# Patient Record
Sex: Female | Born: 1940 | Race: White | Hispanic: No
Health system: Southern US, Community
[De-identification: ages and names within clinical notes are randomized; demographics above are authoritative.]

## PROBLEM LIST (undated history)

## (undated) DIAGNOSIS — I499 Cardiac arrhythmia, unspecified: Secondary | ICD-10-CM

## (undated) DIAGNOSIS — I10 Essential (primary) hypertension: Secondary | ICD-10-CM

## (undated) DIAGNOSIS — K219 Gastro-esophageal reflux disease without esophagitis: Secondary | ICD-10-CM

## (undated) DIAGNOSIS — R112 Nausea with vomiting, unspecified: Secondary | ICD-10-CM

## (undated) DIAGNOSIS — Z8679 Personal history of other diseases of the circulatory system: Secondary | ICD-10-CM

## (undated) DIAGNOSIS — Z9889 Other specified postprocedural states: Secondary | ICD-10-CM

## (undated) DIAGNOSIS — E039 Hypothyroidism, unspecified: Secondary | ICD-10-CM

## (undated) DIAGNOSIS — E119 Type 2 diabetes mellitus without complications: Secondary | ICD-10-CM

## (undated) DIAGNOSIS — I4819 Other persistent atrial fibrillation: Secondary | ICD-10-CM

## (undated) DIAGNOSIS — E7800 Pure hypercholesterolemia, unspecified: Secondary | ICD-10-CM

## (undated) DIAGNOSIS — M545 Low back pain, unspecified: Secondary | ICD-10-CM

## (undated) DIAGNOSIS — F419 Anxiety disorder, unspecified: Secondary | ICD-10-CM

## (undated) DIAGNOSIS — I509 Heart failure, unspecified: Secondary | ICD-10-CM

## (undated) DIAGNOSIS — I519 Heart disease, unspecified: Secondary | ICD-10-CM

## (undated) DIAGNOSIS — G4733 Obstructive sleep apnea (adult) (pediatric): Secondary | ICD-10-CM

## (undated) DIAGNOSIS — F329 Major depressive disorder, single episode, unspecified: Secondary | ICD-10-CM

## (undated) DIAGNOSIS — I350 Nonrheumatic aortic (valve) stenosis: Secondary | ICD-10-CM

## (undated) DIAGNOSIS — G2581 Restless legs syndrome: Secondary | ICD-10-CM

## (undated) DIAGNOSIS — I428 Other cardiomyopathies: Secondary | ICD-10-CM

## (undated) DIAGNOSIS — R5383 Other fatigue: Secondary | ICD-10-CM

## (undated) DIAGNOSIS — M1990 Unspecified osteoarthritis, unspecified site: Secondary | ICD-10-CM

## (undated) DIAGNOSIS — Z9989 Dependence on other enabling machines and devices: Secondary | ICD-10-CM

## (undated) DIAGNOSIS — C4362 Malignant melanoma of left upper limb, including shoulder: Secondary | ICD-10-CM

## (undated) DIAGNOSIS — F32A Depression, unspecified: Secondary | ICD-10-CM

## (undated) DIAGNOSIS — G8929 Other chronic pain: Secondary | ICD-10-CM

## (undated) HISTORY — DX: Heart disease, unspecified: I51.9

## (undated) HISTORY — DX: Nonrheumatic aortic (valve) stenosis: I35.0

## (undated) HISTORY — DX: Other fatigue: R53.83

## (undated) HISTORY — DX: Other cardiomyopathies: I42.8

## (undated) HISTORY — DX: Restless legs syndrome: G25.81

## (undated) HISTORY — DX: Essential (primary) hypertension: I10

## (undated) HISTORY — PX: MELANOMA EXCISION: SHX5266

## (undated) HISTORY — DX: Hypothyroidism, unspecified: E03.9

## (undated) HISTORY — PX: DILATION AND CURETTAGE OF UTERUS: SHX78

## (undated) HISTORY — PX: CATARACT EXTRACTION W/ INTRAOCULAR LENS  IMPLANT, BILATERAL: SHX1307

---

## 1983-10-08 HISTORY — PX: VAGINAL HYSTERECTOMY: SUR661

## 1998-07-24 ENCOUNTER — Ambulatory Visit (HOSPITAL_COMMUNITY): Admission: RE | Admit: 1998-07-24 | Discharge: 1998-07-24 | Payer: Self-pay | Admitting: Family Medicine

## 1999-06-05 ENCOUNTER — Other Ambulatory Visit: Admission: RE | Admit: 1999-06-05 | Discharge: 1999-06-05 | Payer: Self-pay | Admitting: Family Medicine

## 1999-07-27 ENCOUNTER — Encounter: Payer: Self-pay | Admitting: Family Medicine

## 1999-07-27 ENCOUNTER — Ambulatory Visit (HOSPITAL_COMMUNITY): Admission: RE | Admit: 1999-07-27 | Discharge: 1999-07-27 | Payer: Self-pay | Admitting: Family Medicine

## 2000-06-11 ENCOUNTER — Other Ambulatory Visit: Admission: RE | Admit: 2000-06-11 | Discharge: 2000-06-11 | Payer: Self-pay | Admitting: Family Medicine

## 2001-06-26 ENCOUNTER — Other Ambulatory Visit: Admission: RE | Admit: 2001-06-26 | Discharge: 2001-06-26 | Payer: Self-pay | Admitting: Family Medicine

## 2002-06-18 ENCOUNTER — Other Ambulatory Visit: Admission: RE | Admit: 2002-06-18 | Discharge: 2002-06-18 | Payer: Self-pay | Admitting: Family Medicine

## 2003-04-07 ENCOUNTER — Ambulatory Visit (HOSPITAL_COMMUNITY): Admission: RE | Admit: 2003-04-07 | Discharge: 2003-04-07 | Payer: Self-pay | Admitting: Gastroenterology

## 2003-07-13 ENCOUNTER — Ambulatory Visit (HOSPITAL_COMMUNITY): Admission: RE | Admit: 2003-07-13 | Discharge: 2003-07-13 | Payer: Self-pay | Admitting: Family Medicine

## 2003-07-13 ENCOUNTER — Encounter: Payer: Self-pay | Admitting: Cardiology

## 2004-07-10 ENCOUNTER — Ambulatory Visit (HOSPITAL_BASED_OUTPATIENT_CLINIC_OR_DEPARTMENT_OTHER): Admission: RE | Admit: 2004-07-10 | Discharge: 2004-07-10 | Payer: Self-pay | Admitting: Family Medicine

## 2004-07-10 ENCOUNTER — Encounter: Payer: Self-pay | Admitting: Internal Medicine

## 2004-08-28 ENCOUNTER — Ambulatory Visit: Payer: Self-pay | Admitting: Pulmonary Disease

## 2004-10-07 ENCOUNTER — Emergency Department (HOSPITAL_COMMUNITY): Admission: AD | Admit: 2004-10-07 | Discharge: 2004-10-07 | Payer: Self-pay | Admitting: Family Medicine

## 2005-02-25 ENCOUNTER — Ambulatory Visit: Payer: Self-pay | Admitting: Pulmonary Disease

## 2005-06-21 ENCOUNTER — Ambulatory Visit: Payer: Self-pay | Admitting: Pulmonary Disease

## 2005-12-19 ENCOUNTER — Ambulatory Visit: Payer: Self-pay | Admitting: Pulmonary Disease

## 2006-06-05 ENCOUNTER — Encounter: Admission: RE | Admit: 2006-06-05 | Discharge: 2006-06-05 | Payer: Self-pay | Admitting: Family Medicine

## 2006-08-06 ENCOUNTER — Encounter: Admission: RE | Admit: 2006-08-06 | Discharge: 2006-08-06 | Payer: Self-pay | Admitting: Family Medicine

## 2006-09-18 ENCOUNTER — Encounter: Admission: RE | Admit: 2006-09-18 | Discharge: 2006-09-18 | Payer: Self-pay | Admitting: Family Medicine

## 2006-10-24 ENCOUNTER — Encounter: Admission: RE | Admit: 2006-10-24 | Discharge: 2006-10-24 | Payer: Self-pay | Admitting: Family Medicine

## 2006-12-19 ENCOUNTER — Ambulatory Visit: Payer: Self-pay | Admitting: Pulmonary Disease

## 2007-01-30 ENCOUNTER — Encounter: Admission: RE | Admit: 2007-01-30 | Discharge: 2007-01-30 | Payer: Self-pay | Admitting: Family Medicine

## 2007-06-09 ENCOUNTER — Ambulatory Visit: Payer: Self-pay | Admitting: Pulmonary Disease

## 2008-02-05 ENCOUNTER — Telehealth (INDEPENDENT_AMBULATORY_CARE_PROVIDER_SITE_OTHER): Payer: Self-pay | Admitting: *Deleted

## 2008-06-20 ENCOUNTER — Ambulatory Visit: Payer: Self-pay | Admitting: Pulmonary Disease

## 2008-06-20 DIAGNOSIS — G4733 Obstructive sleep apnea (adult) (pediatric): Secondary | ICD-10-CM | POA: Insufficient documentation

## 2008-06-20 DIAGNOSIS — G2581 Restless legs syndrome: Secondary | ICD-10-CM | POA: Insufficient documentation

## 2008-07-06 ENCOUNTER — Encounter: Payer: Self-pay | Admitting: Pulmonary Disease

## 2008-09-12 ENCOUNTER — Ambulatory Visit: Payer: Self-pay | Admitting: Pulmonary Disease

## 2008-11-09 ENCOUNTER — Encounter: Admission: RE | Admit: 2008-11-09 | Discharge: 2008-11-09 | Payer: Self-pay | Admitting: Family Medicine

## 2009-06-19 ENCOUNTER — Ambulatory Visit: Payer: Self-pay | Admitting: Pulmonary Disease

## 2009-09-04 ENCOUNTER — Encounter: Payer: Self-pay | Admitting: Pulmonary Disease

## 2009-11-29 ENCOUNTER — Telehealth: Payer: Self-pay | Admitting: Pulmonary Disease

## 2010-02-09 ENCOUNTER — Encounter: Admission: RE | Admit: 2010-02-09 | Discharge: 2010-02-09 | Payer: Self-pay | Admitting: Family Medicine

## 2010-06-28 ENCOUNTER — Telehealth: Payer: Self-pay | Admitting: Pulmonary Disease

## 2010-07-23 ENCOUNTER — Ambulatory Visit: Payer: Self-pay | Admitting: Pulmonary Disease

## 2010-07-24 LAB — CONVERTED CEMR LAB
Iron: 74 ug/dL (ref 42–145)
Saturation Ratios: 16.1 % — ABNORMAL LOW (ref 20.0–50.0)
Transferrin: 328.2 mg/dL (ref 212.0–360.0)

## 2010-08-30 ENCOUNTER — Encounter: Payer: Self-pay | Admitting: Pulmonary Disease

## 2010-11-08 NOTE — Miscellaneous (Signed)
Summary: negative apnea link screen  Clinical Lists Changes   apnea link shows ahi 5/hr, lowest sat 90% can go without cpap and see how she feels.  Appended Document: negative apnea link screen megan, let pt know that only 5/hr on apnea link.  She can come off cpap for a period of time and see how she feels.  let me know  Appended Document: negative apnea link screen called and spoke with pt.  informed pt of apnea link results and dr. Theodora Lalanne's recommendations that she can come off cpap for a period of time and to see how she feels.  informed pt to call our office and let us know how she is doing once off cpap.  pt verbalized understanding and denied any questions.

## 2010-11-08 NOTE — Progress Notes (Signed)
Summary: rx  Phone Note Call from Patient Call back at Home Phone [phone]   Caller: Patient Call For: clance Reason for Call: Talk to Nurse Summary of Call: kc gave pt Mirapex samples at last visit...need rx called in to [facility]. Initial call taken by: Eugene Gavia,  Feb 05, 2008 12:03 PM  Follow-up for Phone Call        per kc ok to call in mirapex but offer requip first because it comes in generic-requip 1mg  1 at bedtime #30 with as needed refills  lmtcb Philipp Deputy CMA  Feb 05, 2008 1:31 PM   Additional Follow-up for Phone Call Additional follow up Details #1::        Spoke with patient - she wants mirapex - dosage?? Marinus Maw  Feb 05, 2008 2:35 PM     New/Updated Medications: MIRAPEX 0.25 MG  TABS (PRAMIPEXOLE DIHYDROCHLORIDE) One tablet at bedtime.   Prescriptions: MIRAPEX 0.25 MG  TABS (PRAMIPEXOLE DIHYDROCHLORIDE) One tablet at bedtime.  #30 x 6   Entered by:   Marinus Maw   Authorized by:   Barbaraann Share MD   Signed by:   Marinus Maw on 02/05/2008   Method used:   Electronically sent to ...       [id number]       [phone]       [place], [place]  [address]       Ph: [phone]       Fax: [phone]   RxID:   [id number]

## 2010-11-08 NOTE — Progress Notes (Signed)
Summary: refill  Phone Note Call from Patient Call back at Home Phone [phone]   Caller: Patient Call For: [name] Reason for Call: Refill Medication Summary of Call: Wants refill on requip 1 mg (ropinirole hcl) , was getting it through [employer] (mail order), [facility] to [facility]//[phone]. Initial call taken by: Darletta Moll,  November 29, 2009 9:51 AM  Follow-up for Phone Call        pt requested 30 day supply with refills. rx sent. pt aware.  Carron Curie CMA  November 29, 2009 9:57 AM     Prescriptions: REQUIP 1 MG  TABS (ROPINIROLE HCL) At bedtime  #30 x 5   Entered by:   Carron Curie CMA   Authorized by:   Barbaraann Share MD   Signed by:   Carron Curie CMA on 11/29/2009   Method used:   [facility] [address] [facility]       [address]       [place], [place]  [address]       Ph: [phone] or [phone]       Fax: [phone]   RxID:   [id number]

## 2010-11-08 NOTE — Miscellaneous (Signed)
Summary: H1N1 Vaccine  Clinical Lists Changes  Orders: Added new Service order of H1N1 vaccine (E4540) - Signed Added new Service order of Influenza A (H1N1) w/ Phy couseling (J8119) - Signed Observations: Added new observation of H1N1 #1 VIS: 07/07/2008 (09/12/2008 14:29) Added new observation of H1N1 #1 EXP: 04/05/2009 (09/12/2008 14:29) Added new observation of H1N1 #1 LOT: [id number] (09/12/2008 14:29) Added new observation of H1N1 #1 MFR: [employer] (09/12/2008 14:29) Added new observation of H1N1 #1 RTE: IM (09/12/2008 14:29) Added new observation of H1N1 #1 SITE: Deltoid (09/12/2008 14:29) Added new observation of H1N1#1 VAC: Given (09/12/2008 14:29)Flu Vaccine Consent Questions     Do you have a history of severe allergic reactions to this vaccine? no    Any prior history of allergic reactions to egg and/or gelatin? no    Do you have a sensitivity to the preservative Thimersol? no    Do you have a past history of Guillan-Barre Syndrome? no    Do you currently have an acute febrile illness? no    Have you ever had a severe reaction to latex? no    Vaccine information given and explained to patient? yes    Are you currently pregnant? no   Do you have Asthma? no   Lot Number: [id number]   Exp Date:04/05/2009   Site Given  Deltoid R Cyndia Diver LPN  September 12, 2008 2:29 PM Added new observation of H1N1 #1 VIS: 07/07/2008 (09/12/2008 14:29) Added new observation of H1N1 #1 EXP: 04/05/2009 (09/12/2008 14:29) Added new observation of H1N1 #1 LOT: [id number] (09/12/2008 14:29) Added new observation of H1N1 #1 MFR: [employer]osmithkline (09/12/2008 14:29) Added new observation of H1N1 #1 RTE: IM (09/12/2008 14:29) Added new observation of H1N1 #1 SITE: Deltoid (09/12/2008 14:29) Added new observation of H1N1#1 VAC: Given (09/12/2008 14:29)  .h1n1

## 2010-11-08 NOTE — Miscellaneous (Signed)
Summary: optimal pressure is 12cm.  Clinical Lists Changes  Orders: Added new Referral order of DME Referral (DME) - Signed autodownload shows optimal pressure of 12-13, excellent compliance.

## 2010-11-08 NOTE — Assessment & Plan Note (Signed)
Summary: rov for osa, rls   Primary Provider/Referring Provider:  Hilts  CC:  1 year follow up, pt c/o leg jerks at night , requip not working, and compliant with cpap averages 6-8 hrs.  History of Present Illness: the pt comes in today for f/u of her known osa and RLS.  She is doing very well with cpap, and reports no issues.  She feels that it has helped her sleep and daytime alertness.  She is having no issue with mask or machine.  She also has RLS, and is being treated with requip.  She has been having breakthru leg symptoms, and they do sound like typical RLS symptoms.  She has not had a recent iron panel.   Preventive Screening-Counseling & Management  Alcohol-Tobacco     Smoking Status: never  Allergies: No Known Drug Allergies  Social History: Smoking Status:  never  Review of Systems       The patient complains of non-productive cough, anxiety, depression, and hand/feet swelling.  The patient denies shortness of breath with activity, shortness of breath at rest, productive cough, coughing up blood, chest pain, irregular heartbeats, acid heartburn, indigestion, loss of appetite, weight change, abdominal pain, difficulty swallowing, sore throat, tooth/dental problems, headaches, nasal congestion/difficulty breathing through nose, sneezing, itching, ear ache, joint stiffness or pain, rash, change in color of mucus, and fever.    Vital Signs:  Patient profile:   70 year old female Height:      66 inches Weight:      183 pounds BMI:     29.64 O2 Sat:      96 % on Room air Temp:     98.4 degrees F oral Pulse rate:   57 / minute BP sitting:   122 / 64  (left arm)  Vitals Entered By: Renold Genta RCP, LPN (July 23, 2010 2:42 PM)  O2 Flow:  Room air CC: 1 year follow up, pt c/o leg jerks at night , requip not working, compliant with cpap averages 6-8 hrs Comments Medications reviewed with patient Renold Genta RCP, LPN  July 23, 2010 2:47 PM    Physical  Exam  General:  ow female in nad Nose:  no skin breakdown or pressure necrosis from cpap mask Extremities:  no edema or cyanosis  Neurologic:  alert and oriented, does not appear sleepy. moves all 4 without deficit.   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  I have asked her to work on weight loss, and to keep up with mask changes and supplies.    Problem # 2:  RESTLESS LEGS SYNDROME (ICD-333.94) the pt is having breakthru leg symptoms despite taking requip, and her description does sound like RLS.  Will check an iron panel to see if that is the issue, and also try increasing her dopamine agonist dose.  She will let me know how things go.  Medications Added to Medication List This Visit: 1)  Requip 0.5 Mg Tabs (Ropinirole hcl) .... As directed 2)  Arthrotec 75-200 Mg-mcg Tabs (Diclofenac-misoprostol) .[name].. 1 two times a day  Other Orders: Est. Patient Level III (99213) TLB-IBC Pnl (Iron/FE;Transferrin) (83550-IBC)  Patient Instructions: 1)  increase requip to 0.5mg  in afternoon, then 1mg  after dinner.  if still having issues, increase to 1mg  in afternoon and after dinner.  let me know if still having issues. 2)  will check iron level today  3)  continue on cpap.   4)  followup with me in one year  Prescriptions: REQUIP 0.5 MG  TABS (ROPINIROLE HCL) as directed  #120 x 6   Entered and Authorized by:   Barbaraann Share MD   Signed by:   Barbaraann Share MD on 07/23/2010   Method used:   Print then Give to Patient   RxID:   [id number]

## 2010-11-08 NOTE — Assessment & Plan Note (Signed)
Summary: rov for osa, rls   Primary Provider/Referring Provider:  Hilts  CC:  Pt is here for a 1 yr f/u appt.  Pt states she is not using her cpap machine every night.  pt c/o pressure too high.  Pt denied any complaints with mask.  Pt c/o Requip causing her to feel "drugged up" in the morning.  [name]  History of Present Illness: The pt comes in today for f/u of her known mild osa and RLS.  She is taking requip for the latter, and her symptoms seem to be well controlled.  However, she is concerned the requip is causing "hangover effect" the next day.  She has tried taking 1/2 dose, and does better wrt sedation the next day.  She is wearing cpap off and on, but feels the  pressure is too high.  It has not been checked in awhile.  At the last visit, because her "apnea link" showed improved osa from her original study, I had asked her to come off cpap for awhile to see how she did.  She felt she did better with continuing on cpap.  Of note, her weight is up 15 pounds from last visit.  Current Medications (verified): 1)  Adult Aspirin Ec Low Strength 81 Mg Tbec (Aspirin) .... Once Daily 2)  Women's One A Day Multivitamin .... Once Daily 3)  Levothroid 112 Mcg Tabs (Levothyroxine Sodium) .... Once Daily 4)  Daypro 500 Mg Tabs (Oxaprozin) .... Take 1 Tablet By Mouth Two Times A Day 5)  Omeprazole 20 Mg Cpdr (Omeprazole) .... Take 1 Tablet By Mouth Two Times A Day 6)  Lovastatin 20 Mg Tabs (Lovastatin) .... Take 1 Tablet By Mouth Once A Day 7)  Fish Oil 1200 Mg Caps (Omega-3 Fatty Acids) .... Take 2 Tabs By Mouth Two Times A Day 8)  Cod Liver Oil  Caps (Cod Liver Oil) .... Take 3 Tabs By Mouth Daily 9)  Calcium .... Take 1 Tablet By Mouth Once A Day 10)  Acarbose 25 Mg Tabs (Acarbose) .... Take 1 Tablet By Mouth Three Times A Day 11)  Requip 1 Mg  Tabs (Ropinirole Hcl) .... At Bedtime 12)  Magnesium With Zinc .... 3 Tabs Two Times A Day  Allergies (verified): No Known Drug Allergies  Review of  Systems      See HPI  Vital Signs:  Patient profile:   70 year old female Height:      66 inches Weight:      185.13 pounds BMI:     29.99 O2 Sat:      100 % on Room air Temp:     97.8 degrees F oral Pulse rate:   56 / minute BP sitting:   130 / 70  (left arm) Cuff size:   regular  Vitals Entered By: Arman Filter LPN (June 19, 2009 1:30 PM)  O2 Flow:  Room air CC: Pt is here for a 1 yr f/u appt.  Pt states she is not using her cpap machine every night.  pt c/o pressure too high.  Pt denied any complaints with mask.  Pt c/o Requip causing her to feel "drugged up" in the morning.   Comments Medications reviewed with patient Arman Filter LPN  June 19, 2009 1:30 PM    Physical Exam  General:  ow female in nad Nose:  no skin breakdown or pressure necrosis from cpap mask Extremities:  no edema Neurologic:  alert, does not appear sleepy, moves all 4.  Impression & Recommendations:  Problem # 1:  RESTLESS LEGS SYNDROME (ICD-333.94) I have asked the pt to cut her requip in half to 0.5mg  at hs.  If she continues to have issues, would consider changing to mirapex, which has recently gone generic as well.  Problem # 2:  OBSTRUCTIVE SLEEP APNEA (ICD-327.23) She has not been wearing cpap compliantly, primarily due to "too much pressure".  I suspect her pressure needs haven't changed, but will recheck with autodevice for 2 weeks.  Medications Added to Medication List This Visit: 1)  Women's One A Day Multivitamin  .... Once daily 2)  Omeprazole 20 Mg Cpdr (Omeprazole) .... Take 1 tablet by mouth two times a day 3)  Fish Oil 1200 Mg Caps (Omega-3 fatty acids) .... Take 2 tabs by mouth two times a day 4)  Magnesium With Zinc  .... 3 tabs two times a day  Other Orders: Est. Patient Level III (16109) DME Referral (DME)  Patient Instructions: 1)  try cutting requip dose in half.  If symptoms worsen, or if you remain oversedated on the 0.5mg  dose, can consider changing you  to mirapex. 2)  will get auto titrating device for 2 weeks to re-optimize your pressure 3)  work on weight loss. 4)  followup with me in 12mos.

## 2010-11-08 NOTE — Assessment & Plan Note (Signed)
Summary: rov for osa and rls   Chief Complaint:  Pt is here for a yearly follow up.  Pt states she is not using her cpap machine at night.  Pt would like Rx for Requip instead of Mirapex due to costs..  History of Present Illness: the patient comes in today for followup of her obstructive sleep apnea and restless leg syndrome. She has lost about 35 pounds since the last visit, and is wondering whether she even still has sleep apnea. She has not been wearing her CPAP for the last month or so, and feels that she sleeps just as well without it. However, she has noted some sluggishness in the morning, and is wondering whether this has anything to do with her dopamine agonist for her restless leg syndrome. I have suggested to her that it may be related to discontinuation of CPAP. She is having no difficulties with her mask or pressure, and is having no breakthrough leg jerks on her dopamine agonist.        Review of Systems      See HPI   Vital Signs:  Patient Profile:   70 Years Old Female Weight:      170 pounds O2 Sat:      98 % O2 treatment:    Room Air Temp:     98.0 degrees F oral Pulse rate:   68 / minute BP sitting:   138 / 70  (right arm) Cuff size:   regular  Vitals Entered By: Cyndia Diver LPN (June 20, 2008 1:38 PM)             Comments Medications reviewed with patient Cyndia Diver LPN  June 20, 2008 1:38 PM      Physical Exam  General:     overweight female in no acute distress Nose:     no skin breakdown or pressure necrosis from the CPAP mask      Impression & Recommendations:  Problem # 1:  OBSTRUCTIVE SLEEP APNEA (ICD-327.23) the patient has lost 35 pounds, and may be able to go without CPAP. I have suggested to her that we try an apnea link device as a screening tool.  I will call her with results.  Problem # 2:  RESTLESS LEGS SYNDROME (ICD-333.94) controlled on meds.  I doubt her dopamine agonist is responsible for her sluggishness in  the am's.  Will see what the apnea link shows.  Medications Added to Medication List This Visit: 1)  Daypro 500 Mg Tabs (oxaprozin)  .... Take 1 tablet by mouth two times a day 2)  Detrol La 4 Mg Xr24h-cap (Tolterodine tartrate) .... Take 1 tablet by mouth once a day 3)  Lovastatin 20 Mg Tabs (Lovastatin) .... Take 1 tablet by mouth once a day 4)  Fish Oil 1200 Mg Caps (Omega-3 fatty acids) .... Take 1 tablet by mouth two times a day 5)  Cod Liver Oil Caps (Cod liver oil) .... Take 3 tabs by mouth daily 6)  Glucosamine-chondroitin  .... Take 1 tablet by mouth two times a day 7)  Calcium  .... Take 1 tablet by mouth once a day 8)  Niacin 500 Mg Tabs (Niacin) .... Take 1 tablet by mouth once a day 9)  Acarbose 25 Mg Tabs (Acarbose) .... Take 1 tablet by mouth three times a day 10)  Requip 1 Mg Tabs (Ropinirole hcl) .... At bedtime   Patient Instructions: 1)  will get apnea link to check for one night. 2)  will change mirapex to requip 1mg  for expense control 3)  f/u one year and as needed.   Prescriptions: REQUIP 1 MG  TABS (ROPINIROLE HCL) At bedtime  #30 x 6   Entered and Authorized by:   Barbaraann Share MD   Signed by:   Barbaraann Share MD on 06/20/2008   Method used:   Print then Give to Patient   RxID:   [id number]  ]

## 2010-11-08 NOTE — Letter (Signed)
Summary: CMN for PAP Device/[facility]  CMN for PAP Device/[facility]   Imported By: Sherian Rein 09/07/2010 08:29:26  _____________________________________________________________________  External Attachment:    Type:   Image     Comment:   External Document

## 2010-11-08 NOTE — Progress Notes (Signed)
Summary: nos appt  Phone Note Call from Patient   Caller: [email or web address]  Call For: Annabeth Tortora Summary of Call: Rsc nos from 9/21 to 10/17 @ 2:45p. Initial call taken by: Darletta Moll,  June 28, 2010 10:02 AM

## 2011-01-17 ENCOUNTER — Other Ambulatory Visit: Payer: Self-pay | Admitting: Family Medicine

## 2011-01-17 DIAGNOSIS — Z1231 Encounter for screening mammogram for malignant neoplasm of breast: Secondary | ICD-10-CM

## 2011-02-11 ENCOUNTER — Ambulatory Visit: Payer: Self-pay

## 2011-02-12 ENCOUNTER — Ambulatory Visit
Admission: RE | Admit: 2011-02-12 | Discharge: 2011-02-12 | Disposition: A | Discharge status: Home / Self Care | Payer: Medicare Other | Source: Ambulatory Visit | Attending: Family Medicine | Admitting: Family Medicine

## 2011-02-12 DIAGNOSIS — Z1231 Encounter for screening mammogram for malignant neoplasm of breast: Secondary | ICD-10-CM

## 2011-02-22 NOTE — Op Note (Signed)
   NAMEKIARALIZ, RAFUSE                            ACCOUNT NO.:  [id number]   MEDICAL RECORD NO.:  [id number]                   PATIENT TYPE:  AMB   LOCATION:  ENDO                                 FACILITY:  [facility]   PHYSICIAN:  Graylin Shiver, M.D.                DATE OF BIRTH:  [birth year]   DATE OF PROCEDURE:  04/07/2003  DATE OF DISCHARGE:                                 OPERATIVE REPORT   PROCEDURE:  Colonoscopy.   INDICATIONS FOR PROCEDURE:  Screening.   CONSENT:  Informed consent was obtained after explanation of the risks of  bleeding, infection, perforation.   PREMEDICATION:  Fentanyl 75 mcg IV, Versed 6 mg IV.   DESCRIPTION OF PROCEDURE:  With the patient in the left lateral decubitus  position, the rectal exam was performed.  No masses were felt.  The Olympus  colonoscope was inserted into the rectum and advanced around the colon to  the cecum.  Cecal landmarks were identified.  The cecum and ascending colon  were normal.  The transverse colon was normal.  The descending colon,  sigmoid and rectum were normal.  She tolerated the procedure well without  complications.   IMPRESSION:  Normal colonoscopy to the cecum.                                                 Graylin Shiver, M.D.    Germain Osgood  D:  04/07/2003  T:  04/07/2003  Job:  [id number]

## 2011-02-22 NOTE — Procedures (Signed)
[name], [name]                  ACCOUNT NO.:  [id number]   MEDICAL RECORD NO.:  [id number]          PATIENT TYPE:  OUT   LOCATION:  SLEEP CENTER                 FACILITY:  [facility]   PHYSICIAN:  Clinton D. Maple Hudson, M.D. DATE OF BIRTH:  May 25, 1941   DATE OF STUDY:  07/10/2004  DATE OF DISCHARGE:  07/10/2004                              NOCTURNAL POLYSOMNOGRAM   REFERRING PHYSICIAN:  Lillia Carmel, M.D.   INDICATION FOR THE STUDY:  Hypersomnia with sleep apnea.   EPWORTH SLEEPINESS SCORE:  15/24   NECK SIZE:  15 inches   BODY MASS INDEX:  30   WEIGHT:  192 pounds   SLEEP ARCHITECTURE:  Total sleep time 376 minutes with sleep efficiency 78%,  stage I was 9%, stage II 70%, stages III and IV 6%, REM was 15% of total  sleep time.  Sleep latency 52 minutes, REM latency 200 minutes, awake after  sleep onset 54 minutes, arousal index 53 which is increased reflecting  mostly leg jerks.  No sleep meds were taken.   RESPIRATORY DATA:  Split-study protocol.  RDI 17/hr indicating mild to  moderate obstructive sleep apnea/hypopnea syndrome before CPAP titration.  This included 1 obstructive apnea and 41 hypopneas.  Most events were while  sleeping supine.  REM RDI was absent.  CPAP was titrated to 11 CWP, RDI  1.1/hr using a Respironics ComfortGel Nasal Mask with humidifier.   OXYGEN DATA:  Moderate to loud snoring with desaturation to a nadir of 88%  before CPAP.  After CPAP titration mean oxygen saturation held 95% on room  air.   CARDIAC DATA:  Normal sinus rhythm with one interval of apparent PAT 7 beats  long occurring in stage II sleep.  Rhythm was otherwise unremarkable.   MOVEMENT/PARASOMNIA:  A total of 613 limb jerks were recorded of which 85  were associated with arousal or awakening for a periodic limb movement with  arousal index of 13.6/hr which is significantly elevated.   IMPRESSION:  Mild to moderate obstructive sleep apnea/hypopnea syndrome,  respiratory disturbance  index 17/hr with desaturation to 88%.  Successful  continuous positive airway pressure control at 11 CWP, respiratory  disturbance index 1.1/hr using a Respironics ComfortGel Nasal Mask with  heated humidifier.  Periodic limb  movement with arousal syndrome, 13.6/hr.  This may benefit from a trial of  specific therapy with clonazepam or Requip.      CDY/MEDQ  D:  07/15/2004 10:08:44  T:  07/15/2004 74:25:95  Job:  [id number]

## 2011-05-08 ENCOUNTER — Other Ambulatory Visit: Payer: Self-pay | Admitting: Pulmonary Disease

## 2011-05-10 ENCOUNTER — Telehealth: Payer: Self-pay | Admitting: Pulmonary Disease

## 2011-05-10 MED ORDER — ROPINIROLE HCL 0.5 MG PO TABS: ORAL_TABLET | ORAL | Status: DC

## 2011-05-10 NOTE — Telephone Encounter (Signed)
Ok to send refills for a year.

## 2011-05-10 NOTE — Telephone Encounter (Signed)
KC please advise if ok to send refill for the requip---pt has appt with you in oct. 2012.

## 2011-05-10 NOTE — Telephone Encounter (Signed)
Requip Rx was last filled by [facility] for #120 x 6.  Rx called into [facility] -- pt aware.

## 2011-07-18 ENCOUNTER — Encounter: Payer: Self-pay | Admitting: Pulmonary Disease

## 2011-07-22 ENCOUNTER — Ambulatory Visit (INDEPENDENT_AMBULATORY_CARE_PROVIDER_SITE_OTHER): Payer: Medicare Other | Admitting: Pulmonary Disease

## 2011-07-22 ENCOUNTER — Encounter: Payer: Self-pay | Admitting: Pulmonary Disease

## 2011-07-22 DIAGNOSIS — G4733 Obstructive sleep apnea (adult) (pediatric): Secondary | ICD-10-CM

## 2011-07-22 DIAGNOSIS — Z23 Encounter for immunization: Secondary | ICD-10-CM

## 2011-07-22 DIAGNOSIS — G2581 Restless legs syndrome: Secondary | ICD-10-CM

## 2011-07-22 NOTE — Assessment & Plan Note (Signed)
The patient is doing well from a sleep apnea standpoint.  She is wearing her CPAP compliantly, and reports no issues with her mask fit or pressure.  I have encouraged her to work aggressively on weight loss.

## 2011-07-22 NOTE — Progress Notes (Signed)
  Subjective:    Patient ID: Jenna [name], female    DOB: December 18, 1940, 70 y.o.   MRN: [id number]  HPI The patient comes in today for followup of her known sleep apnea and restless leg syndrome.  She is wearing CPAP compliantly, and is having no issues with tolerance.  She feels that she is sleeping reasonably well, and has acceptable daytime alertness.  She is continuing on her dopamine agonists for her restless legs, but does occasionally have breakthrough which requires an extra dose.   Review of Systems  Constitutional: Negative for fever and unexpected weight change.  HENT: Negative for ear pain, nosebleeds, congestion, sore throat, rhinorrhea, sneezing, trouble swallowing, dental problem, postnasal drip and sinus pressure.   Eyes: Negative for redness and itching.  Respiratory: Negative for cough, chest tightness, shortness of breath and wheezing.   Cardiovascular: Positive for leg swelling. Negative for palpitations.  Gastrointestinal: Negative for nausea and vomiting.  Genitourinary: Negative for dysuria.  Musculoskeletal: Negative for joint swelling.  Skin: Negative for rash.  Neurological: Negative for headaches.  Hematological: Bruises/bleeds easily.  Psychiatric/Behavioral: Negative for dysphoric mood. The patient is not nervous/anxious.        Objective:   Physical Exam Overweight female in no acute distress No skin breakdown or pressure necrosis from the CPAP mask Lower extremities without edema, no cyanosis noted Alert, does not appear sleepy, moves all 4 extremities.       Assessment & Plan:

## 2011-07-22 NOTE — Assessment & Plan Note (Signed)
The patient is on Requip for her restless leg syndrome, and is doing fairly well overall.  I stressed the importance of exercise as an adjunct to drug therapy.

## 2011-07-22 NOTE — Patient Instructions (Signed)
Work on [employer] loss Continue with cpap No change in meds for RLS followup with me in one year Will give you a flu shot today.

## 2011-08-16 ENCOUNTER — Other Ambulatory Visit: Payer: Self-pay | Admitting: Pulmonary Disease

## 2011-08-16 DIAGNOSIS — G4733 Obstructive sleep apnea (adult) (pediatric): Secondary | ICD-10-CM

## 2012-01-21 ENCOUNTER — Other Ambulatory Visit: Payer: Self-pay | Admitting: Family Medicine

## 2012-01-21 DIAGNOSIS — Z1231 Encounter for screening mammogram for malignant neoplasm of breast: Secondary | ICD-10-CM

## 2012-02-13 ENCOUNTER — Ambulatory Visit: Payer: Medicare Other

## 2012-02-18 ENCOUNTER — Ambulatory Visit
Admission: RE | Admit: 2012-02-18 | Discharge: 2012-02-18 | Disposition: A | Discharge status: Home / Self Care | Payer: Medicare Other | Source: Ambulatory Visit | Attending: Family Medicine | Admitting: Family Medicine

## 2012-02-18 DIAGNOSIS — Z1231 Encounter for screening mammogram for malignant neoplasm of breast: Secondary | ICD-10-CM

## 2012-02-18 HISTORY — PX: TRANSTHORACIC ECHOCARDIOGRAM: SHX275

## 2012-04-20 ENCOUNTER — Other Ambulatory Visit: Payer: Self-pay | Admitting: Pulmonary Disease

## 2012-05-13 HISTORY — PX: OTHER SURGICAL HISTORY: SHX169

## 2012-07-21 ENCOUNTER — Encounter: Payer: Self-pay | Admitting: Pulmonary Disease

## 2012-07-21 ENCOUNTER — Ambulatory Visit (INDEPENDENT_AMBULATORY_CARE_PROVIDER_SITE_OTHER): Payer: Medicare Other | Admitting: Pulmonary Disease

## 2012-07-21 VITALS — BP 122/72 | HR 62 | Temp 98.1°F | Ht 66.5 in | Wt 207.2 lb

## 2012-07-21 DIAGNOSIS — G4733 Obstructive sleep apnea (adult) (pediatric): Secondary | ICD-10-CM

## 2012-07-21 DIAGNOSIS — G2581 Restless legs syndrome: Secondary | ICD-10-CM

## 2012-07-21 NOTE — Patient Instructions (Addendum)
Continue with cpap, and keep up with mask changes and supplies. Work on weight loss followup with me in one year if doing well.  

## 2012-07-21 NOTE — Assessment & Plan Note (Signed)
The patient feels that her leg movements are controlled as long as she sticks with her medications.  I have also stressed to her how exercise can help with RLS symptoms as well.

## 2012-07-21 NOTE — Progress Notes (Signed)
  Subjective:    Patient ID: Jenna [name], female    DOB: 05-28-1941, 71 y.o.   MRN: [id number]  HPI The patient comes in today for followup of her known obstructive sleep apnea.  She is wearing CPAP compliantly, and is having no issues with her mask fit or pressure.  She feels that she sleeps well with the device, and is satisfied with her daytime alertness.   Review of Systems  Constitutional: Negative for fever and unexpected weight change.  HENT: Negative for ear pain, nosebleeds, congestion, sore throat, rhinorrhea, sneezing, trouble swallowing, dental problem, postnasal drip and sinus pressure.   Eyes: Negative for redness and itching.  Respiratory: Negative for cough, chest tightness, shortness of breath and wheezing.   Cardiovascular: Positive for leg swelling. Negative for palpitations.  Gastrointestinal: Negative for nausea and vomiting.  Genitourinary: Negative for dysuria.  Musculoskeletal: Negative for joint swelling.  Skin: Negative for rash.  Neurological: Negative for headaches.  Hematological: Does not bruise/bleed easily.  Psychiatric/Behavioral: Positive for dysphoric mood ( loss of husband). The patient is not nervous/anxious.        Objective:   Physical Exam Overweight female in no acute distress No skin breakdown or pressure necrosis from the CPAP mask Lower extremities without edema, no cyanosis Alert, does not appear to be sleepy, moves all 4 extremities.       Assessment & Plan:

## 2012-07-21 NOTE — Assessment & Plan Note (Signed)
The patient is doing well with CPAP, and feels that it is helping her sleep and daytime alertness.  I've asked her to keep up with mask changes and supplies, and to work aggressively on weight loss.

## 2012-10-06 ENCOUNTER — Other Ambulatory Visit: Payer: Self-pay | Admitting: *Deleted

## 2012-10-06 MED ORDER — ROPINIROLE HCL 0.5 MG PO TABS: 0.5000 mg | ORAL_TABLET | ORAL | Status: DC

## 2013-01-11 ENCOUNTER — Other Ambulatory Visit: Payer: Self-pay

## 2013-01-11 DIAGNOSIS — Z1231 Encounter for screening mammogram for malignant neoplasm of breast: Secondary | ICD-10-CM

## 2013-02-22 ENCOUNTER — Ambulatory Visit: Payer: Medicare Other

## 2013-03-10 ENCOUNTER — Ambulatory Visit
Admission: RE | Admit: 2013-03-10 | Discharge: 2013-03-10 | Disposition: A | Discharge status: Home / Self Care | Payer: Medicare Other | Source: Ambulatory Visit

## 2013-03-10 DIAGNOSIS — Z1231 Encounter for screening mammogram for malignant neoplasm of breast: Secondary | ICD-10-CM

## 2013-03-13 ENCOUNTER — Other Ambulatory Visit: Payer: Self-pay | Admitting: Pulmonary Disease

## 2013-07-21 ENCOUNTER — Ambulatory Visit (INDEPENDENT_AMBULATORY_CARE_PROVIDER_SITE_OTHER): Payer: Medicare Other | Admitting: Pulmonary Disease

## 2013-07-21 ENCOUNTER — Encounter: Payer: Self-pay | Admitting: Pulmonary Disease

## 2013-07-21 VITALS — BP 118/62 | HR 56 | Temp 97.9°F | Ht 66.0 in | Wt 221.8 lb

## 2013-07-21 DIAGNOSIS — G4733 Obstructive sleep apnea (adult) (pediatric): Secondary | ICD-10-CM

## 2013-07-21 NOTE — Assessment & Plan Note (Signed)
The patient is doing well overall with CPAP, but she has gained 14 pounds since the last visit and thinks she may be having breakthrough events.  She recently got new nasal pillows.  We will use the automatic setting and her device to optimize her pressure again, and we'll let her know the optimal pressure once we receive her download.  I have also stressed to her the importance of aggressive weight loss.

## 2013-07-21 NOTE — Progress Notes (Signed)
  Subjective:    Patient ID: Jenna [name], female    DOB: September 27, 1941, 72 y.o.   MRN: [id number]  HPI Patient comes in today for followup of her obstructive sleep apnea.  She is wearing CPAP compliantly, and is having no issues with her mask fit or pressure.  She does think that she is having breakthrough events at times, and her weight is up 14 pounds from the last visit.  She feels that her alertness during the day however is adequate.   Review of Systems  Constitutional: Negative for fever and unexpected weight change.  HENT: Negative for congestion, dental problem, ear pain, nosebleeds, postnasal drip, rhinorrhea, sinus pressure, sneezing, sore throat and trouble swallowing.   Eyes: Negative for redness and itching.  Respiratory: Positive for shortness of breath ( with exertion). Negative for cough, chest tightness and wheezing.   Cardiovascular: Negative for palpitations and leg swelling.  Gastrointestinal: Negative for nausea and vomiting.  Genitourinary: Negative for dysuria.  Musculoskeletal: Negative for joint swelling.  Skin: Negative for rash.  Neurological: Negative for headaches.  Hematological: Does not bruise/bleed easily.  Psychiatric/Behavioral: Negative for dysphoric mood. The patient is not nervous/anxious.        Objective:   Physical Exam Overweight female in no acute distress Nose without purulence or discharge noted No skin breakdown or pressure necrosis from the CPAP mask Lower extremities with mild edema, no cyanosis Alert, does not appear to be sleepy, moves all 4 extremities.       Assessment & Plan:

## 2013-07-21 NOTE — Patient Instructions (Signed)
Continue with cpap, and keep up with cushion changes. Work on [employer] loss and exercise program Will put your machine on the auto setting for a few weeks, and will call you once I receive your download.  followup with me in one year if doing well.

## 2013-07-22 ENCOUNTER — Encounter: Payer: Self-pay | Admitting: Internal Medicine

## 2013-07-22 ENCOUNTER — Ambulatory Visit (INDEPENDENT_AMBULATORY_CARE_PROVIDER_SITE_OTHER): Payer: Medicare Other | Admitting: Internal Medicine

## 2013-07-22 VITALS — BP 146/70 | HR 60 | Ht 66.5 in | Wt 224.8 lb

## 2013-07-22 DIAGNOSIS — I350 Nonrheumatic aortic (valve) stenosis: Secondary | ICD-10-CM

## 2013-07-22 DIAGNOSIS — I359 Nonrheumatic aortic valve disorder, unspecified: Secondary | ICD-10-CM

## 2013-07-22 DIAGNOSIS — E039 Hypothyroidism, unspecified: Secondary | ICD-10-CM

## 2013-07-22 DIAGNOSIS — I480 Paroxysmal atrial fibrillation: Secondary | ICD-10-CM

## 2013-07-22 DIAGNOSIS — I10 Essential (primary) hypertension: Secondary | ICD-10-CM

## 2013-07-22 DIAGNOSIS — I4891 Unspecified atrial fibrillation: Secondary | ICD-10-CM

## 2013-07-22 NOTE — Patient Instructions (Signed)
Your physician wants you to follow-up in: 1 year. You will receive a reminder letter in the mail two months in advance. If you don't receive a letter, please call our office to schedule the follow-up appointment.  Please have your lab work faxed to our office [phone])

## 2013-07-26 ENCOUNTER — Encounter: Payer: Self-pay | Admitting: Internal Medicine

## 2013-07-26 DIAGNOSIS — I359 Nonrheumatic aortic valve disorder, unspecified: Secondary | ICD-10-CM | POA: Insufficient documentation

## 2013-07-26 DIAGNOSIS — E039 Hypothyroidism, unspecified: Secondary | ICD-10-CM | POA: Insufficient documentation

## 2013-07-26 DIAGNOSIS — I350 Nonrheumatic aortic (valve) stenosis: Secondary | ICD-10-CM | POA: Insufficient documentation

## 2013-07-26 DIAGNOSIS — I480 Paroxysmal atrial fibrillation: Secondary | ICD-10-CM | POA: Insufficient documentation

## 2013-07-26 DIAGNOSIS — M1990 Unspecified osteoarthritis, unspecified site: Secondary | ICD-10-CM | POA: Insufficient documentation

## 2013-07-26 DIAGNOSIS — I10 Essential (primary) hypertension: Secondary | ICD-10-CM | POA: Insufficient documentation

## 2013-07-26 DIAGNOSIS — K219 Gastro-esophageal reflux disease without esophagitis: Secondary | ICD-10-CM | POA: Insufficient documentation

## 2013-07-26 DIAGNOSIS — F419 Anxiety disorder, unspecified: Secondary | ICD-10-CM | POA: Insufficient documentation

## 2013-07-26 NOTE — Progress Notes (Signed)
OFFICE NOTE  Chief Complaint:  Routine followup  Primary Care Physician: Eartha Inch, MD  HPI:  Jenna [name] is a 72 year old female with a history of mild aortic stenosis and a negative nuclear stress test in August 2013. Recently she had an episode of palpitations which she felt lasted up to about 10 minutes. During that time she felt a decrease in exercise tolerance with fatigue and no energy. This was concerning for AFib; however, she wore a monitor for a short period of time and it did not show that.  I set her up for a 30-day monitor, which she worse very faithfully between February 26 and December 31, 2012. The results indicated 1 very brief episode of AFib of less than 10 seconds in duration out of a total of over 40,000 seconds of monitoring. This correlates with a burden of less than 0.003%. At this point, given her low burden I feel that it is still reasonable to treat her with low-dose aspirin. However, we will continue to see her closely and adjust her anticoagulation as necessary. Since her last follow-up she denies any significant palpitations. She has reported some LE swelling, which she thinks is better now that she is off of her ARB?  PMHx:  Past Medical History  Diagnosis Date  . RLS (restless legs syndrome)   . OSA (obstructive sleep apnea)   . Palpitations   . Fatigue   . Mild aortic stenosis   . Hypertension   . Hypothyroidism     Past Surgical History  Procedure Laterality Date  . Lexiscan myoview  05/13/2012    No ECG changes. EKG negative for ischemia. No significant ischemia demonstrated.  . Transthoracic echocardiogram  02/18/2012    EF >55%, mild aortic stenosis    FAMHx:  Family History  Problem Relation Age of Onset  . Hypertension Mother   . Diabetes Mother   . Heart failure Father   . Arrhythmia Sister   . Hyperlipidemia Sister   . Hypertension Sister   . Healthy Brother   . Stroke Paternal Grandmother   . Cancer Paternal Grandfather   .  Healthy Daughter   . Hypertension Son   . Hyperlipidemia Son     SOCHx:   reports that she has never smoked. She has never used smokeless tobacco. She reports that she does not drink alcohol or use illicit drugs.  ALLERGIES:  Allergies  Allergen Reactions  . Crestor [Rosuvastatin]     Joints ached    ROS: A comprehensive review of systems was negative except for: Cardiovascular: positive for irregular heart beat and lower extremity edema  HOME MEDS: Current Outpatient Prescriptions  Medication Sig Dispense Refill  . acarbose (PRECOSE) 25 MG tablet Take 25 mg by mouth 3 (three) times daily.        [name] ALPRAZolam (XANAX) 0.25 MG tablet Take 1 tablet by mouth Twice daily.      [name] aspirin 81 MG tablet Take 81 mg by mouth daily.        . Calcium Carbonate-Vitamin D (CALCIUM + D PO) Take 600 mg by mouth 2 (two) times daily. Once a day      . CHERRY CONCENTRATE PO Take 4 oz by mouth 2 (two) times daily.      . EUFLEXXA 20 MG/2ML SOLN NOV 6, 13, 20      . Ginger, Zingiber officinalis, (GINGER PO) Take 1 capsule by mouth 3 (three) times daily.      [name] KLOR-CON M20  20 MEQ tablet Take 1 tablet by mouth Twice daily.      [name] levothyroxine (SYNTHROID) 100 MCG tablet Take 100 mcg by mouth daily.      [name] MAGNESIUM PO Take 1 tablet by mouth 2 (two) times daily.       . metoprolol tartrate (LOPRESSOR) 25 MG tablet Take 1 tablet by mouth Twice daily.      . Multiple Vitamins-Calcium (ONE-A-DAY WOMENS PO) Once a day       . Multiple Vitamins-Minerals (ICAPS AREDS FORMULA PO) Take 1 tablet by mouth 2 (two) times daily.      . naproxen sodium (ANAPROX) 220 MG tablet Take 220 mg by mouth 2 (two) times daily with a meal.      . Omega-3 Fatty Acids (FISH OIL) 1200 MG CAPS Take 2 at breakfast, 1 at lunch and 2 at supper      . omeprazole (PRILOSEC) 20 MG capsule Take 20 mg by mouth 2 (two) times daily.        [name] rOPINIRole (REQUIP) 0.5 MG tablet TAKE 1 TABLET AS NEEDED  120 tablet  3  . torsemide (DEMADEX) 5 MG  tablet Take 5-10 mg by mouth daily.       . TURMERIC PO Take 1 tablet by mouth daily.       No current facility-administered medications for this visit.    LABS/IMAGING: No results found for this or any previous visit (from the past 48 hour(s)). No results found.  VITALS: BP 146/70  Pulse 60  Ht 5' 6.5" (1.689 m)  Wt 224 lb 12.8 oz (101.969 kg)  BMI 35.74 kg/m2  EXAM: General appearance: alert and no distress Neck: no adenopathy, no carotid bruit, no JVD, supple, symmetrical, trachea midline and thyroid not enlarged, symmetric, no tenderness/mass/nodules Lungs: clear to auscultation bilaterally Heart: regular rate and rhythm, S1, S2 normal, 2-3/6 mid-peaking aortic murmur at RUSB Abdomen: soft, non-tender; bowel sounds normal; no masses,  no organomegaly Extremities: extremities normal, atraumatic, no cyanosis or edema Pulses: 2+ and symmetric Skin: Skin color, texture, turgor normal. No rashes or lesions Neurologic: Grossly normal Psych: Normal  EKG: Normal sinus rhythm at 60, low voltage QRS  ASSESSMENT: 1. Paroxysmal atrial fibrillation - low burden, on aspirin 2. HTN - controlled 3. Dyslipidemia - not on a statin, intolerant to Crestor 4. Mild aortic stenosis - unchanged by exam  PLAN: 1.  Mrs. Durkin is doing well without recurrent palpitations. Her a-fib burden appears to be low and she is symptomatic, and therefore a good historian for recurrence. Continue on aspirin. May need to revisit statin therapy as she does have mild AS and aggressive cholesterol control may help slow the progression of valvular heart disease.   Chrystie Nose, MD, [facility] Attending Cardiologist [facility]  Kellee Sittner C 07/26/2013, 1:52 PM

## 2013-08-20 ENCOUNTER — Telehealth: Payer: Self-pay | Admitting: *Deleted

## 2013-08-20 ENCOUNTER — Telehealth: Payer: Self-pay | Admitting: Pulmonary Disease

## 2013-08-20 DIAGNOSIS — G4733 Obstructive sleep apnea (adult) (pediatric): Secondary | ICD-10-CM

## 2013-08-20 NOTE — Telephone Encounter (Signed)
Let her know do not have download as of yet.  If we can get, would gladly read and comment. Weak legs is not a symptom I have heard about requip or that class of drugs.  Only way to know for sure is to stop it for a week or so and see if it changes.  If so, then rechallenge with either the requip or something else.  Give me some feedback in 1-2 weeks to see if leg issues improve.

## 2013-08-20 NOTE — Telephone Encounter (Signed)
Called, spoke with pt.  Informed her of below per [facility].  She verbalized understanding of this.  Pt is in agreement with trial off requip and will call us back with a report.    Dr. Shelle Iron, per another msg in pt's chart, auto download was placed in your green folder today for review on this patient.  Please advise.  Thank you.

## 2013-08-20 NOTE — Telephone Encounter (Signed)
AUTO set D/L received. In Fryda Molenda folder for review.

## 2013-08-20 NOTE — Telephone Encounter (Signed)
Duplicate message. 

## 2013-08-20 NOTE — Telephone Encounter (Signed)
Let her know that her optimal pressure is 13cm.  Please send an order to her dme to set her machine on this. Let her know there was some mask leak, and to make sure it is fitting ok.

## 2013-08-20 NOTE — Telephone Encounter (Signed)
Called, spoke with pt.  Reports she has noticed weakness in both legs x "a while" now.  Reports she forgot to ask [name] about this during her last OV.  Pt has read about weakness being a side effect from requip and would like know if he thinks this is what is going on.  She would like to know if he thinks she should stay on the requip or change to something else.  If she should stay on it, she only has 2 days left, so she will need a rx - [facility].   Also, pt would like results of download.  Dr. Shelle Iron, pls advise.  Thank you.   ** Pt requesting we call her back on her home # first.  If she doesn't answer, please try her cell phone.  She will be going to [employer] later.

## 2013-08-23 MED ORDER — ROPINIROLE HCL 0.5 MG PO TABS: ORAL_TABLET | ORAL | Status: DC

## 2013-08-23 NOTE — Telephone Encounter (Signed)
Pt aware of recs. rx sent. Nothing further needed 

## 2013-08-23 NOTE — Telephone Encounter (Signed)
Explain to her that exercise does treat restless legs.  I think that is a good idea.  If she is going to skip afternoon dose, she should take the evening dose after dinner.  Ok to call in usual requip, since I suspect she is going to have issues and have to go back on current dosing.

## 2013-08-23 NOTE — Telephone Encounter (Signed)
Pt is aware of recs. Order sent   Pt reports on Friday night she had to take 1/4 of her requip d/t her RLS. She is going to start exercise her legs to see if this will help. She still wants to continue on the requip. She has been taking this medication 1 in the afternoon and 2 at bedtime. She is going to try just taking 1 tablet at bedtime. requip last refilled 03/13/13 #120 x 3 refills. Pt is still wanting same directions and quantity called in for her. Please advise KC thanks

## 2013-09-28 ENCOUNTER — Telehealth: Payer: Self-pay | Admitting: Internal Medicine

## 2013-09-28 NOTE — Telephone Encounter (Signed)
There is no indication for SBE prophylaxis for this pt.  Corine Shelter PA-C 09/28/2013 3:52 PM

## 2013-09-28 NOTE — Telephone Encounter (Signed)
Forward to Luke Kilroy PA 

## 2013-09-28 NOTE — Telephone Encounter (Signed)
Wants to know if she still needs to be pre-medicated before any dental work of cleaning .[name] Please Call    Thanks

## 2013-09-28 NOTE — Telephone Encounter (Signed)
Notified patient no pre-med is needed per Corine Shelter  Pt verbalized understanding.

## 2014-02-02 ENCOUNTER — Other Ambulatory Visit: Payer: Self-pay

## 2014-02-02 DIAGNOSIS — Z1231 Encounter for screening mammogram for malignant neoplasm of breast: Secondary | ICD-10-CM

## 2014-02-03 ENCOUNTER — Other Ambulatory Visit: Payer: Self-pay | Admitting: Internal Medicine

## 2014-02-03 NOTE — Telephone Encounter (Signed)
Rx was sent to pharmacy electronically. 

## 2014-03-11 ENCOUNTER — Encounter (INDEPENDENT_AMBULATORY_CARE_PROVIDER_SITE_OTHER): Payer: Self-pay

## 2014-03-11 ENCOUNTER — Ambulatory Visit
Admission: RE | Admit: 2014-03-11 | Discharge: 2014-03-11 | Disposition: A | Discharge status: Home / Self Care | Payer: Medicare Other | Source: Ambulatory Visit

## 2014-03-11 DIAGNOSIS — Z1231 Encounter for screening mammogram for malignant neoplasm of breast: Secondary | ICD-10-CM

## 2014-04-15 ENCOUNTER — Other Ambulatory Visit: Payer: Self-pay | Admitting: Pulmonary Disease

## 2014-04-29 ENCOUNTER — Other Ambulatory Visit: Payer: Self-pay | Admitting: Family Medicine

## 2014-04-29 DIAGNOSIS — M545 Low back pain: Secondary | ICD-10-CM

## 2014-05-02 ENCOUNTER — Ambulatory Visit
Admission: RE | Admit: 2014-05-02 | Discharge: 2014-05-02 | Disposition: A | Discharge status: Home / Self Care | Payer: Medicare Other | Source: Ambulatory Visit | Attending: Family Medicine | Admitting: Family Medicine

## 2014-05-02 DIAGNOSIS — M545 Low back pain: Secondary | ICD-10-CM

## 2014-05-02 MED ORDER — GADOBENATE DIMEGLUMINE 529 MG/ML IV SOLN
20.0000 mL | Freq: Once | INTRAVENOUS | Status: AC | PRN
  Administered 2014-05-02: 20 mL via INTRAVENOUS

## 2014-06-15 ENCOUNTER — Telehealth: Payer: Self-pay | Admitting: Pulmonary Disease

## 2014-06-15 ENCOUNTER — Other Ambulatory Visit: Payer: Self-pay | Admitting: Pulmonary Disease

## 2014-06-15 NOTE — Telephone Encounter (Signed)
Refill sent. Please advise the pt. Dushore Bing, CMA

## 2014-06-15 NOTE — Telephone Encounter (Signed)
lmom to make the pt aware that the rx has been sent to her pharmacy.  Nothing further is needed.

## 2014-07-18 ENCOUNTER — Encounter: Payer: Self-pay | Admitting: Pulmonary Disease

## 2014-07-18 ENCOUNTER — Ambulatory Visit (INDEPENDENT_AMBULATORY_CARE_PROVIDER_SITE_OTHER): Payer: Medicare Other | Admitting: Pulmonary Disease

## 2014-07-18 VITALS — BP 122/74 | HR 56 | Temp 98.2°F | Ht 66.0 in | Wt 213.2 lb

## 2014-07-18 DIAGNOSIS — G4733 Obstructive sleep apnea (adult) (pediatric): Secondary | ICD-10-CM

## 2014-07-18 DIAGNOSIS — G2581 Restless legs syndrome: Secondary | ICD-10-CM

## 2014-07-18 DIAGNOSIS — Z23 Encounter for immunization: Secondary | ICD-10-CM

## 2014-07-18 NOTE — Assessment & Plan Note (Signed)
The patient is doing well with Requip in varying doses, depending upon the severity of her symptoms.

## 2014-07-18 NOTE — Patient Instructions (Signed)
Continue on cpap, and keep up with mask changes and supplies. Keep working on weight loss Will give you the flu shot today. Continue on requip. followup with me again in one year.

## 2014-07-18 NOTE — Progress Notes (Signed)
   Subjective:    Patient ID: Jenna [name], female    DOB: Jul 20, 1941, 73 y.o.   MRN: [id number]  HPI Patient comes in today for followup of her obstructive sleep apnea. She is wearing CPAP compliantly, and is having no issues with her mask fit or pressure. She feels that she sleeps reasonably well at night, and has no issues with daytime sleepiness. She also continues on Requip for her RLS, and feels this adequately controls her symptoms. Of note, her weight is down 8 pounds from the last visit.   Review of Systems  Constitutional: Negative for fever and unexpected weight change.  HENT: Negative for congestion, dental problem, ear pain, nosebleeds, postnasal drip, rhinorrhea, sinus pressure, sneezing, sore throat and trouble swallowing.   Eyes: Negative for redness and itching.  Respiratory: Negative for cough, chest tightness, shortness of breath and wheezing.   Cardiovascular: Negative for palpitations and leg swelling.  Gastrointestinal: Negative for nausea and vomiting.  Genitourinary: Negative for dysuria.  Musculoskeletal: Negative for joint swelling.  Skin: Negative for rash.  Neurological: Negative for headaches.  Hematological: Does not bruise/bleed easily.  Psychiatric/Behavioral: Negative for dysphoric mood. The patient is not nervous/anxious.        Objective:   Physical Exam Overweight female in no acute distress Nose without purulence or discharge noted No skin breakdown or pressure necrosis from the CPAP mask Neck without lymphadenopathy or thyromegaly Lower extremities with minimal edema, no cyanosis Alert and oriented, moves all 4 extremities.       Assessment & Plan:

## 2014-07-18 NOTE — Assessment & Plan Note (Signed)
The patient appears to be doing well on CPAP at this time, and has even lost weight since last visit. I have asked her to continue on her device, keep up with her mask changes and supplies, and to keep working on weight loss.

## 2014-07-21 ENCOUNTER — Encounter: Payer: Self-pay | Admitting: Internal Medicine

## 2014-07-21 ENCOUNTER — Ambulatory Visit (INDEPENDENT_AMBULATORY_CARE_PROVIDER_SITE_OTHER): Payer: Medicare Other | Admitting: Internal Medicine

## 2014-07-21 VITALS — BP 142/80 | HR 56 | Ht 66.0 in | Wt 210.9 lb

## 2014-07-21 DIAGNOSIS — I480 Paroxysmal atrial fibrillation: Secondary | ICD-10-CM

## 2014-07-21 DIAGNOSIS — I350 Nonrheumatic aortic (valve) stenosis: Secondary | ICD-10-CM

## 2014-07-21 DIAGNOSIS — G4733 Obstructive sleep apnea (adult) (pediatric): Secondary | ICD-10-CM

## 2014-07-21 DIAGNOSIS — I10 Essential (primary) hypertension: Secondary | ICD-10-CM

## 2014-07-21 DIAGNOSIS — E785 Hyperlipidemia, unspecified: Secondary | ICD-10-CM

## 2014-07-21 MED ORDER — DILTIAZEM HCL ER COATED BEADS 120 MG PO CP24: 120.0000 mg | ORAL_CAPSULE | Freq: Every day | ORAL | Status: DC

## 2014-07-21 MED ORDER — ATORVASTATIN CALCIUM 40 MG PO TABS: 40.0000 mg | ORAL_TABLET | Freq: Every day | ORAL | Status: DC

## 2014-07-21 NOTE — Progress Notes (Signed)
OFFICE NOTE  Chief Complaint:  Routine followup  Primary Care Physician: Chesley Noon, MD  HPI:  Jenna [name] is a 73 year old female with a history of mild aortic stenosis and a negative nuclear stress test in August 2013. Recently she had an episode of palpitations which she felt lasted up to about 10 minutes. During that time she felt a decrease in exercise tolerance with fatigue and no energy. This was concerning for AFib; however, she wore a monitor for a short period of time and it did not show that.  I set her up for a 30-day monitor, which she worse very faithfully between February 26 and December 31, 2012. The results indicated 1 very brief episode of AFib of less than 10 seconds in duration out of a total of over 40,000 seconds of monitoring. This correlates with a burden of less than 0.003%. At this point, given her low burden I feel that it is still reasonable to treat her with low-dose aspirin. However, we will continue to see her closely and adjust her anticoagulation as necessary. Since her last follow-up she denies any significant palpitations. She has reported some LE swelling, which she thinks is better now that she is off of her ARB?  Jenna [name] returns today for followup. She reports doing fairly well. In August she said she had an episode of age of fibrillation when coming home from the beach. That day she walked up and down the stairs several times and felt her heart racing. It never seemed to slow down. Finally she felt better the next day however she was fatigued and eventually recovered. Since that time she's had no further events. She does get some mild swelling in her legs. She is reporting some fatigue since starting a beta blocker and feels that it may be a side effect.  PMHx:  Past Medical History  Diagnosis Date  . RLS (restless legs syndrome)   . OSA (obstructive sleep apnea)   . Palpitations   . Fatigue   . Mild aortic stenosis   . Hypertension   .  Hypothyroidism     Past Surgical History  Procedure Laterality Date  . Lexiscan myoview  05/13/2012    No ECG changes. EKG negative for ischemia. No significant ischemia demonstrated.  . Transthoracic echocardiogram  02/18/2012    EF >55%, mild aortic stenosis    FAMHx:  Family History  Problem Relation Age of Onset  . Hypertension Mother   . Diabetes Mother   . Heart failure Father   . Arrhythmia Sister   . Hyperlipidemia Sister   . Hypertension Sister   . Healthy Brother   . Stroke Paternal Grandmother   . Cancer Paternal Grandfather   . Healthy Daughter   . Hypertension Son   . Hyperlipidemia Son     SOCHx:   reports that she has never smoked. She has never used smokeless tobacco. She reports that she does not drink alcohol or use illicit drugs.  ALLERGIES:  Allergies  Allergen Reactions  . Crestor [Rosuvastatin]     Joints ached  . Lovastatin     ROS: A comprehensive review of systems was negative except for: Constitutional: positive for fatigue Cardiovascular: positive for irregular heart beat and lower extremity edema  HOME MEDS: Current Outpatient Prescriptions  Medication Sig Dispense Refill  . acarbose (PRECOSE) 25 MG tablet Take 25 mg by mouth 3 (three) times daily.        [name] ALPRAZolam (XANAX) 0.25 MG tablet  Take 1 tablet by mouth Twice daily.      [name] aspirin 81 MG tablet Take 81 mg by mouth daily.        . Calcium Carbonate-Vitamin D (CALCIUM + D PO) Take 600 mg by mouth 2 (two) times daily. Once a day      . CHERRY CONCENTRATE PO Take 4 oz by mouth 2 (two) times daily.      . EUFLEXXA 20 MG/2ML SOLN As directed      . Ginger, Zingiber officinalis, (GINGER PO) Take 2 capsules by mouth daily.       [name] KLOR-CON M20 20 MEQ tablet Take 1 tablet by mouth as needed.       [name] levothyroxine (SYNTHROID, LEVOTHROID) 88 MCG tablet Take 88 mcg by mouth daily before breakfast.      . MAGNESIUM PO Take 1 tablet by mouth 2 (two) times daily.       . metoprolol tartrate  (LOPRESSOR) 25 MG tablet 12.5mg  (1/2 tablet) by mouth twice daily (for 3 days then STOP)      . Multiple Vitamins-Calcium (ONE-A-DAY WOMENS PO) Once a day       . Multiple Vitamins-Minerals (ICAPS AREDS FORMULA PO) Take 1 tablet by mouth 2 (two) times daily.      . naproxen sodium (ANAPROX) 220 MG tablet Take 220 mg by mouth 2 (two) times daily with a meal.      . Omega-3 Fatty Acids (FISH OIL) 1200 MG CAPS Take 1 at breakfast and 1 at supper      . omeprazole (PRILOSEC) 20 MG capsule Take 20 mg by mouth 2 (two) times daily.        [name] rOPINIRole (REQUIP) 0.5 MG tablet TAKE 1 TABLET BY MOUTH AS NEEDED  120 tablet  0  . torsemide (DEMADEX) 5 MG tablet Take 5-10 mg by mouth as needed.       . TURMERIC PO Take 2 tablets by mouth daily.       [name] atorvastatin (LIPITOR) 40 MG tablet Take 1 tablet (40 mg total) by mouth daily.  30 tablet  6  . diltiazem (CARDIZEM CD) 120 MG 24 hr capsule Take 1 capsule (120 mg total) by mouth daily.  30 capsule  6   No current facility-administered medications for this visit.    LABS/IMAGING: No results found for this or any previous visit (from the past 48 hour(s)). No results found.  VITALS: BP 142/80  Pulse 56  Ht 5\' 6"  (1.676 m)  Wt 210 lb 14.4 oz (95.664 kg)  BMI 34.06 kg/m2  EXAM: General appearance: alert and no distress Neck: no adenopathy, no carotid bruit, no JVD, supple, symmetrical, trachea midline and thyroid not enlarged, symmetric, no tenderness/mass/nodules Lungs: clear to auscultation bilaterally Heart: regular rate and rhythm, S1, S2 normal, 2-3/6 mid-peaking aortic murmur at RUSB Abdomen: soft, non-tender; bowel sounds normal; no masses,  no organomegaly Extremities: extremities normal, atraumatic, no cyanosis or edema Pulses: 2+ and symmetric Skin: Skin color, texture, turgor normal. No rashes or lesions Neurologic: Grossly normal Psych: Normal  EKG: Sinus bradycardia at 56  ASSESSMENT: 1. Paroxysmal atrial fibrillation - low  burden, on aspirin 2. HTN - controlled 3. Dyslipidemia - not on a statin, intolerant to Crestor and lovastatin 4. Mild aortic stenosis - unchanged by exam  PLAN: 1.  Mrs. Bento reported one episode of almost day long palpitations in August. Other than that she's had no further symptoms. She believes that the metoprolol has been helpful, however  she feels more fatigued and thinks she's had weight gain associated with it. She is requesting to come off of her beta blocker. I recommended that she decrease her dose in half to 12.5 mg twice daily. She should continue this for 3 days and then discontinue the medicine. She should notice she's feeling better and start low-dose diltiazem 120 mg CD the following week. This should help some with blood pressure and rate control. Her PCP suggested Bystolic, and while this is a very potent blood pressure medicine, is poor at its ability to reduce palpitations and atrial fibrillation.  Finally, she has been intolerant to Crestor and lovastatin in the past. Her last LDL cholesterol was 126. I recommended that she try atorvastatin 40 mg daily. We will repeat a lipid profile in 3 months.  Plan to see her back annually or sooner as necessary.  [name], MD, [facility] Attending Cardiologist [facility]  Laquandra Carrillo C 07/21/2014, 2:30 PM

## 2014-07-21 NOTE — Patient Instructions (Signed)
Your physician has recommended you make the following change in your medication... 1. START atorvastatin 40mg  once daily for cholesterol  2. DECREASE metoprolol to 12.5mg  twice daily for THREE days then STOP  >> if you feel better, please let us know.. THEN... 3. START cardizem CD 120mg  once daily   Please have fasting labwork in 3 months  Your physician wants you to follow-up in: 1 year with Dr. Debara Pickett. You will receive a reminder letter in the mail two months in advance. If you don't receive a letter, please call our office to schedule the follow-up appointment.

## 2014-07-22 ENCOUNTER — Ambulatory Visit: Payer: Medicare Other | Admitting: Pulmonary Disease

## 2014-08-08 ENCOUNTER — Other Ambulatory Visit: Payer: Self-pay | Admitting: Pulmonary Disease

## 2014-08-08 ENCOUNTER — Telehealth: Payer: Self-pay | Admitting: Internal Medicine

## 2014-08-08 NOTE — Telephone Encounter (Signed)
Please call, Dr Debara Pickett had put her on 2 new medicine. She needs to let the nurse know how they are doing.

## 2014-08-08 NOTE — Telephone Encounter (Signed)
Patient is doing fine with change from metoprolol to diltiazem. She reports she is tolerating atorvastatin and has no muscle aches, etc thus far. Recent lipid from PCP not received, but patient states her numbers were up and they wanted to start her on zetia but she informed PCP she was already on atorvastatin. She will have another lipid at the end of this month and will have PCP send to our office.

## 2014-09-05 ENCOUNTER — Telehealth: Payer: Self-pay | Admitting: Internal Medicine

## 2014-09-05 NOTE — Telephone Encounter (Signed)
Pt wants to know if you received a fax from Dr Melford Aase? They were suppose to have faxed you her lab results from October and November.

## 2014-09-06 ENCOUNTER — Encounter: Payer: Self-pay | Admitting: Internal Medicine

## 2014-09-12 NOTE — Telephone Encounter (Signed)
Faxed labs from PCP are scanned into EP[facility].   Will route to Dr. Debara Pickett to review labs as necessary.

## 2014-09-20 ENCOUNTER — Telehealth: Payer: Self-pay | Admitting: *Deleted

## 2014-09-20 MED ORDER — ROPINIROLE HCL 0.5 MG PO TABS: ORAL_TABLET | ORAL | Status: DC

## 2014-09-20 NOTE — Telephone Encounter (Signed)
That is ok 

## 2014-09-20 NOTE — Telephone Encounter (Signed)
Received refill request for pt requip. According to request pt repots she takes 2-3 tablets at night. Wants new RX to reflect this. Please advise [place] if okay to do so? thanks

## 2014-09-20 NOTE — Telephone Encounter (Signed)
Done

## 2014-10-18 ENCOUNTER — Other Ambulatory Visit: Payer: Self-pay

## 2014-10-19 ENCOUNTER — Other Ambulatory Visit: Payer: Self-pay

## 2014-12-16 ENCOUNTER — Observation Stay (HOSPITAL_COMMUNITY)
Admission: EM | Admit: 2014-12-16 | Discharge: 2014-12-20 | Disposition: A | Discharge status: Skilled Nursing Facility | Payer: Medicare Other | Attending: Internal Medicine | Admitting: Internal Medicine

## 2014-12-16 ENCOUNTER — Emergency Department (HOSPITAL_COMMUNITY): Payer: Medicare Other

## 2014-12-16 ENCOUNTER — Encounter (HOSPITAL_COMMUNITY): Payer: Self-pay | Admitting: Emergency Medicine

## 2014-12-16 DIAGNOSIS — I350 Nonrheumatic aortic (valve) stenosis: Secondary | ICD-10-CM | POA: Insufficient documentation

## 2014-12-16 DIAGNOSIS — Z9049 Acquired absence of other specified parts of digestive tract: Secondary | ICD-10-CM | POA: Insufficient documentation

## 2014-12-16 DIAGNOSIS — E039 Hypothyroidism, unspecified: Secondary | ICD-10-CM | POA: Insufficient documentation

## 2014-12-16 DIAGNOSIS — Z888 Allergy status to other drugs, medicaments and biological substances status: Secondary | ICD-10-CM | POA: Diagnosis not present

## 2014-12-16 DIAGNOSIS — I10 Essential (primary) hypertension: Secondary | ICD-10-CM | POA: Diagnosis present

## 2014-12-16 DIAGNOSIS — Z85820 Personal history of malignant melanoma of skin: Secondary | ICD-10-CM | POA: Diagnosis not present

## 2014-12-16 DIAGNOSIS — Z9849 Cataract extraction status, unspecified eye: Secondary | ICD-10-CM | POA: Diagnosis not present

## 2014-12-16 DIAGNOSIS — H81399 Other peripheral vertigo, unspecified ear: Secondary | ICD-10-CM | POA: Diagnosis not present

## 2014-12-16 DIAGNOSIS — Z7982 Long term (current) use of aspirin: Secondary | ICD-10-CM | POA: Insufficient documentation

## 2014-12-16 DIAGNOSIS — G4733 Obstructive sleep apnea (adult) (pediatric): Secondary | ICD-10-CM | POA: Insufficient documentation

## 2014-12-16 DIAGNOSIS — R42 Dizziness and giddiness: Secondary | ICD-10-CM

## 2014-12-16 DIAGNOSIS — G2581 Restless legs syndrome: Secondary | ICD-10-CM | POA: Diagnosis not present

## 2014-12-16 DIAGNOSIS — Z823 Family history of stroke: Secondary | ICD-10-CM | POA: Insufficient documentation

## 2014-12-16 DIAGNOSIS — I480 Paroxysmal atrial fibrillation: Secondary | ICD-10-CM | POA: Diagnosis not present

## 2014-12-16 LAB — I-STAT TROPONIN, ED: Troponin i, poc: 0 ng/mL (ref 0.00–0.08)

## 2014-12-16 LAB — URINALYSIS, ROUTINE W REFLEX MICROSCOPIC
Bilirubin Urine: NEGATIVE
Glucose, UA: NEGATIVE mg/dL
Hgb urine dipstick: NEGATIVE
Ketones, ur: NEGATIVE mg/dL
Nitrite: NEGATIVE
Protein, ur: NEGATIVE mg/dL
Specific Gravity, Urine: 1.012 (ref 1.005–1.030)
Urobilinogen, UA: 0.2 mg/dL (ref 0.0–1.0)
pH: 8 (ref 5.0–8.0)

## 2014-12-16 LAB — I-STAT CHEM 8, ED
BUN: 18 mg/dL (ref 6–23)
Calcium, Ion: 1.06 mmol/L — ABNORMAL LOW (ref 1.13–1.30)
Chloride: 100 mmol/L (ref 96–112)
Creatinine, Ser: 0.7 mg/dL (ref 0.50–1.10)
Glucose, Bld: 118 mg/dL — ABNORMAL HIGH (ref 70–99)
HCT: 44 % (ref 36.0–46.0)
Hemoglobin: 15 g/dL (ref 12.0–15.0)
Potassium: 4.8 mmol/L (ref 3.5–5.1)
Sodium: 135 mmol/L (ref 135–145)
TCO2: 25 mmol/L (ref 0–100)

## 2014-12-16 LAB — COMPREHENSIVE METABOLIC PANEL
ALT: 24 U/L (ref 0–35)
AST: 32 U/L (ref 0–37)
Albumin: 3.6 g/dL (ref 3.5–5.2)
Alkaline Phosphatase: 61 U/L (ref 39–117)
Anion gap: 10 (ref 5–15)
BUN: 12 mg/dL (ref 6–23)
CO2: 24 mmol/L (ref 19–32)
Calcium: 8.8 mg/dL (ref 8.4–10.5)
Chloride: 101 mmol/L (ref 96–112)
Creatinine, Ser: 0.71 mg/dL (ref 0.50–1.10)
GFR calc Af Amer: >90 mL/min (ref >90)
GFR calc non Af Amer: 84 mL/min — ABNORMAL LOW (ref >90)
Glucose, Bld: 114 mg/dL — ABNORMAL HIGH (ref 70–99)
Potassium: 5 mmol/L (ref 3.5–5.1)
Sodium: 135 mmol/L (ref 135–145)
Total Bilirubin: 1.3 mg/dL — ABNORMAL HIGH (ref 0.3–1.2)
Total Protein: 6.3 g/dL (ref 6.0–8.3)

## 2014-12-16 LAB — RAPID URINE DRUG SCREEN, HOSP PERFORMED
Amphetamines: NOT DETECTED
Barbiturates: NOT DETECTED
Benzodiazepines: POSITIVE — AB
Cocaine: NOT DETECTED
Opiates: NOT DETECTED
Tetrahydrocannabinol: NOT DETECTED

## 2014-12-16 LAB — URINE MICROSCOPIC-ADD ON

## 2014-12-16 LAB — PROTIME-INR
INR: 0.97 (ref 0.00–1.49)
INR: 0.88 (ref 0.00–1.49)
Prothrombin Time: 13 seconds (ref 11.6–15.2)
Prothrombin Time: 12.1 seconds (ref 11.6–15.2)

## 2014-12-16 LAB — ETHANOL: Alcohol, Ethyl (B): <5 mg/dL (ref 0–9)

## 2014-12-16 LAB — APTT: aPTT: 28 seconds (ref 24–37)

## 2014-12-16 MED ORDER — ATORVASTATIN CALCIUM 40 MG PO TABS
40.0000 mg | ORAL_TABLET | Freq: Every day | ORAL | Status: DC
  Administered 2014-12-17: 40 mg via ORAL
  Filled 2014-12-16 (×2): qty 1

## 2014-12-16 MED ORDER — MECLIZINE HCL 12.5 MG PO TABS: 25.0000 mg | ORAL_TABLET | Freq: Three times a day (TID) | ORAL | Status: DC | PRN

## 2014-12-16 MED ORDER — HEPARIN SODIUM (PORCINE) 5000 UNIT/ML IJ SOLN
5000.0000 [IU] | Freq: Three times a day (TID) | INTRAMUSCULAR | Status: DC
  Administered 2014-12-16 – 2014-12-20 (×11): 5000 [IU] via SUBCUTANEOUS
  Filled 2014-12-16 (×10): qty 1

## 2014-12-16 MED ORDER — DILTIAZEM HCL ER COATED BEADS 120 MG PO CP24
120.0000 mg | ORAL_CAPSULE | Freq: Every day | ORAL | Status: DC
  Administered 2014-12-17 – 2014-12-20 (×4): 120 mg via ORAL
  Filled 2014-12-16 (×5): qty 1

## 2014-12-16 MED ORDER — ROPINIROLE HCL 0.5 MG PO TABS
0.5000 mg | ORAL_TABLET | Freq: Every day | ORAL | Status: DC | PRN
  Administered 2014-12-17: 0.5 mg via ORAL
  Filled 2014-12-16 (×3): qty 1

## 2014-12-16 MED ORDER — MECLIZINE HCL 25 MG PO TABS
25.0000 mg | ORAL_TABLET | Freq: Once | ORAL | Status: AC
  Administered 2014-12-16: 25 mg via ORAL
  Filled 2014-12-16: qty 1

## 2014-12-16 MED ORDER — NAPROXEN 250 MG PO TABS
500.0000 mg | ORAL_TABLET | Freq: Two times a day (BID) | ORAL | Status: DC
  Administered 2014-12-17 – 2014-12-20 (×8): 500 mg via ORAL
  Filled 2014-12-16 (×8): qty 2

## 2014-12-16 MED ORDER — ALPRAZOLAM 0.25 MG PO TABS
0.2500 mg | ORAL_TABLET | Freq: Two times a day (BID) | ORAL | Status: DC | PRN
  Administered 2014-12-19: 0.25 mg via ORAL
  Filled 2014-12-16: qty 1

## 2014-12-16 MED ORDER — SERTRALINE HCL 50 MG PO TABS
50.0000 mg | ORAL_TABLET | Freq: Every day | ORAL | Status: DC
  Administered 2014-12-17 – 2014-12-20 (×4): 50 mg via ORAL
  Filled 2014-12-16 (×4): qty 1

## 2014-12-16 MED ORDER — PANTOPRAZOLE SODIUM 40 MG PO TBEC
80.0000 mg | DELAYED_RELEASE_TABLET | Freq: Every day | ORAL | Status: DC
  Administered 2014-12-17: 80 mg via ORAL
  Filled 2014-12-16: qty 2

## 2014-12-16 MED ORDER — ASPIRIN 81 MG PO CHEW
81.0000 mg | CHEWABLE_TABLET | Freq: Every day | ORAL | Status: DC
  Administered 2014-12-17 – 2014-12-20 (×4): 81 mg via ORAL
  Filled 2014-12-16 (×4): qty 1

## 2014-12-16 MED ORDER — ACARBOSE 25 MG PO TABS
25.0000 mg | ORAL_TABLET | Freq: Three times a day (TID) | ORAL | Status: DC
  Administered 2014-12-17 – 2014-12-20 (×11): 25 mg via ORAL
  Filled 2014-12-16 (×15): qty 1

## 2014-12-16 MED ORDER — MAGNESIUM OXIDE 400 (241.3 MG) MG PO TABS
400.0000 mg | ORAL_TABLET | Freq: Every day | ORAL | Status: DC
  Administered 2014-12-17 – 2014-12-20 (×4): 400 mg via ORAL
  Filled 2014-12-16 (×4): qty 1

## 2014-12-16 MED ORDER — LEVOTHYROXINE SODIUM 88 MCG PO TABS
88.0000 ug | ORAL_TABLET | Freq: Every day | ORAL | Status: DC
  Administered 2014-12-17 – 2014-12-20 (×4): 88 ug via ORAL
  Filled 2014-12-16 (×4): qty 1

## 2014-12-16 MED ORDER — LORAZEPAM 2 MG/ML IJ SOLN
0.5000 mg | Freq: Once | INTRAMUSCULAR | Status: AC
Start: 2014-12-16 — End: 2014-12-16
  Administered 2014-12-16: 0.5 mg via INTRAVENOUS
  Filled 2014-12-16: qty 1

## 2014-12-16 NOTE — H&P (Signed)
[facility] History and Physical  Jenna [name] FHL:[id number] DOB: 10/19/1940 DOA: 12/16/2014  Referring physician: EDP PCP: Chesley Noon, MD   Chief Complaint: Vertigo   HPI: Jenna [name] is a 74 y.o. female h/o intermittent A.Fib, not on anticoagulants due to short duration and infrequency.  Patient presents to ED today with vertigo.  Patient had fullness in her R ear yesterday (cant tell if hearing decreased from baseline as her hearing at baseline is poor and she wears a hearing aid in that ear).  Yesterday evening she noted room was spinning from left to right.  Worse with movement, better at rest.  Continued through this morning, due to unsteady gait she presents to ED.  Review of Systems: Systems reviewed.  As above, otherwise negative  Past Medical History  Diagnosis Date  . RLS (restless legs syndrome)   . OSA (obstructive sleep apnea)   . Palpitations   . Fatigue   . Mild aortic stenosis   . Hypertension   . Hypothyroidism   . Cancer     melanoma   Past Surgical History  Procedure Laterality Date  . Lexiscan myoview  05/13/2012    No ECG changes. EKG negative for ischemia. No significant ischemia demonstrated.  . Transthoracic echocardiogram  02/18/2012    EF >55%, mild aortic stenosis  . Abdominal hysterectomy    . Cataract extraction     Social History:  reports that she has never smoked. She has never used smokeless tobacco. She reports that she does not drink alcohol or use illicit drugs.  Allergies  Allergen Reactions  . Crestor [Rosuvastatin]     Joints ached  . Lovastatin     Joints hurt all over body     Family History  Problem Relation Age of Onset  . Hypertension Mother   . Diabetes Mother   . Heart failure Father   . Arrhythmia Sister   . Hyperlipidemia Sister   . Hypertension Sister   . Healthy Brother   . Stroke Paternal Grandmother   . Cancer Paternal Grandfather   . Healthy Daughter   . Hypertension Son   . Hyperlipidemia Son       Prior to Admission medications   Medication Sig Start Date End Date Taking? Authorizing Provider  acarbose (PRECOSE) 25 MG tablet Take 25 mg by mouth 3 (three) times daily.     Yes Historical Provider, MD  ALPRAZolam Duanne Moron) 0.25 MG tablet Take 1 tablet by mouth Twice daily. 07/17/12  Yes Historical Provider, MD  aspirin 81 MG tablet Take 81 mg by mouth daily.     Yes Historical Provider, MD  atorvastatin (LIPITOR) 40 MG tablet Take 1 tablet (40 mg total) by mouth daily. 07/21/14  Yes [name], MD  Calcium Carbonate-Vitamin D (CALCIUM + D PO) Take 600 mg by mouth 2 (two) times daily. Once a day   Yes Historical Provider, MD  CHERRY CONCENTRATE PO Take 4 oz by mouth 2 (two) times daily.   Yes Historical Provider, MD  diltiazem (CARDIZEM CD) 120 MG 24 hr capsule Take 1 capsule (120 mg total) by mouth daily. 07/21/14  Yes [name], MD  Ginger, Zingiber officinalis, (GINGER PO) Take 2 capsules by mouth daily.    Yes Historical Provider, MD  KLOR-CON M20 20 MEQ tablet Take 1 tablet by mouth daily as needed. For low potassium 07/11/12  Yes Historical Provider, MD  levothyroxine (SYNTHROID, LEVOTHROID) 88 MCG tablet Take 88 mcg by mouth daily before breakfast.  Yes Historical Provider, MD  Magnesium 250 MG TABS Take 1 tablet by mouth daily.   Yes Historical Provider, MD  Multiple Vitamins-Calcium (ONE-A-DAY WOMENS PO) Once a day    Yes Historical Provider, MD  Multiple Vitamins-Minerals (ICAPS AREDS FORMULA PO) Take 1 tablet by mouth 2 (two) times daily.   Yes Historical Provider, MD  Multiple Vitamins-Minerals (ZINC PO) Take 1 tablet by mouth every evening.   Yes Historical Provider, MD  naproxen (NAPROSYN) 500 MG tablet Take 500 mg by mouth 2 (two) times daily with a meal.   Yes Historical Provider, MD  Omega-3 Fatty Acids (FISH OIL) 1200 MG CAPS Take 1 at breakfast and 1 at supper   Yes Historical Provider, MD  omeprazole (PRILOSEC) 40 MG capsule Take 40 mg by mouth every evening.    Yes Historical Provider, MD  rOPINIRole (REQUIP) 0.5 MG tablet Take 2-3 daily at bedtime as needed Patient taking differently: Take 0.5 mg by mouth daily as needed. Take 2-3 daily at bedtime as needed 09/20/14  Yes Kathee Delton, MD  sertraline (ZOLOFT) 50 MG tablet Take 50 mg by mouth daily.   Yes Historical Provider, MD  torsemide (DEMADEX) 5 MG tablet Take 5-10 mg by mouth as needed.    Yes Historical Provider, MD  TURMERIC PO Take 2 tablets by mouth daily.    Yes Historical Provider, MD   Physical Exam: Filed Vitals:   12/16/14 2200  BP: 156/72  Pulse: 57  Temp:   Resp: 17    BP 156/72 mmHg  Pulse 57  Temp(Src) 97.5 F (36.4 C) (Oral)  Resp 17  Ht 5\' 6"  (1.676 m)  Wt 91.627 kg (202 lb)  BMI 32.62 kg/m2  SpO2 98%  General Appearance:    Alert, oriented, no distress, appears stated age  Head:    Normocephalic, atraumatic  Eyes:    PERRL, EOMI, sclera non-icteric        Nose:   Nares without drainage or epistaxis. Mucosa, turbinates normal  Throat:   Moist mucous membranes. Oropharynx without erythema or exudate.  Neck:   Supple. No carotid bruits.  No thyromegaly.  No lymphadenopathy.   Back:     No CVA tenderness, no spinal tenderness  Lungs:     Clear to auscultation bilaterally, without wheezes, rhonchi or rales  Chest wall:    No tenderness to palpitation  Heart:    Regular rate and rhythm without murmurs, gallops, rubs  Abdomen:     Soft, non-tender, nondistended, normal bowel sounds, no organomegaly  Genitalia:    deferred  Rectal:    deferred  Extremities:   No clubbing, cyanosis or edema.  Pulses:   2+ and symmetric all extremities  Skin:   Skin color, texture, turgor normal, no rashes or lesions  Lymph nodes:   Cervical, supraclavicular, and axillary nodes normal  Neurologic:   CNII-XII intact. Normal strength, sensation and reflexes      throughout    Labs on Admission:  Basic Metabolic Panel:  Recent Labs Lab 12/16/14 1253 12/16/14 1305  NA 135  135  K 5.0 4.8  CL 101 100  CO2 24  --   GLUCOSE 114* 118*  BUN 12 18  CREATININE 0.71 0.70  CALCIUM 8.8  --    Liver Function Tests:  Recent Labs Lab 12/16/14 1253  AST 32  ALT 24  ALKPHOS 61  BILITOT 1.3*  PROT 6.3  ALBUMIN 3.6   No results for input(s): LIPASE, AMYLASE in the last  168 hours. No results for input(s): AMMONIA in the last 168 hours. CBC:  Recent Labs Lab 12/16/14 1305  HGB 15.0  HCT 44.0   Cardiac Enzymes: No results for input(s): CKTOTAL, CKMB, CKMBINDEX, TROPONINI in the last 168 hours.  BNP (last 3 results) No results for input(s): PROBNP in the last 8760 hours. CBG: No results for input(s): GLUCAP in the last 168 hours.  Radiological Exams on Admission: Ct Head Wo Contrast  12/16/2014   CLINICAL DATA:  Intermittent dizziness  EXAM: CT HEAD WITHOUT CONTRAST  TECHNIQUE: Contiguous axial images were obtained from the base of the skull through the vertex without intravenous contrast.  COMPARISON:  01/30/2007  FINDINGS: The bony calvarium is intact. The ventricles are of normal size and configuration. No findings to suggest acute hemorrhage, acute infarction or space-occupying mass lesion are noted.  IMPRESSION: No acute intracranial abnormality noted.   Electronically Signed   By: Inez Catalina M.D.   On: 12/16/2014 15:29   Mr Brain Wo Contrast  12/16/2014   CLINICAL DATA:  Vertigo. Intermittent dizziness near syncope for the past 3 days. Tachycardia. Fullness in the right ear with possible decreased hearing.  EXAM: MRI HEAD WITHOUT CONTRAST  TECHNIQUE: Multiplanar, multiecho pulse sequences of the brain and surrounding structures were obtained without intravenous contrast.  COMPARISON:  CT head without contrast from the same day. MRI brain 01/30/2007.  FINDINGS: Mild atrophy and white matter changes are within normal limits for age. No acute infarct, hemorrhage, or mass lesion is present. The ventricles are proportionate to the degree of atrophy. No  significant extraaxial fluid collection is present.  Flow is present in the major intracranial arteries. The patient is status post bilateral lens replacements. The globes and orbits are otherwise intact. The paranasal sinuses and mastoid air cells are clear.  Skullbase is unremarkable. Midline structures are within normal limits.  IMPRESSION: Normal MRI of the brain for age.   Electronically Signed   By: San Morelle M.D.   On: 12/16/2014 19:32    EKG: Independently reviewed.  Assessment/Plan Principal Problem:   Peripheral vertigo Active Problems:   Vertigo   1. Peripheral vertigo - work up in ED including MRI brain negative for acute stroke or acute findings 1. Suspicious for R ear involvement given the HPI 2. antivert 3. PT/OT eval and treat    Code Status: Full Code  Family Communication: Husband at bedside Disposition Plan: Admit to obs   Time spent: 30 min  Keeghan Mcintire M. [facility] Pager [phone]  If 7AM-7PM, please contact the day team taking care of the patient [email or web address] Password [email or web address] 12/16/2014, 10:30 PM

## 2014-12-16 NOTE — Progress Notes (Signed)
Patient arrived to 4N22 AAOx4, pleasant, and in no immediate distress. Pt able to slide over to the bed. Vitals taken and tele placed. Patient oriented to the room and questions answered. Will continue to monitor. Shevy Yaney, Rande Brunt, RN

## 2014-12-16 NOTE — ED Provider Notes (Signed)
Patient complaint of dizziness i.e. Feeling as if the room spinning accompanied by nausea onset upon awakening this morning. Symptoms worse with changing positions improved with lying still. On exam patient is alert Glasgow Coma Score 15 moves all extremities well. She becomes extremely vertiginous upon trying to stand from a supine position. She is unable to stand due. Meclizine, Ativan ordered. 9:20 PM patient is still very unsteady on her feet after treatment with meclizine, and intravenous Ativan. Patient is at risk for falls. She lives alone. Hospitalist consulted for inpatient stay Spoke with Dr. Alcario Drought plan 23 hour observation MedSurg floor Results for orders placed or performed during the hospital encounter of 12/16/14  Ethanol  Result Value Ref Range   Alcohol, Ethyl (B) <5 0 - 9 mg/dL  Protime-INR  Result Value Ref Range   Prothrombin Time 12.1 11.6 - 15.2 seconds   INR 0.88 0.00 - 1.49  APTT  Result Value Ref Range   aPTT SPECIMEN CLOTTED 24 - 37 seconds  Comprehensive metabolic panel  Result Value Ref Range   Sodium 135 135 - 145 mmol/L   Potassium 5.0 3.5 - 5.1 mmol/L   Chloride 101 96 - 112 mmol/L   CO2 24 19 - 32 mmol/L   Glucose, Bld 114 (H) 70 - 99 mg/dL   BUN 12 6 - 23 mg/dL   Creatinine, Ser 0.71 0.50 - 1.10 mg/dL   Calcium 8.8 8.4 - 10.5 mg/dL   Total Protein 6.3 6.0 - 8.3 g/dL   Albumin 3.6 3.5 - 5.2 g/dL   AST 32 0 - 37 U/L   ALT 24 0 - 35 U/L   Alkaline Phosphatase 61 39 - 117 U/L   Total Bilirubin 1.3 (H) 0.3 - 1.2 mg/dL   GFR calc non Af Amer 84 (L) >90 mL/min   GFR calc Af Amer >90 >90 mL/min   Anion gap 10 5 - 15  Urine Drug Screen  Result Value Ref Range   Opiates NONE DETECTED NONE DETECTED   Cocaine NONE DETECTED NONE DETECTED   Benzodiazepines POSITIVE (A) NONE DETECTED   Amphetamines NONE DETECTED NONE DETECTED   Tetrahydrocannabinol NONE DETECTED NONE DETECTED   Barbiturates NONE DETECTED NONE DETECTED  Urinalysis, Routine w reflex  microscopic  Result Value Ref Range   Color, Urine YELLOW YELLOW   APPearance HAZY (A) CLEAR   Specific Gravity, Urine 1.012 1.005 - 1.030   pH 8.0 5.0 - 8.0   Glucose, UA NEGATIVE NEGATIVE mg/dL   Hgb urine dipstick NEGATIVE NEGATIVE   Bilirubin Urine NEGATIVE NEGATIVE   Ketones, ur NEGATIVE NEGATIVE mg/dL   Protein, ur NEGATIVE NEGATIVE mg/dL   Urobilinogen, UA 0.2 0.0 - 1.0 mg/dL   Nitrite NEGATIVE NEGATIVE   Leukocytes, UA TRACE (A) NEGATIVE  Protime-INR  Result Value Ref Range   Prothrombin Time 13.0 11.6 - 15.2 seconds   INR 0.97 0.00 - 1.49  APTT  Result Value Ref Range   aPTT 28 24 - 37 seconds  Urine microscopic-add on  Result Value Ref Range   Squamous Epithelial / LPF RARE RARE   WBC, UA 0-2 <3 WBC/hpf   Bacteria, UA RARE RARE   Urine-Other AMORPHOUS URATES/PHOSPHATES   I-Stat Chem 8, ED  Result Value Ref Range   Sodium 135 135 - 145 mmol/L   Potassium 4.8 3.5 - 5.1 mmol/L   Chloride 100 96 - 112 mmol/L   BUN 18 6 - 23 mg/dL   Creatinine, Ser 0.70 0.50 - 1.10 mg/dL  Glucose, Bld 118 (H) 70 - 99 mg/dL   Calcium, Ion 1.06 (L) 1.13 - 1.30 mmol/L   TCO2 25 0 - 100 mmol/L   Hemoglobin 15.0 12.0 - 15.0 g/dL   HCT 44.0 36.0 - 46.0 %  I-Stat Troponin, ED (not at [facility])  Result Value Ref Range   Troponin i, poc 0.00 0.00 - 0.08 ng/mL   Comment 3           Ct Head Wo Contrast  12/16/2014   CLINICAL DATA:  Intermittent dizziness  EXAM: CT HEAD WITHOUT CONTRAST  TECHNIQUE: Contiguous axial images were obtained from the base of the skull through the vertex without intravenous contrast.  COMPARISON:  01/30/2007  FINDINGS: The bony calvarium is intact. The ventricles are of normal size and configuration. No findings to suggest acute hemorrhage, acute infarction or space-occupying mass lesion are noted.  IMPRESSION: No acute intracranial abnormality noted.   Electronically Signed   By: Inez Catalina M.D.   On: 12/16/2014 15:29   Mr Brain Wo Contrast  12/16/2014   CLINICAL  DATA:  Vertigo. Intermittent dizziness near syncope for the past 3 days. Tachycardia. Fullness in the right ear with possible decreased hearing.  EXAM: MRI HEAD WITHOUT CONTRAST  TECHNIQUE: Multiplanar, multiecho pulse sequences of the brain and surrounding structures were obtained without intravenous contrast.  COMPARISON:  CT head without contrast from the same day. MRI brain 01/30/2007.  FINDINGS: Mild atrophy and white matter changes are within normal limits for age. No acute infarct, hemorrhage, or mass lesion is present. The ventricles are proportionate to the degree of atrophy. No significant extraaxial fluid collection is present.  Flow is present in the major intracranial arteries. The patient is status post bilateral lens replacements. The globes and orbits are otherwise intact. The paranasal sinuses and mastoid air cells are clear.  Skullbase is unremarkable. Midline structures are within normal limits.  IMPRESSION: Normal MRI of the brain for age.   Electronically Signed   By: San Morelle M.D.   On: 12/16/2014 19:32   Dx Vertigo  Orlie Dakin, MD 12/17/14 [phone]

## 2014-12-16 NOTE — ED Notes (Signed)
Per ems and pt, pt reports intermittent dizziness and near-syncope since Tuesday.  Pt arrives awake, alert, oriented,  No complaints at this time

## 2014-12-16 NOTE — ED Provider Notes (Signed)
CSN: [id number]     Arrival date & time 12/16/14  1102 History   First MD Initiated Contact with Patient 12/16/14 1104     Chief Complaint  Patient presents with  . Dizziness     (Consider location/radiation/quality/duration/timing/severity/associated sxs/prior Treatment) HPI Comments: Patient presents to the ER for evaluation of intermittent episodes of dizziness. Symptoms have been ongoing for 3 days. She reports that they are intermittent. Patient reports worsening of symptoms today. She has been having difficulty ambulating because of feeling off-balance. Symptoms worsen with changes in position. She also reports that if she stands up and closes her eyes, she falls forward. She has not had any headache. She reports feeling like there is fluid in her right ear, but no ear pain or hearing change. 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 No numbness, tingling or weakness of extremities.  Patient is a 74 y.o. female presenting with dizziness.  Dizziness   Past Medical History  Diagnosis Date  . RLS (restless legs syndrome)   . OSA (obstructive sleep apnea)   . Palpitations   . Fatigue   . Mild aortic stenosis   . Hypertension   . Hypothyroidism   . Cancer     melanoma   Past Surgical History  Procedure Laterality Date  . Lexiscan myoview  05/13/2012    No ECG changes. EKG negative for ischemia. No significant ischemia demonstrated.  . Transthoracic echocardiogram  02/18/2012    EF >55%, mild aortic stenosis  . Abdominal hysterectomy    . Cataract extraction     Family History  Problem Relation Age of Onset  . Hypertension Mother   . Diabetes Mother   . Heart failure Father   . Arrhythmia Sister   . Hyperlipidemia Sister   . Hypertension Sister   . Healthy Brother   . Stroke Paternal Grandmother   . Cancer Paternal Grandfather   . Healthy Daughter   . Hypertension Son   . Hyperlipidemia Son    History  Substance Use Topics  . Smoking status: Never Smoker    . Smokeless tobacco: Never Used  . Alcohol Use: No   OB History    No data available     Review of Systems  Neurological: Positive for dizziness.  All other systems reviewed and are negative.     Allergies  Crestor and Lovastatin  Home Medications   Prior to Admission medications   Medication Sig Start Date End Date Taking? Authorizing Provider  acarbose (PRECOSE) 25 MG tablet Take 25 mg by mouth 3 (three) times daily.      Historical Provider, MD  ALPRAZolam Duanne Moron) 0.25 MG tablet Take 1 tablet by mouth Twice daily. 07/17/12   Historical Provider, MD  aspirin 81 MG tablet Take 81 mg by mouth daily.      Historical Provider, MD  atorvastatin (LIPITOR) 40 MG tablet Take 1 tablet (40 mg total) by mouth daily. 07/21/14   [name], MD  Calcium Carbonate-Vitamin D (CALCIUM + D PO) Take 600 mg by mouth 2 (two) times daily. Once a day    Historical Provider, MD  CHERRY CONCENTRATE PO Take 4 oz by mouth 2 (two) times daily.    Historical Provider, MD  diltiazem (CARDIZEM CD) 120 MG 24 hr capsule Take 1 capsule (120 mg total) by mouth daily. 07/21/14   [name], MD  EUFLEXXA 20 MG/2ML SOLN As directed 06/28/13   Historical Provider, MD  Ginger, Zingiber officinalis, (GINGER PO) Take 2  capsules by mouth daily.     Historical Provider, MD  KLOR-CON M20 20 MEQ tablet Take 1 tablet by mouth as needed.  07/11/12   Historical Provider, MD  levothyroxine (SYNTHROID, LEVOTHROID) 88 MCG tablet Take 88 mcg by mouth daily before breakfast.    Historical Provider, MD  MAGNESIUM PO Take 1 tablet by mouth 2 (two) times daily.     Historical Provider, MD  metoprolol tartrate (LOPRESSOR) 25 MG tablet 12.5mg  (1/2 tablet) by mouth twice daily (for 3 days then STOP) 02/03/14   [name], MD  Multiple Vitamins-Calcium (ONE-A-DAY WOMENS PO) Once a day     Historical Provider, MD  Multiple Vitamins-Minerals (ICAPS AREDS FORMULA PO) Take 1 tablet by mouth 2 (two) times daily.    Historical  Provider, MD  naproxen sodium (ANAPROX) 220 MG tablet Take 220 mg by mouth 2 (two) times daily with a meal.    Historical Provider, MD  Omega-3 Fatty Acids (FISH OIL) 1200 MG CAPS Take 1 at breakfast and 1 at supper    Historical Provider, MD  omeprazole (PRILOSEC) 20 MG capsule Take 20 mg by mouth 2 (two) times daily.      Historical Provider, MD  rOPINIRole (REQUIP) 0.5 MG tablet Take 2-3 daily at bedtime as needed 09/20/14   Kathee Delton, MD  torsemide (DEMADEX) 5 MG tablet Take 5-10 mg by mouth as needed.     Historical Provider, MD  TURMERIC PO Take 2 tablets by mouth daily.     Historical Provider, MD   BP 174/60 mmHg  Pulse 60  Temp(Src) 97.5 F (36.4 C) (Oral)  Resp 16  Ht 5\' 6"  (1.676 m)  Wt 202 lb (91.627 kg)  BMI 32.62 kg/m2  SpO2 100% Physical Exam  Constitutional: She is oriented to person, place, and time. She appears well-developed and well-nourished. No distress.  HENT:  Head: Normocephalic and atraumatic.  Right Ear: Hearing normal.  Left Ear: Hearing normal.  Nose: Nose normal.  Mouth/Throat: Oropharynx is clear and moist and mucous membranes are normal.  Eyes: Conjunctivae and EOM are normal. Pupils are equal, round, and reactive to light.  Neck: Normal range of motion. Neck supple.  Cardiovascular: Regular rhythm, S1 normal and S2 normal.  Exam reveals no gallop and no friction rub.   No murmur heard. Pulmonary/Chest: Effort normal and breath sounds normal. No respiratory distress. She exhibits no tenderness.  Abdominal: Soft. Normal appearance and bowel sounds are normal. There is no hepatosplenomegaly. There is no tenderness. There is no rebound, no guarding, no tenderness at McBurney's point and negative Murphy's sign. No hernia.  Musculoskeletal: Normal range of motion.  Neurological: She is alert and oriented to person, place, and time. She has normal strength. No cranial nerve deficit or sensory deficit. Coordination normal. GCS eye subscore is 4. GCS  verbal subscore is 5. GCS motor subscore is 6.  Extraocular muscle movement: normal No visual field cut Pupils: equal and reactive both direct and consensual response is normal No nystagmus present    Sensory function is intact to light touch, pinprick Proprioception intact  Grip strength 5/5 symmetric in upper extremities No pronator drift Normal finger to nose bilaterally  Lower extremity strength 5/5 against gravity Normal heel to shin bilaterally     Skin: Skin is warm, dry and intact. No rash noted. No cyanosis.  Psychiatric: She has a normal mood and affect. Her speech is normal and behavior is normal. Thought content normal.  Nursing note and vitals reviewed.  ED Course  Procedures (including critical care time) Labs Review Labs Reviewed  COMPREHENSIVE METABOLIC PANEL - Abnormal; Notable for the following:    Glucose, Bld 114 (*)    Total Bilirubin 1.3 (*)    GFR calc non Af Amer 84 (*)    All other components within normal limits  URINE RAPID DRUG SCREEN (HOSP PERFORMED) - Abnormal; Notable for the following:    Benzodiazepines POSITIVE (*)    All other components within normal limits  URINALYSIS, ROUTINE W REFLEX MICROSCOPIC - Abnormal; Notable for the following:    APPearance HAZY (*)    Leukocytes, UA TRACE (*)    All other components within normal limits  I-STAT CHEM 8, ED - Abnormal; Notable for the following:    Glucose, Bld 118 (*)    Calcium, Ion 1.06 (*)    All other components within normal limits  ETHANOL  PROTIME-INR  PROTIME-INR  APTT  URINE MICROSCOPIC-ADD ON  APTT  I-STAT TROPOININ, ED  I-STAT TROPOININ, ED    Imaging Review Ct Head Wo Contrast  12/16/2014   CLINICAL DATA:  Intermittent dizziness  EXAM: CT HEAD WITHOUT CONTRAST  TECHNIQUE: Contiguous axial images were obtained from the base of the skull through the vertex without intravenous contrast.  COMPARISON:  01/30/2007  FINDINGS: The bony calvarium is intact. The ventricles are of  normal size and configuration. No findings to suggest acute hemorrhage, acute infarction or space-occupying mass lesion are noted.  IMPRESSION: No acute intracranial abnormality noted.   Electronically Signed   By: Inez Catalina M.D.   On: 12/16/2014 15:29     EKG Interpretation   Date/Time:  Friday December 16 2014 11:09:12 EST Ventricular Rate:  67 PR Interval:  154 QRS Duration: 92 QT Interval:  549 QTC Calculation: 580 R Axis:   -11 Text Interpretation:  Sinus rhythm Borderline abnrm T, anterolateral leads  Prolonged QT interval No previous tracing Confirmed by Quinne Pires  MD,  [place] [phone]) on 12/16/2014 11:13:17 AM      MDM   Final diagnoses:  None   vertigo  Patient presents to the ER for evaluation of dizziness. Patient has had several days of intermittent episodes of what seems like vertigo. She has had some fullness in her right ear as well. She has a history of tinnitus, but has not had any hearing loss. Examination is unremarkable. No neurologic focal findings. Screening CT was negative. Reviewing her records reveals that she has had paroxysmal atrial fibrillation in the past. She is not in nature fibrillation today. She is not on anticoagulation other than aspirin 81 mg. This puts her at risk for stroke. Neurology consultation appreciated. Will obtain MRI. If negative for stroke, can be discharged. If positive will need admission for workup. Will be signed out to oncoming ER physician to disposition.    Orpah Greek, MD 12/16/14 [phone]

## 2014-12-16 NOTE — ED Notes (Signed)
Attempted to stand pt, pt states she is unable to stand d/t dizziness.  Dr Winfred Leeds at bedside

## 2014-12-16 NOTE — Consult Note (Signed)
NEURO HOSPITALIST CONSULT NOTE    Reason for Consult: transient vertigo  HPI:                                                                                                                                          Jenna [name] is an 74 y.o. female with recent diagnosis of intermittent Afib by Holter monitor. Due to short duration and infrequency Cardiology did not feel she was candidate for [facility] and placed patient on ASA daily with close follow up. Patient has come to ED today due to intermittent dizziness and near syncope since Tuesday (4 days prior). She noted her heart was beating fast on Tuesday but felt fine on Wednesday.  On Thursday she again felt as though her heart was beating fast and had a elevated BP.  She also noted her right ear felt full.  She could not tell if she had decreased hearing in that ear as she already has poor hearing in the right ear and wears a hearing aid. That evening she noted when she would move she felt as if the room was spinning from left to right. The spinning would stop if she remained still but would reoccur if she moved.  This AM she continued to have the spinning sensation with movement and decided to come to ED.  She states she is still having the positional vertigo if she moves. She has no numbness or weakness in her extremities.   Past Medical History  Diagnosis Date  . RLS (restless legs syndrome)   . OSA (obstructive sleep apnea)   . Palpitations   . Fatigue   . Mild aortic stenosis   . Hypertension   . Hypothyroidism   . Cancer     melanoma    Past Surgical History  Procedure Laterality Date  . Lexiscan myoview  05/13/2012    No ECG changes. EKG negative for ischemia. No significant ischemia demonstrated.  . Transthoracic echocardiogram  02/18/2012    EF >55%, mild aortic stenosis  . Abdominal hysterectomy    . Cataract extraction      Family History  Problem Relation Age of Onset  . Hypertension Mother   . Diabetes  Mother   . Heart failure Father   . Arrhythmia Sister   . Hyperlipidemia Sister   . Hypertension Sister   . Healthy Brother   . Stroke Paternal Grandmother   . Cancer Paternal Grandfather   . Healthy Daughter   . Hypertension Son   . Hyperlipidemia Son      Social History:  reports that she has never smoked. She has never used smokeless tobacco. She reports that she does not drink alcohol or use illicit drugs.  Allergies  Allergen Reactions  . Crestor [Rosuvastatin]  Joints ached  . Lovastatin     MEDICATIONS:                                                                                                                     No current facility-administered medications for this encounter.   Current Outpatient Prescriptions  Medication Sig Dispense Refill  . acarbose (PRECOSE) 25 MG tablet Take 25 mg by mouth 3 (three) times daily.      [name] ALPRAZolam (XANAX) 0.25 MG tablet Take 1 tablet by mouth Twice daily.    [name] aspirin 81 MG tablet Take 81 mg by mouth daily.      [name] atorvastatin (LIPITOR) 40 MG tablet Take 1 tablet (40 mg total) by mouth daily. 30 tablet 6  . Calcium Carbonate-Vitamin D (CALCIUM + D PO) Take 600 mg by mouth 2 (two) times daily. Once a day    . CHERRY CONCENTRATE PO Take 4 oz by mouth 2 (two) times daily.    [name] diltiazem (CARDIZEM CD) 120 MG 24 hr capsule Take 1 capsule (120 mg total) by mouth daily. 30 capsule 6  . EUFLEXXA 20 MG/2ML SOLN As directed    . Ginger, Zingiber officinalis, (GINGER PO) Take 2 capsules by mouth daily.     [name] KLOR-CON M20 20 MEQ tablet Take 1 tablet by mouth as needed.     [name] levothyroxine (SYNTHROID, LEVOTHROID) 88 MCG tablet Take 88 mcg by mouth daily before breakfast.    . MAGNESIUM PO Take 1 tablet by mouth 2 (two) times daily.     . metoprolol tartrate (LOPRESSOR) 25 MG tablet 12.5mg  (1/2 tablet) by mouth twice daily (for 3 days then STOP)    . Multiple Vitamins-Calcium (ONE-A-DAY WOMENS PO) Once a day     . Multiple  Vitamins-Minerals (ICAPS AREDS FORMULA PO) Take 1 tablet by mouth 2 (two) times daily.    . naproxen sodium (ANAPROX) 220 MG tablet Take 220 mg by mouth 2 (two) times daily with a meal.    . Omega-3 Fatty Acids (FISH OIL) 1200 MG CAPS Take 1 at breakfast and 1 at supper    . omeprazole (PRILOSEC) 20 MG capsule Take 20 mg by mouth 2 (two) times daily.      [name] rOPINIRole (REQUIP) 0.5 MG tablet Take 2-3 daily at bedtime as needed 90 tablet 3  . torsemide (DEMADEX) 5 MG tablet Take 5-10 mg by mouth as needed.     . TURMERIC PO Take 2 tablets by mouth daily.         ROS:  History obtained from the patient  General ROS: negative for - chills, fatigue, fever, night sweats, weight gain or weight loss Psychological ROS: negative for - behavioral disorder, hallucinations, memory difficulties, mood swings or suicidal ideation Ophthalmic ROS: negative for - blurry vision, double vision, eye pain or loss of vision ENT ROS: negative for - epistaxis, nasal discharge, oral lesions, sore throat, tinnitus or vertigo Allergy and Immunology ROS: negative for - hives or itchy/watery eyes Hematological and Lymphatic ROS: negative for - bleeding problems, bruising or swollen lymph nodes Endocrine ROS: negative for - galactorrhea, hair pattern changes, polydipsia/polyuria or temperature intolerance Respiratory ROS: negative for - cough, hemoptysis, shortness of breath or wheezing Cardiovascular ROS: negative for - chest pain, dyspnea on exertion, edema or irregular heartbeat Gastrointestinal ROS: negative for - abdominal pain, diarrhea, hematemesis, nausea/vomiting or stool incontinence Genito-Urinary ROS: negative for - dysuria, hematuria, incontinence or urinary frequency/urgency Musculoskeletal ROS: negative for - joint swelling or muscular weakness Neurological ROS: as noted in  HPI Dermatological ROS: negative for rash and skin lesion changes   Blood pressure 148/72, pulse 56, temperature 97.5 F (36.4 C), temperature source Oral, resp. rate 16, height 5\' 6"  (1.676 m), weight 91.627 kg (202 lb), SpO2 97 %.   Neurologic Examination:                                                                                                      HEENT-  Normocephalic, no lesions, without obvious abnormality.  Normal external eye and conjunctiva.  Normal TM's bilaterally.  Normal auditory canals and external ears. Normal external nose, mucus membranes and septum.  Normal pharynx. Cardiovascular- S1, S2 normal, pulses palpable throughout   Lungs- chest clear, no wheezing, rales, normal symmetric air entry, Heart exam - S1, S2 normal, no murmur, no gallop, rate regular Abdomen- normal findings: bowel sounds normal Extremities- no edema Lymph-no adenopathy palpable Musculoskeletal-no joint tenderness, deformity or swelling Skin-warm and dry, no hyperpigmentation, vitiligo, or suspicious lesions  Neurological Examination Mental Status: Alert, oriented, thought content appropriate.  Speech fluent without evidence of aphasia.  Able to follow 3 step commands without difficulty. Cranial Nerves: II: Discs flat bilaterally; Visual fields grossly normal, pupils equal, round, reactive to light and accommodation III,IV, VI: ptosis not present, extra-ocular motions intact bilaterally V,VII: smile symmetric, facial light touch sensation normal bilaterally VIII: hearing normal bilaterally IX,X: uvula rises symmetrically XI: bilateral shoulder shrug XII: midline tongue extension Motor: Right : Upper extremity   3/5 (pain)   Left:     Upper extremity   5/5  Lower extremity   5/5     Lower extremity   5/5 Tone and bulk:normal tone throughout; no atrophy noted Sensory: Pinprick and light touch intact throughout, bilaterally Deep Tendon Reflexes: 2+ and symmetric throughout Plantars: Right:  downgoing   Left: downgoing Cerebellar: normal finger-to-nose,  and normal heel-to-shin test Gait: not tested due to safety      Lab Results: Basic Metabolic Panel:  Recent Labs Lab 12/16/14 1253 12/16/14 1305  NA 135 135  K 5.0 4.8  CL 101 100  CO2  24  --   GLUCOSE 114* 118*  BUN 12 18  CREATININE 0.71 0.70  CALCIUM 8.8  --     Liver Function Tests:  Recent Labs Lab 12/16/14 1253  AST 32  ALT 24  ALKPHOS 61  BILITOT 1.3*  PROT 6.3  ALBUMIN 3.6   No results for input(s): LIPASE, AMYLASE in the last 168 hours. No results for input(s): AMMONIA in the last 168 hours.  CBC:  Recent Labs Lab 12/16/14 1305  HGB 15.0  HCT 44.0    Cardiac Enzymes: No results for input(s): CKTOTAL, CKMB, CKMBINDEX, TROPONINI in the last 168 hours.  Lipid Panel: No results for input(s): CHOL, TRIG, HDL, CHOLHDL, VLDL, LDLCALC in the last 168 hours.  CBG: No results for input(s): GLUCAP in the last 168 hours.  Microbiology: No results found for this or any previous visit.  Coagulation Studies:  Recent Labs  12/16/14 1253 12/16/14 1328  LABPROT 12.1 13.0  INR 0.88 0.97    Imaging: Ct Head Wo Contrast  12/16/2014   CLINICAL DATA:  Intermittent dizziness  EXAM: CT HEAD WITHOUT CONTRAST  TECHNIQUE: Contiguous axial images were obtained from the base of the skull through the vertex without intravenous contrast.  COMPARISON:  01/30/2007  FINDINGS: The bony calvarium is intact. The ventricles are of normal size and configuration. No findings to suggest acute hemorrhage, acute infarction or space-occupying mass lesion are noted.  IMPRESSION: No acute intracranial abnormality noted.   Electronically Signed   By: Inez Catalina M.D.   On: 12/16/2014 15:29       Assessment and plan per attending neurologist  Jenna Quill PA-C [facility] [phone]  12/16/2014, 3:37 PM   Assessment/Plan:  74 YO female presenting with 3 day history of positional vertigo  which abates if she remains still.  Neuro exam is non-focal and shows no nystagmus. Patient does endores a history of right ear fullness prior to vertigo. Likely peripheral in origin however given her Afib and sensation of tachycardia, cannot rule out posterior circulation CVA.   Recommend: 1) MRI brain--if negative would not further stroke work up; if positive admit to medicine for further stroke evaluation 2) IF MRI negative, PT for vestibular rehab and trial of meclizine 25 mg TID PRN      Patient seen and examined together with physician assistant and I concur with the assessment and plan.  Dorian Pod, MD

## 2014-12-16 NOTE — ED Notes (Signed)
Pt assisted to stand and use bedside commode.  Pt able to stand, but reports dizziness.  States she does not feel safe to go home d/t dizziness.  Dr Winfred Leeds notified

## 2014-12-17 DIAGNOSIS — H81391 Other peripheral vertigo, right ear: Secondary | ICD-10-CM

## 2014-12-17 DIAGNOSIS — H81399 Other peripheral vertigo, unspecified ear: Secondary | ICD-10-CM

## 2014-12-17 LAB — GLUCOSE, CAPILLARY
Glucose-Capillary: 116 mg/dL — ABNORMAL HIGH (ref 70–99)
Glucose-Capillary: 71 mg/dL (ref 70–99)

## 2014-12-17 MED ORDER — MECLIZINE HCL 12.5 MG PO TABS
25.0000 mg | ORAL_TABLET | Freq: Two times a day (BID) | ORAL | Status: DC
  Administered 2014-12-17: 25 mg via ORAL
  Filled 2014-12-17: qty 2

## 2014-12-17 MED ORDER — PREDNISONE 20 MG PO TABS
40.0000 mg | ORAL_TABLET | Freq: Every day | ORAL | Status: DC
  Administered 2014-12-17 – 2014-12-20 (×4): 40 mg via ORAL
  Filled 2014-12-17 (×5): qty 2

## 2014-12-17 MED ORDER — ROPINIROLE HCL 1 MG PO TABS
0.5000 mg | ORAL_TABLET | Freq: Three times a day (TID) | ORAL | Status: DC | PRN
  Administered 2014-12-17 – 2014-12-19 (×4): 0.5 mg via ORAL
  Filled 2014-12-17 (×5): qty 0.5

## 2014-12-17 MED ORDER — MECLIZINE HCL 12.5 MG PO TABS: 25.0000 mg | ORAL_TABLET | Freq: Three times a day (TID) | ORAL | Status: DC | PRN

## 2014-12-17 NOTE — Evaluation (Signed)
Physical Therapy Evaluation  (see also Vestibular Evaluation in separate note) Patient Details Name: Jenna [name] MRN: [id number] DOB: 1941-04-11 Today's Date: 12/17/2014   History of Present Illness  Adm with several day history of dizziness which included vertigo and pre-syncope. Pt reports dizziness occurs with all movement and lasts for minutes once she stops moving (persists continuously when she is moving).   Clinical Impression  Pt admitted with above symptoms. Pt currently unsafe to ambulate due to severe vertigo. Pt currently with functional limitations due to the deficits listed below (see PT Problem List).  Pt will benefit from skilled PT to increase their independence and safety with mobility to allow discharge to the venue listed below.       Follow Up Recommendations CIR;Supervision/Assistance - 24 hour  (if neuritis/inflammation improves, may be able to do HHPT vestibular rehab)    Equipment Recommendations   (TBA as able to mobilize/further assess)    Recommendations for Other Services OT consult     Precautions / Restrictions Precautions Precautions: Fall      Mobility  Bed Mobility Overal bed mobility: Needs Assistance Bed Mobility: Rolling;Sidelying to Sit;Sit to Sidelying Rolling: Modified independent (Device/Increase time) Sidelying to sit: Min assist;HOB elevated (with rail)     Sit to sidelying: Mod assist General bed mobility comments: + rail; pt with vertigo up to 7/10 with mobility  Transfers Overall transfer level: Needs assistance Equipment used: 1 person hand held assist Transfers: Sit to/from Stand Sit to Stand: Min assist         General transfer comment: pt holding bed rail and PT blocking her knee and stabilizing her body (incr lateral sway upon standing)  Ambulation/Gait             General Gait Details: unable with +1 assist  Stairs            Wheelchair Mobility    Modified Rankin (Stroke Patients Only)        Balance Overall balance assessment: Needs assistance Sitting-balance support: Bilateral upper extremity supported;Feet supported Sitting balance-Leahy Scale: Poor     Standing balance support: Bilateral upper extremity supported Standing balance-Leahy Scale: Poor                               Pertinent Vitals/Pain Pain Assessment: 0-10 Pain Score: 5  Pain Location: Rt shoulder Pain Descriptors / Indicators: Dull;Aching Pain Intervention(s): Limited activity within patient's tolerance;Monitored during session;Repositioned    Home Living Family/patient expects to be discharged to:: Private residence Living Arrangements: Alone   Type of Home: House Home Access: Stairs to enter Entrance Stairs-Rails: Left Entrance Stairs-Number of Steps: 1+1 Home Layout: One level Home Equipment: Grab bars - toilet;Grab bars - tub/shower;Shower seat;Cane - single point;Bedside commode      Prior Function Level of Independence: Independent               Hand Dominance   Dominant Hand: Right    Extremity/Trunk Assessment   Upper Extremity Assessment: Overall WFL for tasks assessed           Lower Extremity Assessment: Overall WFL for tasks assessed      Cervical / Trunk Assessment: Normal  Communication   Communication: HOH (Rt hearing aid)  Cognition Arousal/Alertness: Awake/alert Behavior During Therapy: WFL for tasks assessed/performed Overall Cognitive Status: Within Functional Limits for tasks assessed  General Comments General comments (skin integrity, edema, etc.): See also vestibular evaluation in separate note    Exercises Other Exercises Other Exercises: pt issued handout with x 1 exercises, however currently feel pt is too acute to begin exercises. Target "A's" placed on pt's walls for visual targets.      Assessment/Plan    PT Assessment Patient needs continued PT services  PT Diagnosis Difficulty  walking;Other (comment) (vertigo)   PT Problem List Decreased activity tolerance;Decreased balance;Decreased mobility;Decreased knowledge of use of DME  PT Treatment Interventions DME instruction;Gait training;Stair training;Functional mobility training;Therapeutic activities;Therapeutic exercise;Balance training;Neuromuscular re-education;Patient/family education   PT Goals (Current goals can be found in the Care Plan section) Acute Rehab PT Goals Patient Stated Goal: feel better and able to care for herself PT Goal Formulation: With patient Time For Goal Achievement: 12/24/14 Potential to Achieve Goals: Good    Frequency Min 5X/week   Barriers to discharge Decreased caregiver support lives alone    Co-evaluation               End of Session Equipment Utilized During Treatment: Gait belt Activity Tolerance: Treatment limited secondary to medical complications (Comment) (vertigo, nausea) Patient left: in bed;with call bell/phone within reach;with bed alarm set Nurse Communication: Mobility status;Other (comment) (use of targets)    Functional Assessment Tool Used: clinical judgement Functional Limitation: Mobility: Walking and moving around Mobility: Walking and Moving Around Current Status [phone]): At least 80 percent but less than 100 percent impaired, limited or restricted Mobility: Walking and Moving Around Goal Status [phone]): At least 1 percent but less than 20 percent impaired, limited or restricted    Time: 1050-1146 PT Time Calculation (min) (ACUTE ONLY): 56 min   Charges:   PT Evaluation $Initial PT Evaluation Tier I: 1 Procedure PT Treatments $Therapeutic Activity: 23-37 mins   PT G Codes:   PT G-Codes **NOT FOR INPATIENT CLASS** Functional Assessment Tool Used: clinical judgement Functional Limitation: Mobility: Walking and moving around Mobility: Walking and Moving Around Current Status (W1027): At least 80 percent but less than 100 percent impaired,  limited or restricted Mobility: Walking and Moving Around Goal Status [phone]): At least 1 percent but less than 20 percent impaired, limited or restricted    Analese Sovine 12/17/2014, 12:28 PM Pager [phone]

## 2014-12-17 NOTE — Evaluation (Signed)
Occupational Therapy Evaluation Patient Details Name: Jenna [name] MRN: [id number] DOB: [birth year] Today's Date: 12/17/2014    History of Present Illness Adm with several day history of dizziness which included vertigo and pre-syncope. Pt reports dizziness occurs with all movement and lasts for minutes once she stops moving (persists continuously when she is moving).    Clinical Impression   Pt admitted with above. She demonstrates the below listed deficits and will benefit from continued OT to maximize safety and independence with BADLs.  Pt presents to OT with vertigo and impaired balance.  She currently requires min A with BADLs, but must keep gaze fixated on target.  If she attempts to shift her gaze, dizziness increases and she is unable to complete the tasks.  She lives alone.  Based on current functional performance, recommend CIR.       Follow Up Recommendations  CIR;Supervision/Assistance - 24 hour    Equipment Recommendations       Recommendations for Other Services Rehab consult     Precautions / Restrictions Precautions Precautions: Fall      Mobility Bed Mobility Overal bed mobility: Needs Assistance Bed Mobility: Sidelying to Sit;Rolling Rolling: Modified independent (Device/Increase time) Sidelying to sit: Supervision       General bed mobility comments: Pt used rail.  Maintained gaze fixation throughout movement with no increase in dizziness   Transfers Overall transfer level: Needs assistance   Transfers: Sit to/from Stand;Stand Pivot Transfers Sit to Stand: Min assist Stand pivot transfers: Min assist       General transfer comment: Pt able to move sit to stand, and ambulate forward and backwards 6' x 2 while keeping gaze fixated on target with min A     Balance Overall balance assessment: Needs assistance Sitting-balance support: Feet supported Sitting balance-Leahy Scale: Good Sitting balance - Comments: pt able to don/doff socks while  keeping gaze fixated    Standing balance support: Bilateral upper extremity supported Standing balance-Leahy Scale: Poor                              ADL Overall ADL's : Needs assistance/impaired Eating/Feeding: Independent   Grooming: Wash/dry hands;Wash/dry face;Oral care;Brushing hair;Set up;Sitting   Upper Body Bathing: Supervision/ safety;Set up;Sitting   Lower Body Bathing: Minimal assistance;Sit to/from stand   Upper Body Dressing : Supervision/safety;Set up;Sitting   Lower Body Dressing: Minimal assistance;Sit to/from stand   Toilet Transfer: Minimal assistance;Ambulation;Comfort height toilet;RW;BSC   Toileting- Clothing Manipulation and Hygiene: Sit to/from stand;Moderate assistance       Functional mobility during ADLs: Minimal assistance;Rolling walker General ADL Comments: While keeping gaze fixated on target, pt was able to don/doff socks with no increased dizziness, perform toilet transfer      [place]?: No apparent visual deficits   Perception     Praxis      Pertinent Vitals/Pain Pain Assessment: 0-10 Pain Score: 5  Pain Location: Rt shoulder  Pain Descriptors / Indicators: Aching Pain Intervention(s): Monitored during session     Hand Dominance Right   Extremity/Trunk Assessment Upper Extremity Assessment Upper Extremity Assessment: RUE deficits/detail RUE Deficits / Details: Pt reports pain Rt shoulder present for the past 5 weeks.  She tripped over an ottomon at home and sustained a fall.  She has an apt with Dr Ninfa Linden on Monday 12/19/14   Lower Extremity Assessment Lower Extremity Assessment: Overall WFL for tasks assessed   Cervical / Trunk Assessment Cervical / Trunk  Assessment: Normal   Communication Communication Communication: HOH (Rt hearing aid)   Cognition Arousal/Alertness: Awake/alert Behavior During Therapy: WFL for tasks assessed/performed Overall Cognitive Status: Within Functional Limits  for tasks assessed                     General Comments       Exercises       Shoulder Instructions      Home Living Family/patient expects to be discharged to:: Private residence Living Arrangements: Alone   Type of Home: House Home Access: Stairs to enter Technical brewer of Steps: 1+1 Entrance Stairs-Rails: Left Home Layout: One level     Bathroom Shower/Tub: Occupational psychologist: [place]: Grab bars - toilet;Grab bars - tub/shower;Shower seat;Cane - single point;Bedside commode          Prior Functioning/Environment Level of Independence: Independent             OT Diagnosis: Generalized weakness;Other (comment) (vertigo )   OT Problem List: Decreased strength;Decreased activity tolerance;Impaired balance (sitting and/or standing);Decreased knowledge of use of DME or AE   OT Treatment/Interventions: Self-care/ADL training;DME and/or AE instruction;Therapeutic activities;Patient/family education;Balance training    OT Goals(Current goals can be found in the care plan section) Acute Rehab OT Goals Patient Stated Goal: to get better  OT Goal Formulation: With patient Time For Goal Achievement: 12/31/14 Potential to Achieve Goals: Good ADL Goals Pt Will Perform Grooming: with modified independence;sitting Pt Will Perform Upper Body Bathing: with modified independence;sitting Pt Will Perform Lower Body Bathing: with modified independence;sit to/from stand Pt Will Perform Upper Body Dressing: with modified independence;sitting Pt Will Perform Lower Body Dressing: with modified independence;sit to/from stand Pt Will Transfer to Toilet: with modified independence;ambulating;regular height toilet;bedside commode Pt Will Perform Toileting - Clothing Manipulation and hygiene: with modified independence;sit to/from stand Pt Will Perform Tub/Shower Transfer: Shower transfer;with supervision;rolling walker;grab bars;shower  seat  OT Frequency: Min 2X/week   Barriers to D/C: Decreased caregiver support          Co-evaluation              End of Session Equipment Utilized During Treatment: Surveyor, mining Communication: Mobility status  Activity Tolerance: Patient tolerated treatment well Patient left: in chair;with call bell/phone within reach   Time: 1535-1601 OT Time Calculation (min): 26 min Charges:  OT General Charges $OT Visit: 1 Procedure OT Evaluation $Initial OT Evaluation Tier I: 1 Procedure OT Treatments $Self Care/Home Management : 8-22 mins G-Codes: OT G-codes **NOT FOR INPATIENT CLASS** Functional Limitation: Self care Self Care Current Status (W4315): At least 20 percent but less than 40 percent impaired, limited or restricted Self Care Goal Status (Q0086): At least 1 percent but less than 20 percent impaired, limited or restricted  Rafaella Kole M 12/17/2014, 4:40 PM

## 2014-12-17 NOTE — Progress Notes (Signed)
UR completed 

## 2014-12-17 NOTE — Progress Notes (Addendum)
[facility] PROGRESS NOTE  Jenna [name] LTJ:[id number] DOB: Jan 07, 1941 DOA: 12/16/2014 PCP: Chesley Noon, MD  Assessment/Plan: 1. Peripheral Vertigo: BPPV vs labrynthitis -MRI negative -continue meclizine -PT for vestibular rehab -d/w dr.Camillo, ok to use Prednisone for 2-3days  2. RLS -continue requip  3. H/o P.Afib -very low burden, on ASA per Cards  DVt proph: Hep SQ  Code Status: Full COde Family Communication: none at bedside Disposition Plan: home tomorrow if stable   Consultants:  Neuro  HPI/Subjective: Still with vertigo when position changes  Objective: Filed Vitals:   12/17/14 0900  BP: 134/62  Pulse: 67  Temp:   Resp: 18   No intake or output data in the 24 hours ending 12/17/14 1132 Filed Weights   12/16/14 1114 12/16/14 2249  Weight: 91.627 kg (202 lb) 93.214 kg (205 lb 8 oz)    Exam:   General:  AAOx3  Cardiovascular:S1S2/RRR  Respiratory: CTAB  Abdomen: soft, NT, BS present  Musculoskeletal: no edema c/c  Neuro: non focal   Data Reviewed: Basic Metabolic Panel:  Recent Labs Lab 12/16/14 1253 12/16/14 1305  NA 135 135  K 5.0 4.8  CL 101 100  CO2 24  --   GLUCOSE 114* 118*  BUN 12 18  CREATININE 0.71 0.70  CALCIUM 8.8  --    Liver Function Tests:  Recent Labs Lab 12/16/14 1253  AST 32  ALT 24  ALKPHOS 61  BILITOT 1.3*  PROT 6.3  ALBUMIN 3.6   No results for input(s): LIPASE, AMYLASE in the last 168 hours. No results for input(s): AMMONIA in the last 168 hours. CBC:  Recent Labs Lab 12/16/14 1305  HGB 15.0  HCT 44.0   Cardiac Enzymes: No results for input(s): CKTOTAL, CKMB, CKMBINDEX, TROPONINI in the last 168 hours. BNP (last 3 results) No results for input(s): BNP in the last 8760 hours.  ProBNP (last 3 results) No results for input(s): PROBNP in the last 8760 hours.  CBG:  Recent Labs Lab 12/16/14 1415 12/17/14 0646  GLUCAP 116* 71    No results found for this or any previous  visit (from the past 240 hour(s)).   Studies: Ct Head Wo Contrast  12/16/2014   CLINICAL DATA:  Intermittent dizziness  EXAM: CT HEAD WITHOUT CONTRAST  TECHNIQUE: Contiguous axial images were obtained from the base of the skull through the vertex without intravenous contrast.  COMPARISON:  01/30/2007  FINDINGS: The bony calvarium is intact. The ventricles are of normal size and configuration. No findings to suggest acute hemorrhage, acute infarction or space-occupying mass lesion are noted.  IMPRESSION: No acute intracranial abnormality noted.   Electronically Signed   By: Inez Catalina M.D.   On: 12/16/2014 15:29   Mr Brain Wo Contrast  12/16/2014   CLINICAL DATA:  Vertigo. Intermittent dizziness near syncope for the past 3 days. Tachycardia. Fullness in the right ear with possible decreased hearing.  EXAM: MRI HEAD WITHOUT CONTRAST  TECHNIQUE: Multiplanar, multiecho pulse sequences of the brain and surrounding structures were obtained without intravenous contrast.  COMPARISON:  CT head without contrast from the same day. MRI brain 01/30/2007.  FINDINGS: Mild atrophy and white matter changes are within normal limits for age. No acute infarct, hemorrhage, or mass lesion is present. The ventricles are proportionate to the degree of atrophy. No significant extraaxial fluid collection is present.  Flow is present in the major intracranial arteries. The patient is status post bilateral lens replacements. The globes and orbits are otherwise intact. The paranasal  sinuses and mastoid air cells are clear.  Skullbase is unremarkable. Midline structures are within normal limits.  IMPRESSION: Normal MRI of the brain for age.   Electronically Signed   By: San Morelle M.D.   On: 12/16/2014 19:32    Scheduled Meds: . acarbose  25 mg Oral TID AC  . aspirin  81 mg Oral Daily  . atorvastatin  40 mg Oral Daily  . diltiazem  120 mg Oral Daily  . heparin  5,000 Units Subcutaneous 3 times per day  . levothyroxine   88 mcg Oral QAC breakfast  . magnesium oxide  400 mg Oral Daily  . meclizine  25 mg Oral BID  . naproxen  500 mg Oral BID WC  . pantoprazole  80 mg Oral Daily  . sertraline  50 mg Oral Daily   Continuous Infusions:  Antibiotics Given (last 72 hours)    None      Principal Problem:   Peripheral vertigo Active Problems:   Vertigo    Time spent: 48min    [name]  [facility] Pager [phone]. If 7PM-7AM, please contact night-coverage at [email or web address], password [facility] 12/17/2014, 11:32 AM

## 2014-12-17 NOTE — Progress Notes (Signed)
NEURO HOSPITALIST PROGRESS NOTE   SUBJECTIVE:                                                                                                                        Remains vertiginous. Stated that the dizziness occurs when she moves her head and goes away when the head is still.  Fullness of the right ear. Had a cold mid January. No HA, double vision, difficulty swallowing, slurred speech, language or vision impairment. MRI brain reviewed by myself showed no acute abnormality. On meclizine.  OBJECTIVE:                                                                                                                           Vital signs in last 24 hours: Temp:  [97.5 F (36.4 C)-98.6 F (37 C)] 98.4 F (36.9 C) (03/12 0515) Pulse Rate:  [50-72] 71 (03/12 0700) Resp:  [13-21] 18 (03/12 0700) BP: (115-217)/(52-105) 177/75 mmHg (03/12 0700) SpO2:  [69 %-100 %] 99 % (03/12 0700) FiO2 (%):  [21 %] 21 % (03/11 1136) Weight:  [91.627 kg (202 lb)-93.214 kg (205 lb 8 oz)] 93.214 kg (205 lb 8 oz) (03/11 2249)  Intake/Output from previous day:   Intake/Output this shift:   Nutritional status: Diet Carb Modified  Past Medical History  Diagnosis Date  . RLS (restless legs syndrome)   . OSA (obstructive sleep apnea)   . Palpitations   . Fatigue   . Mild aortic stenosis   . Hypertension   . Hypothyroidism   . Cancer     melanoma   Physical exam: HEENT- Normocephalic, no lesions, without obvious abnormality. Normal external eye and conjunctiva. Normal TM's bilaterally. Normal auditory canals and external ears. Normal external nose, mucus membranes and septum. Normal pharynx. Cardiovascular- S1, S2 normal, pulses palpable throughout  Lungs- chest clear, no wheezing, rales, normal symmetric air entry, Heart exam - S1, S2 normal, no murmur, no gallop, rate regular Abdomen- normal findings: bowel sounds normal Extremities- no edema Lymph-no  adenopathy palpable Musculoskeletal-no joint tenderness, deformity or swelling Skin-warm and dry, no hyperpigmentation, vitiligo, or suspicious lesions  Neurologic Exam:  Alert, oriented, thought content appropriate. Speech fluent without evidence of aphasia. Able to follow 3 step commands without  difficulty. Cranial Nerves: II: Discs flat bilaterally; Visual fields grossly normal, pupils equal, round, reactive to light and accommodation III,IV, VI: ptosis not present, extra-ocular motions intact bilaterally V,VII: smile symmetric, facial light touch sensation normal bilaterally VIII: hearing normal bilaterally IX,X: uvula rises symmetrically XI: bilateral shoulder shrug XII: midline tongue extension Motor: Right :Upper extremity 3/5(pain)Left: Upper extremity 5/5 Lower extremity 5/5Lower extremity 5/5 Tone and bulk:normal tone throughout; no atrophy noted Sensory: Pinprick and light touch intact throughout, bilaterally Deep Tendon Reflexes: 2+ and symmetric throughout Plantars: Right: downgoingLeft: downgoing Cerebellar: normal finger-to-nose, and normal heel-to-shin test Gait: not tested due to safety  Lab Results: No results found for: CHOL Lipid Panel No results for input(s): CHOL, TRIG, HDL, CHOLHDL, VLDL, LDLCALC in the last 72 hours.  Studies/Results: Ct Head Wo Contrast  12/16/2014   CLINICAL DATA:  Intermittent dizziness  EXAM: CT HEAD WITHOUT CONTRAST  TECHNIQUE: Contiguous axial images were obtained from the base of the skull through the vertex without intravenous contrast.  COMPARISON:  01/30/2007  FINDINGS: The bony calvarium is intact. The ventricles are of normal size and configuration. No findings to suggest acute hemorrhage, acute infarction or space-occupying mass lesion are noted.  IMPRESSION: No acute intracranial abnormality  noted.   Electronically Signed   By: Inez Catalina M.D.   On: 12/16/2014 15:29   Mr Brain Wo Contrast  12/16/2014   CLINICAL DATA:  Vertigo. Intermittent dizziness near syncope for the past 3 days. Tachycardia. Fullness in the right ear with possible decreased hearing.  EXAM: MRI HEAD WITHOUT CONTRAST  TECHNIQUE: Multiplanar, multiecho pulse sequences of the brain and surrounding structures were obtained without intravenous contrast.  COMPARISON:  CT head without contrast from the same day. MRI brain 01/30/2007.  FINDINGS: Mild atrophy and white matter changes are within normal limits for age. No acute infarct, hemorrhage, or mass lesion is present. The ventricles are proportionate to the degree of atrophy. No significant extraaxial fluid collection is present.  Flow is present in the major intracranial arteries. The patient is status post bilateral lens replacements. The globes and orbits are otherwise intact. The paranasal sinuses and mastoid air cells are clear.  Skullbase is unremarkable. Midline structures are within normal limits.  IMPRESSION: Normal MRI of the brain for age.   Electronically Signed   By: San Morelle M.D.   On: 12/16/2014 19:32    MEDICATIONS                                                                                                                        Scheduled: . acarbose  25 mg Oral TID AC  . aspirin  81 mg Oral Daily  . atorvastatin  40 mg Oral Daily  . diltiazem  120 mg Oral Daily  . heparin  5,000 Units Subcutaneous 3 times per day  . levothyroxine  88 mcg Oral QAC breakfast  . magnesium oxide  400 mg Oral Daily  . naproxen  500 mg Oral BID WC  .  pantoprazole  80 mg Oral Daily  . sertraline  50 mg Oral Daily    ASSESSMENT/PLAN:                                                                                                           74 y/o with new onset vertigo, unremarkable neuroexam, and MRI brain without acute abnormality. Patient's vertigo has a  pattern highly consistent with peripheral vertigo/BPPV. Vestibular exercises, meclizine. No further inpatient neurological intervention needed. Will sign off.  Dorian Pod, MD [facility] [phone]  12/17/2014, 8:56 AM

## 2014-12-17 NOTE — Progress Notes (Signed)
Vestibular Evaluation by PT (Addendum to PT evaluation)    12/17/14 1135  Vestibular Assessment  General Observation Reports head cold in January and recent "full feeling" in Rt ear "like there was water in my ear"  Symptom Behavior  Type of Dizziness Spinning  Frequency of Dizziness any movement  Duration of Dizziness minutes, continuous if moving   Aggravating Factors Activity in general;Moving eyes;Spontaneous onset;Turning body quickly;Turning head quickly;Turning head sideways;Supine to sit;Sit to stand;Rolling to right;Rolling to left  Relieving Factors Head stationary  Occulomotor Exam  Occulomotor Alignment Normal  Spontaneous Absent  Gaze-induced Absent  Smooth Pursuits Saccades  Saccades Slow;Poor trajectory  Vestibulo-Occular Reflex  VOR 1 Head Only (x 1 viewing) only able to do very slowly x 10 seconds with incr spinning 7/10  VOR to Slow Head Movement Positive right (?positive Left)  Auditory  Comments wears hearing aid in Rt ear (for years)  Positional Sensitivities  Sit to Supine 3  Supine to Sitting 3   Pt reports vertigo/spinning persists for minutes once she stops moving (and persists the entire time she is moving). Closing her eyes makes spinning worse.  Educated pt on visual compensation technique which decreased her symptoms (7/10 to 4/10) with supine to sit and sit to stand.   Spoke with Dr. Broadus John re: ?labyrinthitis vs vestibular neuritis and need for ongoing vestibular rehab. Ultimately the rehab/exercises are more effective WITHOUT the use of meclizine and would prefer the meclizine be ordered for prn (not scheduled). Pt is very unsteady, lives alone (see additional PT evaluation for details).  12/17/2014 Barry Brunner, PT Pager: [phone]

## 2014-12-18 ENCOUNTER — Encounter (HOSPITAL_COMMUNITY): Payer: Self-pay | Admitting: General Practice

## 2014-12-18 LAB — GLUCOSE, CAPILLARY: Glucose-Capillary: 152 mg/dL — ABNORMAL HIGH (ref 70–99)

## 2014-12-18 MED ORDER — ATORVASTATIN CALCIUM 40 MG PO TABS
40.0000 mg | ORAL_TABLET | Freq: Every day | ORAL | Status: DC
  Administered 2014-12-18 – 2014-12-19 (×2): 40 mg via ORAL
  Filled 2014-12-18 (×3): qty 1

## 2014-12-18 MED ORDER — PANTOPRAZOLE SODIUM 40 MG PO TBEC
80.0000 mg | DELAYED_RELEASE_TABLET | Freq: Every day | ORAL | Status: DC
  Administered 2014-12-18 – 2014-12-19 (×2): 80 mg via ORAL
  Filled 2014-12-18 (×2): qty 2

## 2014-12-18 MED ORDER — OMEGA-3-ACID ETHYL ESTERS 1 G PO CAPS
1.0000 g | ORAL_CAPSULE | Freq: Two times a day (BID) | ORAL | Status: DC
  Administered 2014-12-18 – 2014-12-20 (×5): 1 g via ORAL
  Filled 2014-12-18 (×5): qty 1

## 2014-12-18 NOTE — Progress Notes (Signed)
[facility] PROGRESS NOTE  TONIESHA ZELLNER KZS:[id number] DOB: [birth year] DOA: 12/16/2014 PCP: Chesley Noon, MD  Assessment/Plan: 1. Peripheral Vertigo: labrynthitis vs vestibular neuritis -appreciate PT input -MRI negative -continue Prednisone x2-3days, meclizine PRN -Neuro consult appreciated -CIR consult  2. RLS -continue requip  3. H/o P.Afib -very low burden, on ASA per Cards  DVt proph: Hep SQ  Code Status: Full COde Family Communication: none at bedside Disposition Plan: CIr consulted   Consultants:  Neuro  HPI/Subjective: Still with vertigo when position changes, although improving  Objective: Filed Vitals:   12/18/14 0539  BP: 173/55  Pulse: 58  Temp: 97.8 F (36.6 C)  Resp: 18   No intake or output data in the 24 hours ending 12/18/14 0906 Filed Weights   12/16/14 1114 12/16/14 2249  Weight: 91.627 kg (202 lb) 93.214 kg (205 lb 8 oz)    Exam:   General:  AAOx3  Cardiovascular:S1S2/RRR  Respiratory: CTAB  Abdomen: soft, NT, BS present  Musculoskeletal: no edema c/c  Neuro: non focal   Data Reviewed: Basic Metabolic Panel:  Recent Labs Lab 12/16/14 1253 12/16/14 1305  NA 135 135  K 5.0 4.8  CL 101 100  CO2 24  --   GLUCOSE 114* 118*  BUN 12 18  CREATININE 0.71 0.70  CALCIUM 8.8  --    Liver Function Tests:  Recent Labs Lab 12/16/14 1253  AST 32  ALT 24  ALKPHOS 61  BILITOT 1.3*  PROT 6.3  ALBUMIN 3.6   No results for input(s): LIPASE, AMYLASE in the last 168 hours. No results for input(s): AMMONIA in the last 168 hours. CBC:  Recent Labs Lab 12/16/14 1305  HGB 15.0  HCT 44.0   Cardiac Enzymes: No results for input(s): CKTOTAL, CKMB, CKMBINDEX, TROPONINI in the last 168 hours. BNP (last 3 results) No results for input(s): BNP in the last 8760 hours.  ProBNP (last 3 results) No results for input(s): PROBNP in the last 8760 hours.  CBG:  Recent Labs Lab 12/16/14 1415 12/17/14 0646  GLUCAP  116* 71    No results found for this or any previous visit (from the past 240 hour(s)).   Studies: Ct Head Wo Contrast  12/16/2014   CLINICAL DATA:  Intermittent dizziness  EXAM: CT HEAD WITHOUT CONTRAST  TECHNIQUE: Contiguous axial images were obtained from the base of the skull through the vertex without intravenous contrast.  COMPARISON:  01/30/2007  FINDINGS: The bony calvarium is intact. The ventricles are of normal size and configuration. No findings to suggest acute hemorrhage, acute infarction or space-occupying mass lesion are noted.  IMPRESSION: No acute intracranial abnormality noted.   Electronically Signed   By: Inez Catalina M.D.   On: 12/16/2014 15:29   Mr Brain Wo Contrast  12/16/2014   CLINICAL DATA:  Vertigo. Intermittent dizziness near syncope for the past 3 days. Tachycardia. Fullness in the right ear with possible decreased hearing.  EXAM: MRI HEAD WITHOUT CONTRAST  TECHNIQUE: Multiplanar, multiecho pulse sequences of the brain and surrounding structures were obtained without intravenous contrast.  COMPARISON:  CT head without contrast from the same day. MRI brain 01/30/2007.  FINDINGS: Mild atrophy and white matter changes are within normal limits for age. No acute infarct, hemorrhage, or mass lesion is present. The ventricles are proportionate to the degree of atrophy. No significant extraaxial fluid collection is present.  Flow is present in the major intracranial arteries. The patient is status post bilateral lens replacements. The globes and orbits are otherwise  intact. The paranasal sinuses and mastoid air cells are clear.  Skullbase is unremarkable. Midline structures are within normal limits.  IMPRESSION: Normal MRI of the brain for age.   Electronically Signed   By: San Morelle M.D.   On: 12/16/2014 19:32    Scheduled Meds: . acarbose  25 mg Oral TID AC  . aspirin  81 mg Oral Daily  . atorvastatin  40 mg Oral Daily  . diltiazem  120 mg Oral Daily  . heparin   5,000 Units Subcutaneous 3 times per day  . levothyroxine  88 mcg Oral QAC breakfast  . magnesium oxide  400 mg Oral Daily  . naproxen  500 mg Oral BID WC  . omega-3 acid ethyl esters  1 g Oral BID  . pantoprazole  80 mg Oral Daily  . predniSONE  40 mg Oral Q breakfast  . sertraline  50 mg Oral Daily   Continuous Infusions:  Antibiotics Given (last 72 hours)    None      Principal Problem:   Peripheral vertigo Active Problems:   Vertigo    Time spent: 45min    [place] [facility] Pager [phone]. If 7PM-7AM, please contact night-coverage at [email or web address], password [facility] 12/18/2014, 9:06 AM

## 2014-12-18 NOTE — Progress Notes (Signed)
Utilization review completed.  P.J. Gregg Winchell,RN,BSN Case Manager 

## 2014-12-18 NOTE — Progress Notes (Signed)
Physical Therapy Treatment Patient Details Name: Jenna [name] MRN: [id number] DOB: 11/21/1940 Today's Date: 12/18/2014    History of Present Illness Adm with several day history of dizziness which included vertigo and pre-syncope. Pt reports dizziness occurs with all movement and lasts for minutes once she stops moving (persists continuously when she is moving).     PT Comments    Patient making progress with mobility, though continues to have dizziness impacting mobility/balance.  Follow Up Recommendations  CIR;Supervision/Assistance - 24 hour     Equipment Recommendations  Rolling walker with 5" wheels    Recommendations for Other Services       Precautions / Restrictions Precautions Precautions: Fall Restrictions Weight Bearing Restrictions: No    Mobility  Bed Mobility                  Transfers Overall transfer level: Needs assistance Equipment used: Rolling walker (2 wheeled) Transfers: Sit to/from Stand Sit to Stand: Min assist         General transfer comment: Verbal cues for hand placement and for gaze stabilization on target during stance.  Patient looked away from target on initial standing and lost balance posteriorly.  Repeated with use of target without loss of balance.  Able to perform from chair and toilet.  Ambulation/Gait Ambulation/Gait assistance: Min assist Ambulation Distance (Feet): 75 Feet (60' in hallway and 15' from bathroom) Assistive device: Rolling walker (2 wheeled) Gait Pattern/deviations: Step-through pattern;Decreased stride length;Staggering left;Staggering right Gait velocity: Slow Gait velocity interpretation: Below normal speed for age/gender General Gait Details: Verbal cues for safe use of RW.  Cues to use gaze stabilization during gait, especially during turns.  Patient continued to have loss of balance due to dizziness.    Stairs            Wheelchair Mobility    Modified Rankin (Stroke Patients Only)        Balance           Standing balance support: Bilateral upper extremity supported Standing balance-Leahy Scale: Poor                      Cognition Arousal/Alertness: Awake/alert Behavior During Therapy: WFL for tasks assessed/performed Overall Cognitive Status: Within Functional Limits for tasks assessed                      Exercises Other Exercises Other Exercises: Initiated x1 exercises horizontally - patient tolerating 15 seconds. Other Exercises: Provided AAROM exercises for Rt shoulder using Lt UE to assist.    General Comments        Pertinent Vitals/Pain Pain Assessment: 0-10 Pain Score: 5  Pain Location: Rt shoulder Pain Descriptors / Indicators: Aching;Sore Pain Intervention(s): Monitored during session;Repositioned    Home Living                      Prior Function            PT Goals (current goals can now be found in the care plan section) Acute Rehab PT Goals Patient Stated Goal: to get better  Progress towards PT goals: Progressing toward goals    Frequency  Min 5X/week    PT Plan Current plan remains appropriate    Co-evaluation             End of Session Equipment Utilized During Treatment: Gait belt Activity Tolerance: Treatment limited secondary to medical complications (Comment) (Vertigo) Patient left: in chair;with call  bell/phone within reach     Time: 1445-1522 and 15:37-15:44 PT Time Calculation (min) (ACUTE ONLY): 37 min + 7 min = 44 min total  Charges:  $Gait Training: 23-37 mins $Therapeutic Activity: 8-22 mins                    G Codes:      Despina Pole 01-13-2015, 7:23 PM Carita Pian. Sanjuana Kava, [place] Pager [phone]

## 2014-12-19 DIAGNOSIS — H81393 Other peripheral vertigo, bilateral: Secondary | ICD-10-CM

## 2014-12-19 LAB — GLUCOSE, CAPILLARY
Glucose-Capillary: 96 mg/dL (ref 70–99)
Glucose-Capillary: 154 mg/dL — ABNORMAL HIGH (ref 70–99)
Glucose-Capillary: 185 mg/dL — ABNORMAL HIGH (ref 70–99)
Glucose-Capillary: 135 mg/dL — ABNORMAL HIGH (ref 70–99)

## 2014-12-19 MED ORDER — ROPINIROLE HCL 1 MG PO TABS: 0.5000 mg | ORAL_TABLET | Freq: Three times a day (TID) | ORAL | Status: DC | PRN

## 2014-12-19 MED ORDER — AMLODIPINE BESYLATE 10 MG PO TABS
10.0000 mg | ORAL_TABLET | Freq: Every day | ORAL | Status: DC
  Administered 2014-12-19 – 2014-12-20 (×2): 10 mg via ORAL
  Filled 2014-12-19 (×2): qty 1

## 2014-12-19 NOTE — Progress Notes (Signed)
UR completed 

## 2014-12-19 NOTE — Progress Notes (Signed)
[facility] PROGRESS NOTE  [name] LNL:[id number] DOB: 1941-09-26 DOA: 12/16/2014 PCP: Chesley Noon, MD  Assessment/Plan: 1. Severe Peripheral Vertigo: labrynthitis vs vestibular neuritis -was very dizzy with minimal movement for 2-3days, slowly starting to improve -appreciate PT input -MRI negative -continue Prednisone x2 more days, meclizine PRN -Neuro consult appreciated -CIR consult today, CIR vs ST rehab  2. RLS -continue requip  3. H/o P.Afib -very low burden, on ASA per Cards  4. HTN -add amlodipine  DVt proph: Hep SQ  Code Status: Full COde Family Communication: none at bedside Disposition Plan: CIr consulted   Consultants:  Neuro  HPI/Subjective: Still with vertigo when position changes, although improving  Objective: Filed Vitals:   12/19/14 0554  BP: 189/74  Pulse: 54  Temp: 98.2 F (36.8 C)  Resp: 20   No intake or output data in the 24 hours ending 12/19/14 0930 Filed Weights   12/16/14 1114 12/16/14 2249  Weight: 91.627 kg (202 lb) 93.214 kg (205 lb 8 oz)    Exam:   General:  AAOx3  Cardiovascular:S1S2/RRR  Respiratory: CTAB  Abdomen: soft, NT, BS present  Musculoskeletal: no edema c/c  Neuro: non focal   Data Reviewed: Basic Metabolic Panel:  Recent Labs Lab 12/16/14 1253 12/16/14 1305  NA 135 135  K 5.0 4.8  CL 101 100  CO2 24  --   GLUCOSE 114* 118*  BUN 12 18  CREATININE 0.71 0.70  CALCIUM 8.8  --    Liver Function Tests:  Recent Labs Lab 12/16/14 1253  AST 32  ALT 24  ALKPHOS 61  BILITOT 1.3*  PROT 6.3  ALBUMIN 3.6   No results for input(s): LIPASE, AMYLASE in the last 168 hours. No results for input(s): AMMONIA in the last 168 hours. CBC:  Recent Labs Lab 12/16/14 1305  HGB 15.0  HCT 44.0   Cardiac Enzymes: No results for input(s): CKTOTAL, CKMB, CKMBINDEX, TROPONINI in the last 168 hours. BNP (last 3 results) No results for input(s): BNP in the last 8760 hours.  ProBNP  (last 3 results) No results for input(s): PROBNP in the last 8760 hours.  CBG:  Recent Labs Lab 12/16/14 1415 12/17/14 0646 12/18/14 2216 12/19/14 0648  GLUCAP 116* 71 152* 96    No results found for this or any previous visit (from the past 240 hour(s)).   Studies: No results found.  Scheduled Meds: . acarbose  25 mg Oral TID AC  . amLODipine  10 mg Oral Daily  . aspirin  81 mg Oral Daily  . atorvastatin  40 mg Oral Q supper  . diltiazem  120 mg Oral Daily  . heparin  5,000 Units Subcutaneous 3 times per day  . levothyroxine  88 mcg Oral QAC breakfast  . magnesium oxide  400 mg Oral Daily  . naproxen  500 mg Oral BID WC  . omega-3 acid ethyl esters  1 g Oral BID  . pantoprazole  80 mg Oral QAC supper  . predniSONE  40 mg Oral Q breakfast  . sertraline  50 mg Oral Daily   Continuous Infusions:  Antibiotics Given (last 72 hours)    None      Principal Problem:   Peripheral vertigo Active Problems:   Vertigo    Time spent: 30min    [place] [facility] Pager [phone]. If 7PM-7AM, please contact night-coverage at [email or web address], password [facility] 12/19/2014, 9:30 AM

## 2014-12-19 NOTE — Consult Note (Signed)
Physical Medicine and Rehabilitation Consult Reason for Consult: Peripheral vertigo/BPPV Referring Physician: [facility]   HPI: Jenna [name] is a 74 y.o. right handed female with history of intermittent atrial fibrillation not on any anticoagulants, chronic back pain and sees a chiropractor weekly. Independent prior to admission living alone. Presented 12/16/2014 with complaints of vertigo and fullness to the right ear. Cranial CT scan and MRI of the brain negative. Neurology consulted and placed on scheduled Antivert. Subcutaneous heparin for DVT prophylaxis. Cardiac enzymes negative. Patient unable to ambulate due to vertigo. Physical and occupational therapy evaluations completed 12/17/2014 with recommendations of physical medicine rehabilitation consult.   Review of Systems  Cardiovascular: Positive for palpitations.  Musculoskeletal: Positive for myalgias and back pain.  Neurological:       Restless leg syndrome  Psychiatric/Behavioral:       Anxiety  All other systems reviewed and are negative.  Past Medical History  Diagnosis Date  . RLS (restless legs syndrome)   . OSA (obstructive sleep apnea)   . Palpitations   . Fatigue   . Mild aortic stenosis   . Hypertension   . Hypothyroidism   . Cancer     melanoma   Past Surgical History  Procedure Laterality Date  . Lexiscan myoview  05/13/2012    No ECG changes. EKG negative for ischemia. No significant ischemia demonstrated.  . Transthoracic echocardiogram  02/18/2012    EF >55%, mild aortic stenosis  . Abdominal hysterectomy    . Cataract extraction     Family History  Problem Relation Age of Onset  . Hypertension Mother   . Diabetes Mother   . Heart failure Father   . Arrhythmia Sister   . Hyperlipidemia Sister   . Hypertension Sister   . Healthy Brother   . Stroke Paternal Grandmother   . Cancer Paternal Grandfather   . Healthy Daughter   . Hypertension Son   . Hyperlipidemia Son    Social History:   reports that she has never smoked. She has never used smokeless tobacco. She reports that she does not drink alcohol or use illicit drugs. Allergies:  Allergies  Allergen Reactions  . Crestor [Rosuvastatin]     Joints ached  . Lovastatin     Joints hurt all over body    Medications Prior to Admission  Medication Sig Dispense Refill  . acarbose (PRECOSE) 25 MG tablet Take 25 mg by mouth 3 (three) times daily.      [name] ALPRAZolam (XANAX) 0.25 MG tablet Take 1 tablet by mouth Twice daily.    [name] aspirin 81 MG tablet Take 81 mg by mouth daily.      [name] atorvastatin (LIPITOR) 40 MG tablet Take 1 tablet (40 mg total) by mouth daily. 30 tablet 6  . Calcium Carbonate-Vitamin D (CALCIUM + D PO) Take 600 mg by mouth 2 (two) times daily. Once a day    . CHERRY CONCENTRATE PO Take 4 oz by mouth 2 (two) times daily.    [name] diltiazem (CARDIZEM CD) 120 MG 24 hr capsule Take 1 capsule (120 mg total) by mouth daily. 30 capsule 6  . Ginger, Zingiber officinalis, (GINGER PO) Take 2 capsules by mouth daily.     [name] KLOR-CON M20 20 MEQ tablet Take 1 tablet by mouth daily as needed. For low potassium    . levothyroxine (SYNTHROID, LEVOTHROID) 88 MCG tablet Take 88 mcg by mouth daily before breakfast.    . Magnesium 250 MG TABS Take 1  tablet by mouth daily.    . Multiple Vitamins-Calcium (ONE-A-DAY WOMENS PO) Once a day     . Multiple Vitamins-Minerals (ICAPS AREDS FORMULA PO) Take 1 tablet by mouth 2 (two) times daily.    . Multiple Vitamins-Minerals (ZINC PO) Take 1 tablet by mouth every evening.    . naproxen (NAPROSYN) 500 MG tablet Take 500 mg by mouth 2 (two) times daily with a meal.    . Omega-3 Fatty Acids (FISH OIL) 1200 MG CAPS Take 1 at breakfast and 1 at supper    . omeprazole (PRILOSEC) 40 MG capsule Take 40 mg by mouth every evening.    [name] rOPINIRole (REQUIP) 0.5 MG tablet Take 2-3 daily at bedtime as needed (Patient taking differently: Take 0.5 mg by mouth daily as needed. Take 2-3 daily at bedtime as  needed) 90 tablet 3  . sertraline (ZOLOFT) 50 MG tablet Take 50 mg by mouth daily.    [name] torsemide (DEMADEX) 5 MG tablet Take 5-10 mg by mouth as needed.     . TURMERIC PO Take 2 tablets by mouth daily.       Home: Home Living Family/patient expects to be discharged to:: Private residence Living Arrangements: Alone Type of Home: House Home Access: Stairs to enter Technical brewer of Steps: 1+1 Entrance Stairs-Rails: Left Home Layout: One level Home Equipment: Grab bars - toilet, Grab bars - tub/shower, Shower seat, Cane - single point, Bedside commode  Functional History: Prior Function Level of Independence: Independent Functional Status:  Mobility: Bed Mobility Overal bed mobility: Needs Assistance Bed Mobility: Sidelying to Sit, Rolling Rolling: Modified independent (Device/Increase time) Sidelying to sit: Supervision Sit to sidelying: Mod assist General bed mobility comments: Pt used rail.  Maintained gaze fixation throughout movement with no increase in dizziness  Transfers Overall transfer level: Needs assistance Equipment used: Rolling walker (2 wheeled) Transfers: Sit to/from Stand Sit to Stand: Min assist Stand pivot transfers: Min assist General transfer comment: Verbal cues for hand placement and for gaze stabilization on target during stance.  Patient looked away from target on initial standing and lost balance posteriorly.  Repeated with use of target without loss of balance.  Able to perform from chair and toilet. Ambulation/Gait Ambulation/Gait assistance: Min assist Ambulation Distance (Feet): 75 Feet (60' in hallway and 15' from bathroom) Assistive device: Rolling walker (2 wheeled) Gait Pattern/deviations: Step-through pattern, Decreased stride length, Staggering left, Staggering right Gait velocity: Slow Gait velocity interpretation: Below normal speed for age/gender General Gait Details: Verbal cues for safe use of RW.  Cues to use gaze stabilization  during gait, especially during turns.  Patient continued to have loss of balance due to dizziness.     ADL: ADL Overall ADL's : Needs assistance/impaired Eating/Feeding: Independent Grooming: Wash/dry hands, Wash/dry face, Oral care, Brushing hair, Set up, Sitting Upper Body Bathing: Supervision/ safety, Set up, Sitting Lower Body Bathing: Minimal assistance, Sit to/from stand Upper Body Dressing : Supervision/safety, Set up, Sitting Lower Body Dressing: Minimal assistance, Sit to/from stand Toilet Transfer: Minimal assistance, Ambulation, Comfort height toilet, RW, BSC Toileting- Clothing Manipulation and Hygiene: Sit to/from stand, Moderate assistance Functional mobility during ADLs: Minimal assistance, Rolling walker General ADL Comments: While keeping gaze fixated on target, pt was able to don/doff socks with no increased dizziness, perform toilet transfer   Cognition: Cognition Overall Cognitive Status: Within Functional Limits for tasks assessed Orientation Level: Oriented X4 Cognition Arousal/Alertness: Awake/alert Behavior During Therapy: WFL for tasks assessed/performed Overall Cognitive Status: Within Functional Limits for tasks assessed  Blood pressure 175/57, pulse 55, temperature 97.9 F (36.6 C), temperature source Oral, resp. rate 20, height 5\' 6"  (1.676 m), weight 93.214 kg (205 lb 8 oz), SpO2 99 %. Physical Exam  Constitutional: She is oriented to person, place, and time. She appears well-developed.  HENT:  Head: Normocephalic.  Eyes: EOM are normal.  No nystagmus  Neck: Normal range of motion. Neck supple. No thyromegaly present.  Cardiovascular: Normal rate and regular rhythm.   Respiratory: Effort normal and breath sounds normal. No respiratory distress.  GI: Soft. Bowel sounds are normal. She exhibits no distension.  Neurological: She is alert and oriented to person, place, and time. She displays normal reflexes. She exhibits normal muscle tone. Coordination  normal.  Follows commands. No limb ataxia. Nystagmus with lateral gaze and rotational movements of head.  Skin: Skin is warm and dry.  Psychiatric: She has a normal mood and affect. Her behavior is normal.    Results for orders placed or performed during the hospital encounter of 12/16/14 (from the past 24 hour(s))  Glucose, capillary     Status: Abnormal   Collection Time: 12/18/14 10:16 PM  Result Value Ref Range   Glucose-Capillary 152 (H) 70 - 99 mg/dL   Comment 1 Notify RN    Comment 2 Documented in Char    No results found.  Assessment/Plan: Diagnosis: BPPV 1. Does the need for close, 24 hr/day medical supervision in concert with the patient's rehab needs make it unreasonable for this patient to be served in a less intensive setting? No 2. Co-Morbidities requiring supervision/potential complications:  n/a 3. Due to bladder management, bowel management and skin/wound care, does the patient require 24 hr/day rehab nursing? No 4. Does the patient require coordinated care of a physician, rehab nurse, PT, OT to address physical and functional deficits in the context of the above medical diagnosis(es)? No Addressing deficits in the following areas: balance, endurance, locomotion, strength, transferring, bowel/bladder control and dressing 5. Can the patient actively participate in an intensive therapy program of at least 3 hrs of therapy per day at least 5 days per week? No 6. The potential for patient to make measurable gains while on inpatient rehab is fair 7. Anticipated functional outcomes upon discharge from inpatient rehab are n/a  with PT, n/a with OT, n/a with SLP. 8. Estimated rehab length of stay to reach the above functional goals is: n./a 9. Does the patient have adequate social supports and living environment to accommodate these discharge functional goals? No 10. Anticipated D/C setting: Home 11. Anticipated post D/C treatments: [place] therapy 12. Overall Rehab/Functional  Prognosis: excellent  RECOMMENDATIONS: This patient's condition is appropriate for continued rehabilitative care in the following setting: SNF Patient has agreed to participate in recommended program. Yes and Potentially Note that insurance prior authorization may be required for reimbursement for recommended care.  Comment: Rehab Admissions Coordinator to follow up. No medical necessity for CIR. On observation status  Thanks,  Meredith Staggers, MD, Mellody Drown     12/19/2014

## 2014-12-19 NOTE — Progress Notes (Signed)
Occupational Therapy Treatment Patient Details Name: Jenna [name] MRN: [id number] DOB: Mar 17, 1941 Today's Date: 12/19/2014    History of present illness Adm with several day history of dizziness which included vertigo and pre-syncope. Pt reports dizziness occurs with all movement and lasts for minutes once she stops moving (persists continuously when she is moving).    OT comments  Patient progressing towards goals, continue plan of care for now. D/C plan changed from CIR>SNF. Continue to recommend occupational therapy to help patient regain her independence in the areas of ADLs and functional mobility.   Follow Up Recommendations  Supervision/Assistance - 24 hour;SNF    Equipment Recommendations   (TBD)    Recommendations for Other Services  None at this time    Precautions / Restrictions Precautions Precautions: Fall Restrictions Weight Bearing Restrictions: No       Mobility Bed Mobility General bed mobility comments: Patient seated in recliner upon OT entering room   Transfers General transfer comment: Did not focus on transfer during OT session.         ADL General ADL Comments: Educated patient on importance of fixating on target during ADLs and functional mobility/tasks and completing exercise in order to decrease dizziness/vertigo. Wrote out exercises in bigger print secondary to patient with macular degeneration per her report.      Cognition   Behavior During Therapy: WFL for tasks assessed/performed Overall Cognitive Status: Within Functional Limits for tasks assessed                 Pertinent Vitals/ Pain       Pain Assessment: No/denies pain         Frequency Min 2X/week     Progress Toward Goals  OT Goals(current goals can now be found in the care plan section)  Progress towards OT goals: Progressing toward goals     Plan Discharge plan needs to be updated    Activity Tolerance Patient tolerated treatment well   Patient Left in  chair;with call bell/phone within reach    Time: 1518-1540 OT Time Calculation (min): 22 min  Charges: OT General Charges $OT Visit: 1 Procedure OT Treatments $Therapeutic Exercise: 8-22 mins  Jenna [name] , MS, OTR/L, CLT Pager: [phone]  12/19/2014, 3:53 PM

## 2014-12-19 NOTE — Progress Notes (Signed)
Rehab admissions - I met with pt in follow up to rehab MD consult and shared that pt does not have the medical necessity for inpatient rehab based on her observation status. Pt was understandable of this but was hoping she could come to CIR. Pt now states the plan is for SNF. She had no further questions for me.  Dysheka, Education officer, museum, was in pt's room upon my entry and was going over different SNF options.  I will now sign off pt's case and we recommend that SNF be pursued per pt's preference.  Thanks.  Nanetta Batty, PT Rehabilitation Admissions Coordinator [phone]

## 2014-12-19 NOTE — Progress Notes (Addendum)
Rehab Admissions Coordinator Note:  Patient was screened by Cleatrice Burke for appropriateness for an Inpatient Acute Rehab Consult per PT and OT recommendation  At this time, we await rehab consult completion today. Sherlyn Hay [place] will follow up on this pt. [phone]. This pt is under observation status.  Cleatrice Burke 12/19/2014, 8:37 AM  I can be reached at [phone].

## 2014-12-19 NOTE — Clinical Social Work Placement (Addendum)
Clinical Social Work Department CLINICAL SOCIAL WORK PLACEMENT NOTE 12/19/2014  Patient:  Jenna [name], Jenna [name]  Account Number:  [id number] Admit date:  12/16/2014  Clinical Social Worker:  Paulette Blanch Cinnamon Morency, LCSWA  Date/time:  12/19/2014 12:37 PM  Clinical Social Work is seeking post-discharge placement for this patient at the following level of care:   SKILLED NURSING   (*CSW will update this form in Epic as items are completed)   12/19/2014  Patient/family provided with [place] Department of Clinical Social Work's list of facilities offering this level of care within the geographic area requested by the patient (or if unable, by the patient's family).  12/19/2014  Patient/family informed of their freedom to choose among providers that offer the needed level of care, that participate in Medicare, Medicaid or managed care program needed by the patient, have an available bed and are willing to accept the patient.  12/19/2014  Patient/family informed of [facility]' ownership interest in [facility], as well as of the fact that they are under no obligation to receive care at this facility.  PASARR submitted to EDS on 12/19/2014 PASARR number received on 12/19/2014  FL2 transmitted to all facilities in geographic area requested by pt/family on  12/19/2014 FL2 transmitted to all facilities within larger geographic area on 12/19/2014  Patient informed that his/her managed care company has contracts with or will negotiate with  certain facilities, including the following:     Patient/family informed of bed offers received: 12/19/2014  Patient chooses bed at [facility]  Physician recommends and patient chooses bed at    Patient to be transferred to [facility] on 12/20/2013  Patient to be transferred to facility by Ewell Poe, daughter   Patient and family notified of transfer on 12/21/2014 Name of family member notified:  Pt reported that she will notify her daughter   The following  physician request were entered in Epic:   Additional Comments:   [place], MSW, [place]

## 2014-12-19 NOTE — Clinical Social Work Psychosocial (Signed)
Clinical Social Work Department BRIEF PSYCHOSOCIAL ASSESSMENT 12/19/2014  Patient:  Jenna [name], Jenna [name]     Account Number:  [id number]     Admit date:  12/16/2014  Clinical Social Worker:  Marciano Sequin  Date/Time:  12/19/2014 12:33 PM  Referred by:  RN  Date Referred:  12/19/2014 Referred for  SNF Placement   Other Referral:   Interview type:  Patient Other interview type:    PSYCHOSOCIAL DATA Living Status:  ALONE Admitted from facility:   Level of care:   Primary support name:  Allen,Tonya & Teigen,John R III Primary support relationship to patient:  CHILD, ADULT Degree of support available:   Strong Support    CURRENT CONCERNS Current Concerns  None Noted   Other Concerns:    SOCIAL WORK ASSESSMENT / PLAN CSW met the pt at the bedside.  CSW introduced self and purpose of the visit. CSW discussed clinical recommendation for SNF rehab. CSW inquired about the geographical location in which the pt would like to receive rehab from. CSW explained the SNF rehab process to the pt. CSW and pt discussed insurance and its relation to SNF rehab. CSW answered all questions in which the pt inquired about. CSW provided pt with contact information for further questions. CSW will continue to follow this pt and assist with discharge as needed.   Assessment/plan status:  Psychosocial Support/Ongoing Assessment of Needs Other assessment/ plan:   Information/referral to community resources:     PATIENT'S/FAMILY'S RESPONSE TO CURRENT DIAGNOSE: Pt presented with a normal affect and calm mood. Pt reported dealing with her current diagnoses for years. Pt reported that she is able to manage until she gets a flare up.    PATIENT'S/FAMILY'S RESPONSE TO PLAN OF CARE: Pt is agreeable to receive rehab at a SNF. Pt reported understanding why she was not accept in CIR.     [place], MSW, [place]

## 2014-12-20 LAB — GLUCOSE, CAPILLARY
Glucose-Capillary: 93 mg/dL (ref 70–99)
Glucose-Capillary: 151 mg/dL — ABNORMAL HIGH (ref 70–99)

## 2014-12-20 MED ORDER — PREDNISONE 5 MG PO TABS: 30.0000 mg | ORAL_TABLET | Freq: Every day | ORAL | Status: DC

## 2014-12-20 MED ORDER — PREDNISONE 10 MG PO TABS: 10.0000 mg | ORAL_TABLET | Freq: Every day | ORAL | Status: DC

## 2014-12-20 MED ORDER — AMLODIPINE BESYLATE 10 MG PO TABS: 10.0000 mg | ORAL_TABLET | Freq: Every day | ORAL | Status: DC

## 2014-12-20 MED ORDER — MECLIZINE HCL 25 MG PO TABS: 25.0000 mg | ORAL_TABLET | Freq: Three times a day (TID) | ORAL | Status: DC | PRN

## 2014-12-20 NOTE — Progress Notes (Signed)
Pt is being discharged to [place] facility. Discharge package was given to patient's daughter. Report was given to [facility]

## 2014-12-20 NOTE — Progress Notes (Signed)
Medically stable for DC, will complete DC summary shortly DC to SNF today  Domenic Polite, MD [phone]

## 2014-12-20 NOTE — Discharge Summary (Signed)
Physician Discharge Summary  Jenna [name] XIP:[id number] DOB: 05-Apr-1941 DOA: 12/16/2014  PCP: Chesley Noon, MD  Admit date: 12/16/2014 Discharge date: 12/20/2014  Time spent: 45 minutes  Recommendations for Outpatient Follow-up:  1. PCP Dr.Badger in 1 week 2. Wean Off prednisone in 2-3days 3. Continue Vestibular Rehab  Discharge Diagnoses:  Principal Problem:   Peripheral vertigo Active Problems:   RESTLESS LEGS SYNDROME   HTN (hypertension)   PAF (paroxysmal atrial fibrillation)   Vertigo   Discharge Condition: stable  Diet recommendation: low sodium  Filed Weights   12/16/14 1114 12/16/14 2249  Weight: 91.627 kg (202 lb) 93.214 kg (205 lb 8 oz)    History of present illness:  Jenna [name] is a 74 y.o. female h/o intermittent A.Fib, not on anticoagulants due to short duration and infrequency, presented to the ER with vertigo, worse with movement, better at rest  Hospital Course:  1. Severe Peripheral Vertigo: labrynthitis vs vestibular neuritis -was very dizzy with minimal movement for 2-3days, slowly starting to improve -Seen by Neurology and felt to have peripheral Vertigo, MRI negative -Treated with Prednisone given some data in patients with labrynthitis, starting to improve, wean off prednisone over next 2-3days - meclizine PRN  -Continue Vestibular Rehab -being discharged to [place]  2. RLS -continue requip  3. H/o P.Afib -very low burden, on ASA per Cards  4. HTN -add amlodipine  Consultations:  Neuro  Discharge Exam: Filed Vitals:   12/20/14 0700  BP: 157/66  Pulse: 64  Temp:   Resp: 18    General: AAOx3 Cardiovascular: S1S2/RRR Respiratory: CTAB  Discharge Instructions   Discharge Instructions    Diet - low sodium heart healthy    Complete by:  As directed      Increase activity slowly    Complete by:  As directed           Current Discharge Medication List    START taking these medications   Details  amLODipine  (NORVASC) 10 MG tablet Take 1 tablet (10 mg total) by mouth daily. Qty: 30 tablet, Refills: 0    meclizine (ANTIVERT) 25 MG tablet Take 1 tablet (25 mg total) by mouth 3 (three) times daily as needed for dizziness. Qty: 30 tablet, Refills: 0    predniSONE (DELTASONE) 10 MG tablet Take 1-3 tablets (10-30 mg total) by mouth daily with breakfast. Take 30mg  for 2days then 20mg  for 2days then STOP      CONTINUE these medications which have NOT CHANGED   Details  acarbose (PRECOSE) 25 MG tablet Take 25 mg by mouth 3 (three) times daily.      ALPRAZolam (XANAX) 0.25 MG tablet Take 1 tablet by mouth Twice daily.    aspirin 81 MG tablet Take 81 mg by mouth daily.      atorvastatin (LIPITOR) 40 MG tablet Take 1 tablet (40 mg total) by mouth daily. Qty: 30 tablet, Refills: 6    Calcium Carbonate-Vitamin D (CALCIUM + D PO) Take 600 mg by mouth 2 (two) times daily. Once a day    CHERRY CONCENTRATE PO Take 4 oz by mouth 2 (two) times daily.    diltiazem (CARDIZEM CD) 120 MG 24 hr capsule Take 1 capsule (120 mg total) by mouth daily. Qty: 30 capsule, Refills: 6    Ginger, Zingiber officinalis, (GINGER PO) Take 2 capsules by mouth daily.     KLOR-CON M20 20 MEQ tablet Take 1 tablet by mouth daily as needed. For low potassium    levothyroxine (  SYNTHROID, LEVOTHROID) 88 MCG tablet Take 88 mcg by mouth daily before breakfast.    Magnesium 250 MG TABS Take 1 tablet by mouth daily.    Multiple Vitamins-Calcium (ONE-A-DAY WOMENS PO) Once a day     Multiple Vitamins-Minerals (ICAPS AREDS FORMULA PO) Take 1 tablet by mouth 2 (two) times daily.    Multiple Vitamins-Minerals (ZINC PO) Take 1 tablet by mouth every evening.    naproxen (NAPROSYN) 500 MG tablet Take 500 mg by mouth 2 (two) times daily with a meal.    Omega-3 Fatty Acids (FISH OIL) 1200 MG CAPS Take 1 at breakfast and 1 at supper    omeprazole (PRILOSEC) 40 MG capsule Take 40 mg by mouth every evening.    rOPINIRole (REQUIP) 0.5 MG  tablet Take 2-3 daily at bedtime as needed Qty: 90 tablet, Refills: 3    sertraline (ZOLOFT) 50 MG tablet Take 50 mg by mouth daily.    torsemide (DEMADEX) 5 MG tablet Take 5-10 mg by mouth as needed.     TURMERIC PO Take 2 tablets by mouth daily.        Allergies  Allergen Reactions  . Crestor [Rosuvastatin]     Joints ached  . Lovastatin     Joints hurt all over body    Follow-up Information    Follow up with BADGER,MICHAEL C, MD. Schedule an appointment as soon as possible for a visit in 1 week.   Specialty:  Family Medicine   Contact information:   [place] [address] [phone]        The results of significant diagnostics from this hospitalization (including imaging, microbiology, ancillary and laboratory) are listed below for reference.    Significant Diagnostic Studies: Ct Head Wo Contrast  12/16/2014   CLINICAL DATA:  Intermittent dizziness  EXAM: CT HEAD WITHOUT CONTRAST  TECHNIQUE: Contiguous axial images were obtained from the base of the skull through the vertex without intravenous contrast.  COMPARISON:  01/30/2007  FINDINGS: The bony calvarium is intact. The ventricles are of normal size and configuration. No findings to suggest acute hemorrhage, acute infarction or space-occupying mass lesion are noted.  IMPRESSION: No acute intracranial abnormality noted.   Electronically Signed   By: Inez Catalina M.D.   On: 12/16/2014 15:29   Mr Brain Wo Contrast  12/16/2014   CLINICAL DATA:  Vertigo. Intermittent dizziness near syncope for the past 3 days. Tachycardia. Fullness in the right ear with possible decreased hearing.  EXAM: MRI HEAD WITHOUT CONTRAST  TECHNIQUE: Multiplanar, multiecho pulse sequences of the brain and surrounding structures were obtained without intravenous contrast.  COMPARISON:  CT head without contrast from the same day. MRI brain 01/30/2007.  FINDINGS: Mild atrophy and white matter changes are within normal limits for age. No  acute infarct, hemorrhage, or mass lesion is present. The ventricles are proportionate to the degree of atrophy. No significant extraaxial fluid collection is present.  Flow is present in the major intracranial arteries. The patient is status post bilateral lens replacements. The globes and orbits are otherwise intact. The paranasal sinuses and mastoid air cells are clear.  Skullbase is unremarkable. Midline structures are within normal limits.  IMPRESSION: Normal MRI of the brain for age.   Electronically Signed   By: San Morelle M.D.   On: 12/16/2014 19:32    Microbiology: No results found for this or any previous visit (from the past 240 hour(s)).   Labs: Basic Metabolic Panel:  Recent Labs Lab 12/16/14 1253  12/16/14 1305  NA 135 135  K 5.0 4.8  CL 101 100  CO2 24  --   GLUCOSE 114* 118*  BUN 12 18  CREATININE 0.71 0.70  CALCIUM 8.8  --    Liver Function Tests:  Recent Labs Lab 12/16/14 1253  AST 32  ALT 24  ALKPHOS 61  BILITOT 1.3*  PROT 6.3  ALBUMIN 3.6   No results for input(s): LIPASE, AMYLASE in the last 168 hours. No results for input(s): AMMONIA in the last 168 hours. CBC:  Recent Labs Lab 12/16/14 1305  HGB 15.0  HCT 44.0   Cardiac Enzymes: No results for input(s): CKTOTAL, CKMB, CKMBINDEX, TROPONINI in the last 168 hours. BNP: BNP (last 3 results) No results for input(s): BNP in the last 8760 hours.  ProBNP (last 3 results) No results for input(s): PROBNP in the last 8760 hours.  CBG:  Recent Labs Lab 12/19/14 0648 12/19/14 1158 12/19/14 1705 12/19/14 2237 12/20/14 0724  GLUCAP 96 154* 185* 135* 93       Signed:  Aarin Bluett  [facility] 12/20/2014, 8:31 AM

## 2014-12-22 ENCOUNTER — Non-Acute Institutional Stay (SKILLED_NURSING_FACILITY): Payer: Medicare Other | Admitting: Internal Medicine

## 2014-12-22 DIAGNOSIS — I10 Essential (primary) hypertension: Secondary | ICD-10-CM

## 2014-12-22 DIAGNOSIS — I480 Paroxysmal atrial fibrillation: Secondary | ICD-10-CM | POA: Diagnosis not present

## 2014-12-22 DIAGNOSIS — G2581 Restless legs syndrome: Secondary | ICD-10-CM | POA: Diagnosis not present

## 2014-12-22 DIAGNOSIS — H81393 Other peripheral vertigo, bilateral: Secondary | ICD-10-CM

## 2014-12-22 NOTE — Progress Notes (Signed)
MRN: [id number] Name: Jenna [name]  Sex: female Age: 74 y.o. DOB: 12-12-1940  [place] #: Helene Kelp Facility/Room:210 Level Of Care: SNF Provider: Inocencio Homes D Emergency Contacts: Extended Emergency Contact Information Primary Emergency Contact: Milagros Loll [place] Work Phone: [phone] Mobile Phone: [phone] Relation: Daughter Secondary Emergency Contact: Delma Officer [place] Phone: [phone] Relation: Son   Allergies: Crestor and Lovastatin  Chief Complaint  Patient presents with  . New Admit To SNF    HPI: Patient is 74 y.o. female who was hospitaized for persistant vertigo and admitted to SNF for vestibular rehab.  Past Medical History  Diagnosis Date  . RLS (restless legs syndrome)   . OSA (obstructive sleep apnea)   . Palpitations   . Fatigue   . Mild aortic stenosis   . Hypertension   . Hypothyroidism   . Cancer     melanoma    Past Surgical History  Procedure Laterality Date  . Lexiscan myoview  05/13/2012    No ECG changes. EKG negative for ischemia. No significant ischemia demonstrated.  . Transthoracic echocardiogram  02/18/2012    EF >55%, mild aortic stenosis  . Abdominal hysterectomy    . Cataract extraction        Medication List       This list is accurate as of: 12/22/14 11:59 PM.  Always use your most recent med list.               acarbose 25 MG tablet  Commonly known as:  PRECOSE  Take 25 mg by mouth 3 (three) times daily.     ALPRAZolam 0.25 MG tablet  Commonly known as:  XANAX  Take 1 tablet by mouth Twice daily.     amLODipine 10 MG tablet  Commonly known as:  NORVASC  Take 1 tablet (10 mg total) by mouth daily.     aspirin 81 MG tablet  Take 81 mg by mouth daily.     atorvastatin 40 MG tablet  Commonly known as:  LIPITOR  Take 1 tablet (40 mg total) by mouth daily.     CALCIUM + D PO  Take 600 mg by mouth 2 (two) times daily. Once a day     CHERRY CONCENTRATE  PO  Take 4 oz by mouth 2 (two) times daily.     diltiazem 120 MG 24 hr capsule  Commonly known as:  CARDIZEM CD  Take 1 capsule (120 mg total) by mouth daily.     Fish Oil 1200 MG Caps  Take 1 at breakfast and 1 at supper     GINGER PO  Take 2 capsules by mouth daily.     ICAPS AREDS FORMULA PO  Take 1 tablet by mouth 2 (two) times daily.     KLOR-CON M20 20 MEQ tablet  Generic drug:  potassium chloride SA  Take 1 tablet by mouth daily as needed. For low potassium     levothyroxine 88 MCG tablet  Commonly known as:  SYNTHROID, LEVOTHROID  Take 88 mcg by mouth daily before breakfast.     Magnesium 250 MG Tabs  Take 1 tablet by mouth daily.     meclizine 25 MG tablet  Commonly known as:  ANTIVERT  Take 1 tablet (25 mg total) by mouth 3 (three) times daily as needed for dizziness.     naproxen 500 MG tablet  Commonly known as:  NAPROSYN  Take 500 mg by mouth 2 (two) times daily  with a meal.     omeprazole 40 MG capsule  Commonly known as:  PRILOSEC  Take 40 mg by mouth every evening.     ONE-A-DAY WOMENS PO  Once a day     predniSONE 10 MG tablet  Commonly known as:  DELTASONE  Take 1-3 tablets (10-30 mg total) by mouth daily with breakfast. Take 30mg  for 2days then 20mg  for 2days then STOP     rOPINIRole 0.5 MG tablet  Commonly known as:  REQUIP  Take 2-3 daily at bedtime as needed     sertraline 50 MG tablet  Commonly known as:  ZOLOFT  Take 50 mg by mouth daily.     torsemide 5 MG tablet  Commonly known as:  DEMADEX  Take 5-10 mg by mouth as needed.     TURMERIC PO  Take 2 tablets by mouth daily.     ZINC PO  Take 1 tablet by mouth every evening.        No orders of the defined types were placed in this encounter.    Immunization History  Administered Date(s) Administered  . H1N1 09/12/2008  . Influenza Split 07/22/2011, 07/16/2012  . Influenza,inj,Quad PF,36+ Mos 07/13/2013, 07/18/2014    History  Substance Use Topics  . Smoking status:  Never Smoker   . Smokeless tobacco: Never Used  . Alcohol Use: No    Family history is noncontributory    Review of Systems  DATA OBTAINED: from patient GENERAL:  no fevers, fatigue, appetite changes SKIN: No itching, rash or wounds EYES: No eye pain, redness, discharge EARS: R ear fullness for a week,no  tinnitus, change in hearing NOSE: No congestion, drainage or bleeding  MOUTH/THROAT: No mouth or tooth pain, No sore throat RESPIRATORY: No cough, wheezing, SOB CARDIAC: No chest pain, palpitations, lower extremity edema  GI: No abdominal pain, No N/V/D or constipation, No heartburn or reflux  GU: No dysuria, frequency or urgency, or incontinence  MUSCULOSKELETAL: No unrelieved bone/joint pain NEUROLOGIC: No headache, dizziness or focal weakness PSYCHIATRIC: No overt anxiety or sadness, No behavior issue.   Filed Vitals:   12/25/14 1806  BP: 159/89  Pulse: 83  Temp: 98 F (36.7 C)  Resp: 18    Physical Exam  GENERAL APPEARANCE: Alert, conversant,  No acute distress.  SKIN: No diaphoresis rash HEAD: Normocephalic, atraumatic  EYES: Conjunctiva/lids clear. Pupils round, reactive. EOMs intact.  EARS: External exam WNL, canals clear. Hearing grossly normal.  NOSE: No deformity or discharge.  MOUTH/THROAT: Lips w/o lesions  RESPIRATORY: Breathing is even, unlabored. Lung sounds are clear   CARDIOVASCULAR: Heart RRR no murmurs, rubs or gallops. No peripheral edema.   GASTROINTESTINAL: Abdomen is soft, non-tender, not distended w/ normal bowel sounds. GENITOURINARY: Bladder non tender, not distended  MUSCULOSKELETAL: No abnormal joints or musculature NEUROLOGIC:  Cranial nerves 2-12 grossly intact.Full EOM while speaking without apparent dizziness PSYCHIATRIC: Mood and affect appropriate to situation, no behavioral issues  Patient Active Problem List   Diagnosis Date Noted  . Peripheral vertigo 12/16/2014  . Vertigo 12/16/2014  . Aortic stenosis, mild 07/26/2013  .  HTN (hypertension) 07/26/2013  . Hypothyroidism 07/26/2013  . PAF (paroxysmal atrial fibrillation) 07/26/2013  . Obstructive sleep apnea 06/20/2008  . RESTLESS LEGS SYNDROME 06/20/2008    CBC    Component Value Date/Time   HGB 15.0 12/16/2014 1305   HCT 44.0 12/16/2014 1305    CMP     Component Value Date/Time   NA 135 12/16/2014 1305   K  4.8 12/16/2014 1305   CL 100 12/16/2014 1305   CO2 24 12/16/2014 1253   GLUCOSE 118* 12/16/2014 1305   BUN 18 12/16/2014 1305   CREATININE 0.70 12/16/2014 1305   CALCIUM 8.8 12/16/2014 1253   PROT 6.3 12/16/2014 1253   ALBUMIN 3.6 12/16/2014 1253   AST 32 12/16/2014 1253   ALT 24 12/16/2014 1253   ALKPHOS 61 12/16/2014 1253   BILITOT 1.3* 12/16/2014 1253   GFRNONAA 84* 12/16/2014 1253   GFRAA >90 12/16/2014 1253    Assessment and Plan  Peripheral vertigo 1. : labrynthitis vs vestibular neuritis -was very dizzy with minimal movement for 2-3days, slowly starting to improve -Seen by Neurology and felt to have peripheral Vertigo, MRI negative -Treated with Prednisone given some data in patients with labrynthitis, starting to improve, wean off prednisone over next 2-3days - meclizine PRN  -Continue Vestibular Rehab -being discharged to [facility]   PAF (paroxysmal atrial fibrillation) On diltiazem for rate; ASA for pprphylaxis   HTN (hypertension) Continue cardizem, demadex, norvasc     Hennie Duos, MD

## 2014-12-25 ENCOUNTER — Encounter: Payer: Self-pay | Admitting: Internal Medicine

## 2014-12-25 NOTE — Assessment & Plan Note (Signed)
Continue requip

## 2014-12-25 NOTE — Assessment & Plan Note (Signed)
On diltiazem for rate; ASA for pprphylaxis

## 2014-12-25 NOTE — Assessment & Plan Note (Addendum)
1. : labrynthitis vs vestibular neuritis -was very dizzy with minimal movement for 2-3days, slowly starting to improve -Seen by Neurology and felt to have peripheral Vertigo, MRI negative -Treated with Prednisone given some data in patients with labrynthitis, starting to improve, wean off prednisone over next 2-3days - meclizine PRN  -Continue Vestibular Rehab -being discharged to [facility] I suggested pt start using antivert regularly

## 2014-12-25 NOTE — Assessment & Plan Note (Signed)
Continue cardizem, demadex, norvasc

## 2015-01-04 ENCOUNTER — Other Ambulatory Visit: Payer: Self-pay

## 2015-01-04 MED ORDER — ALPRAZOLAM 0.25 MG PO TABS: 0.2500 mg | ORAL_TABLET | Freq: Two times a day (BID) | ORAL | Status: DC | PRN

## 2015-01-04 NOTE — Telephone Encounter (Signed)
Faxed to [facility] Fax Number: [phone], Phone Number [phone]  

## 2015-01-05 ENCOUNTER — Encounter: Payer: Self-pay | Admitting: Internal Medicine

## 2015-01-05 ENCOUNTER — Non-Acute Institutional Stay (SKILLED_NURSING_FACILITY): Payer: Medicare Other | Admitting: Internal Medicine

## 2015-01-05 DIAGNOSIS — G2581 Restless legs syndrome: Secondary | ICD-10-CM

## 2015-01-05 DIAGNOSIS — H81393 Other peripheral vertigo, bilateral: Secondary | ICD-10-CM

## 2015-01-05 DIAGNOSIS — I10 Essential (primary) hypertension: Secondary | ICD-10-CM

## 2015-01-05 DIAGNOSIS — I480 Paroxysmal atrial fibrillation: Secondary | ICD-10-CM

## 2015-01-05 NOTE — Progress Notes (Signed)
MRN: [id number] Name: Jenna [name]  Sex: female Age: 74 y.o. DOB: Mar 29, 1941  [facility] Facility/Room:210 Level Of Care: SNF Provider: Inocencio Homes D Emergency Contacts: Extended Emergency Contact Information Primary Emergency Contact: Milagros Loll [place] Work Phone: [phone] Mobile Phone: [phone] Relation: Daughter Secondary Emergency Contact: Margy Clarks III  [place] Phone: [phone] Relation: Son  Code Status: FULL  Allergies: Crestor and Lovastatin  Chief Complaint  Patient presents with  . Discharge Note    HPI: Patient is 74 y.o. female who was admitted for vestibular rehab who is now ready to be discharged to home.  Past Medical History  Diagnosis Date  . RLS (restless legs syndrome)   . OSA (obstructive sleep apnea)   . Palpitations   . Fatigue   . Mild aortic stenosis   . Hypertension   . Hypothyroidism   . Cancer     melanoma    Past Surgical History  Procedure Laterality Date  . Lexiscan myoview  05/13/2012    No ECG changes. EKG negative for ischemia. No significant ischemia demonstrated.  . Transthoracic echocardiogram  02/18/2012    EF >55%, mild aortic stenosis  . Abdominal hysterectomy    . Cataract extraction        Medication List       This list is accurate as of: 01/05/15  2:08 PM.  Always use your most recent med list.               acarbose 25 MG tablet  Commonly known as:  PRECOSE  Take 25 mg by mouth 3 (three) times daily.     ALPRAZolam 0.25 MG tablet  Commonly known as:  XANAX  Take 1 tablet (0.25 mg total) by mouth 2 (two) times daily as needed for anxiety.     amLODipine 10 MG tablet  Commonly known as:  NORVASC  Take 1 tablet (10 mg total) by mouth daily.     aspirin 81 MG tablet  Take 81 mg by mouth daily.     atorvastatin 40 MG tablet  Commonly known as:  LIPITOR  Take 1 tablet (40 mg total) by mouth daily.     CALCIUM + D PO  Take 600 mg by mouth  2 (two) times daily. Once a day     diltiazem 120 MG 24 hr capsule  Commonly known as:  CARDIZEM CD  Take 1 capsule (120 mg total) by mouth daily.     Fish Oil 1200 MG Caps  Take 1 at breakfast and 1 at supper     GINGER PO  Take 2 capsules by mouth daily.     ICAPS AREDS FORMULA PO  Take 1 tablet by mouth 2 (two) times daily.     levothyroxine 88 MCG tablet  Commonly known as:  SYNTHROID, LEVOTHROID  Take 88 mcg by mouth daily before breakfast.     Magnesium 250 MG Tabs  Take 1 tablet by mouth daily.     meclizine 25 MG tablet  Commonly known as:  ANTIVERT  Take 1 tablet (25 mg total) by mouth 3 (three) times daily as needed for dizziness.     naproxen 500 MG tablet  Commonly known as:  NAPROSYN  Take 500 mg by mouth 2 (two) times daily with a meal.     omeprazole 40 MG capsule  Commonly known as:  PRILOSEC  Take 40 mg by mouth every evening.     ONE-A-DAY WOMENS PO  Once a day     rOPINIRole 0.5 MG tablet  Commonly known as:  REQUIP  Take 2-3 daily at bedtime as needed     sertraline 50 MG tablet  Commonly known as:  ZOLOFT  Take 50 mg by mouth daily.     TURMERIC PO  Take 2 tablets by mouth daily.     ZINC PO  Take 1 tablet by mouth every evening.        No orders of the defined types were placed in this encounter.    Immunization History  Administered Date(s) Administered  . H1N1 09/12/2008  . Influenza Split 07/22/2011, 07/16/2012  . Influenza,inj,Quad PF,36+ Mos 07/13/2013, 07/18/2014    History  Substance Use Topics  . Smoking status: Never Smoker   . Smokeless tobacco: Never Used  . Alcohol Use: No    Filed Vitals:   01/05/15 1326  BP: 110/72  Pulse: 72  Temp: 97.3 F (36.3 C)  Resp: 20    Physical Exam  GENERAL APPEARANCE: Alert, conversant. No acute distress.  HEENT: Unremarkable. RESPIRATORY: Breathing is even, unlabored. Lung sounds are clear   CARDIOVASCULAR: Heart RRR no murmurs, rubs or gallops. No peripheral edema.   GASTROINTESTINAL: Abdomen is soft, non-tender, not distended w/ normal bowel sounds.  NEUROLOGIC: Cranial nerves 2-12 grossly intact. Moves all extremities  Patient Active Problem List   Diagnosis Date Noted  . Peripheral vertigo 12/16/2014  . Vertigo 12/16/2014  . Aortic stenosis, mild 07/26/2013  . HTN (hypertension) 07/26/2013  . Hypothyroidism 07/26/2013  . PAF (paroxysmal atrial fibrillation) 07/26/2013  . Obstructive sleep apnea 06/20/2008  . RESTLESS LEGS SYNDROME 06/20/2008    CBC    Component Value Date/Time   HGB 15.0 12/16/2014 1305   HCT 44.0 12/16/2014 1305    CMP     Component Value Date/Time   NA 135 12/16/2014 1305   K 4.8 12/16/2014 1305   CL 100 12/16/2014 1305   CO2 24 12/16/2014 1253   GLUCOSE 118* 12/16/2014 1305   BUN 18 12/16/2014 1305   CREATININE 0.70 12/16/2014 1305   CALCIUM 8.8 12/16/2014 1253   PROT 6.3 12/16/2014 1253   ALBUMIN 3.6 12/16/2014 1253   AST 32 12/16/2014 1253   ALT 24 12/16/2014 1253   ALKPHOS 61 12/16/2014 1253   BILITOT 1.3* 12/16/2014 1253   GFRNONAA 84* 12/16/2014 1253   GFRAA >90 12/16/2014 1253    Assessment and Plan  Pt is stable for d/c to home with HH/OT/PT.  Hennie Duos, MD

## 2015-02-10 ENCOUNTER — Other Ambulatory Visit: Payer: Self-pay

## 2015-02-10 DIAGNOSIS — Z1231 Encounter for screening mammogram for malignant neoplasm of breast: Secondary | ICD-10-CM

## 2015-03-13 ENCOUNTER — Other Ambulatory Visit: Payer: Self-pay | Admitting: Internal Medicine

## 2015-03-13 NOTE — Telephone Encounter (Signed)
Rx has been sent to the pharmacy electronically. ° °

## 2015-03-16 ENCOUNTER — Other Ambulatory Visit: Payer: Self-pay | Admitting: Internal Medicine

## 2015-03-16 ENCOUNTER — Ambulatory Visit
Admission: RE | Admit: 2015-03-16 | Discharge: 2015-03-16 | Disposition: A | Discharge status: Home / Self Care | Payer: Medicare Other | Source: Ambulatory Visit

## 2015-03-16 DIAGNOSIS — Z1231 Encounter for screening mammogram for malignant neoplasm of breast: Secondary | ICD-10-CM

## 2015-04-11 ENCOUNTER — Telehealth: Payer: Self-pay | Admitting: Internal Medicine

## 2015-04-11 NOTE — Telephone Encounter (Signed)
Fax pending from PCP. Patient seen today in office by Mattie Marlin PA for dizziness, she did EKG as part of eval. Did not have comparison for historical data and noted abnormal EKG.

## 2015-04-17 NOTE — Telephone Encounter (Signed)
Dr. Debara Pickett reviewed EKG - no acute concerns - ? Leads V1 & V2 reversed?

## 2015-04-18 ENCOUNTER — Encounter: Payer: Self-pay | Admitting: Internal Medicine

## 2015-07-04 ENCOUNTER — Ambulatory Visit (INDEPENDENT_AMBULATORY_CARE_PROVIDER_SITE_OTHER): Payer: Medicare Other | Admitting: Internal Medicine

## 2015-07-04 ENCOUNTER — Encounter: Payer: Self-pay | Admitting: Internal Medicine

## 2015-07-04 VITALS — BP 142/74 | HR 64 | Ht 65.0 in | Wt 208.3 lb

## 2015-07-04 DIAGNOSIS — I350 Nonrheumatic aortic (valve) stenosis: Secondary | ICD-10-CM | POA: Diagnosis not present

## 2015-07-04 DIAGNOSIS — G4733 Obstructive sleep apnea (adult) (pediatric): Secondary | ICD-10-CM | POA: Diagnosis not present

## 2015-07-04 DIAGNOSIS — F329 Major depressive disorder, single episode, unspecified: Secondary | ICD-10-CM

## 2015-07-04 DIAGNOSIS — I480 Paroxysmal atrial fibrillation: Secondary | ICD-10-CM

## 2015-07-04 DIAGNOSIS — F32A Depression, unspecified: Secondary | ICD-10-CM | POA: Insufficient documentation

## 2015-07-04 NOTE — Progress Notes (Signed)
OFFICE NOTE  Chief Complaint:  Routine followup  Primary Care Physician: Chesley Noon, MD  HPI:  Jenna [name] is a 74 year old female with a history of mild aortic stenosis and a negative nuclear stress test in August 2013. Recently she had an episode of palpitations which she felt lasted up to about 10 minutes. During that time she felt a decrease in exercise tolerance with fatigue and no energy. This was concerning for AFib; however, she wore a monitor for a short period of time and it did not show that.  I set her up for a 30-day monitor, which she worse very faithfully between February 26 and December 31, 2012. The results indicated 1 very brief episode of AFib of less than 10 seconds in duration out of a total of over 40,000 seconds of monitoring. This correlates with a burden of less than 0.003%. At this point, given her low burden I feel that it is still reasonable to treat her with low-dose aspirin. However, we will continue to see her closely and adjust her anticoagulation as necessary. Since her last follow-up she denies any significant palpitations. She has reported some LE swelling, which she thinks is better now that she is off of her ARB?  Jenna [name] returns today for followup. She reports doing fairly well. In August she said she had an episode of age of fibrillation when coming home from the beach. That day she walked up and down the stairs several times and felt her heart racing. It never seemed to slow down. Finally she felt better the next day however she was fatigued and eventually recovered. Since that time she's had no further events. She does get some mild swelling in her legs. She is reporting some fatigue since starting a beta blocker and feels that it may be a side effect.  I saw Jenna [name] back today in the office. She seems to doing fairly well from a cardiac standpoint. She denies any significant palpitations and thinks that the diltiazem is generally controlled them.  If she is having A. fib is very infrequent and a low burden. She is maintained only on aspirin. Unfortunate she's been struggling with depression which occurred after grieving for the death of her husband. She's been apparently on several different antidepressive medicines with mixed results and recently on Wellbutrin which she thinks is somewhat helpful. She did have somewhat of a flat affect in the office today.  PMHx:  Past Medical History  Diagnosis Date  . RLS (restless legs syndrome)   . OSA (obstructive sleep apnea)   . Palpitations   . Fatigue   . Mild aortic stenosis   . Hypertension   . Hypothyroidism   . Cancer     melanoma    Past Surgical History  Procedure Laterality Date  . Lexiscan myoview  05/13/2012    No ECG changes. EKG negative for ischemia. No significant ischemia demonstrated.  . Transthoracic echocardiogram  02/18/2012    EF >55%, mild aortic stenosis  . Abdominal hysterectomy    . Cataract extraction      FAMHx:  Family History  Problem Relation Age of Onset  . Hypertension Mother   . Diabetes Mother   . Heart failure Father   . Arrhythmia Sister   . Hyperlipidemia Sister   . Hypertension Sister   . Healthy Brother   . Stroke Paternal Grandmother   . Cancer Paternal Grandfather   . Healthy Daughter   . Hypertension Son   .  Hyperlipidemia Son     SOCHx:   reports that she has never smoked. She has never used smokeless tobacco. She reports that she does not drink alcohol or use illicit drugs.  ALLERGIES:  Allergies  Allergen Reactions  . Crestor [Rosuvastatin]     Joints ached  . Lovastatin     Joints hurt all over body     ROS: A comprehensive review of systems was negative except for: Constitutional: positive for fatigue Cardiovascular: positive for lower extremity edema Behavioral/Psych: positive for depression  HOME MEDS: Current Outpatient Prescriptions  Medication Sig Dispense Refill  . acarbose (PRECOSE) 25 MG tablet Take 25 mg  by mouth 3 (three) times daily.      [name] ALPRAZolam (XANAX) 0.25 MG tablet Take 1 tablet (0.25 mg total) by mouth 2 (two) times daily as needed for anxiety. 60 tablet 5  . aspirin 81 MG tablet Take 81 mg by mouth daily.      [name] atorvastatin (LIPITOR) 40 MG tablet TAKE 1 TABLET (40 MG TOTAL) BY MOUTH DAILY. 30 tablet 5  . buPROPion (WELLBUTRIN XL) 150 MG 24 hr tablet Take 150 mg by mouth daily.    . Calcium Carbonate-Vitamin D (CALCIUM + D PO) Take 600 mg by mouth 2 (two) times daily. Once a day    . CHERRY PO Take by mouth. 1/2 cup before breakfast and supper    . diltiazem (CARDIZEM CD) 120 MG 24 hr capsule TAKE 1 CAPSULE (120 MG TOTAL) BY MOUTH DAILY. 30 capsule 3  . Ginger, Zingiber officinalis, (GINGER PO) Take 2 capsules by mouth daily.     [name] levothyroxine (SYNTHROID, LEVOTHROID) 88 MCG tablet Take 88 mcg by mouth daily before breakfast.    . Magnesium 250 MG TABS Take 1 tablet by mouth daily.    . naproxen (NAPROSYN) 500 MG tablet Take 500 mg by mouth 2 (two) times daily with a meal.    . Omega-3 Fatty Acids (FISH OIL) 1200 MG CAPS Take 1 at breakfast and 1 at supper    . omeprazole (PRILOSEC) 40 MG capsule Take 40 mg by mouth every evening.    . potassium chloride SA (K-DUR,KLOR-CON) 20 MEQ tablet Take 20 mEq by mouth 2 (two) times daily. Take with fluid pill    . rOPINIRole (REQUIP) 0.5 MG tablet Take 2-3 daily at bedtime as needed (Patient taking differently: Take 0.5 mg by mouth daily as needed. Take 2-3 daily at bedtime as needed) 90 tablet 3  . torsemide (DEMADEX) 5 MG tablet Take 5-10 mg by mouth daily as needed.    . TURMERIC PO Take 2 tablets by mouth daily.     . valsartan (DIOVAN) 160 MG tablet Take 160 mg by mouth daily.     No current facility-administered medications for this visit.    LABS/IMAGING: No results found for this or any previous visit (from the past 48 hour(s)). No results found.  VITALS: BP 142/74 mmHg  Pulse 64  Ht 5\' 5"  (1.651 m)  Wt 208 lb 4.8 oz  (94.484 kg)  BMI 34.66 kg/m2  EXAM: General appearance: alert and no distress Neck: no adenopathy, no carotid bruit, no JVD, supple, symmetrical, trachea midline and thyroid not enlarged, symmetric, no tenderness/mass/nodules Lungs: clear to auscultation bilaterally Heart: regular rate and rhythm, S1, S2 normal, 2-3/6 mid-peaking aortic murmur at RUSB Abdomen: soft, non-tender; bowel sounds normal; no masses,  no organomegaly Extremities: extremities normal, atraumatic, no cyanosis or edema Pulses: 2+ and symmetric Skin: Skin color, texture,  turgor normal. No rashes or lesions Neurologic: Grossly normal Psych: Mildly depressed, flat affect  EKG: Normal sinus rhythm at 64  ASSESSMENT: 1. Paroxysmal atrial fibrillation - CHADSVASC-2, very low burden on aspirin 2. HTN - controlled 3. Dyslipidemia - not on a statin, intolerant to Crestor and lovastatin 4. Mild aortic stenosis - unchanged by exam 5. Depression  PLAN: 1.  Mrs. Gambone reports very infrequent palpitations. It does not seem like this is occurring regularly enough to even identify whether she is having A. fib. Based on data think continuing on low-dose aspirin is reasonable for prevention of stroke despite her CHADSVASC score. Her cholesterol has been well controlled now on Lipitor. There is some mild systolic murmur of aortic stenosis which is stable. May want to consider an echocardiogram next year. She's been struggling with depression after the normal grieving of her husband's death. Hopefully that will improve on bupropion.  Plan to see her back annually or sooner as necessary.  [name], MD, [facility] Attending Cardiologist Ester C Terrin Imparato 07/04/2015, 5:28 PM

## 2015-07-04 NOTE — Patient Instructions (Signed)
Your physician wants you to follow-up in: 1 year with Dr. Hilty. You will receive a reminder letter in the mail two months in advance. If you don't receive a letter, please call our office to schedule the follow-up appointment.  

## 2015-07-13 ENCOUNTER — Other Ambulatory Visit: Payer: Self-pay | Admitting: Internal Medicine

## 2015-07-13 NOTE — Telephone Encounter (Signed)
Rx request sent to pharmacy.  

## 2015-07-19 ENCOUNTER — Ambulatory Visit: Payer: Medicare Other | Admitting: Pulmonary Disease

## 2015-07-24 ENCOUNTER — Ambulatory Visit (INDEPENDENT_AMBULATORY_CARE_PROVIDER_SITE_OTHER): Payer: Medicare Other | Admitting: Pulmonary Disease

## 2015-07-24 ENCOUNTER — Encounter: Payer: Self-pay | Admitting: Pulmonary Disease

## 2015-07-24 VITALS — BP 138/80 | HR 65 | Ht 65.0 in | Wt 209.0 lb

## 2015-07-24 DIAGNOSIS — G4733 Obstructive sleep apnea (adult) (pediatric): Secondary | ICD-10-CM | POA: Diagnosis not present

## 2015-07-24 DIAGNOSIS — G2581 Restless legs syndrome: Secondary | ICD-10-CM

## 2015-07-24 DIAGNOSIS — Z23 Encounter for immunization: Secondary | ICD-10-CM

## 2015-07-24 NOTE — Assessment & Plan Note (Signed)
Ct requip 

## 2015-07-24 NOTE — Assessment & Plan Note (Signed)
CPAP supplies will be renewed x 1 year Flu shot  Weight loss encouraged, compliance with goal of at least 4-6 hrs every night is the expectation. Advised against medications with sedative side effects Cautioned against driving when sleepy - understanding that sleepiness will vary on a day to day basis

## 2015-07-24 NOTE — Patient Instructions (Signed)
CPAP supplies will be renewed x 1 year Flu shot

## 2015-07-24 NOTE — Progress Notes (Signed)
   Subjective:    Patient ID: Jenna [name], female    DOB: 1941-08-11, 74 y.o.   MRN: [id number]  HPI  For FU of OSA & RLS Is also dealing with depression  Chief Complaint  Patient presents with  . Follow-up    Former [facility] pt following for OSA.  pt states in the morning she does not want to get up and during the day she gets sleepy she is not sure what it is. pt using CPAP every night for about 8 - 10 hrs.nose pillows and pressure good for pt. DME: [facility]    More sleepy Takes xanax at bedtime Takes requip at bedtime & sometimes in the afternoon. Uses nasal pillows Download 07/2015 - good usage Mask ok, pr ok, no dryness   NPSG 2005:  AHI 17/hr, optimal pressure 11cm Auto 2014:  Optimal pressure 13cm .  Review of Systems neg for any significant sore throat, dysphagia, itching, sneezing, nasal congestion or excess/ purulent secretions, fever, chills, sweats, unintended wt loss, pleuritic or exertional cp, hempoptysis, orthopnea pnd or change in chronic leg swelling. Also denies presyncope, palpitations, heartburn, abdominal pain, nausea, vomiting, diarrhea or change in bowel or urinary habits, dysuria,hematuria, rash, arthralgias, visual complaints, headache, numbness weakness or ataxia.     Objective:   Physical Exam  Gen. Pleasant, obese, in no distress ENT - no lesions, no post nasal drip Neck: No JVD, no thyromegaly, no carotid bruits Lungs: no use of accessory muscles, no dullness to percussion, decreased without rales or rhonchi  Cardiovascular: Rhythm regular, heart sounds  normal, no murmurs or gallops, no peripheral edema Musculoskeletal: No deformities, no cyanosis or clubbing , no tremors       Assessment & Plan:

## 2015-08-18 ENCOUNTER — Other Ambulatory Visit: Payer: Self-pay | Admitting: Pulmonary Disease

## 2015-08-18 DIAGNOSIS — G4733 Obstructive sleep apnea (adult) (pediatric): Secondary | ICD-10-CM

## 2015-08-18 DIAGNOSIS — Z9989 Dependence on other enabling machines and devices: Principal | ICD-10-CM

## 2015-08-30 ENCOUNTER — Emergency Department (HOSPITAL_COMMUNITY): Payer: Medicare Other

## 2015-08-30 ENCOUNTER — Emergency Department (HOSPITAL_COMMUNITY)
Admission: EM | Admit: 2015-08-30 | Discharge: 2015-08-30 | Disposition: A | Discharge status: Home / Self Care | Payer: Medicare Other | Attending: Emergency Medicine | Admitting: Emergency Medicine

## 2015-08-30 ENCOUNTER — Encounter (HOSPITAL_COMMUNITY): Payer: Self-pay | Admitting: Emergency Medicine

## 2015-08-30 DIAGNOSIS — W01198A Fall on same level from slipping, tripping and stumbling with subsequent striking against other object, initial encounter: Secondary | ICD-10-CM | POA: Insufficient documentation

## 2015-08-30 DIAGNOSIS — Y9289 Other specified places as the place of occurrence of the external cause: Secondary | ICD-10-CM | POA: Diagnosis not present

## 2015-08-30 DIAGNOSIS — Z85820 Personal history of malignant melanoma of skin: Secondary | ICD-10-CM | POA: Diagnosis not present

## 2015-08-30 DIAGNOSIS — S0083XA Contusion of other part of head, initial encounter: Secondary | ICD-10-CM | POA: Insufficient documentation

## 2015-08-30 DIAGNOSIS — S0081XA Abrasion of other part of head, initial encounter: Secondary | ICD-10-CM | POA: Diagnosis not present

## 2015-08-30 DIAGNOSIS — Y998 Other external cause status: Secondary | ICD-10-CM | POA: Diagnosis not present

## 2015-08-30 DIAGNOSIS — S39012A Strain of muscle, fascia and tendon of lower back, initial encounter: Secondary | ICD-10-CM | POA: Diagnosis not present

## 2015-08-30 DIAGNOSIS — I10 Essential (primary) hypertension: Secondary | ICD-10-CM | POA: Insufficient documentation

## 2015-08-30 DIAGNOSIS — E039 Hypothyroidism, unspecified: Secondary | ICD-10-CM | POA: Diagnosis not present

## 2015-08-30 DIAGNOSIS — Z7982 Long term (current) use of aspirin: Secondary | ICD-10-CM | POA: Diagnosis not present

## 2015-08-30 DIAGNOSIS — S0031XA Abrasion of nose, initial encounter: Secondary | ICD-10-CM | POA: Diagnosis not present

## 2015-08-30 DIAGNOSIS — S80211A Abrasion, right knee, initial encounter: Secondary | ICD-10-CM | POA: Insufficient documentation

## 2015-08-30 DIAGNOSIS — S0993XA Unspecified injury of face, initial encounter: Secondary | ICD-10-CM | POA: Diagnosis present

## 2015-08-30 DIAGNOSIS — S8001XA Contusion of right knee, initial encounter: Secondary | ICD-10-CM | POA: Diagnosis not present

## 2015-08-30 DIAGNOSIS — Z79899 Other long term (current) drug therapy: Secondary | ICD-10-CM | POA: Insufficient documentation

## 2015-08-30 DIAGNOSIS — Z791 Long term (current) use of non-steroidal anti-inflammatories (NSAID): Secondary | ICD-10-CM | POA: Insufficient documentation

## 2015-08-30 DIAGNOSIS — G2581 Restless legs syndrome: Secondary | ICD-10-CM | POA: Insufficient documentation

## 2015-08-30 DIAGNOSIS — Y9389 Activity, other specified: Secondary | ICD-10-CM | POA: Diagnosis not present

## 2015-08-30 DIAGNOSIS — W19XXXA Unspecified fall, initial encounter: Secondary | ICD-10-CM

## 2015-08-30 NOTE — Discharge Instructions (Signed)
Apply ice to affected areas 20 minutes at a time every 2 hours while awake for the next 2 days.  Tylenol or ibuprofen as needed for pain.  Return to the ER if symptoms significantly worsen or change.   Contusion A contusion is a deep bruise. Contusions are the result of a blunt injury to tissues and muscle fibers under the skin. The injury causes bleeding under the skin. The skin overlying the contusion may turn blue, purple, or yellow. Minor injuries will give you a painless contusion, but more severe contusions may stay painful and swollen for a few weeks.  CAUSES  This condition is usually caused by a blow, trauma, or direct force to an area of the body. SYMPTOMS  Symptoms of this condition include:  Swelling of the injured area.  Pain and tenderness in the injured area.  Discoloration. The area may have redness and then turn blue, purple, or yellow. DIAGNOSIS  This condition is diagnosed based on a physical exam and medical history. An X-ray, CT scan, or MRI may be needed to determine if there are any associated injuries, such as broken bones (fractures). TREATMENT  Specific treatment for this condition depends on what area of the body was injured. In general, the best treatment for a contusion is resting, icing, applying pressure to (compression), and elevating the injured area. This is often called the RICE strategy. Over-the-counter anti-inflammatory medicines may also be recommended for pain control.  HOME CARE INSTRUCTIONS   Rest the injured area.  If directed, apply ice to the injured area:  Put ice in a plastic bag.  Place a towel between your skin and the bag.  Leave the ice on for 20 minutes, 2-3 times per day.  If directed, apply light compression to the injured area using an elastic bandage. Make sure the bandage is not wrapped too tightly. Remove and reapply the bandage as directed by your health care provider.  If possible, raise (elevate) the injured area above  the level of your heart while you are sitting or lying down.  Take over-the-counter and prescription medicines only as told by your health care provider. SEEK MEDICAL CARE IF:  Your symptoms do not improve after several days of treatment.  Your symptoms get worse.  You have difficulty moving the injured area. SEEK IMMEDIATE MEDICAL CARE IF:   You have severe pain.  You have numbness in a hand or foot.  Your hand or foot turns pale or cold.   This information is not intended to replace advice given to you by your health care provider. Make sure you discuss any questions you have with your health care provider.   Document Released: 07/03/2005 Document Revised: 06/14/2015 Document Reviewed: 02/08/2015 Elsevier Interactive Patient Education [employer].

## 2015-08-30 NOTE — ED Notes (Signed)
MD at bedside. 

## 2015-08-30 NOTE — ED Notes (Addendum)
Patient states she tripped and fell approximately 1 hour ago face first onto concrete. Patient has small abrasion noted to chin. Complaining of pain to chin, right knee, and back. Patient ambulatory at triage. Denies LOC. Patient states "My chin feels really funny."

## 2015-08-30 NOTE — ED Provider Notes (Signed)
CSN: [id number]     Arrival date & time 08/30/15  1949 History  By signing my name below, I, Emmanuella Mensah, attest that this documentation has been prepared under the direction and in the presence of Veryl Speak, MD. Electronically Signed: Judithann Sauger, ED Scribe. 08/30/2015. 8:49 PM.    Chief Complaint  Patient presents with  . Fall  . Facial Injury  . Leg Pain    The history is provided by the patient. No language interpreter was used.   HPI Comments: Jenna [name] is a 73 y.o. female with a hx of arthritis and HTN who presents to the Emergency Department complaining of a abrasion on chin and gradually worsening moderate pain to the chin and right knee pain s/p fall that occurred when she tripped and fell face first on concrete approx. an hour PTA. She denies any LOC. She denies knocking out any teeth or nosebleeds. She  Also denies any neck pain, abdominal pain, or n/v/d. No alleviating factors noted. She reports that she was able to walk before the fall. She adds that her legs were swollen prior to the fall as well. Pt currently takes an aspirin daily.    Past Medical History  Diagnosis Date  . RLS (restless legs syndrome)   . OSA (obstructive sleep apnea)   . Palpitations   . Fatigue   . Mild aortic stenosis   . Hypertension   . Hypothyroidism   . Cancer ([place])     melanoma   Past Surgical History  Procedure Laterality Date  . Lexiscan myoview  05/13/2012    No ECG changes. EKG negative for ischemia. No significant ischemia demonstrated.  . Transthoracic echocardiogram  02/18/2012    EF >55%, mild aortic stenosis  . Abdominal hysterectomy    . Cataract extraction     Family History  Problem Relation Age of Onset  . Hypertension Mother   . Diabetes Mother   . Heart failure Father   . Arrhythmia Sister   . Hyperlipidemia Sister   . Hypertension Sister   . Healthy Brother   . Stroke Paternal Grandmother   . Cancer Paternal Grandfather   . Healthy Daughter   .  Hypertension Son   . Hyperlipidemia Son    Social History  Substance Use Topics  . Smoking status: Never Smoker   . Smokeless tobacco: Never Used  . Alcohol Use: No   OB History    No data available     Review of Systems  Constitutional: Negative for fever.  HENT: Negative for dental problem and nosebleeds.   Gastrointestinal: Negative for nausea, vomiting, abdominal pain and diarrhea.  Musculoskeletal: Positive for arthralgias. Negative for neck pain.  Skin:       Abrasion to chin  All other systems reviewed and are negative.     Allergies  Crestor and Lovastatin  Home Medications   Prior to Admission medications   Medication Sig Start Date End Date Taking? Authorizing Provider  buPROPion (WELLBUTRIN XL) 150 MG 24 hr tablet TAKE ONE TABLET (150 MG TOTAL) BY MOUTH EVERY MORNING. 08/07/15  Yes Historical Provider, MD  omeprazole (PRILOSEC) 40 MG capsule TAKE ONE CAPSULE BY MOUTH EVERY DAY 07/31/15  Yes Historical Provider, MD  acarbose (PRECOSE) 25 MG tablet Take 25 mg by mouth 3 (three) times daily.      Historical Provider, MD  ALPRAZolam (XANAX) 0.5 MG tablet TAKE 1 TABLET BY MOUTH TWICE A DAY AS NEEDED FOR SLEEP OR ANXIETY 07/17/15   Historical  Provider, MD  aspirin 81 MG tablet Take 81 mg by mouth daily.      Historical Provider, MD  atorvastatin (LIPITOR) 40 MG tablet TAKE 1 TABLET (40 MG TOTAL) BY MOUTH DAILY. 03/13/15   [name], MD  Calcium Carbonate-Vitamin D (CALCIUM + D PO) Take 600 mg by mouth 2 (two) times daily. Once a day    Historical Provider, MD  CHERRY PO Take by mouth. 1/2 cup before breakfast and supper    Historical Provider, MD  diltiazem (CARDIZEM CD) 120 MG 24 hr capsule TAKE ONE CAPSULE BY MOUTH EVERY DAY 07/13/15   [name], MD  Ginger, Zingiber officinalis, (GINGER PO) Take 2 capsules by mouth daily.     Historical Provider, MD  levothyroxine (SYNTHROID, LEVOTHROID) 88 MCG tablet Take 88 mcg by mouth daily before breakfast.     Historical Provider, MD  Magnesium 250 MG TABS Take 1 tablet by mouth daily.    Historical Provider, MD  naproxen (NAPROSYN) 500 MG tablet Take 500 mg by mouth 2 (two) times daily with a meal.    Historical Provider, MD  Omega-3 Fatty Acids (FISH OIL) 1200 MG CAPS Take 1 at breakfast and 1 at supper    Historical Provider, MD  omeprazole (PRILOSEC) 40 MG capsule Take 40 mg by mouth every evening.    Historical Provider, MD  potassium chloride SA (K-DUR,KLOR-CON) 20 MEQ tablet Take 20 mEq by mouth 2 (two) times daily. Take with fluid pill    Historical Provider, MD  rOPINIRole (REQUIP) 0.5 MG tablet Take 2-3 daily at bedtime as needed Patient taking differently: Take 0.5 mg by mouth daily as needed. Take 2-3 daily at bedtime as needed 09/20/14   Kathee Delton, MD  torsemide (DEMADEX) 5 MG tablet Take 5-10 mg by mouth daily as needed.    Historical Provider, MD  TURMERIC PO Take 2 tablets by mouth daily.     Historical Provider, MD  valsartan (DIOVAN) 160 MG tablet Take 160 mg by mouth daily.    Historical Provider, MD   BP 165/69 mmHg  Pulse 68  Temp(Src) 97.6 F (36.4 C) (Oral)  Resp 20  Ht 5\' 5"  (1.651 m)  Wt 210 lb 4 oz (95.369 kg)  BMI 34.99 kg/m2  SpO2 99% Physical Exam  Constitutional: She is oriented to person, place, and time. She appears well-developed and well-nourished. No distress.  HENT:  Head: Normocephalic and atraumatic.  There is swelling, abrasions, and ecchymosis to the anterior chin.   Nose is midline with no septal hematoma. There are some abrasions and swelling to the nose.   Eyes: EOM are normal.  Neck: Normal range of motion.  Cardiovascular: Normal rate, regular rhythm and normal heart sounds.   Pulmonary/Chest: Effort normal and breath sounds normal.  Abdominal: Soft. She exhibits no distension. There is no tenderness.  Musculoskeletal: Normal range of motion.  The right knee has some abrasions to the patella. There is no definite effusion or ligamentous  instability Good ROM with no crepitus.   Neurological: She is alert and oriented to person, place, and time.  Skin: Skin is warm and dry.  Psychiatric: She has a normal mood and affect. Judgment normal.  Nursing note and vitals reviewed.   ED Course  Procedures (including critical care time) DIAGNOSTIC STUDIES: Oxygen Saturation is 99% on RA, normal by my interpretation.    COORDINATION OF CARE: 8:11 PM- Pt advised of plan for treatment and pt agrees. Pt will receive knee and  lower back x-ray.    Labs Review Labs Reviewed - No data to display  Imaging Review Dg Lumbar Spine Complete  08/30/2015  CLINICAL DATA:  Pain following fall earlier today EXAM: LUMBAR SPINE - COMPLETE 4+ VIEW COMPARISON:  Lumbar MRI May 02, 2014 FINDINGS: Frontal, lateral, spot lumbosacral lateral, and bilateral oblique views were obtained. There are 5 non-rib-bearing lumbar type vertebral bodies. There is no fracture. There is 6 mm of anterolisthesis of L3 on L4. There is 5 mm of anterolisthesis of L4 on L5. The anterolisthesis at L4-5 is stable. The anterolisthesis at L3-4 has increased. There is marked disc space narrowing at L4-5 and L5-S1. There is moderate disc space narrowing at L3-4. There is facet osteoarthritic change at L4-5 and L5-S1 bilaterally. IMPRESSION: Osteoarthritic change at multiple levels. 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 Increase in spondylolisthesis at L3-4 compared to prior MR. these areas of spondylolisthesis are felt to be due to underlying spondylosis. No fracture is evident. Electronically Signed   By: Lowella Grip III M.D.   On: 08/30/2015 20:44   Dg Knee Complete 4 Views Right  08/30/2015  CLINICAL DATA:  Trip and fall injury 1 hour prior to arrival. Low back pain and right knee pain. Abrasions anterior to the right knee. EXAM: RIGHT KNEE - COMPLETE 4+ VIEW COMPARISON:  None. FINDINGS: Tricompartment degenerative changes in the right knee with joint space narrowing and  osteophytosis. No evidence of acute fracture or dislocation. No focal bone lesion or bone destruction. No significant effusion. Soft tissues are unremarkable. IMPRESSION: Tricompartment degenerative changes in the right knee. No acute fracture or dislocation. Electronically Signed   By: Lucienne Capers M.D.   On: 08/30/2015 20:41     Veryl Speak, MD has personally reviewed and evaluated these images as part of his medical decision-making.   EKG Interpretation None      MDM   Final diagnoses:  None    Patient presents after a fall on concrete. She has abrasions and swelling to her chin and bridge of her nose. CT scan of the maxillofacial bones are all negative. She also had x-rays of her right knee and lumbar spine which are negative as well. She will be discharged with rest, ice, when necessary follow-up if not improving.  I personally performed the services described in this documentation, which was scribed in my presence. The recorded information has been reviewed and is accurate.       Veryl Speak, MD 08/30/15 2113

## 2015-10-10 ENCOUNTER — Other Ambulatory Visit: Payer: Self-pay | Admitting: Internal Medicine

## 2015-10-10 NOTE — Telephone Encounter (Signed)
Rx request sent to pharmacy.  

## 2016-02-08 ENCOUNTER — Other Ambulatory Visit: Payer: Self-pay

## 2016-02-08 DIAGNOSIS — Z1231 Encounter for screening mammogram for malignant neoplasm of breast: Secondary | ICD-10-CM

## 2016-02-12 ENCOUNTER — Telehealth: Payer: Self-pay | Admitting: Internal Medicine

## 2016-02-12 NOTE — Telephone Encounter (Signed)
Patient dropped off lab results on 02/07/16 for MD review. Labs reviewed by Dr. Debara Pickett - no changes made based on the results.  LM with this information.

## 2016-03-07 ENCOUNTER — Encounter: Payer: Self-pay | Admitting: Internal Medicine

## 2016-03-19 ENCOUNTER — Ambulatory Visit: Payer: Medicare Other

## 2016-03-26 ENCOUNTER — Emergency Department (HOSPITAL_COMMUNITY): Payer: Medicare Other

## 2016-03-26 ENCOUNTER — Emergency Department (HOSPITAL_COMMUNITY)
Admission: EM | Admit: 2016-03-26 | Discharge: 2016-03-26 | Disposition: A | Discharge status: Home / Self Care | Payer: Medicare Other | Attending: Emergency Medicine | Admitting: Emergency Medicine

## 2016-03-26 ENCOUNTER — Encounter (HOSPITAL_COMMUNITY): Payer: Self-pay

## 2016-03-26 ENCOUNTER — Other Ambulatory Visit: Payer: Self-pay

## 2016-03-26 DIAGNOSIS — R739 Hyperglycemia, unspecified: Secondary | ICD-10-CM

## 2016-03-26 DIAGNOSIS — Z85820 Personal history of malignant melanoma of skin: Secondary | ICD-10-CM | POA: Diagnosis not present

## 2016-03-26 DIAGNOSIS — Z5181 Encounter for therapeutic drug level monitoring: Secondary | ICD-10-CM | POA: Insufficient documentation

## 2016-03-26 DIAGNOSIS — Z7984 Long term (current) use of oral hypoglycemic drugs: Secondary | ICD-10-CM | POA: Diagnosis not present

## 2016-03-26 DIAGNOSIS — R42 Dizziness and giddiness: Secondary | ICD-10-CM | POA: Diagnosis not present

## 2016-03-26 DIAGNOSIS — I10 Essential (primary) hypertension: Secondary | ICD-10-CM | POA: Insufficient documentation

## 2016-03-26 DIAGNOSIS — H7091 Unspecified mastoiditis, right ear: Secondary | ICD-10-CM | POA: Diagnosis not present

## 2016-03-26 DIAGNOSIS — Z7982 Long term (current) use of aspirin: Secondary | ICD-10-CM | POA: Insufficient documentation

## 2016-03-26 LAB — CBC
HCT: 37.3 % (ref 36.0–46.0)
Hemoglobin: 12.2 g/dL (ref 12.0–15.0)
MCH: 27.7 pg (ref 26.0–34.0)
MCHC: 32.7 g/dL (ref 30.0–36.0)
MCV: 84.6 fL (ref 78.0–100.0)
Platelets: 363 10*3/uL (ref 150–400)
RBC: 4.41 MIL/uL (ref 3.87–5.11)
RDW: 14.2 % (ref 11.5–15.5)
WBC: 7.7 10*3/uL (ref 4.0–10.5)

## 2016-03-26 LAB — I-STAT TROPONIN, ED: Troponin i, poc: 0 ng/mL (ref 0.00–0.08)

## 2016-03-26 LAB — DIFFERENTIAL
Basophils Absolute: 0 10*3/uL (ref 0.0–0.1)
Basophils Relative: 0 %
Eosinophils Absolute: 0.1 10*3/uL (ref 0.0–0.7)
Eosinophils Relative: 1 %
Lymphocytes Relative: 24 %
Lymphs Abs: 1.8 10*3/uL (ref 0.7–4.0)
Monocytes Absolute: 0.5 10*3/uL (ref 0.1–1.0)
Monocytes Relative: 7 %
Neutro Abs: 5.3 10*3/uL (ref 1.7–7.7)
Neutrophils Relative %: 68 %

## 2016-03-26 LAB — I-STAT CHEM 8, ED
BUN: 21 mg/dL — ABNORMAL HIGH (ref 6–20)
Calcium, Ion: 1.1 mmol/L — ABNORMAL LOW (ref 1.13–1.30)
Chloride: 97 mmol/L — ABNORMAL LOW (ref 101–111)
Creatinine, Ser: 1.1 mg/dL — ABNORMAL HIGH (ref 0.44–1.00)
Glucose, Bld: 151 mg/dL — ABNORMAL HIGH (ref 65–99)
HCT: 39 % (ref 36.0–46.0)
Hemoglobin: 13.3 g/dL (ref 12.0–15.0)
Potassium: 3.6 mmol/L (ref 3.5–5.1)
Sodium: 136 mmol/L (ref 135–145)
TCO2: 28 mmol/L (ref 0–100)

## 2016-03-26 LAB — COMPREHENSIVE METABOLIC PANEL
ALT: 22 U/L (ref 14–54)
AST: 20 U/L (ref 15–41)
Albumin: 3.7 g/dL (ref 3.5–5.0)
Alkaline Phosphatase: 63 U/L (ref 38–126)
Anion gap: 10 (ref 5–15)
BUN: 19 mg/dL (ref 6–20)
CO2: 26 mmol/L (ref 22–32)
Calcium: 9.7 mg/dL (ref 8.9–10.3)
Chloride: 98 mmol/L — ABNORMAL LOW (ref 101–111)
Creatinine, Ser: 1.06 mg/dL — ABNORMAL HIGH (ref 0.44–1.00)
GFR calc Af Amer: 59 mL/min — ABNORMAL LOW (ref >60)
GFR calc non Af Amer: 51 mL/min — ABNORMAL LOW (ref >60)
Glucose, Bld: 150 mg/dL — ABNORMAL HIGH (ref 65–99)
Potassium: 3.6 mmol/L (ref 3.5–5.1)
Sodium: 134 mmol/L — ABNORMAL LOW (ref 135–145)
Total Bilirubin: 0.5 mg/dL (ref 0.3–1.2)
Total Protein: 6.9 g/dL (ref 6.5–8.1)

## 2016-03-26 LAB — PROTIME-INR
INR: 0.95 (ref 0.00–1.49)
Prothrombin Time: 12.9 seconds (ref 11.6–15.2)

## 2016-03-26 LAB — APTT: aPTT: 22 seconds — ABNORMAL LOW (ref 24–37)

## 2016-03-26 MED ORDER — MECLIZINE HCL 25 MG PO TABS: 25.0000 mg | ORAL_TABLET | Freq: Three times a day (TID) | ORAL | Status: DC | PRN

## 2016-03-26 MED ORDER — CIPROFLOXACIN HCL 500 MG PO TABS: 500.0000 mg | ORAL_TABLET | Freq: Two times a day (BID) | ORAL | Status: DC

## 2016-03-26 MED ORDER — LORAZEPAM 2 MG/ML IJ SOLN
0.5000 mg | Freq: Once | INTRAMUSCULAR | Status: AC
  Administered 2016-03-26: 0.5 mg via INTRAVENOUS
  Filled 2016-03-26: qty 1

## 2016-03-26 MED ORDER — AMOXICILLIN-POT CLAVULANATE 875-125 MG PO TABS: 1.0000 | ORAL_TABLET | Freq: Two times a day (BID) | ORAL | Status: DC

## 2016-03-26 MED ORDER — CLINDAMYCIN HCL 150 MG PO CAPS: 150.0000 mg | ORAL_CAPSULE | Freq: Four times a day (QID) | ORAL | Status: DC

## 2016-03-26 NOTE — Consult Note (Signed)
Neurology Consultation Reason for Consult: Vertigo Referring Physician: Jeanell Sparrow, D  CC: Vertigo  History is obtained from: Patient  HPI: Jenna [name] is a 75 y.o. female who presents with sudden onset vertigo that happened around 4 PM. She called EMS and they noticed that she was unsteady and had nystagmus and therefore activated code stroke. She states that she has had several episodes like this before, the most severe in April. In March 2016, it is noted that she had vertigo at that time as well.   LKW: 4 PM tpa given?: no, mild symptoms    ROS: A 14 point ROS was performed and is negative except as noted in the HPI.   Past Medical History  Diagnosis Date  . RLS (restless legs syndrome)   . OSA (obstructive sleep apnea)   . Palpitations   . Fatigue   . Mild aortic stenosis   . Hypertension   . Hypothyroidism   . Cancer ([place])     melanoma     Family History  Problem Relation Age of Onset  . Hypertension Mother   . Diabetes Mother   . Heart failure Father   . Arrhythmia Sister   . Hyperlipidemia Sister   . Hypertension Sister   . Healthy Brother   . Stroke Paternal Grandmother   . Cancer Paternal Grandfather   . Healthy Daughter   . Hypertension Son   . Hyperlipidemia Son      Social History:  reports that she has never smoked. She has never used smokeless tobacco. She reports that she does not drink alcohol or use illicit drugs.   Exam: Current vital signs: BP 153/64 mmHg  Pulse 55  Temp(Src) 97.6 F (36.4 C) (Oral)  Resp 13  Wt 94.4 kg (208 lb 1.8 oz)  SpO2 93% Vital signs in last 24 hours: Temp:  [97.6 F (36.4 C)] 97.6 F (36.4 C) (06/20 1833) Pulse Rate:  [55] 55 (06/20 1833) Resp:  [13] 13 (06/20 1833) BP: (153)/(64) 153/64 mmHg (06/20 1833) SpO2:  [93 %-100 %] 93 % (06/20 1833) Weight:  [94.4 kg (208 lb 1.8 oz)] 94.4 kg (208 lb 1.8 oz) (06/20 1816)   Physical Exam  Constitutional: Appears well-developed and well-nourished.  Psych: Affect  appropriate to situation Eyes: No scleral injection HENT: No OP obstrucion Head: Normocephalic.  Cardiovascular: Normal rate and regular rhythm.  Respiratory: Effort normal and breath sounds normal to anterior ascultation GI: Soft.  No distension. There is no tenderness.  Skin: WDI  Neuro: Mental Status: Patient is awake, alert, oriented to person, place, month, year, and situation. Patient is able to give a clear and coherent history. No signs of aphasia or neglect Cranial Nerves: II: Visual Fields are full. Pupils are equal, round, and reactive to light.   III,IV, VI: EOMI without ptosis or diploplia. She has persistent right beating nystagmus with rightward gaze, even with leftward gaze, she occasionally has some right beating nystagmus though much less prominent V: Facial sensation is symmetric to temperature VII: Facial movement is symmetric.  VIII: hearing is intact to voice X: Uvula elevates symmetrically XI: Shoulder shrug is symmetric. XII: tongue is midline without atrophy or fasciculations.  Motor: Tone is normal. Bulk is normal. 5/5 strength was present in all four extremities.  Sensory: Sensation is symmetric to light touch and temperature in the arms and legs. Deep Tendon Reflexes: 2+ and symmetric in the biceps and patellae.  Plantars: Toes are downgoing bilaterally.  Cerebellar: FNF and HKS with no  clear ataxia on either side, she is slow at first but with encouragement does perform better speed.  She has a negative Dix-Hallpike.    I have reviewed labs in epic and the results pertinent to this consultation are: CMP-mild hyponatremia, elevated glucose  I have reviewed the images obtained: CT head-no acute finding  Impression: 75 year old female with a history of multiple episodes of recurrent vertigo who presents with another episode. Based on her current physical exam, I cannot exclude the possibility of a central cause and do favor an MRI to rule out  possibility of stroke. My suspicion, however, is that this represents benign positional vertigo. If this is negative, then no further workup is needed but could consider physical therapy if she is still debilitated.  Recommendations: 1) MRI brain 2) if positive, stroke workup, if negative physical therapy   Roland Rack, MD [facility] [phone]  If 7pm- 7am, please page neurology on call as listed in [place].

## 2016-03-26 NOTE — ED Notes (Signed)
Pt. Coming from home via [facility] for stroke like symptoms with sudden onset 1600. EMS noted pt. To have limb ataxia and nystagmus that family reports as abnormal. Pt. Denies blood thinners. Stroke team at bedside.

## 2016-03-26 NOTE — Discharge Instructions (Signed)
Vertigo Vertigo means that you feel like you are moving when you are not. Vertigo can also make you feel like things around you are moving when they are not. This feeling can come and go at any time. Vertigo often goes away on its own. HOME CARE  Avoid making fast movements.  Avoid driving.  Avoid using heavy machinery.  Avoid doing any task or activity that might cause danger to you or other people if you would have a vertigo attack while you are doing it.  Sit down right away if you feel dizzy or have trouble with your balance.  Take over-the-counter and prescription medicines only as told by your doctor.  Follow instructions from your doctor about which positions or movements you should avoid.  Drink enough fluid to keep your pee (urine) clear or pale yellow.  Keep all follow-up visits as told by your doctor. This is important. GET HELP IF:  Medicine does not help your vertigo.  You have a fever.  Your problems get worse or you have new symptoms.  Your family or friends see changes in your behavior.  You feel sick to your stomach (nauseous) or you throw up (vomit).  You have a "pins and needles" feeling or you are numb in part of your body. GET HELP RIGHT AWAY IF:  You have trouble moving or talking.  You are always dizzy.  You pass out (faint).  You get very bad headaches.  You feel weak or have trouble using your hands, arms, or legs.  You have changes in your hearing.  You have changes in your seeing (vision).  You get a stiff neck.  Bright light starts to bother you.   This information is not intended to replace advice given to you by your health care provider. Make sure you discuss any questions you have with your health care provider.   Document Released: 07/02/2008 Document Revised: 06/14/2015 Document Reviewed: 01/16/2015 Elsevier Interactive Patient Education 2016 [employer]  

## 2016-03-26 NOTE — ED Notes (Signed)
EDP at bedside  

## 2016-03-26 NOTE — ED Provider Notes (Signed)
CSN: [id number]     Arrival date & time 03/26/16  1758 History   First MD Initiated Contact with Patient 03/26/16 1804     Chief Complaint  Patient presents with  . Code Stroke    An emergency department physician performed an initial assessment on this suspected stroke patient at 47. (Consider location/radiation/quality/duration/timing/severity/associated sxs/prior Treatment) HPI 75 year old female history of mild aortic stenosis, hypertension presents today with sudden onset of vertigo. She was initiated as a code stroke prior to my evaluation. Dr. Leonel Ramsay is at bedside on my evaluation. She has sensation of being off balance, spinning, nausea, and has vomited. Her symptoms are improving now. She has not attempted to walk. Onset of symptoms began at 1600 and patient seen on evaluation here at 1800. Past Medical History  Diagnosis Date  . RLS (restless legs syndrome)   . OSA (obstructive sleep apnea)   . Palpitations   . Fatigue   . Mild aortic stenosis   . Hypertension   . Hypothyroidism   . Cancer ([place])     melanoma   Past Surgical History  Procedure Laterality Date  . Lexiscan myoview  05/13/2012    No ECG changes. EKG negative for ischemia. No significant ischemia demonstrated.  . Transthoracic echocardiogram  02/18/2012    EF >55%, mild aortic stenosis  . Abdominal hysterectomy    . Cataract extraction     Family History  Problem Relation Age of Onset  . Hypertension Mother   . Diabetes Mother   . Heart failure Father   . Arrhythmia Sister   . Hyperlipidemia Sister   . Hypertension Sister   . Healthy Brother   . Stroke Paternal Grandmother   . Cancer Paternal Grandfather   . Healthy Daughter   . Hypertension Son   . Hyperlipidemia Son    Social History  Substance Use Topics  . Smoking status: Never Smoker   . Smokeless tobacco: Never Used  . Alcohol Use: No   OB History    No data available     Review of Systems  All other systems reviewed and are  negative.     Allergies  Crestor and Lovastatin  Home Medications   Prior to Admission medications   Medication Sig Start Date End Date Taking? Authorizing Provider  acarbose (PRECOSE) 25 MG tablet Take 25 mg by mouth 3 (three) times daily.      Historical Provider, MD  ALPRAZolam Duanne Moron) 0.5 MG tablet TAKE 1.5 TABLETS EVERY DAY AT BEDTIME 07/17/15   Historical Provider, MD  aspirin 81 MG tablet Take 81 mg by mouth daily.      Historical Provider, MD  atorvastatin (LIPITOR) 40 MG tablet TAKE 1 TABLET (40 MG TOTAL) BY MOUTH DAILY. 10/10/15   [name], MD  buPROPion (WELLBUTRIN XL) 150 MG 24 hr tablet TAKE ONE TABLET (150 MG TOTAL) BY MOUTH EVERY MORNING. 08/07/15   Historical Provider, MD  Calcium Carbonate-Vitamin D (CALCIUM + D PO) Take 600 mg by mouth 2 (two) times daily. Once a day    Historical Provider, MD  CHERRY PO Take by mouth. 1/2 cup before breakfast and supper    Historical Provider, MD  diltiazem (CARDIZEM CD) 120 MG 24 hr capsule TAKE ONE CAPSULE BY MOUTH EVERY DAY 07/13/15   [name], MD  Ginger, Zingiber officinalis, (GINGER PO) Take 2 capsules by mouth daily.     Historical Provider, MD  levothyroxine (SYNTHROID, LEVOTHROID) 88 MCG tablet Take 88 mcg by mouth daily before  breakfast.    Historical Provider, MD  Magnesium 250 MG TABS Take 1 tablet by mouth daily.    Historical Provider, MD  naproxen (NAPROSYN) 500 MG tablet Take 500 mg by mouth 2 (two) times daily with a meal.    Historical Provider, MD  Omega-3 Fatty Acids (FISH OIL) 1200 MG CAPS Take 1 capsule by mouth 2 (two) times daily. Take 1 at breakfast and 1 at supper    Historical Provider, MD  omeprazole (PRILOSEC) 40 MG capsule TAKE ONE CAPSULE BY MOUTH EVERY DAY 07/31/15   Historical Provider, MD  potassium chloride SA (K-DUR,KLOR-CON) 20 MEQ tablet Take 20 mEq by mouth daily as needed (WHEN TAKING FLUID PILL (TORSEMIDE)). Take with fluid pill    Historical Provider, MD  rOPINIRole (REQUIP) 0.5 MG  tablet Take 2-3 daily at bedtime as needed Patient taking differently: Take 0.5 mg by mouth 2 (two) times daily. TAKES ONE TWICE DAILY. MAY TAKE TWO AT BEDTIMES IF NEEDED 09/20/14   Kathee Delton, MD  torsemide (DEMADEX) 5 MG tablet Take 5-10 mg by mouth daily as needed (FOR FLUID).     Historical Provider, MD  TURMERIC PO Take 2 tablets by mouth daily.     Historical Provider, MD  valsartan (DIOVAN) 160 MG tablet Take 160 mg by mouth daily.    Historical Provider, MD   BP 153/64 mmHg  Pulse 55  Temp(Src) 97.6 F (36.4 C) (Oral)  Resp 13  Wt 94.4 kg  SpO2 93% Physical Exam  Constitutional: She is oriented to person, place, and time. She appears well-developed and well-nourished.  HENT:  Head: Normocephalic and atraumatic.  Right Ear: External ear normal.  Left Ear: External ear normal.  Nose: Nose normal.  Mouth/Throat: Oropharynx is clear and moist.  Eyes: Conjunctivae and EOM are normal. Pupils are equal, round, and reactive to light.  Right gaze nystagmus.  Neck: Normal range of motion. Neck supple.  Cardiovascular: Normal rate, regular rhythm, normal heart sounds and intact distal pulses.   Pulmonary/Chest: Effort normal and breath sounds normal.  Abdominal: Soft. Bowel sounds are normal.  Musculoskeletal: Normal range of motion.  Neurological: She is alert and oriented to person, place, and time. She has normal reflexes.  Skin: Skin is warm and dry.  Psychiatric: She has a normal mood and affect. Her behavior is normal. Judgment and thought content normal.  Nursing note and vitals reviewed.   ED Course  Procedures (including critical care time) Labs Review Labs Reviewed  APTT - Abnormal; Notable for the following:    aPTT 22 (*)    All other components within normal limits  COMPREHENSIVE METABOLIC PANEL - Abnormal; Notable for the following:    Sodium 134 (*)    Chloride 98 (*)    Glucose, Bld 150 (*)    Creatinine, Ser 1.06 (*)    GFR calc non Af Amer 51 (*)     GFR calc Af Amer 59 (*)    All other components within normal limits  I-STAT CHEM 8, ED - Abnormal; Notable for the following:    Chloride 97 (*)    BUN 21 (*)    Creatinine, Ser 1.10 (*)    Glucose, Bld 151 (*)    Calcium, Ion 1.10 (*)    All other components within normal limits  PROTIME-INR  CBC  DIFFERENTIAL  I-STAT TROPOININ, ED    Imaging Review Ct Head Wo Contrast  03/26/2016  CLINICAL DATA:  Acute onset vertigo.  Code stroke. EXAM: CT  HEAD WITHOUT CONTRAST TECHNIQUE: Contiguous axial images were obtained from the base of the skull through the vertex without intravenous contrast. COMPARISON:  12/16/2014 FINDINGS: No evidence of intracranial hemorrhage, brain edema, or other signs of acute infarction. No evidence of intracranial mass lesion or mass effect. No abnormal extraaxial fluid collections identified. Ventricles are normal in size. No skull abnormality identified. IMPRESSION: Negative unenhanced head CT. These results were called by telephone at the time of interpretation on 03/26/2016 at 6:18 pm to Dr. Leonel Ramsay, who verbally acknowledged these results. Electronically Signed   By: Earle Gell M.D.   On: 03/26/2016 18:22   Mr Brain Wo Contrast  03/26/2016  CLINICAL DATA:  Sudden onset vertigo today. Concern for cerebellar infarct. EXAM: MRI HEAD WITHOUT CONTRAST TECHNIQUE: Multiplanar, multiecho pulse sequences of the brain and surrounding structures were obtained without intravenous contrast. COMPARISON:  Head CT 03/26/2016 and MRI 12/16/2014 FINDINGS: There is no evidence of acute infarct, intracranial hemorrhage, mass, midline shift, or extra-axial fluid collection. Minimal periventricular white matter T2 hyperintensity and a subcentimeter focus of T2 hyperintensity in the right frontal operculum are unchanged from the prior MRI and within normal limits for age. Prior bilateral cataract extraction. There is a trace right mastoid effusion. The paranasal sinuses are clear. Major  intracranial vascular flow voids are preserved. IMPRESSION: 1. No acute intracranial abnormality. Unremarkable appearance of the brain for age. 2. Trace right mastoid effusion. Electronically Signed   By: Logan Bores M.D.   On: 03/26/2016 20:52   I have personally reviewed and evaluated these images and lab results as part of my medical decision-making. ED ECG REPORT   Date: 03/26/2016  Rate: 53  Rhythm: normal sinus rhythm  QRS Axis: normal  Intervals: normal  ST/T Wave abnormalities: normal  Conduction Disutrbances:nonspecific intraventricular conduction delay  Narrative Interpretation:   Old EKG Reviewed: unchanged  I have personally reviewed the EKG tracing and agree with the computerized printout as noted.    MDM   Final diagnoses:  Vertigo  Mastoiditis, right  Hyperglycemia  75 year old female with acute onset of vertigo and difficulty walking. Seen and assessed as code stroke. MR I here is negative for stroke. Patient has been up and ambulated to bathroom with minimal assistance. I have discussed results of tests with patient. Given the right mastoid effusion plan antibiotics. She is advised regarding the mild hyponatremia, increased creatinine from baseline, and hyperglycemia. She is given referral to neurology. I've advised that she have somebody stay with her for the next several nights until it is clear that she is not having any difficulty ambulating. Her daughter is present and states they will stay with her. She is advised regarding follow-up for the above and voices understanding.    Pattricia Boss, MD 03/26/16 2155

## 2016-04-01 ENCOUNTER — Ambulatory Visit
Admission: RE | Admit: 2016-04-01 | Discharge: 2016-04-01 | Disposition: A | Discharge status: Home / Self Care | Payer: Medicare Other | Source: Ambulatory Visit

## 2016-04-01 ENCOUNTER — Other Ambulatory Visit: Payer: Self-pay | Admitting: Family Medicine

## 2016-04-01 DIAGNOSIS — Z1231 Encounter for screening mammogram for malignant neoplasm of breast: Secondary | ICD-10-CM

## 2016-06-06 ENCOUNTER — Other Ambulatory Visit: Payer: Self-pay | Admitting: Internal Medicine

## 2016-06-28 ENCOUNTER — Other Ambulatory Visit: Payer: Self-pay | Admitting: Neurosurgery

## 2016-06-28 DIAGNOSIS — M47816 Spondylosis without myelopathy or radiculopathy, lumbar region: Secondary | ICD-10-CM

## 2016-07-08 ENCOUNTER — Ambulatory Visit (INDEPENDENT_AMBULATORY_CARE_PROVIDER_SITE_OTHER): Payer: Medicare Other | Admitting: Internal Medicine

## 2016-07-08 ENCOUNTER — Encounter: Payer: Self-pay | Admitting: Internal Medicine

## 2016-07-08 VITALS — BP 141/79 | HR 72 | Ht 66.0 in | Wt 211.4 lb

## 2016-07-08 DIAGNOSIS — G4733 Obstructive sleep apnea (adult) (pediatric): Secondary | ICD-10-CM | POA: Diagnosis not present

## 2016-07-08 DIAGNOSIS — I10 Essential (primary) hypertension: Secondary | ICD-10-CM

## 2016-07-08 DIAGNOSIS — I350 Nonrheumatic aortic (valve) stenosis: Secondary | ICD-10-CM | POA: Diagnosis not present

## 2016-07-08 DIAGNOSIS — F329 Major depressive disorder, single episode, unspecified: Secondary | ICD-10-CM

## 2016-07-08 DIAGNOSIS — F32A Depression, unspecified: Secondary | ICD-10-CM

## 2016-07-08 NOTE — Patient Instructions (Signed)
Medication Instructions:  Your physician recommends that you continue on your current medications as directed. Please refer to the Current Medication list given to you today.  Labwork: NONE   Testing/Procedures: NONE   Follow-Up: Your physician wants you to follow-up in: 12 MONTHS WITH DR HILTY. You will receive a reminder letter in the mail two months in advance. If you don't receive a letter, please call our office to schedule the follow-up appointment.  Any Other Special Instructions Will Be Listed Below (If Applicable).     If you need a refill on your cardiac medications before your next appointment, please call your pharmacy.  

## 2016-07-10 ENCOUNTER — Ambulatory Visit
Admission: RE | Admit: 2016-07-10 | Discharge: 2016-07-10 | Disposition: A | Discharge status: Home / Self Care | Payer: Medicare Other | Source: Ambulatory Visit | Attending: Neurosurgery | Admitting: Neurosurgery

## 2016-07-10 DIAGNOSIS — M47816 Spondylosis without myelopathy or radiculopathy, lumbar region: Secondary | ICD-10-CM

## 2016-07-10 NOTE — Progress Notes (Signed)
OFFICE NOTE  Chief Complaint:  Routine followup  Primary Care Physician: Chesley Noon, MD  HPI:  Jenna [name] is a 75 year old female with a history of mild aortic stenosis and a negative nuclear stress test in August 2013. Recently she had an episode of palpitations which she felt lasted up to about 10 minutes. During that time she felt a decrease in exercise tolerance with fatigue and no energy. This was concerning for AFib; however, she wore a monitor for a short period of time and it did not show that.  I set her up for a 30-day monitor, which she worse very faithfully between February 26 and December 31, 2012. The results indicated 1 very brief episode of AFib of less than 10 seconds in duration out of a total of over 40,000 seconds of monitoring. This correlates with a burden of less than 0.003%. At this point, given her low burden I feel that it is still reasonable to treat her with low-dose aspirin. However, we will continue to see her closely and adjust her anticoagulation as necessary. Since her last follow-up she denies any significant palpitations. She has reported some LE swelling, which she thinks is better now that she is off of her ARB?  [name] returns today for followup. She reports doing fairly well. In August she said she had an episode of age of fibrillation when coming home from the beach. That day she walked up and down the stairs several times and felt her heart racing. It never seemed to slow down. Finally she felt better the next day however she was fatigued and eventually recovered. Since that time she's had no further events. She does get some mild swelling in her legs. She is reporting some fatigue since starting a beta blocker and feels that it may be a side effect.  I saw [name] back today in the office. She seems to doing fairly well from a cardiac standpoint. She denies any significant palpitations and thinks that the diltiazem is generally controlled them.  If she is having A. fib is very infrequent and a low burden. She is maintained only on aspirin. Unfortunate she's been struggling with depression which occurred after grieving for the death of her husband. She's been apparently on several different antidepressive medicines with mixed results and recently on Wellbutrin which she thinks is somewhat helpful. She did have somewhat of a flat affect in the office today.  07/08/2016  [name] returns today for follow-up. She is doing generally well and denies any palpitations of significance. EKG today shows sinus rhythm at 72. Her blood pressure is mildly elevated 141/79. Weight is up a few pounds. She continues to struggle with depression related to the death of her husband which is now a couple of years ago.  PMHx:  Past Medical History:  Diagnosis Date  . Cancer ([place])    melanoma  . Fatigue   . Hypertension   . Hypothyroidism   . Mild aortic stenosis   . OSA (obstructive sleep apnea)   . Palpitations   . RLS (restless legs syndrome)     Past Surgical History:  Procedure Laterality Date  . ABDOMINAL HYSTERECTOMY    . CATARACT EXTRACTION    . LEXISCAN MYOVIEW  05/13/2012   No ECG changes. EKG negative for ischemia. No significant ischemia demonstrated.  . TRANSTHORACIC ECHOCARDIOGRAM  02/18/2012   EF >55%, mild aortic stenosis    FAMHx:  Family History  Problem Relation Age of Onset  .  Hypertension Mother   . Diabetes Mother   . Heart failure Father   . Arrhythmia Sister   . Hyperlipidemia Sister   . Hypertension Sister   . Healthy Brother   . Stroke Paternal Grandmother   . Cancer Paternal Grandfather   . Healthy Daughter   . Hypertension Son   . Hyperlipidemia Son     SOCHx:   reports that she has never smoked. She has never used smokeless tobacco. She reports that she does not drink alcohol or use drugs.  ALLERGIES:  Allergies  Allergen Reactions  . Crestor [Rosuvastatin] Other (See Comments)    Joints ached  .  Lovastatin Other (See Comments)    Joints hurt all over body     ROS: Pertinent items noted in HPI and remainder of comprehensive ROS otherwise negative.  HOME MEDS: Current Outpatient Prescriptions  Medication Sig Dispense Refill  . acarbose (PRECOSE) 25 MG tablet Take 25 mg by mouth 3 (three) times daily.      [name] acetaminophen (TYLENOL) 500 MG tablet Take 500-1,000 mg by mouth every 6 (six) hours as needed for mild pain, moderate pain or headache.    . ALPRAZolam (XANAX) 0.5 MG tablet Take 1 tablet by mouth 2 (two) times daily.    [name] aspirin 81 MG tablet Take 81 mg by mouth daily.      [name] atorvastatin (LIPITOR) 40 MG tablet TAKE 1 TABLET (40 MG TOTAL) BY MOUTH DAILY. 30 tablet 8  . buPROPion (WELLBUTRIN XL) 150 MG 24 hr tablet TAKE ONE TABLET (150 MG TOTAL) BY MOUTH EVERY MORNING.    . Calcium Carbonate-Vitamin D (CALCIUM + D PO) Take 600 mg by mouth 2 (two) times daily.     [name] CHERRY PO Take by mouth. Drink 1/2 cup before breakfast and 1/2 cup before supper    . diltiazem (CARDIZEM CD) 120 MG 24 hr capsule TAKE ONE CAPSULE BY MOUTH EVERY DAY 30 capsule 10  . Ginger, Zingiber officinalis, (GINGER PO) Take 2 capsules by mouth daily.     [name] levothyroxine (SYNTHROID, LEVOTHROID) 88 MCG tablet Take 88 mcg by mouth daily before breakfast.    . Magnesium 250 MG TABS Take 2 tablets by mouth daily.     . meclizine (ANTIVERT) 25 MG tablet Take 1 tablet (25 mg total) by mouth 3 (three) times daily as needed for dizziness. 30 tablet 0  . meloxicam (MOBIC) 15 MG tablet Take 15 mg by mouth daily.    . Multiple Vitamins-Minerals (PRESERVISION AREDS 2) CAPS Take 1 capsule by mouth 2 (two) times daily.    . Omega-3 Fatty Acids (FISH OIL) 1200 MG CAPS Take 1 capsule by mouth 2 (two) times daily. Take 1 at breakfast and 1 at supper    . omeprazole (PRILOSEC) 40 MG capsule TAKE ONE CAPSULE BY MOUTH EVERY DAY    . ONE TOUCH ULTRA TEST test strip Use twice daily  1  . potassium chloride SA (K-DUR,KLOR-CON) 20  MEQ tablet Take 20 mEq by mouth daily as needed (WHEN TAKING FLUID PILL (TORSEMIDE)). Take with fluid pill    . potassium chloride SA (KLOR-CON M20) 20 MEQ tablet Take 1 tablet by mouth 2 (two) times daily.    [name] rOPINIRole (REQUIP) 0.5 MG tablet Take 2-3 daily at bedtime as needed (Patient taking differently: Take 0.5-1 mg by mouth 2 (two) times daily. 0.5 mg by mouth midday then 0.5-1 mg at bedtime) 90 tablet 3  . torsemide (DEMADEX) 5 MG tablet Take 5-15 mg  by mouth daily as needed (FOR FLUID).     . TURMERIC PO Take 1 tablet by mouth 2 (two) times daily.     . valsartan (DIOVAN) 160 MG tablet Take 160 mg by mouth daily.     No current facility-administered medications for this visit.     LABS/IMAGING: No results found for this or any previous visit (from the past 48 hour(s)). Mr Lumbar Spine Wo Contrast  Result Date: 07/10/2016 CLINICAL DATA:  75 y/o F; right-sided lower back pain radiating to the thigh with weakness. EXAM: MRI LUMBAR SPINE WITHOUT CONTRAST TECHNIQUE: Multiplanar, multisequence MR imaging of the lumbar spine was performed. No intravenous contrast was administered. COMPARISON:  05/02/2014 lumbar MRI.  08/30/2015 lumbar radiographs. FINDINGS: Segmentation:  Standard. Alignment: Cervical lordosis is maintained. Minimal L3-4 and grade 1 L4-5 anterolisthesis is stable. Vertebrae:  No fracture, evidence of discitis, or bone lesion. Conus medullaris: Extends to the L1-2 level and appears normal. Paraspinal and other soft tissues: Negative. Disc levels: Multilevel disc desiccation with mild disc space narrowing at L2 through L4, severe disc space narrowing of L4-5, moderate at L5-S1. L1-2: Small disc bulge and mild facet hypertrophy. No significant foraminal narrowing or canal stenosis. L2-3: Moderate disc bulge and extensive facet/ ligamentum flavum hypertrophy mild bilateral foraminal narrowing and lateral recess narrowing. The left anterior synovial cyst encroaching on the left lateral  recess has involuted. There is a new paraspinal synovial cyst measuring 6 mm on the left. Small left facet effusion. L3-4: Moderate disc bulge and extensive ligamentum flavum/ facet hypertrophy. Moderate right and mild-to-moderate left foraminal narrowing. L4-5: Probable interbody fusion and posterior osteophytes combined with facet hypertrophy. Mild bilateral foraminal and lateral recess narrowing. No significant canal stenosis L5-S1: Small disc bulge and posterior osteophytes with mild facet hypertrophy. Mild right-sided foraminal narrowing. No significant canal stenosis IMPRESSION: 1. Lumbar spondylosis with prominent facet arthropathy is overall stable from 05/02/2014. 2. Multilevel mild foraminal narrowing and moderate right L3-4 foraminal narrowing. 3. No significant canal stenosis or appreciable nerve root impingement. Electronically Signed   By: Kristine Garbe M.D.   On: 07/10/2016 16:07    VITALS: BP (!) 141/79   Pulse 72   Ht 5\' 6"  (1.676 m)   Wt 211 lb 6.4 oz (95.9 kg)   BMI 34.12 kg/m   EXAM: General appearance: alert and no distress Neck: no adenopathy, no carotid bruit, no JVD, supple, symmetrical, trachea midline and thyroid not enlarged, symmetric, no tenderness/mass/nodules Lungs: clear to auscultation bilaterally Heart: regular rate and rhythm, S1, S2 normal, 2-3/6 mid-peaking aortic murmur at RUSB Abdomen: soft, non-tender; bowel sounds normal; no masses,  no organomegaly Extremities: extremities normal, atraumatic, no cyanosis or edema Pulses: 2+ and symmetric Skin: Skin color, texture, turgor normal. No rashes or lesions Neurologic: Grossly normal Psych: Mildly depressed, flat affect  EKG: Normal sinus rhythm at 72  ASSESSMENT: 1. Paroxysmal atrial fibrillation - CHADSVASC-2, very low burden on aspirin 2. HTN - controlled 3. Dyslipidemia - not on a statin, intolerant to Crestor and lovastatin 4. Mild aortic stenosis - unchanged by  exam 5. Depression  PLAN: 1.  [name] is doing well with a very low burden of atrial fibrillation was taken off of warfarin at her request. She had concerns about bleeding and particularly after her husband suffered complications related to warfarin. Exam does show mild aortic stenosis which seems to be stable. Her blood pressure is fairly well-controlled. She needs to continue to work on weight loss and more activity.  Follow-up  annually.  [name], MD, [facility] Attending Cardiologist [place] [facility] Elina Streng 07/10/2016, 6:00 PM

## 2016-07-29 ENCOUNTER — Encounter: Payer: Self-pay | Admitting: Adult Health

## 2016-07-29 ENCOUNTER — Ambulatory Visit (INDEPENDENT_AMBULATORY_CARE_PROVIDER_SITE_OTHER): Payer: Medicare Other | Admitting: Adult Health

## 2016-07-29 DIAGNOSIS — Z23 Encounter for immunization: Secondary | ICD-10-CM

## 2016-07-29 DIAGNOSIS — G4733 Obstructive sleep apnea (adult) (pediatric): Secondary | ICD-10-CM | POA: Diagnosis not present

## 2016-07-29 DIAGNOSIS — G2581 Restless legs syndrome: Secondary | ICD-10-CM

## 2016-07-29 NOTE — Patient Instructions (Addendum)
Continue on CPAP At bedtime  .  Remain active, work on weight loss.  Do not drive if sleepy.  Continue on Requip.  Flu shot today .  Follow up Dr. Elsworth Soho  In 1 year and As needed

## 2016-07-29 NOTE — Assessment & Plan Note (Signed)
Controlled on CPAP   Plan  Patient Instructions  Continue on CPAP At bedtime  .  Remain active, work on weight loss.  Do not drive if sleepy.  Continue on Requip.  Flu shot today .  Follow up Dr. Elsworth Soho  In 1 year and As needed

## 2016-07-29 NOTE — Progress Notes (Signed)
Subjective:    Patient ID: Jenna [name], female    DOB: July 26, 1941, 75 y.o.   MRN: [id number]  HPI 75 year old female followed for obstructive sleep apnea and restless leg syndrome  TEST  NPSG 2005:  AHI 17/hr, optimal pressure 11cm Auto 2014:  Optimal pressure 13cm .   07/29/2016 Follow up ; OSA  Patient returns for one-year follow-up for obstructive sleep apnea and restless leg syndrome Patient says she is doing well on her C Pap. She wears every night.for about 7-8 hrs  Feels rested. Unable to get download today .   On  Requip 0.5mg  daily . Has chronic back issues. Going to PT.  She denies any chest pain, orthopnea, PND, nausea, vomiting or diarrhea    Past Medical History:  Diagnosis Date  . Cancer ([place])    melanoma  . Fatigue   . Hypertension   . Hypothyroidism   . Mild aortic stenosis   . OSA (obstructive sleep apnea)   . Palpitations   . RLS (restless legs syndrome)    Current Outpatient Prescriptions on File Prior to Visit  Medication Sig Dispense Refill  . acarbose (PRECOSE) 25 MG tablet Take 25 mg by mouth 3 (three) times daily.      [name] acetaminophen (TYLENOL) 500 MG tablet Take 500-1,000 mg by mouth every 6 (six) hours as needed for mild pain, moderate pain or headache.    . ALPRAZolam (XANAX) 0.5 MG tablet Take 1 tablet by mouth 2 (two) times daily.    [name] aspirin 81 MG tablet Take 81 mg by mouth daily.      [name] atorvastatin (LIPITOR) 40 MG tablet TAKE 1 TABLET (40 MG TOTAL) BY MOUTH DAILY. 30 tablet 8  . buPROPion (WELLBUTRIN XL) 150 MG 24 hr tablet TAKE ONE TABLET (150 MG TOTAL) BY MOUTH TWICE DAILY    . Calcium Carbonate-Vitamin D (CALCIUM + D PO) Take 600 mg by mouth 2 (two) times daily.     [name] CHERRY PO Take by mouth. Drink 1/2 cup before breakfast and 1/2 cup before supper    . diltiazem (CARDIZEM CD) 120 MG 24 hr capsule TAKE ONE CAPSULE BY MOUTH EVERY DAY 30 capsule 10  . Ginger, Zingiber officinalis, (GINGER PO) Take 2 capsules by mouth daily.     [name]  levothyroxine (SYNTHROID, LEVOTHROID) 88 MCG tablet Take 88 mcg by mouth daily before breakfast.    . Magnesium 250 MG TABS Take 2 tablets by mouth daily.     . meclizine (ANTIVERT) 25 MG tablet Take 1 tablet (25 mg total) by mouth 3 (three) times daily as needed for dizziness. 30 tablet 0  . meloxicam (MOBIC) 15 MG tablet Take 15 mg by mouth every other day.     . Omega-3 Fatty Acids (FISH OIL) 1200 MG CAPS Take 1 capsule by mouth 2 (two) times daily. Take 1 at breakfast and 1 at supper    . omeprazole (PRILOSEC) 40 MG capsule TAKE ONE CAPSULE BY MOUTH EVERY DAY    . ONE TOUCH ULTRA TEST test strip Use twice daily  1  . potassium chloride SA (KLOR-CON M20) 20 MEQ tablet Take 1 tablet by mouth 2 (two) times daily.    [name] rOPINIRole (REQUIP) 0.5 MG tablet Take 2-3 daily at bedtime as needed (Patient taking differently: Take 0.5-1 mg by mouth 2 (two) times daily. 0.5 mg by mouth midday then 0.5-1 mg at bedtime) 90 tablet 3  . torsemide (DEMADEX) 5 MG tablet Take 5-15 mg by  mouth daily as needed (FOR FLUID).     . TURMERIC PO Take 1 tablet by mouth 2 (two) times daily.     . valsartan (DIOVAN) 160 MG tablet Take 160 mg by mouth daily.    . Multiple Vitamins-Minerals (PRESERVISION AREDS 2) CAPS Take 1 capsule by mouth 2 (two) times daily.     No current facility-administered medications on file prior to visit.      Review of Systems Constitutional:   No  weight loss, night sweats,  Fevers, chills, fatigue, or  lassitude.  HEENT:   No headaches,  Difficulty swallowing,  Tooth/dental problems, or  Sore throat,                No sneezing, itching, ear ache, nasal congestion, post nasal drip,   CV:  No chest pain,  Orthopnea, PND, swelling in lower extremities, anasarca, dizziness, palpitations, syncope.   GI  No heartburn, indigestion, abdominal pain, nausea, vomiting, diarrhea, change in bowel habits, loss of appetite, bloody stools.   Resp: No shortness of breath with exertion or at rest.  No  excess mucus, no productive cough,  No non-productive cough,  No coughing up of blood.  No change in color of mucus.  No wheezing.  No chest wall deformity  Skin: no rash or lesions.  GU: no dysuria, change in color of urine, no urgency or frequency.  No flank pain, no hematuria   MS:  No joint pain or swelling.  No decreased range of motion.  No back pain.  Psych:  No change in mood or affect. No depression or anxiety.  No memory loss.         Objective:   Physical Exam Vitals:   07/29/16 1440  BP: 118/76  Pulse: 77  Temp: 97.7 F (36.5 C)  TempSrc: Oral  SpO2: 95%  Weight: 213 lb (96.6 kg)  Height: 5\' 6"  (1.676 m)  Body mass index is 34.38 kg/m.   GEN: A/Ox3; pleasant , NAD, obese    HEENT:  [place]/AT,  EACs-clear, TMs-wnl, NOSE-clear, THROAT-clear, no lesions, no postnasal drip or exudate noted. Class 2-3 MP airway   NECK:  Supple w/ fair ROM; no JVD; normal carotid impulses w/o bruits; no thyromegaly or nodules palpated; no lymphadenopathy.    RESP  Clear  P & A; w/o, wheezes/ rales/ or rhonchi. no accessory muscle use, no dullness to percussion  CARD:  RRR, no m/r/g  , no peripheral edema, pulses intact, no cyanosis or clubbing.  GI:   Soft & nt; nml bowel sounds; no organomegaly or masses detected.   Musco: Warm bil, no deformities or joint swelling noted.   Neuro: alert, no focal deficits noted.    Skin: Warm, no lesions or rashes  Johan Antonacci NP-C  [facility]  07/29/2016        Assessment & Plan:

## 2016-07-29 NOTE — Assessment & Plan Note (Signed)
Controlled on Requip   Plan  Patient Instructions  Continue on CPAP At bedtime  .  Remain active, work on weight loss.  Do not drive if sleepy.  Continue on Requip.  Flu shot today .  Follow up Dr. Elsworth Soho  In 1 year and As needed

## 2016-10-18 ENCOUNTER — Other Ambulatory Visit: Payer: Self-pay | Admitting: Internal Medicine

## 2016-11-15 ENCOUNTER — Encounter (HOSPITAL_COMMUNITY): Payer: Self-pay | Admitting: General Practice

## 2016-11-15 ENCOUNTER — Encounter: Payer: Self-pay | Admitting: Cardiovascular Disease

## 2016-11-15 ENCOUNTER — Telehealth: Payer: Self-pay | Admitting: Internal Medicine

## 2016-11-15 ENCOUNTER — Other Ambulatory Visit: Payer: Self-pay

## 2016-11-15 ENCOUNTER — Inpatient Hospital Stay (HOSPITAL_COMMUNITY)
Admission: AD | Admit: 2016-11-15 | Discharge: 2016-11-20 | DRG: 310 | Disposition: A | Discharge status: Home / Self Care | Payer: Medicare Other | Source: Ambulatory Visit | Attending: Cardiovascular Disease | Admitting: Cardiovascular Disease

## 2016-11-15 ENCOUNTER — Ambulatory Visit (HOSPITAL_COMMUNITY): Payer: Medicare Other | Admitting: Cardiovascular Disease

## 2016-11-15 VITALS — BP 100/80 | HR 146 | Wt 216.0 lb

## 2016-11-15 DIAGNOSIS — Z79899 Other long term (current) drug therapy: Secondary | ICD-10-CM | POA: Diagnosis not present

## 2016-11-15 DIAGNOSIS — G2581 Restless legs syndrome: Secondary | ICD-10-CM | POA: Diagnosis present

## 2016-11-15 DIAGNOSIS — I4891 Unspecified atrial fibrillation: Secondary | ICD-10-CM | POA: Diagnosis not present

## 2016-11-15 DIAGNOSIS — T461X5A Adverse effect of calcium-channel blockers, initial encounter: Secondary | ICD-10-CM | POA: Diagnosis not present

## 2016-11-15 DIAGNOSIS — I952 Hypotension due to drugs: Secondary | ICD-10-CM | POA: Diagnosis not present

## 2016-11-15 DIAGNOSIS — I4892 Unspecified atrial flutter: Secondary | ICD-10-CM | POA: Diagnosis present

## 2016-11-15 DIAGNOSIS — I10 Essential (primary) hypertension: Secondary | ICD-10-CM | POA: Diagnosis present

## 2016-11-15 DIAGNOSIS — I350 Nonrheumatic aortic (valve) stenosis: Secondary | ICD-10-CM

## 2016-11-15 DIAGNOSIS — Z7982 Long term (current) use of aspirin: Secondary | ICD-10-CM

## 2016-11-15 DIAGNOSIS — I481 Persistent atrial fibrillation: Principal | ICD-10-CM | POA: Diagnosis present

## 2016-11-15 DIAGNOSIS — I471 Supraventricular tachycardia: Secondary | ICD-10-CM | POA: Diagnosis not present

## 2016-11-15 DIAGNOSIS — E039 Hypothyroidism, unspecified: Secondary | ICD-10-CM

## 2016-11-15 DIAGNOSIS — G4733 Obstructive sleep apnea (adult) (pediatric): Secondary | ICD-10-CM | POA: Diagnosis present

## 2016-11-15 DIAGNOSIS — I340 Nonrheumatic mitral (valve) insufficiency: Secondary | ICD-10-CM | POA: Diagnosis not present

## 2016-11-15 DIAGNOSIS — I429 Cardiomyopathy, unspecified: Secondary | ICD-10-CM | POA: Diagnosis present

## 2016-11-15 DIAGNOSIS — K219 Gastro-esophageal reflux disease without esophagitis: Secondary | ICD-10-CM | POA: Diagnosis present

## 2016-11-15 DIAGNOSIS — I4819 Other persistent atrial fibrillation: Secondary | ICD-10-CM | POA: Diagnosis present

## 2016-11-15 DIAGNOSIS — Z833 Family history of diabetes mellitus: Secondary | ICD-10-CM | POA: Diagnosis not present

## 2016-11-15 DIAGNOSIS — I480 Paroxysmal atrial fibrillation: Secondary | ICD-10-CM | POA: Diagnosis not present

## 2016-11-15 DIAGNOSIS — Z85820 Personal history of malignant melanoma of skin: Secondary | ICD-10-CM | POA: Diagnosis not present

## 2016-11-15 HISTORY — DX: Major depressive disorder, single episode, unspecified: F32.9

## 2016-11-15 HISTORY — DX: Low back pain, unspecified: M54.50

## 2016-11-15 HISTORY — DX: Type 2 diabetes mellitus without complications: E11.9

## 2016-11-15 HISTORY — DX: Obstructive sleep apnea (adult) (pediatric): G47.33

## 2016-11-15 HISTORY — DX: Pure hypercholesterolemia, unspecified: E78.00

## 2016-11-15 HISTORY — DX: Malignant melanoma of left upper limb, including shoulder: C43.62

## 2016-11-15 HISTORY — DX: Anxiety disorder, unspecified: F41.9

## 2016-11-15 HISTORY — DX: Unspecified osteoarthritis, unspecified site: M19.90

## 2016-11-15 HISTORY — DX: Low back pain: M54.5

## 2016-11-15 HISTORY — DX: Other persistent atrial fibrillation: I48.19

## 2016-11-15 HISTORY — DX: Gastro-esophageal reflux disease without esophagitis: K21.9

## 2016-11-15 HISTORY — DX: Other chronic pain: G89.29

## 2016-11-15 HISTORY — DX: Dependence on other enabling machines and devices: Z99.89

## 2016-11-15 HISTORY — DX: Depression, unspecified: F32.A

## 2016-11-15 LAB — GLUCOSE, CAPILLARY
Glucose-Capillary: 107 mg/dL — ABNORMAL HIGH (ref 65–99)
Glucose-Capillary: 127 mg/dL — ABNORMAL HIGH (ref 65–99)

## 2016-11-15 LAB — T4, FREE: Free T4: 1.22 ng/dL — ABNORMAL HIGH (ref 0.61–1.12)

## 2016-11-15 LAB — TSH: TSH: 2.652 u[IU]/mL (ref 0.350–4.500)

## 2016-11-15 MED ORDER — CALCIUM CITRATE-VITAMIN D 315-200 MG-UNIT PO TABS: 1.0000 | ORAL_TABLET | Freq: Two times a day (BID) | ORAL | Status: DC

## 2016-11-15 MED ORDER — ALPRAZOLAM 0.25 MG PO TABS: 0.2500 mg | ORAL_TABLET | Freq: Two times a day (BID) | ORAL | Status: DC | PRN

## 2016-11-15 MED ORDER — ALPRAZOLAM 0.5 MG PO TABS
0.5000 mg | ORAL_TABLET | Freq: Two times a day (BID) | ORAL | Status: DC
  Administered 2016-11-15 – 2016-11-20 (×9): 0.5 mg via ORAL
  Filled 2016-11-15 (×9): qty 1

## 2016-11-15 MED ORDER — SODIUM CHLORIDE 0.9% FLUSH: 3.0000 mL | INTRAVENOUS | Status: DC | PRN

## 2016-11-15 MED ORDER — TORSEMIDE 5 MG PO TABS: 5.0000 mg | ORAL_TABLET | Freq: Every day | ORAL | Status: DC | PRN

## 2016-11-15 MED ORDER — CARVEDILOL 6.25 MG PO TABS: 18.7500 mg | ORAL_TABLET | Freq: Two times a day (BID) | ORAL | Status: DC

## 2016-11-15 MED ORDER — ATORVASTATIN CALCIUM 40 MG PO TABS
40.0000 mg | ORAL_TABLET | Freq: Every day | ORAL | Status: DC
  Administered 2016-11-15 – 2016-11-19 (×5): 40 mg via ORAL
  Filled 2016-11-15 (×5): qty 1

## 2016-11-15 MED ORDER — CARVEDILOL 12.5 MG PO TABS: 12.5000 mg | ORAL_TABLET | Freq: Two times a day (BID) | ORAL | Status: DC

## 2016-11-15 MED ORDER — APIXABAN 5 MG PO TABS
5.0000 mg | ORAL_TABLET | Freq: Two times a day (BID) | ORAL | Status: DC
  Administered 2016-11-15 – 2016-11-20 (×10): 5 mg via ORAL
  Filled 2016-11-15 (×10): qty 1

## 2016-11-15 MED ORDER — DILTIAZEM HCL 100 MG IV SOLR
5.0000 mg/h | INTRAVENOUS | Status: DC
Start: 2016-11-15 — End: 2016-11-19
  Administered 2016-11-15: 5 mg/h via INTRAVENOUS
  Filled 2016-11-15 (×2): qty 100

## 2016-11-15 MED ORDER — SODIUM CHLORIDE 0.9 % IV SOLN: 250.0000 mL | INTRAVENOUS | Status: DC | PRN

## 2016-11-15 MED ORDER — POTASSIUM CHLORIDE CRYS ER 20 MEQ PO TBCR
20.0000 meq | EXTENDED_RELEASE_TABLET | Freq: Two times a day (BID) | ORAL | Status: DC
  Administered 2016-11-15 – 2016-11-20 (×10): 20 meq via ORAL
  Filled 2016-11-15 (×10): qty 1

## 2016-11-15 MED ORDER — MAGNESIUM 200 MG PO TABS
2.0000 | ORAL_TABLET | Freq: Every day | ORAL | Status: DC
  Filled 2016-11-15: qty 2

## 2016-11-15 MED ORDER — SODIUM CHLORIDE 0.9% FLUSH
3.0000 mL | Freq: Two times a day (BID) | INTRAVENOUS | Status: DC
  Administered 2016-11-16 – 2016-11-19 (×7): 3 mL via INTRAVENOUS

## 2016-11-15 MED ORDER — BUPROPION HCL ER (XL) 150 MG PO TB24
150.0000 mg | ORAL_TABLET | Freq: Two times a day (BID) | ORAL | Status: DC
  Administered 2016-11-15 – 2016-11-20 (×10): 150 mg via ORAL
  Filled 2016-11-15 (×12): qty 1

## 2016-11-15 MED ORDER — PROSIGHT PO TABS
1.0000 | ORAL_TABLET | Freq: Two times a day (BID) | ORAL | Status: DC
  Administered 2016-11-15 – 2016-11-20 (×10): 1 via ORAL
  Filled 2016-11-15 (×10): qty 1

## 2016-11-15 MED ORDER — METOPROLOL TARTRATE 25 MG PO TABS
25.0000 mg | ORAL_TABLET | Freq: Two times a day (BID) | ORAL | Status: DC
  Administered 2016-11-15 – 2016-11-19 (×8): 25 mg via ORAL
  Filled 2016-11-15 (×8): qty 1

## 2016-11-15 MED ORDER — NITROGLYCERIN 0.4 MG SL SUBL: 0.4000 mg | SUBLINGUAL_TABLET | SUBLINGUAL | Status: DC | PRN

## 2016-11-15 MED ORDER — ROPINIROLE HCL 1 MG PO TABS
0.5000 mg | ORAL_TABLET | Freq: Every day | ORAL | Status: DC
  Administered 2016-11-15 – 2016-11-19 (×5): 1 mg via ORAL
  Filled 2016-11-15 (×5): qty 1

## 2016-11-15 MED ORDER — TRAMADOL HCL 50 MG PO TABS
50.0000 mg | ORAL_TABLET | Freq: Every day | ORAL | Status: DC
  Administered 2016-11-15 – 2016-11-20 (×4): 50 mg via ORAL
  Filled 2016-11-15 (×5): qty 1

## 2016-11-15 MED ORDER — DILTIAZEM LOAD VIA INFUSION
10.0000 mg | Freq: Once | INTRAVENOUS | Status: AC
  Administered 2016-11-15: 10 mg via INTRAVENOUS
  Filled 2016-11-15: qty 10

## 2016-11-15 MED ORDER — ROPINIROLE HCL 1 MG PO TABS
0.5000 mg | ORAL_TABLET | Freq: Every day | ORAL | Status: DC
  Administered 2016-11-16 – 2016-11-20 (×5): 0.5 mg via ORAL
  Filled 2016-11-15 (×5): qty 1

## 2016-11-15 MED ORDER — ACARBOSE 25 MG PO TABS
25.0000 mg | ORAL_TABLET | Freq: Three times a day (TID) | ORAL | Status: DC
  Administered 2016-11-15 – 2016-11-20 (×13): 25 mg via ORAL
  Filled 2016-11-15 (×16): qty 1

## 2016-11-15 MED ORDER — CALCIUM CARBONATE-VITAMIN D 500-200 MG-UNIT PO TABS
1.0000 | ORAL_TABLET | Freq: Two times a day (BID) | ORAL | Status: DC
  Administered 2016-11-15 – 2016-11-20 (×10): 1 via ORAL
  Filled 2016-11-15 (×10): qty 1

## 2016-11-15 MED ORDER — ACETAMINOPHEN 325 MG PO TABS
650.0000 mg | ORAL_TABLET | ORAL | Status: DC | PRN
  Administered 2016-11-16 – 2016-11-18 (×4): 650 mg via ORAL
  Filled 2016-11-15 (×4): qty 2

## 2016-11-15 MED ORDER — ONDANSETRON HCL 4 MG/2ML IJ SOLN
4.0000 mg | Freq: Four times a day (QID) | INTRAMUSCULAR | Status: DC | PRN
  Administered 2016-11-16 – 2016-11-18 (×5): 4 mg via INTRAVENOUS
  Filled 2016-11-15 (×6): qty 2

## 2016-11-15 MED ORDER — ZOLPIDEM TARTRATE 5 MG PO TABS: 5.0000 mg | ORAL_TABLET | Freq: Every evening | ORAL | Status: DC | PRN

## 2016-11-15 MED ORDER — PRESERVISION AREDS 2 PO CAPS: 1.0000 | ORAL_CAPSULE | Freq: Two times a day (BID) | ORAL | Status: DC

## 2016-11-15 MED ORDER — OMEGA-3-ACID ETHYL ESTERS 1 G PO CAPS
1.0000 g | ORAL_CAPSULE | Freq: Two times a day (BID) | ORAL | Status: DC
  Administered 2016-11-15 – 2016-11-20 (×10): 1 g via ORAL
  Filled 2016-11-15 (×10): qty 1

## 2016-11-15 MED ORDER — LEVOTHYROXINE SODIUM 88 MCG PO TABS
88.0000 ug | ORAL_TABLET | Freq: Every day | ORAL | Status: DC
  Administered 2016-11-16 – 2016-11-20 (×5): 88 ug via ORAL
  Filled 2016-11-15 (×5): qty 1

## 2016-11-15 MED ORDER — IRBESARTAN 75 MG PO TABS: 37.5000 mg | ORAL_TABLET | Freq: Every day | ORAL | Status: DC

## 2016-11-15 MED ORDER — INSULIN ASPART 100 UNIT/ML ~~LOC~~ SOLN: 0.0000 [IU] | Freq: Every day | SUBCUTANEOUS | Status: DC

## 2016-11-15 MED ORDER — ASPIRIN EC 81 MG PO TBEC
81.0000 mg | DELAYED_RELEASE_TABLET | Freq: Every day | ORAL | Status: DC
  Administered 2016-11-15 – 2016-11-20 (×6): 81 mg via ORAL
  Filled 2016-11-15 (×6): qty 1

## 2016-11-15 MED ORDER — INSULIN ASPART 100 UNIT/ML ~~LOC~~ SOLN: 0.0000 [IU] | Freq: Three times a day (TID) | SUBCUTANEOUS | Status: DC

## 2016-11-15 MED ORDER — PANTOPRAZOLE SODIUM 40 MG PO TBEC
40.0000 mg | DELAYED_RELEASE_TABLET | Freq: Every day | ORAL | Status: DC
  Administered 2016-11-16 – 2016-11-20 (×5): 40 mg via ORAL
  Filled 2016-11-15 (×5): qty 1

## 2016-11-15 NOTE — Progress Notes (Signed)
Spoke with Dr Sallyanne Kuster.  Change Coreg to metoprolol, ck echo, add Cardizem drip.  Lenoard Aden 11/15/2016 5:50 PM Beeper [phone]

## 2016-11-15 NOTE — Progress Notes (Signed)
Patient arrived to 2W room 26.  Telemetry monitor applied and CCMD notified.  Patient oriented to unit and room to include call light and phone.  Will continue to monitor.

## 2016-11-15 NOTE — Patient Instructions (Signed)
You are being admitted to [facility] have a bed on [facility].  Please arrive to the [facility] (new entrance to [place]. There is a Probation officer.  Ask the volunteer at the volunteer desk to point you to the admissions office.

## 2016-11-15 NOTE — Progress Notes (Signed)
Cardiology Office Note    Date:  11/15/2016   ID:  Jenna [name], DOB Jun 19, 1941, MRN [id number]  PCP:  Chesley Noon, MD  Cardiologist:  K. Mali Hilty, MD;  Sanda Klein, MD   Chief Complaint  Patient presents with  . Follow-up    PT C/O HEART FLUTTERING AND HIGH HEART RATE, NAUSEAU AND HEARTBURN    History of Present Illness:  Jenna [name] is a 76 y.o. female with a history of recurrent paroxysmal atrial fibrillation, presenting today with roughly 1 week of palpitations and one month of fatigue. She has not had syncope or frank dizziness, but feels "funny" when she stands up and has mild nausea denies dyspnea or angina. She does not have significant lower extremity edema and denies focal neurological complications or any recent bleeding problems. She is not taking anticoagulants but is on aspirin.  In Dr. Fayrene Fearing office yesterday her heart rate was 164 bpm and blood pressure 110/77. In our office today her heart rate is 146 and blood pressure is only 100/80.  She has mild aortic stenosis, systemic hypertension, obstructive sleep apnea, restless leg syndrome and treated hypothyroidism(most recent TSH normal in April 2017). Treatment with diltiazem led to weight gain and she is now on carvedilol 12.5 mg twice a day, which she only just started yesterday. In 2013 she had a normal nuclear stress test with normal left ventricular ejection fraction. The echo also showed normal left ventricular function and the absence of LVH or diastolic dysfunction, but borderline left atrial enlargement (LA volume index 28 mL/meter squared). She had mild aortic valve stenosis with a mean gradient of 12 mmHg.  She seems to be in persistent atrial fibrillation rapid ventricular response, possibly for as long as a month now. There is no room for additional rate control medications due to her borderline systolic blood pressure. I have advised hospitalization.  Past Medical History:  Diagnosis Date  . Cancer  ([place])    melanoma  . Fatigue   . Hypertension   . Hypothyroidism   . Mild aortic stenosis   . OSA (obstructive sleep apnea)   . Palpitations   . RLS (restless legs syndrome)     Past Surgical History:  Procedure Laterality Date  . ABDOMINAL HYSTERECTOMY    . CATARACT EXTRACTION    . LEXISCAN MYOVIEW  05/13/2012   No ECG changes. EKG negative for ischemia. No significant ischemia demonstrated.  . TRANSTHORACIC ECHOCARDIOGRAM  02/18/2012   EF >55%, mild aortic stenosis    Current Medications: Outpatient Medications Prior to Visit  Medication Sig Dispense Refill  . acarbose (PRECOSE) 25 MG tablet Take 25 mg by mouth 3 (three) times daily.      [name] acetaminophen (TYLENOL) 500 MG tablet Take 500-1,000 mg by mouth every 6 (six) hours as needed for mild pain, moderate pain or headache.    . ALPRAZolam (XANAX) 0.5 MG tablet Take 1 tablet by mouth 2 (two) times daily.    [name] aspirin 81 MG tablet Take 81 mg by mouth daily.      [name] atorvastatin (LIPITOR) 40 MG tablet TAKE 1 TABLET (40 MG TOTAL) BY MOUTH DAILY. 30 tablet 11  . buPROPion (WELLBUTRIN XL) 150 MG 24 hr tablet TAKE ONE TABLET (150 MG TOTAL) BY MOUTH TWICE DAILY    . Calcium Carbonate-Vitamin D (CALCIUM + D PO) Take 600 mg by mouth 2 (two) times daily.     [name] CHERRY PO Take by mouth. Drink 1/2 cup before breakfast and  1/2 cup before supper    . Ginger, Zingiber officinalis, (GINGER PO) Take 2 capsules by mouth daily.     [name] levothyroxine (SYNTHROID, LEVOTHROID) 88 MCG tablet Take 88 mcg by mouth daily before breakfast.    . Magnesium 250 MG TABS Take 2 tablets by mouth daily.     . meclizine (ANTIVERT) 25 MG tablet Take 1 tablet (25 mg total) by mouth 3 (three) times daily as needed for dizziness. 30 tablet 0  . meloxicam (MOBIC) 15 MG tablet Take 15 mg by mouth every other day.     . Multiple Vitamins-Minerals (PRESERVISION AREDS 2) CAPS Take 1 capsule by mouth 2 (two) times daily.    . Omega-3 Fatty Acids (FISH OIL) 1200 MG CAPS Take 1  capsule by mouth 2 (two) times daily. Take 1 at breakfast and 1 at supper    . omeprazole (PRILOSEC) 40 MG capsule TAKE ONE CAPSULE BY MOUTH EVERY DAY    . ONE TOUCH ULTRA TEST test strip Use twice daily  1  . potassium chloride SA (KLOR-CON M20) 20 MEQ tablet Take 1 tablet by mouth 2 (two) times daily.    [name] rOPINIRole (REQUIP) 0.5 MG tablet Take 2-3 daily at bedtime as needed (Patient taking differently: Take 0.5-1 mg by mouth 2 (two) times daily. 0.5 mg by mouth midday then 0.5-1 mg at bedtime) 90 tablet 3  . torsemide (DEMADEX) 5 MG tablet Take 5-15 mg by mouth daily as needed (FOR FLUID).     . TURMERIC PO Take 1 tablet by mouth 2 (two) times daily.     . valsartan (DIOVAN) 160 MG tablet Take 160 mg by mouth daily.     No facility-administered medications prior to visit.      Allergies:   Amoxicillin; Crestor [rosuvastatin]; and Lovastatin   Social History   Social History  . Marital status: Married    Spouse name: N/A  . Number of children: N/A  . Years of education: N/A   Social History Main Topics  . Smoking status: Never Smoker  . Smokeless tobacco: Never Used  . Alcohol use No  . Drug use: No  . Sexual activity: Not on file   Other Topics Concern  . Not on file   Social History Narrative  . No narrative on file     Family History:  The patient's family history includes Arrhythmia in her sister; Cancer in her paternal grandfather; Diabetes in her mother; Healthy in her brother and daughter; Heart failure in her father; Hyperlipidemia in her sister and son; Hypertension in her mother, sister, and son; Stroke in her paternal grandmother.   ROS:   Please see the history of present illness.    ROS All other systems reviewed and are negative.   PHYSICAL EXAM:   VS:  BP 100/80 (BP Location: Right Arm, Patient Position: Sitting, Cuff Size: Normal)   Pulse (!) 146   Wt 98 kg (216 lb)   BMI 34.86 kg/m    GEN: Well nourished, well developed, in no acute distress Looks  tired HEENT: normal  Neck: no JVD, carotid bruits, or masses Cardiac: Rapid irregular rhythm, grade [id number] systolic ejection murmur is heard intermittently, no diastolic murmurs, rubs, or gallops, or ankle edema edema  Respiratory:  clear to auscultation bilaterally, normal work of breathing GI: soft, nontender, nondistended, + BS MS: no deformity or atrophy  Skin: warm and dry, no rash Neuro:  Alert and Oriented x 3, Strength and sensation are intact Psych: euthymic  mood, full affect  Wt Readings from Last 3 Encounters:  11/15/16 98 kg (216 lb)  07/29/16 96.6 kg (213 lb)  07/08/16 95.9 kg (211 lb 6.4 oz)      Studies/Labs Reviewed:   EKG:  EKG is ordered today.  The ekg ordered today demonstrates Atrial fibrillation with rapid ventricular response, generalized low voltage, left axis deviation, poor R-wave progression almost meeting criteria for left anterior fascicular block  Recent Labs: 03/26/2016: ALT 22; BUN 21; Creatinine, Ser 1.10; Hemoglobin 13.3; Platelets 363; Potassium 3.6; Sodium 136   Lipid Panel No results found for: CHOL, TRIG, HDL, CHOLHDL, VLDL, LDLCALC, LDLDIRECT  Additional studies/ records that were reviewed today include:  Notes from Dr. Fayrene Fearing office in care and everywhere from yesterday    ASSESSMENT:    No diagnosis found.   PLAN:  In order of problems listed above:  1. Persistent atrial flutter fibrillation rapid ventricular response: She appears ill and her blood pressure does not allow additional rate control medications without risk of hypotension and syncope. I would advise hospitalization to avoid syncope and/or heart failure. Also needs to start treatment with anticoagulants. Prefer direct oral anticoagulants such as Eliquis. Discussed TEE guided cardioversion after she has been on anticoagulants for a couple of days at least. Logistically, it is unlikely the cardioversion will be possible until Tuesday, unless her as a cancellation on the  Monday schedule. She understands these considerations. 2. HTN: Her blood pressure is low. Will prefer increasing rate control medications and reducing the dose of valsartan to allow better rate control. 3. OSA: Together with her obesity this probably plays a role in her atrial fibrillation. In the long run compliance with CPAP and weight loss will reduce the burden of atrial fibrillation 4. Hypothyroidism: Need to get an updated thyroid profile. 5. AS: This was mild by the most recent echo and is unlikely to be hemodynamically important in the current illness    Medication Adjustments/Labs and Tests Ordered: Current medicines are reviewed at length with the patient today.  Concerns regarding medicines are outlined above.  Medication changes, Labs and Tests ordered today are listed in the Patient Instructions below. There are no Patient Instructions on file for this visit.   Signed, Sanda Klein, MD  11/15/2016 3:20 PM    [facility] [place], [place], [place]  [address] Phone: [phone]; Fax: [phone]

## 2016-11-15 NOTE — H&P (Signed)
Cardiology Office Note    Date:  11/15/2016   ID:  Jenna [name], DOB 07/05/1941, MRN [id number]  PCP:  Chesley Noon, MD      Cardiologist:  K. Mali Hilty, MD;  Sanda Klein, MD       Chief Complaint  Patient presents with  . Follow-up    PT C/O HEART FLUTTERING AND HIGH HEART RATE, NAUSEAU AND HEARTBURN    History of Present Illness:  Jenna [name] is a 76 y.o. female with a history of recurrent paroxysmal atrial fibrillation, presenting today with roughly 1 week of palpitations and one month of fatigue. She has not had syncope or frank dizziness, but feels "funny" when she stands up and has mild nausea denies dyspnea or angina. She does not have significant lower extremity edema and denies focal neurological complications or any recent bleeding problems. She is not taking anticoagulants but is on aspirin.  In Dr. Fayrene Fearing office yesterday her heart rate was 164 bpm and blood pressure 110/77. In our office today her heart rate is 146 and blood pressure is only 100/80.  She has mild aortic stenosis, systemic hypertension, obstructive sleep apnea, restless leg syndrome and treated hypothyroidism(most recent TSH normal in April 2017). Treatment with diltiazem led to weight gain and she is now on carvedilol 12.5 mg twice a day, which she only just started yesterday. In 2013 she had a normal nuclear stress test with normal left ventricular ejection fraction. The echo also showed normal left ventricular function and the absence of LVH or diastolic dysfunction, but borderline left atrial enlargement (LA volume index 28 mL/meter squared). She had mild aortic valve stenosis with a mean gradient of 12 mmHg.  She seems to be in persistent atrial fibrillation rapid ventricular response, possibly for as long as a month now. There is no room for additional rate control medications due to her borderline systolic blood pressure. I have advised hospitalization.      Past Medical History:    Diagnosis Date  . Cancer ([place])    melanoma  . Fatigue   . Hypertension   . Hypothyroidism   . Mild aortic stenosis   . OSA (obstructive sleep apnea)   . Palpitations   . RLS (restless legs syndrome)     Past Surgical History:  Procedure Laterality Date  . ABDOMINAL HYSTERECTOMY    . CATARACT EXTRACTION    . LEXISCAN MYOVIEW  05/13/2012   No ECG changes. EKG negative for ischemia. No significant ischemia demonstrated.  . TRANSTHORACIC ECHOCARDIOGRAM  02/18/2012   EF >55%, mild aortic stenosis    Current Medications:       Outpatient Medications Prior to Visit  Medication Sig Dispense Refill  . acarbose (PRECOSE) 25 MG tablet Take 25 mg by mouth 3 (three) times daily.      [name] acetaminophen (TYLENOL) 500 MG tablet Take 500-1,000 mg by mouth every 6 (six) hours as needed for mild pain, moderate pain or headache.    . ALPRAZolam (XANAX) 0.5 MG tablet Take 1 tablet by mouth 2 (two) times daily.    [name] aspirin 81 MG tablet Take 81 mg by mouth daily.      [name] atorvastatin (LIPITOR) 40 MG tablet TAKE 1 TABLET (40 MG TOTAL) BY MOUTH DAILY. 30 tablet 11  . buPROPion (WELLBUTRIN XL) 150 MG 24 hr tablet TAKE ONE TABLET (150 MG TOTAL) BY MOUTH TWICE DAILY    . Calcium Carbonate-Vitamin D (CALCIUM + D PO) Take 600 mg by mouth 2 (  two) times daily.     [name] CHERRY PO Take by mouth. Drink 1/2 cup before breakfast and 1/2 cup before supper    . Ginger, Zingiber officinalis, (GINGER PO) Take 2 capsules by mouth daily.     [name] levothyroxine (SYNTHROID, LEVOTHROID) 88 MCG tablet Take 88 mcg by mouth daily before breakfast.    . Magnesium 250 MG TABS Take 2 tablets by mouth daily.     . meclizine (ANTIVERT) 25 MG tablet Take 1 tablet (25 mg total) by mouth 3 (three) times daily as needed for dizziness. 30 tablet 0  . meloxicam (MOBIC) 15 MG tablet Take 15 mg by mouth every other day.     . Multiple Vitamins-Minerals (PRESERVISION AREDS 2) CAPS Take 1 capsule by mouth 2  (two) times daily.    . Omega-3 Fatty Acids (FISH OIL) 1200 MG CAPS Take 1 capsule by mouth 2 (two) times daily. Take 1 at breakfast and 1 at supper    . omeprazole (PRILOSEC) 40 MG capsule TAKE ONE CAPSULE BY MOUTH EVERY DAY    . ONE TOUCH ULTRA TEST test strip Use twice daily  1  . potassium chloride SA (KLOR-CON M20) 20 MEQ tablet Take 1 tablet by mouth 2 (two) times daily.    [name] rOPINIRole (REQUIP) 0.5 MG tablet Take 2-3 daily at bedtime as needed (Patient taking differently: Take 0.5-1 mg by mouth 2 (two) times daily. 0.5 mg by mouth midday then 0.5-1 mg at bedtime) 90 tablet 3  . torsemide (DEMADEX) 5 MG tablet Take 5-15 mg by mouth daily as needed (FOR FLUID).     . TURMERIC PO Take 1 tablet by mouth 2 (two) times daily.     . valsartan (DIOVAN) 160 MG tablet Take 160 mg by mouth daily.     No facility-administered medications prior to visit.      Allergies:   Amoxicillin; Crestor [rosuvastatin]; and Lovastatin   Social History        Social History  . Marital status: Married    Spouse name: N/A  . Number of children: N/A  . Years of education: N/A       Social History Main Topics  . Smoking status: Never Smoker  . Smokeless tobacco: Never Used  . Alcohol use No  . Drug use: No  . Sexual activity: Not on file       Other Topics Concern  . Not on file      Social History Narrative  . No narrative on file     Family History:  The patient's family history includes Arrhythmia in her sister; Cancer in her paternal grandfather; Diabetes in her mother; Healthy in her brother and daughter; Heart failure in her father; Hyperlipidemia in her sister and son; Hypertension in her mother, sister, and son; Stroke in her paternal grandmother.   ROS:   Please see the history of present illness.    ROS All other systems reviewed and are negative.   PHYSICAL EXAM:   VS:  BP 100/80 (BP Location: Right Arm, Patient Position: Sitting, Cuff Size: Normal)    Pulse (!) 146   Wt 98 kg (216 lb)   BMI 34.86 kg/m    GEN: Well nourished, well developed, in no acute distress Looks tired HEENT: normal  Neck: no JVD, carotid bruits, or masses Cardiac: Rapid irregular rhythm, grade [id number] systolic ejection murmur is heard intermittently, no diastolic murmurs, rubs, or gallops, or ankle edema edema  Respiratory:  clear to auscultation bilaterally, normal  work of breathing GI: soft, nontender, nondistended, + BS MS: no deformity or atrophy  Skin: warm and dry, no rash Neuro:  Alert and Oriented x 3, Strength and sensation are intact Psych: euthymic mood, full affect     Wt Readings from Last 3 Encounters:  11/15/16 98 kg (216 lb)  07/29/16 96.6 kg (213 lb)  07/08/16 95.9 kg (211 lb 6.4 oz)      Studies/Labs Reviewed:   EKG:  EKG is ordered today.  The ekg ordered today demonstrates Atrial fibrillation with rapid ventricular response, generalized low voltage, left axis deviation, poor R-wave progression almost meeting criteria for left anterior fascicular block  Recent Labs: 03/26/2016: ALT 22; BUN 21; Creatinine, Ser 1.10; Hemoglobin 13.3; Platelets 363; Potassium 3.6; Sodium 136   Lipid Panel Labs (Brief)  No results found for: CHOL, TRIG, HDL, CHOLHDL, VLDL, LDLCALC, LDLDIRECT    Additional studies/ records that were reviewed today include:  Notes from Dr. Fayrene Fearing office in care and everywhere from yesterday    ASSESSMENT:    No diagnosis found.   PLAN:  In order of problems listed above:  1. Persistent atrial flutter fibrillation rapid ventricular response: She appears ill and her blood pressure does not allow additional rate control medications without risk of hypotension and syncope. I would advise hospitalization to avoid syncope and/or heart failure. Also needs to start treatment with anticoagulants. Prefer direct oral anticoagulants such as Eliquis. Discussed TEE guided cardioversion after she has been on  anticoagulants for a couple of days at least. Logistically, it is unlikely the cardioversion will be possible until Tuesday, unless her as a cancellation on the Monday schedule. She understands these considerations. 2. HTN: Her blood pressure is low. Will prefer increasing rate control medications and reducing the dose of valsartan to allow better rate control. 3. OSA: Together with her obesity this probably plays a role in her atrial fibrillation. In the long run compliance with CPAP and weight loss will reduce the burden of atrial fibrillation 4. Hypothyroidism: Need to get an updated thyroid profile. 5. AS: This was mild by the most recent echo and is unlikely to be hemodynamically important in the current illness    Medication Adjustments/Labs and Tests Ordered: Current medicines are reviewed at length with the patient today.  Concerns regarding medicines are outlined above.  Medication changes, Labs and Tests ordered today are listed in the Patient Instructions below. There are no Patient Instructions on file for this visit.   Signed, Sanda Klein, MD  11/15/2016 3:20 PM    [facility] [address], [place], [place]  [address] Phone: [phone]; Fax: [phone]

## 2016-11-15 NOTE — Telephone Encounter (Signed)
Spoke with Colletta Maryland and Adonis Brook from Dr Fayrene Fearing office - requested patient to be seen today. Stated that they tried to make contact with our office twice over the last 2 days. Found no documentation of these attempts in our record.  Adonis Brook stated that patient was seen yesterday in their office, complained of fluctuating heart rate and elevated BP, overall just "feeling bad." Per Dr Melford Aase - patient to be seen today. Reviewed with [facility]. Appointment made for patient to be seen today at 2:15p. Patient notified. Returned call to [place] - left message notifying her that patient has an appointment today.

## 2016-11-15 NOTE — Progress Notes (Signed)
ANTICOAGULATION CONSULT NOTE - Initial Consult  Pharmacy Consult for Eliquis Indication: atrial fibrillation  Allergies  Allergen Reactions  . Amoxicillin Nausea Only  . Crestor [Rosuvastatin] Other (See Comments)    Joints ached  . Lovastatin Other (See Comments)    Joints hurt all over body     Patient Measurements: Height: 5\' 5"  (165.1 cm) Weight: 214 lb (97.1 kg) IBW/kg (Calculated) : 57   Vital Signs: Temp: 97.7 F (36.5 C) (02/09 1621) Temp Source: Oral (02/09 1621) BP: 113/73 (02/09 1621) Pulse Rate: 139 (02/09 1621)  Labs: No results for input(s): HGB, HCT, PLT, APTT, LABPROT, INR, HEPARINUNFRC, HEPRLOWMOCWT, CREATININE, CKTOTAL, CKMB, TROPONINI in the last 72 hours.  CrCl cannot be calculated (Patient's most recent lab result is older than the maximum 21 days allowed.).   Medical History: Past Medical History:  Diagnosis Date  . Cancer ([place])    melanoma  . Fatigue   . Hypertension   . Hypothyroidism   . Mild aortic stenosis   . OSA (obstructive sleep apnea)   . Palpitations   . RLS (restless legs syndrome)      Assessment: Pt admitted with palpitations and fatigue. Found to be in AFib. To start Eliquis. CBC wnl, SCr 1.1. Was not on any anticoagulation prior to admission.   Goal of Therapy:  Monitor platelets by anticoagulation protocol: Yes    Plan:  -Eliquis 5 mg po bid -Monitor s/sx bleeding  -F/u plans for cardioversion   Harvel Quale 11/15/2016,5:28 PM

## 2016-11-16 ENCOUNTER — Inpatient Hospital Stay (HOSPITAL_COMMUNITY): Payer: Medicare Other

## 2016-11-16 DIAGNOSIS — I350 Nonrheumatic aortic (valve) stenosis: Secondary | ICD-10-CM

## 2016-11-16 DIAGNOSIS — I481 Persistent atrial fibrillation: Principal | ICD-10-CM

## 2016-11-16 LAB — COMPREHENSIVE METABOLIC PANEL
ALT: 79 U/L — ABNORMAL HIGH (ref 14–54)
AST: 64 U/L — ABNORMAL HIGH (ref 15–41)
Albumin: 3.4 g/dL — ABNORMAL LOW (ref 3.5–5.0)
Alkaline Phosphatase: 96 U/L (ref 38–126)
Anion gap: 12 (ref 5–15)
BUN: 26 mg/dL — ABNORMAL HIGH (ref 6–20)
CO2: 28 mmol/L (ref 22–32)
Calcium: 9.6 mg/dL (ref 8.9–10.3)
Chloride: 97 mmol/L — ABNORMAL LOW (ref 101–111)
Creatinine, Ser: 1.06 mg/dL — ABNORMAL HIGH (ref 0.44–1.00)
GFR calc Af Amer: 58 mL/min — ABNORMAL LOW (ref >60)
GFR calc non Af Amer: 50 mL/min — ABNORMAL LOW (ref >60)
Glucose, Bld: 135 mg/dL — ABNORMAL HIGH (ref 65–99)
Potassium: 4.8 mmol/L (ref 3.5–5.1)
Sodium: 137 mmol/L (ref 135–145)
Total Bilirubin: 0.7 mg/dL (ref 0.3–1.2)
Total Protein: 6.2 g/dL — ABNORMAL LOW (ref 6.5–8.1)

## 2016-11-16 LAB — ECHOCARDIOGRAM COMPLETE
AO mean calculated velocity dopler: 152 cm/s
AV Area VTI index: 0.35 cm2/m2
AV Area VTI: 0.7 cm2
AV Area mean vel: 0.67 cm2
AV Mean grad: 11 mmHg
AV Peak grad: 18 mmHg
AV VEL mean LVOT/AV: 0.26
AV area mean vel ind: 0.33 cm2/m2
AV peak Index: 0.34
AV pk vel: 211 cm/s
AV vel: 0.71
Ao pk vel: 0.27 m/s
FS: 16 % — AB (ref 28–44)
Height: 65 in
IVS/LV PW RATIO, ED: 0.9
LA ID, A-P, ES: 37 mm
LA diam end sys: 37 mm
LA diam index: 1.81 cm/m2
LA vol A4C: 62.6 ml
LA vol index: 33.5 mL/m2
LA vol: 68.3 mL
LV PW d: 8.93 mm — AB (ref 0.6–1.1)
LVOT SV: 29 mL
LVOT VTI: 11.3 cm
LVOT area: 2.54 cm2
LVOT diameter: 18 mm
LVOT peak VTI: 0.28 cm
LVOT peak vel: 57.9 cm/s
TAPSE: 10.1 mm
VTI: 40.5 cm
Valve area index: 0.35
Valve area: 0.71 cm2
Weight: 3459.2 oz

## 2016-11-16 LAB — GLUCOSE, CAPILLARY
Glucose-Capillary: 115 mg/dL — ABNORMAL HIGH (ref 65–99)
Glucose-Capillary: 113 mg/dL — ABNORMAL HIGH (ref 65–99)
Glucose-Capillary: 71 mg/dL (ref 65–99)
Glucose-Capillary: 132 mg/dL — ABNORMAL HIGH (ref 65–99)
Glucose-Capillary: 124 mg/dL — ABNORMAL HIGH (ref 65–99)

## 2016-11-16 LAB — HEMOGLOBIN A1C
Hgb A1c MFr Bld: 6.6 % — ABNORMAL HIGH (ref 4.8–5.6)
Mean Plasma Glucose: 143 mg/dL

## 2016-11-16 MED ORDER — SODIUM CHLORIDE 0.9 % IV BOLUS (SEPSIS): 250.0000 mL | Freq: Once | INTRAVENOUS | Status: DC

## 2016-11-16 MED ORDER — MAGNESIUM OXIDE 400 (241.3 MG) MG PO TABS
400.0000 mg | ORAL_TABLET | Freq: Every day | ORAL | Status: DC
  Administered 2016-11-16 – 2016-11-20 (×5): 400 mg via ORAL
  Filled 2016-11-16 (×5): qty 1

## 2016-11-16 MED ORDER — SODIUM CHLORIDE 0.9 % IV BOLUS (SEPSIS)
250.0000 mL | Freq: Once | INTRAVENOUS | Status: DC
  Administered 2016-11-16: 250 mL via INTRAVENOUS

## 2016-11-16 NOTE — Progress Notes (Signed)
CPAP set up.  Placed patient on 10.0 cm H20 (unknown home settings) via nasal mask.  Patient unable to tolerate at this time due to nausea &  Diaphoresis.  Pt states too much pressure. Decreased to 8.0 cm H20 but pt did not place back on to test for comfort. RN aware.

## 2016-11-16 NOTE — Progress Notes (Signed)
RN notified of pt not feeling well. RN promptly went to pt room. Pt is sweating, skin appears flushed, and pt reports nausea. Pt given IV zofran for nausea. Pt denies any current chest pain. CBG is 115. Vital signs reveal BP and HR decreased, RN paused cardizem drip and page and notified on call MD for further evaluation. No new orders at this time. RN will continue to monitor pt, pt call bell within reach with bed in lowest position and bed alarm in use.    11/16/16 0049  Vitals  Temp 97.6 F (36.4 C)  Temp Source Oral  BP (!) 93/58  BP Location Left Arm  BP Method Automatic  Patient Position (if appropriate) Lying  Pulse Rate (!) 55  Pulse Rate Source Dinamap  ECG Heart Rate 68  Resp 20  Oxygen Therapy  SpO2 93 %  O2 Device Room Air  Pain Assessment  Pain Assessment No/denies pain (denies chest pain)    Johnson,Valia Wingard A, RN 11/16/2016

## 2016-11-16 NOTE — Progress Notes (Signed)
MD ordered NS 230mL IV bolus once (can be repeated if needed), RN initiated orders. BP increased and pt reports relief of previous hypotensive symptoms, pt now resting. RN will continue to monitor. Pt bed in lowest position with bed alarm in use, pt call bell in within reach.    11/16/16 0207  Vitals  BP (!) 102/57 (post 223mL bolus)  BP Location Left Arm  BP Method Automatic  Patient Position (if appropriate) Lying  Pulse Rate 72  Pulse Rate Source Dinamap    Johnson,Bahar Shelden A, RN 11/16/2016

## 2016-11-16 NOTE — Progress Notes (Signed)
Progress Note  Patient Name: Jenna [name] Date of Encounter: 11/16/2016  Primary Cardiologist: Dr. Mali Hilty  Subjective   No chest pain or palpitations at rest. Lightheaded with low blood pressures overnight.  Inpatient Medications    Scheduled Meds: . acarbose  25 mg Oral TID  . ALPRAZolam  0.5 mg Oral BID  . apixaban  5 mg Oral BID  . aspirin EC  81 mg Oral Daily  . atorvastatin  40 mg Oral q1800  . buPROPion  150 mg Oral BID  . calcium-vitamin D  1 tablet Oral BID  . insulin aspart  0-15 Units Subcutaneous TID WC  . insulin aspart  0-5 Units Subcutaneous QHS  . levothyroxine  88 mcg Oral QAC breakfast  . magnesium oxide  400 mg Oral Daily  . metoprolol tartrate  25 mg Oral BID  . multivitamin  1 tablet Oral BID  . omega-3 acid ethyl esters  1 g Oral BID  . pantoprazole  40 mg Oral Daily  . potassium chloride SA  20 mEq Oral BID  . rOPINIRole  0.5 mg Oral Q1200  . rOPINIRole  0.5-1 mg Oral QHS  . sodium chloride  250 mL Intravenous Once  . sodium chloride flush  3 mL Intravenous Q12H  . traMADol  50 mg Oral Daily   Continuous Infusions: . diltiazem (CARDIZEM) infusion Stopped (11/16/16 0049)   PRN Meds: sodium chloride, acetaminophen, ALPRAZolam, nitroGLYCERIN, ondansetron (ZOFRAN) IV, sodium chloride flush, zolpidem   Vital Signs    Vitals:   11/16/16 0207 11/16/16 0458 11/16/16 0518 11/16/16 1000  BP: (!) 102/57  111/87 98/72  Pulse: 72  80 97  Resp:   18   Temp:   97.8 F (36.6 C)   TempSrc:   Oral   SpO2:   96%   Weight:  216 lb 3.2 oz (98.1 kg)    Height:        Intake/Output Summary (Last 24 hours) at 11/16/16 1252 Last data filed at 11/16/16 0830  Gross per 24 hour  Intake             2380 ml  Output                0 ml  Net             2380 ml   Filed Weights   11/15/16 1621 11/16/16 0458  Weight: 214 lb (97.1 kg) 216 lb 3.2 oz (98.1 kg)    Telemetry    I personally reviewed telemetry which shows atrial fibrillation.  ECG    I  personally reviewed the tracing from 11/16/2016 which shows atrial fibrillation with low voltage and poor R-wave progression.  Physical Exam   GEN: Obese woman, no acute distress.   Neck: No JVD Cardiac:  Irregularly irregular, 2/6 systolic murmur.  Respiratory: Clear to auscultation bilaterally. GI: Soft, nontender, non-distended  MS: No edema; No deformity.  Labs    Chemistry Recent Labs Lab 11/16/16 0533  NA 137  K 4.8  CL 97*  CO2 28  GLUCOSE 135*  BUN 26*  CREATININE 1.06*  CALCIUM 9.6  PROT 6.2*  ALBUMIN 3.4*  AST 64*  ALT 79*  ALKPHOS 96  BILITOT 0.7  GFRNONAA 50*  GFRAA 58*  ANIONGAP 12     Radiology    No results found.  Cardiac Studies   Echocardiogram pending.  Patient Profile     76 y.o. female with a history of hypertension, OSA, hypothyroidism, mild  aortic stenosis, and paroxysmal atrial fibrillation. She is currently admitted with persistent atrial fibrillation associated with symptomatic RVR. She has been started on Eliquis with anticipation of TEE guided cardioversion early this week.  Assessment & Plan    1. Persistent atrial fibrillation, CHADSVASC score of 4. Started on Eliquis in anticipation of a TEE guided cardioversion early this week. Did not tolerate intravenous diltiazem well due to systemic hypotension.  2. OSA.  3. Hypothyroidism, TSH 2.65 on Synthroid.  4. Mild aortic stenosis by prior echocardiogram, follow-up study pending.  Intravenous diltiazem has been discontinued. Currently on Lopressor 25 mg twice daily, can up titrate as needed for heart rate control. Continue Eliquis. Follow-up echocardiogram.  Signed, Rozann Lesches, MD  11/16/2016, 12:52 PM

## 2016-11-16 NOTE — Progress Notes (Signed)
Paged on call attending.  Pt with systolic BP 81.  Pt c/o nausea.  Cardizem drip being held.  Awaiting call back.  Will cont to monitor.

## 2016-11-16 NOTE — Progress Notes (Signed)
  Echocardiogram 2D Echocardiogram has been performed.  Johny Chess 11/16/2016, 10:42 AM

## 2016-11-16 NOTE — Progress Notes (Signed)
Patient refused CPAP tonight. There Is a machine in the room at this time. Patient stated she wear nasal pillows at home and may have family bring them in, she cant wear the mask comfortably and sleep.Explained to Patient that if they changed their mind, to just have the RN call Respiratory and we would come set them up.

## 2016-11-16 NOTE — Discharge Instructions (Addendum)

## 2016-11-17 DIAGNOSIS — I429 Cardiomyopathy, unspecified: Secondary | ICD-10-CM

## 2016-11-17 LAB — CBC
HCT: 35.6 % — ABNORMAL LOW (ref 36.0–46.0)
Hemoglobin: 11.1 g/dL — ABNORMAL LOW (ref 12.0–15.0)
MCH: 26.7 pg (ref 26.0–34.0)
MCHC: 31.2 g/dL (ref 30.0–36.0)
MCV: 85.6 fL (ref 78.0–100.0)
Platelets: 355 10*3/uL (ref 150–400)
RBC: 4.16 MIL/uL (ref 3.87–5.11)
RDW: 14 % (ref 11.5–15.5)
WBC: 8.6 10*3/uL (ref 4.0–10.5)

## 2016-11-17 LAB — BASIC METABOLIC PANEL
Anion gap: 9 (ref 5–15)
BUN: 28 mg/dL — ABNORMAL HIGH (ref 6–20)
CO2: 25 mmol/L (ref 22–32)
Calcium: 9.3 mg/dL (ref 8.9–10.3)
Chloride: 100 mmol/L — ABNORMAL LOW (ref 101–111)
Creatinine, Ser: 1.18 mg/dL — ABNORMAL HIGH (ref 0.44–1.00)
GFR calc Af Amer: 51 mL/min — ABNORMAL LOW (ref >60)
GFR calc non Af Amer: 44 mL/min — ABNORMAL LOW (ref >60)
Glucose, Bld: 124 mg/dL — ABNORMAL HIGH (ref 65–99)
Potassium: 4.5 mmol/L (ref 3.5–5.1)
Sodium: 134 mmol/L — ABNORMAL LOW (ref 135–145)

## 2016-11-17 LAB — GLUCOSE, CAPILLARY
Glucose-Capillary: 132 mg/dL — ABNORMAL HIGH (ref 65–99)
Glucose-Capillary: 121 mg/dL — ABNORMAL HIGH (ref 65–99)
Glucose-Capillary: 116 mg/dL — ABNORMAL HIGH (ref 65–99)
Glucose-Capillary: 124 mg/dL — ABNORMAL HIGH (ref 65–99)
Glucose-Capillary: 105 mg/dL — ABNORMAL HIGH (ref 65–99)

## 2016-11-17 NOTE — Progress Notes (Signed)
Progress Note  Patient Name: Jenna [name] Date of Encounter: 11/17/2016  Primary Cardiologist: Dr. Mali Hilty  Subjective   Still short of breath with activity. No chest pain.  Inpatient Medications    Scheduled Meds: . acarbose  25 mg Oral TID  . ALPRAZolam  0.5 mg Oral BID  . apixaban  5 mg Oral BID  . aspirin EC  81 mg Oral Daily  . atorvastatin  40 mg Oral q1800  . buPROPion  150 mg Oral BID  . calcium-vitamin D  1 tablet Oral BID  . insulin aspart  0-15 Units Subcutaneous TID WC  . insulin aspart  0-5 Units Subcutaneous QHS  . levothyroxine  88 mcg Oral QAC breakfast  . magnesium oxide  400 mg Oral Daily  . metoprolol tartrate  25 mg Oral BID  . multivitamin  1 tablet Oral BID  . omega-3 acid ethyl esters  1 g Oral BID  . pantoprazole  40 mg Oral Daily  . potassium chloride SA  20 mEq Oral BID  . rOPINIRole  0.5 mg Oral Q1200  . rOPINIRole  0.5-1 mg Oral QHS  . sodium chloride  250 mL Intravenous Once  . sodium chloride flush  3 mL Intravenous Q12H  . traMADol  50 mg Oral Daily   Continuous Infusions: . diltiazem (CARDIZEM) infusion Stopped (11/16/16 0049)   PRN Meds: sodium chloride, acetaminophen, ALPRAZolam, nitroGLYCERIN, ondansetron (ZOFRAN) IV, sodium chloride flush, zolpidem   Vital Signs    Vitals:   11/16/16 1943 11/17/16 0336 11/17/16 0440 11/17/16 0939  BP: 104/67 104/76  (!) 124/92  Pulse: 91 86  (!) 111  Resp: 18 18    Temp: 97.5 F (36.4 C) 97.4 F (36.3 C)    TempSrc: Oral Oral    SpO2: 93% 95%    Weight:   217 lb 14.4 oz (98.8 kg)   Height:        Intake/Output Summary (Last 24 hours) at 11/17/16 1300 Last data filed at 11/17/16 1154  Gross per 24 hour  Intake              240 ml  Output                2 ml  Net              238 ml   Filed Weights   11/15/16 1621 11/16/16 0458 11/17/16 0440  Weight: 214 lb (97.1 kg) 216 lb 3.2 oz (98.1 kg) 217 lb 14.4 oz (98.8 kg)    Telemetry    I personally reviewed telemetry which  shows atrial fibrillation.  ECG    I personally reviewed the tracing from 11/16/2016 which shows atrial fibrillation with low voltage and poor R-wave progression.  Physical Exam   GEN: Obese woman, no acute distress.   Neck: No JVD Cardiac:  Irregularly irregular, 2/6 systolic murmur.  Respiratory: Clear to auscultation bilaterally. GI: Soft, nontender, non-distended  MS: No edema; No deformity.  Labs    Chemistry  Recent Labs Lab 11/16/16 0533 11/17/16 0347  NA 137 134*  K 4.8 4.5  CL 97* 100*  CO2 28 25  GLUCOSE 135* 124*  BUN 26* 28*  CREATININE 1.06* 1.18*  CALCIUM 9.6 9.3  PROT 6.2*  --   ALBUMIN 3.4*  --   AST 64*  --   ALT 79*  --   ALKPHOS 96  --   BILITOT 0.7  --   GFRNONAA 50* 44*  GFRAA  [place] 9     Radiology    No results found.  Cardiac Studies   Echocardiogram 11/16/2016: Study Conclusions  - Left ventricle: The cavity size was mildly dilated. Wall   thickness was normal. Systolic function was moderately reduced.   The estimated ejection fraction was in the range of 35% to 40%. - Aortic valve: There was mild stenosis. Valve area (VTI): 0.71   cm^2. Valve area (Vmax): 0.7 cm^2. Valve area (Vmean): 0.67 cm^2. - Mitral valve: There was mild to moderate regurgitation. - Tricuspid valve: There was moderate regurgitation.  Patient Profile     76 y.o. female with a history of hypertension, OSA, hypothyroidism, mild aortic stenosis, and paroxysmal atrial fibrillation. She is currently admitted with persistent atrial fibrillation associated with symptomatic RVR. She has been started on Eliquis with anticipation of TEE guided cardioversion early this week.  Assessment & Plan    1. Persistent atrial fibrillation, CHADSVASC score of 4. Started on Eliquis in anticipation of a TEE guided cardioversion early this week. Did not tolerate intravenous diltiazem well due to systemic hypotension.  2. Cardiomyopathy, LVEF 35-40% range now in the  setting of atrial fibrillation. Potentially tachycardia-mediated. This will need to be reassessed after sinus rhythm is restored and on stable medical regimen.  3. OSA.  4. Hypothyroidism, TSH 2.65 on Synthroid.  5. Mild aortic stenosis.  Increase Lopressor to 25 mg TID. Continue Eliquis. TEE guided cardioversion anticipated for Tuesday.  Signed, Rozann Lesches, MD  11/17/2016, 1:00 PM

## 2016-11-17 NOTE — Progress Notes (Signed)
Patient refused CPAP for tonight and requested machine be pulled from room.  Advised by RT that, should she change her mind and have her home interface brought in, that we would bring her a machine.  Patient acknowledged.

## 2016-11-18 LAB — CBC
HCT: 36.4 % (ref 36.0–46.0)
Hemoglobin: 11.5 g/dL — ABNORMAL LOW (ref 12.0–15.0)
MCH: 26.9 pg (ref 26.0–34.0)
MCHC: 31.6 g/dL (ref 30.0–36.0)
MCV: 85 fL (ref 78.0–100.0)
Platelets: 376 10*3/uL (ref 150–400)
RBC: 4.28 MIL/uL (ref 3.87–5.11)
RDW: 14 % (ref 11.5–15.5)
WBC: 10.6 10*3/uL — ABNORMAL HIGH (ref 4.0–10.5)

## 2016-11-18 LAB — GLUCOSE, CAPILLARY
Glucose-Capillary: 118 mg/dL — ABNORMAL HIGH (ref 65–99)
Glucose-Capillary: 98 mg/dL (ref 65–99)
Glucose-Capillary: 112 mg/dL — ABNORMAL HIGH (ref 65–99)
Glucose-Capillary: 135 mg/dL — ABNORMAL HIGH (ref 65–99)

## 2016-11-18 LAB — BASIC METABOLIC PANEL
Anion gap: 9 (ref 5–15)
BUN: 26 mg/dL — ABNORMAL HIGH (ref 6–20)
CO2: 27 mmol/L (ref 22–32)
Calcium: 9.2 mg/dL (ref 8.9–10.3)
Chloride: 97 mmol/L — ABNORMAL LOW (ref 101–111)
Creatinine, Ser: 0.99 mg/dL (ref 0.44–1.00)
GFR calc Af Amer: >60 mL/min (ref >60)
GFR calc non Af Amer: 55 mL/min — ABNORMAL LOW (ref >60)
Glucose, Bld: 123 mg/dL — ABNORMAL HIGH (ref 65–99)
Potassium: 4.5 mmol/L (ref 3.5–5.1)
Sodium: 133 mmol/L — ABNORMAL LOW (ref 135–145)

## 2016-11-18 MED ORDER — SODIUM CHLORIDE 0.9 % IV SOLN
INTRAVENOUS | Status: DC
  Administered 2016-11-18 – 2016-11-19 (×3): via INTRAVENOUS
  Administered 2016-11-19: 500 mL via INTRAVENOUS

## 2016-11-18 NOTE — Progress Notes (Signed)
Progress Note  Patient Name: Jenna [name] Date of Encounter: 11/18/2016  Primary Cardiologist: Dr. Mali Hilty  Subjective   Still short of breath with activity. No chest pain.  Inpatient Medications    Scheduled Meds: . acarbose  25 mg Oral TID  . ALPRAZolam  0.5 mg Oral BID  . apixaban  5 mg Oral BID  . aspirin EC  81 mg Oral Daily  . atorvastatin  40 mg Oral q1800  . buPROPion  150 mg Oral BID  . calcium-vitamin D  1 tablet Oral BID  . insulin aspart  0-15 Units Subcutaneous TID WC  . insulin aspart  0-5 Units Subcutaneous QHS  . levothyroxine  88 mcg Oral QAC breakfast  . magnesium oxide  400 mg Oral Daily  . metoprolol tartrate  25 mg Oral BID  . multivitamin  1 tablet Oral BID  . omega-3 acid ethyl esters  1 g Oral BID  . pantoprazole  40 mg Oral Daily  . potassium chloride SA  20 mEq Oral BID  . rOPINIRole  0.5 mg Oral Q1200  . rOPINIRole  0.5-1 mg Oral QHS  . sodium chloride  250 mL Intravenous Once  . sodium chloride flush  3 mL Intravenous Q12H  . traMADol  50 mg Oral Daily   Continuous Infusions: . diltiazem (CARDIZEM) infusion Stopped (11/16/16 0049)   PRN Meds: sodium chloride, acetaminophen, ALPRAZolam, nitroGLYCERIN, ondansetron (ZOFRAN) IV, sodium chloride flush, zolpidem   Vital Signs    Vitals:   11/17/16 0440 11/17/16 0939 11/17/16 1926 11/18/16 0515  BP:  (!) 124/92 106/69 104/64  Pulse:  (!) 111 86 87  Resp:   17 18  Temp:   97.9 F (36.6 C) 97.6 F (36.4 C)  TempSrc:   Oral Oral  SpO2:   97% 100%  Weight: 217 lb 14.4 oz (98.8 kg)   219 lb 9.6 oz (99.6 kg)  Height:        Intake/Output Summary (Last 24 hours) at 11/18/16 0845 Last data filed at 11/17/16 1154  Gross per 24 hour  Intake                0 ml  Output                1 ml  Net               -1 ml   Filed Weights   11/16/16 0458 11/17/16 0440 11/18/16 0515  Weight: 216 lb 3.2 oz (98.1 kg) 217 lb 14.4 oz (98.8 kg) 219 lb 9.6 oz (99.6 kg)    Telemetry    I  personally reviewed telemetry which shows atrial fibrillation.  ECG    I personally reviewed the tracing from 11/16/2016 which shows atrial fibrillation with low voltage and poor R-wave progression.  Physical Exam   GEN: Obese woman, no acute distress.   Neck: No JVD Cardiac:  Irregularly irregular, 2/6 systolic murmur.  Respiratory: Clear to auscultation bilaterally. GI: Soft, nontender, non-distended  MS: No edema; No deformity.  Labs    Chemistry  Recent Labs Lab 11/16/16 0533 11/17/16 0347 11/18/16 0350  NA 137 134* 133*  K 4.8 4.5 4.5  CL 97* 100* 97*  CO2 28 25 27   GLUCOSE 135* 124* 123*  BUN 26* 28* 26*  CREATININE 1.06* 1.18* 0.99  CALCIUM 9.6 9.3 9.2  PROT 6.2*  --   --   ALBUMIN 3.4*  --   --   AST 64*  --   --  ALT 79*  --   --   ALKPHOS 96  --   --   BILITOT 0.7  --   --   GFRNONAA 50* 44* 55*  GFRAA 58* 51* >60  ANIONGAP 12 9 9      Radiology    No results found.  Cardiac Studies   Echocardiogram 11/16/2016: Study Conclusions  - Left ventricle: The cavity size was mildly dilated. Wall   thickness was normal. Systolic function was moderately reduced.   The estimated ejection fraction was in the range of 35% to 40%. - Aortic valve: There was mild stenosis. Valve area (VTI): 0.71   cm^2. Valve area (Vmax): 0.7 cm^2. Valve area (Vmean): 0.67 cm^2. - Mitral valve: There was mild to moderate regurgitation. - Tricuspid valve: There was moderate regurgitation.  Patient Profile     76 y.o. female with a history of hypertension, OSA, hypothyroidism, mild aortic stenosis, and paroxysmal atrial fibrillation. She is currently admitted with persistent atrial fibrillation associated with symptomatic RVR. She has been started on Eliquis with anticipation of TEE guided cardioversion early this week.  Assessment & Plan    1. Persistent atrial fibrillation, CHADSVASC score of 4. Started on Eliquis in anticipation of a TEE guided cardioversion Scheduled for  tomorrow have written orders and discussed with patient with Dr Stanford Breed   2. Cardiomyopathy, LVEF 35-40% range now in the setting of atrial fibrillation. Potentially tachycardia-mediated. This will need to be reassessed after sinus rhythm is restored and on stable medical regimen.  3. OSA.  4. Hypothyroidism, TSH 2.65 on Synthroid.  5. Mild aortic stenosis.  Increase Lopressor to 25 mg TID. Continue Eliquis. TEE guided cardioversion anticipated for Tomorrow   Signed, Jenkins Rouge, MD  11/18/2016, 8:45 AM

## 2016-11-18 NOTE — Progress Notes (Signed)
Pharmacist Heart Failure Core Measure Documentation  Assessment: Jenna [name] has an EF documented as 35-40% on 11/16/16 by ECHO.  Rationale: Heart failure patients with left ventricular systolic dysfunction (LVSD) and an EF < 40% should be prescribed an angiotensin converting enzyme inhibitor (ACEI) or angiotensin receptor blocker (ARB) at discharge unless a contraindication is documented in the medical record.  This patient is not currently on an ACEI or ARB for HF.  This note is being placed in the record in order to provide documentation that a contraindication to the use of these agents is present for this encounter.  ACE Inhibitor or Angiotensin Receptor Blocker is contraindicated (specify all that apply)  []   ACEI allergy AND ARB allergy []   Angioedema []   Moderate or severe aortic stenosis []   Hyperkalemia [x]   Hypotension []   Renal artery stenosis []   Worsening renal function, preexisting renal disease or dysfunction    Viktorya Arguijo D. Mina Marble, PharmD, BCPS Pager:  [phone] 11/18/2016, 9:18 AM

## 2016-11-18 NOTE — Progress Notes (Signed)
Patient refused CPAP for tonight 

## 2016-11-19 ENCOUNTER — Ambulatory Visit (HOSPITAL_COMMUNITY): Admission: RE | Admit: 2016-11-19 | Payer: Medicare Other | Source: Ambulatory Visit | Admitting: Cardiology

## 2016-11-19 ENCOUNTER — Inpatient Hospital Stay (HOSPITAL_COMMUNITY): Payer: Medicare Other | Admitting: Anesthesiology

## 2016-11-19 ENCOUNTER — Inpatient Hospital Stay (HOSPITAL_COMMUNITY): Payer: Medicare Other

## 2016-11-19 ENCOUNTER — Encounter (HOSPITAL_COMMUNITY): Payer: Self-pay | Admitting: *Deleted

## 2016-11-19 ENCOUNTER — Encounter (HOSPITAL_COMMUNITY)
Admission: AD | Disposition: A | Discharge status: Home / Self Care | Payer: Self-pay | Source: Ambulatory Visit | Attending: Cardiovascular Disease

## 2016-11-19 ENCOUNTER — Other Ambulatory Visit: Payer: Self-pay

## 2016-11-19 DIAGNOSIS — I340 Nonrheumatic mitral (valve) insufficiency: Secondary | ICD-10-CM

## 2016-11-19 DIAGNOSIS — I4891 Unspecified atrial fibrillation: Secondary | ICD-10-CM

## 2016-11-19 HISTORY — PX: CARDIOVERSION: SHX1299

## 2016-11-19 HISTORY — PX: TEE WITHOUT CARDIOVERSION: SHX5443

## 2016-11-19 LAB — GLUCOSE, CAPILLARY
Glucose-Capillary: 111 mg/dL — ABNORMAL HIGH (ref 65–99)
Glucose-Capillary: 101 mg/dL — ABNORMAL HIGH (ref 65–99)
Glucose-Capillary: 135 mg/dL — ABNORMAL HIGH (ref 65–99)
Glucose-Capillary: 108 mg/dL — ABNORMAL HIGH (ref 65–99)

## 2016-11-19 SURGERY — ECHOCARDIOGRAM, TRANSESOPHAGEAL
Anesthesia: General

## 2016-11-19 MED ORDER — BUTAMBEN-TETRACAINE-BENZOCAINE 2-2-14 % EX AERO
INHALATION_SPRAY | CUTANEOUS | Status: DC | PRN
  Administered 2016-11-19: 2 via TOPICAL

## 2016-11-19 MED ORDER — METOPROLOL TARTRATE 25 MG PO TABS
25.0000 mg | ORAL_TABLET | Freq: Two times a day (BID) | ORAL | Status: DC
  Administered 2016-11-19 – 2016-11-20 (×2): 25 mg via ORAL
  Filled 2016-11-19 (×2): qty 1

## 2016-11-19 MED ORDER — DEXMEDETOMIDINE HCL 200 MCG/2ML IV SOLN
INTRAVENOUS | Status: DC | PRN
  Administered 2016-11-19: 20 ug via INTRAVENOUS
  Administered 2016-11-19: 50 ug via INTRAVENOUS

## 2016-11-19 MED ORDER — FENTANYL CITRATE (PF) 100 MCG/2ML IJ SOLN
INTRAMUSCULAR | Status: DC | PRN
  Administered 2016-11-19: 50 ug via INTRAVENOUS
  Administered 2016-11-19: 25 ug via INTRAVENOUS

## 2016-11-19 MED ORDER — ONDANSETRON HCL 4 MG/2ML IJ SOLN
INTRAMUSCULAR | Status: DC | PRN
Start: 2016-11-19 — End: 2016-11-19
  Administered 2016-11-19: 4 mg via INTRAVENOUS

## 2016-11-19 MED ORDER — MIDAZOLAM HCL 5 MG/5ML IJ SOLN
INTRAMUSCULAR | Status: DC | PRN
  Administered 2016-11-19: 1 mg via INTRAVENOUS

## 2016-11-19 MED ORDER — ATROPINE SULFATE 0.4 MG/ML IJ SOLN
INTRAMUSCULAR | Status: DC | PRN
  Administered 2016-11-19: 0.4 mg via INTRAVENOUS

## 2016-11-19 MED ORDER — PROPOFOL 10 MG/ML IV BOLUS
INTRAVENOUS | Status: DC | PRN
  Administered 2016-11-19: 30 mg via INTRAVENOUS

## 2016-11-19 MED ORDER — EPHEDRINE SULFATE 50 MG/ML IJ SOLN
INTRAMUSCULAR | Status: DC | PRN
  Administered 2016-11-19 (×2): 10 mg via INTRAVENOUS

## 2016-11-19 MED ORDER — FLUMAZENIL 0.5 MG/5ML IV SOLN
INTRAVENOUS | Status: DC | PRN
  Administered 2016-11-19: 0.2 mg via INTRAVENOUS

## 2016-11-19 MED ORDER — NALOXONE HCL 0.4 MG/ML IJ SOLN
INTRAMUSCULAR | Status: DC | PRN
  Administered 2016-11-19: 80 ug via INTRAVENOUS

## 2016-11-19 NOTE — Transfer of Care (Signed)
Immediate Anesthesia Transfer of Care Note  Patient: DINNA SEVERS  Procedure(s) Performed: Procedure(s) with comments: TRANSESOPHAGEAL ECHOCARDIOGRAM (TEE) (N/A) - need CV, but no anesthesia available CARDIOVERSION (N/A)  Patient Location: PACU  Anesthesia Type:MAC  Level of Consciousness: sedated, patient cooperative and responds to stimulation  Airway & Oxygen Therapy: Patient Spontanous Breathing and Patient connected to nasal cannula oxygen  Post-op Assessment: Report given to RN, Post -op Vital signs reviewed and stable and Patient moving all extremities  Post vital signs: Reviewed and stable  Last Vitals:  Vitals:   11/19/16 0925 11/19/16 1045  BP: (!) 118/91 112/74  Pulse: (!) 120 64  Resp: (!) 23 20  Temp: 36.5 C (P) 36.1 C    Last Pain:  Vitals:   11/19/16 1045  TempSrc:   PainSc: (P) Asleep      Patients Stated Pain Goal: 1 (11/46/43 1427)  Complications: No apparent anesthesia complications

## 2016-11-19 NOTE — Progress Notes (Signed)
11/19/2016- Pt placed on home cpap for the night- tolerating well.

## 2016-11-19 NOTE — CV Procedure (Addendum)
TEE/DCCV  Pt sedated by anesthesia with precedex 60 micrograms and diprovan 30 mg IV total Severe LV dysfunction No LAA thrombus 4 chamber enlargement Severe RV dysfunction Mild to moderate MR Moderate TR Pt subsequently had DCCV (120 J and 150 J unsuccessful; 200 J successful to sinus bradycardia) Pt subsequently had significant bradycardia with HR 33 and BP 62/42; pt given 0.4 mg atropine with improved HR to 65 and SBP 105. Continue apixaban. I have DCed cardizem for now. Kirk Ruths   Following procedure, pt with intermittent hypotension with SBP 75 and prolonged period with inability to arouse; pt given romazacon 0.2 and narcan 80 micrograms by anesthesia with improved BP and begin to arouse; pt to be transferred to PACU for closer observation untilly fully awake. Kirk Ruths

## 2016-11-19 NOTE — Progress Notes (Signed)
  Echocardiogram Echocardiogram Transesophageal has been performed.  Jenna [name] 11/19/2016, 10:24 AM

## 2016-11-19 NOTE — Anesthesia Postprocedure Evaluation (Signed)
Anesthesia Post Note  Patient: Jenna [name]  Procedure(s) Performed: Procedure(s) (LRB): TRANSESOPHAGEAL ECHOCARDIOGRAM (TEE) (N/A) CARDIOVERSION (N/A)  Patient location during evaluation: PACU Anesthesia Type: General Level of consciousness: awake and alert Pain management: pain level controlled Vital Signs Assessment: post-procedure vital signs reviewed and stable Respiratory status: spontaneous breathing, nonlabored ventilation, respiratory function stable and patient connected to nasal cannula oxygen Cardiovascular status: blood pressure returned to baseline and stable Postop Assessment: no signs of nausea or vomiting Anesthetic complications: no       Last Vitals:  Vitals:   11/19/16 1158 11/19/16 1218  BP: 100/65 100/67  Pulse: 73 72  Resp: 19 14  Temp: 36.4 C 36.4 C    Last Pain:  Vitals:   11/19/16 1218  TempSrc: Oral  PainSc:                  Montez Hageman

## 2016-11-19 NOTE — Anesthesia Preprocedure Evaluation (Signed)
Anesthesia Evaluation  Patient identified by MRN, date of birth, ID band Patient awake    Reviewed: Allergy & Precautions, NPO status , Patient's Chart, lab work & pertinent test results  Airway Mallampati: II  TM Distance: >3 FB Neck ROM: Full    Dental no notable dental hx.    Pulmonary sleep apnea ,    Pulmonary exam normal breath sounds clear to auscultation       Cardiovascular hypertension, Pt. on medications Normal cardiovascular exam+ dysrhythmias Atrial Fibrillation  Rhythm:Regular Rate:Normal     Neuro/Psych negative neurological ROS  negative psych ROS   GI/Hepatic Neg liver ROS, GERD  ,  Endo/Other  diabetes, Type 2  Renal/GU negative Renal ROS  negative genitourinary   Musculoskeletal negative musculoskeletal ROS (+)   Abdominal   Peds negative pediatric ROS (+)  Hematology negative hematology ROS (+)   Anesthesia Other Findings   Reproductive/Obstetrics negative OB ROS                             Anesthesia Physical Anesthesia Plan  ASA: III  Anesthesia Plan: General   Post-op Pain Management:    Induction: Intravenous  Airway Management Planned: Nasal Cannula and Simple Face Mask  Additional Equipment:   Intra-op Plan:   Post-operative Plan:   Informed Consent: I have reviewed the patients History and Physical, chart, labs and discussed the procedure including the risks, benefits and alternatives for the proposed anesthesia with the patient or authorized representative who has indicated his/her understanding and acceptance.   Dental advisory given  Plan Discussed with: CRNA  Anesthesia Plan Comments:         Anesthesia Quick Evaluation

## 2016-11-19 NOTE — Progress Notes (Signed)
Pt had hypotension and bradycardia post TEE CV requiring Atropine. Since she has been on the floor she had 4 beat SVT followed by sinus bradycardia in the 40's. She has not been up yet. Will place hold parameters on Lopressor and mobilize today. Observe on telemetry overnight. She will bring her C-pap from home, she couldn't tolerate hospital mask.   Kerin Ransom PA-C 11/19/2016 1:09 PM

## 2016-11-19 NOTE — Progress Notes (Signed)
Progress Note  Patient Name: Jenna [name] Date of Encounter: 11/19/2016  Primary Cardiologist: Dr. Mali Hilty  Subjective   Didn't sleep well   Inpatient Medications    Scheduled Meds: . acarbose  25 mg Oral TID  . ALPRAZolam  0.5 mg Oral BID  . apixaban  5 mg Oral BID  . aspirin EC  81 mg Oral Daily  . atorvastatin  40 mg Oral q1800  . buPROPion  150 mg Oral BID  . calcium-vitamin D  1 tablet Oral BID  . insulin aspart  0-15 Units Subcutaneous TID WC  . insulin aspart  0-5 Units Subcutaneous QHS  . levothyroxine  88 mcg Oral QAC breakfast  . magnesium oxide  400 mg Oral Daily  . metoprolol tartrate  25 mg Oral BID  . multivitamin  1 tablet Oral BID  . omega-3 acid ethyl esters  1 g Oral BID  . pantoprazole  40 mg Oral Daily  . potassium chloride SA  20 mEq Oral BID  . rOPINIRole  0.5 mg Oral Q1200  . rOPINIRole  0.5-1 mg Oral QHS  . sodium chloride  250 mL Intravenous Once  . sodium chloride flush  3 mL Intravenous Q12H  . traMADol  50 mg Oral Daily   Continuous Infusions: . sodium chloride 20 mL/hr at 11/18/16 2243  . diltiazem (CARDIZEM) infusion Stopped (11/16/16 0049)   PRN Meds: sodium chloride, acetaminophen, ALPRAZolam, nitroGLYCERIN, ondansetron (ZOFRAN) IV, sodium chloride flush, zolpidem   Vital Signs    Vitals:   11/18/16 0515 11/18/16 1447 11/18/16 1959 11/19/16 0500  BP: 104/64 109/84 109/83 109/86  Pulse: 87 92 (!) 121 (!) 124  Resp: 18 18 20 18   Temp: 97.6 F (36.4 C) 97.5 F (36.4 C) 97.9 F (36.6 C) 97.5 F (36.4 C)  TempSrc: Oral Oral Oral Oral  SpO2: 100% 96% 96% 95%  Weight: 219 lb 9.6 oz (99.6 kg)   220 lb 4.8 oz (99.9 kg)  Height:        Intake/Output Summary (Last 24 hours) at 11/19/16 0825 Last data filed at 11/19/16 0400  Gross per 24 hour  Intake           585.67 ml  Output                0 ml  Net           585.67 ml   Filed Weights   11/17/16 0440 11/18/16 0515 11/19/16 0500  Weight: 217 lb 14.4 oz (98.8 kg) 219 lb  9.6 oz (99.6 kg) 220 lb 4.8 oz (99.9 kg)    Telemetry    I personally reviewed telemetry which shows atrial fibrillation.  ECG    I personally reviewed the tracing from 11/16/2016 which shows atrial fibrillation with low voltage and poor R-wave progression.  Physical Exam   GEN: Obese woman, no acute distress.   Neck: No JVD Cardiac:  Irregularly irregular, 2/6 systolic murmur.  Respiratory: Clear to auscultation bilaterally. GI: Soft, nontender, non-distended  MS: No edema; No deformity.  Labs    Chemistry  Recent Labs Lab 11/16/16 0533 11/17/16 0347 11/18/16 0350  NA 137 134* 133*  K 4.8 4.5 4.5  CL 97* 100* 97*  CO2 28 25 27   GLUCOSE [id number]* 124* 123*  BUN 26* 28* 26*  CREATININE 1.06* 1.18* 0.99  CALCIUM 9.6 9.3 9.2  PROT 6.2*  --   --   ALBUMIN 3.4*  --   --   AST 64*  --   --  ALT 79*  --   --   ALKPHOS 96  --   --   BILITOT 0.7  --   --   GFRNONAA 50* 44* 55*  GFRAA 58* 51* >60  ANIONGAP 12 9 9      Radiology    No results found.  Cardiac Studies   Echocardiogram 11/16/2016: Study Conclusions  - Left ventricle: The cavity size was mildly dilated. Wall   thickness was normal. Systolic function was moderately reduced.   The estimated ejection fraction was in the range of 35% to 40%. - Aortic valve: There was mild stenosis. Valve area (VTI): 0.71   cm^2. Valve area (Vmax): 0.7 cm^2. Valve area (Vmean): 0.67 cm^2. - Mitral valve: There was mild to moderate regurgitation. - Tricuspid valve: There was moderate regurgitation.  Patient Profile     76 y.o. female with a history of hypertension, OSA, hypothyroidism, mild aortic stenosis, and paroxysmal atrial fibrillation. She is currently admitted with persistent atrial fibrillation associated with symptomatic RVR. She has been started on Eliquis with anticipation of TEE guided cardioversion early this week.  Assessment & Plan    1. Persistent atrial fibrillation, CHADSVASC score of 4. Started on  Eliquis in anticipation of a TEE guided cardioversion Scheduled today with [facility]   2. Cardiomyopathy, LVEF 35-40% range now in the setting of atrial fibrillation. Potentially tachycardia-mediated. This will need to be reassessed after sinus rhythm is restored and on stable medical regimen.  3. OSA.  4. Hypothyroidism, TSH 2.65 on Synthroid.  5. Mild aortic stenosis.  Can be d/c home latter today after [facility] if stable   Signed, Jenkins Rouge, MD  11/19/2016, 8:25 AM

## 2016-11-19 NOTE — Care Management Important Message (Signed)
Important Message  Patient Details  Name: Jenna [name] MRN: [id number] Date of Birth: Jun 11, 1941   Medicare Important Message Given:  Yes    Nathen May 11/19/2016, 1:12 PM

## 2016-11-20 ENCOUNTER — Encounter (HOSPITAL_COMMUNITY): Payer: Self-pay | Admitting: Cardiology

## 2016-11-20 LAB — GLUCOSE, CAPILLARY
Glucose-Capillary: 96 mg/dL (ref 65–99)
Glucose-Capillary: 106 mg/dL — ABNORMAL HIGH (ref 65–99)
Glucose-Capillary: 133 mg/dL — ABNORMAL HIGH (ref 65–99)

## 2016-11-20 MED ORDER — VALSARTAN 160 MG PO TABS: 80.0000 mg | ORAL_TABLET | Freq: Every day | ORAL | 3 refills | Status: DC

## 2016-11-20 MED ORDER — ROPINIROLE HCL 1 MG PO TABS: 1.0000 mg | ORAL_TABLET | Freq: Every day | ORAL | Status: DC

## 2016-11-20 MED ORDER — METOPROLOL TARTRATE 12.5 MG HALF TABLET: 12.5000 mg | ORAL_TABLET | Freq: Two times a day (BID) | ORAL | Status: DC

## 2016-11-20 MED ORDER — METOPROLOL TARTRATE 25 MG PO TABS: 12.5000 mg | ORAL_TABLET | Freq: Two times a day (BID) | ORAL | 5 refills | Status: DC

## 2016-11-20 MED ORDER — APIXABAN 5 MG PO TABS: 5.0000 mg | ORAL_TABLET | Freq: Two times a day (BID) | ORAL | 5 refills | Status: DC

## 2016-11-20 MED ORDER — MAGNESIUM HYDROXIDE 400 MG/5ML PO SUSP
30.0000 mL | Freq: Every day | ORAL | Status: DC | PRN
  Administered 2016-11-20: 30 mL via ORAL
  Filled 2016-11-20: qty 30

## 2016-11-20 NOTE — Progress Notes (Signed)
Pt given discharge instructions.  Pt verbalized understanding of discharge instructions and follow up care.  Education provided re: new medications, diet, activity, when to call MD and follow up care.  Danton Clap, RN

## 2016-11-20 NOTE — Care Management Note (Signed)
Case Management Note Marvetta Gibbons RN, BSN Unit 2W-Case Manager [phone]  Patient Details  Name: Jenna [name] MRN: [id number] Date of Birth: 1941/04/18  Subjective/Objective:  Pt admitted with persistent afib, s/p cardioversion.                    Action/Plan: PTA pt lived at home, plan to return home- no CM needs noted for discharge  Expected Discharge Date:  11/20/16               Expected Discharge Plan:  Home/Self Care  In-House Referral:     Discharge planning Services  CM Consult  Post Acute Care Choice:  NA Choice offered to:  NA  DME Arranged:    DME Agency:     HH Arranged:    [place] Agency:     Status of Service:  Completed, signed off  If discussed at [employer] of Stay Meetings, dates discussed:    Additional Comments:  Dawayne Patricia, RN 11/20/2016, 10:43 AM

## 2016-11-20 NOTE — Discharge Summary (Signed)
Discharge Summary    Patient ID: Jenna [name],  MRN: [id number], DOB/AGE: 03-16-1941 76 y.o.  Admit date: 11/15/2016 Discharge date: 11/20/2016  Primary Care Provider: Chesley Noon Primary Cardiologist: Dr Debara Pickett  Discharge Diagnoses    Active Problems:   Persistent atrial fibrillation (HCC)   Allergies Allergies  Allergen Reactions  . Amoxicillin Nausea Only  . Crestor [Rosuvastatin] Other (See Comments)    Joints ached  . Lovastatin Other (See Comments)    Joints hurt all over body     Diagnostic Studies/Procedures    EKG:    11/19/16 10:52- sinus rhythm 65 bpm with diffuse non-specific T wave changes- done post DCCV 11/16/16 which shows atrial fibrillation with low voltage and poor R-wave progression.  11/19/2016 Echo Study Conclusions  - Left ventricle: The cavity size was dilated. Systolic function   was severely reduced. The estimated ejection fraction was in the   range of 20% to 25%. Diffuse hypokinesis. - Aortic valve: Valve mobility was restricted. No evidence of   vegetation. There was mild regurgitation. - Mitral valve: No evidence of vegetation. There was mild to   moderate regurgitation. - Left atrium: The atrium was moderately dilated. No evidence of   thrombus in the atrial cavity or appendage. - Right ventricle: The cavity size was mildly dilated. Systolic   function was severely reduced. - Right atrium: The atrium was moderately dilated. - Atrial septum: No defect or patent foramen ovale was identified. - Tricuspid valve: No evidence of vegetation. There was moderate   regurgitation. - Pulmonic valve: No evidence of vegetation.  Impressions:  - Severe global reduction in LV function; 4 chamber enlargement; No   LAA thrombus; severely reduced RV function; calcified aortic   valve with reduced cusp excursion; mild AI; mild to moderate MR;   moderate TR. __________   History of Present Illness     76 y.o. female with a history of  hypertension, OSA, hypothyroidism, mild aortic stenosis, and paroxysmal atrial fibrillation admitted with persistent atrial fibrillation associated with symptomatic RVR.   Hospital Course     Consultants: None  She was started on Eliquis and TEE/cardioversion was done. Post procedure she was in sinus bradycardia and required atropine. She has since then remained in sinus rhythm/sinus bradycardia in the 50's and 60's. Her BP is stable and she is asymptomatic. Will decrease her beta blocker. Echo showed decreased LV function with EF 20-25% with diffuse hypokinesis, possibly related to prolonged tachycardia. Plan to recheck echo for LV function after maintaining sinus rhythm and med adjustments. Reviewed symptoms for pt to report. Will discharge home today.   BP soft after DCCV and today. Will decrease losartan to 80 mg and plan to increase at follow up if needed.  Patient has been seen by Dr. Johnsie Cancel today and deemed ready for discharge home. All follow up appointments have been scheduled. Discharge medications are listed below. _____________  Discharge Vitals Blood pressure 121/67, pulse 65, temperature 98 F (36.7 C), temperature source Oral, resp. rate 18, height 5\' 5"  (1.651 m), weight 222 lb 10.6 oz (101 kg), SpO2 98 %.  Filed Weights   11/18/16 0515 11/19/16 0500 11/20/16 0607  Weight: 219 lb 9.6 oz (99.6 kg) 220 lb 4.8 oz (99.9 kg) 222 lb 10.6 oz (101 kg)   Physical Exam  Constitutional: She is oriented to person, place, and time. She appears well-developed and well-nourished.  HENT:  Head: Normocephalic and atraumatic.  Neck: Neck supple. No JVD present.  Cardiovascular:  Normal rate, regular rhythm and normal heart sounds.   Pulmonary/Chest: Effort normal and breath sounds normal.  Abdominal: Soft. Bowel sounds are normal.  Musculoskeletal: She exhibits no edema.  Neurological: She is alert and oriented to person, place, and time.  Skin: Skin is warm and dry.  Psychiatric: She has  a normal mood and affect. Her behavior is normal.   Labs & Radiologic Studies    CBC  Recent Labs  11/18/16 0350  WBC 10.6*  HGB 11.5*  HCT 36.4  MCV 85.0  PLT [id number]   Basic Metabolic Panel  Recent Labs  11/18/16 0350  NA 133*  K 4.5  CL 97*  CO2 27  GLUCOSE 123*  BUN 26*  CREATININE 0.99  CALCIUM 9.2   Liver Function Tests No results for input(s): AST, ALT, ALKPHOS, BILITOT, PROT, ALBUMIN in the last 72 hours. No results for input(s): LIPASE, AMYLASE in the last 72 hours. Cardiac Enzymes No results for input(s): CKTOTAL, CKMB, CKMBINDEX, TROPONINI in the last 72 hours. BNP Invalid input(s): POCBNP D-Dimer No results for input(s): DDIMER in the last 72 hours. Hemoglobin A1C No results for input(s): HGBA1C in the last 72 hours. Fasting Lipid Panel No results for input(s): CHOL, HDL, LDLCALC, TRIG, CHOLHDL, LDLDIRECT in the last 72 hours. Thyroid Function Tests No results for input(s): TSH, T4TOTAL, T3FREE, THYROIDAB in the last 72 hours.  Invalid input(s): FREET3 _____________  No results found. Disposition   Pt is being discharged home today in good condition.  Follow-up Plans & Appointments    Follow-up Information    Murray Hodgkins, NP Follow up on 12/03/2016.   Specialties:  Nurse Practitioner, Cardiology, Radiology Why:  at 10:30 for cardiology hospital follow up. Contact information: [address] [place] [address] [phone]          Discharge Instructions    Diet - low sodium heart healthy    Complete by:  As directed    Increase activity slowly    Complete by:  As directed       Discharge Medications   Current Discharge Medication List    START taking these medications   Details  apixaban (ELIQUIS) 5 MG TABS tablet Take 1 tablet (5 mg total) by mouth 2 (two) times daily. Qty: 60 tablet, Refills: 5    metoprolol tartrate (LOPRESSOR) 25 MG tablet Take 0.5 tablets (12.5 mg total) by mouth 2 (two) times  daily. Qty: 60 tablet, Refills: 5      CONTINUE these medications which have CHANGED   Details  valsartan (DIOVAN) 160 MG tablet Take 0.5 tablets (80 mg total) by mouth daily. Qty: 30 tablet, Refills: 3      CONTINUE these medications which have NOT CHANGED   Details  acarbose (PRECOSE) 25 MG tablet Take 25 mg by mouth 3 (three) times daily.      acetaminophen (TYLENOL) 500 MG tablet Take 500-1,000 mg by mouth every 6 (six) hours as needed for mild pain, moderate pain or headache.    ALPRAZolam (XANAX) 0.5 MG tablet Take 1 tablet by mouth 2 (two) times daily.    aspirin 81 MG tablet Take 81 mg by mouth daily.      atorvastatin (LIPITOR) 40 MG tablet TAKE 1 TABLET (40 MG TOTAL) BY MOUTH DAILY. Qty: 30 tablet, Refills: 11    buPROPion (WELLBUTRIN XL) 150 MG 24 hr tablet TAKE ONE TABLET (150 MG TOTAL) BY MOUTH TWICE DAILY    Calcium Carbonate-Vitamin D (CALCIUM + D PO) Take  600 mg by mouth 2 (two) times daily.     CHERRY PO Take by mouth. Drink 1/2 cup before breakfast and 1/2 cup before supper    Ginger, Zingiber officinalis, (GINGER PO) Take 1 capsule by mouth 2 (two) times daily.     levothyroxine (SYNTHROID, LEVOTHROID) 88 MCG tablet Take 88 mcg by mouth daily before breakfast.    Magnesium 250 MG TABS Take 2 tablets by mouth daily.     meclizine (ANTIVERT) 25 MG tablet Take 1 tablet (25 mg total) by mouth 3 (three) times daily as needed for dizziness. Qty: 30 tablet, Refills: 0    Misc. Devices MISC by Does not apply route at bedtime. CPAP    Multiple Vitamins-Minerals (PRESERVISION AREDS 2) CAPS Take 1 capsule by mouth 2 (two) times daily.    Omega-3 Fatty Acids (FISH OIL) 1200 MG CAPS Take 1 capsule by mouth 2 (two) times daily. Take 1 at breakfast and 1 at supper    omeprazole (PRILOSEC) 40 MG capsule TAKE ONE CAPSULE BY MOUTH EVERY DAY    potassium chloride SA (KLOR-CON M20) 20 MEQ tablet Take 1 tablet by mouth 2 (two) times daily.    rOPINIRole (REQUIP) 0.5 MG  tablet Take 2-3 daily at bedtime as needed Qty: 90 tablet, Refills: 3    torsemide (DEMADEX) 5 MG tablet Take 5-15 mg by mouth daily as needed (FOR FLUID).     traMADol (ULTRAM) 50 MG tablet Take 50 mg by mouth at bedtime.       STOP taking these medications     carvedilol (COREG) 25 MG tablet      TURMERIC PO           Outstanding Labs/Studies   None  Duration of Discharge Encounter   Greater than 30 minutes including physician time.  Signed, Daune Perch NP 11/20/2016, 10:47 AM  Doing well post [facility] Some of hypotension due to anesthesia. Maint NSR decrease beta blocker Continue anticoagulation Has ambulated with no issues Chest ok with no burns from [facility] Outpatient f/u with Dr Remo Lipps

## 2016-11-25 ENCOUNTER — Telehealth: Payer: Self-pay | Admitting: Internal Medicine

## 2016-11-25 NOTE — Telephone Encounter (Signed)
New message       Daughter states that pt was discharged from the hosp on last wed.  She is c/o of nausea, fatigue and sob on exertion.  Pt denies cp.  Daughter moved appt to this wed instead of next week.  Please call pt at [phone] to see if she needs to be seen sooner than wed.

## 2016-11-25 NOTE — Telephone Encounter (Signed)
Returned call to patient's daughter Kenney Houseman she stated mother had a cardioversion last Tue 11/19/16.Stated she feels like her out of rhythm rate 120 to 130, B/P 110/92,she feels weak,no energy.Spoke to DOD Dr.Hilty he advised to increase Metoprolol to 25 mg twice a day.Keep appointment as planned with Almyra Deforest PA Wed 11/27/16 at 10:30 am.

## 2016-11-27 ENCOUNTER — Ambulatory Visit (INDEPENDENT_AMBULATORY_CARE_PROVIDER_SITE_OTHER): Payer: Medicare Other | Admitting: Physician Assistant

## 2016-11-27 ENCOUNTER — Encounter: Payer: Self-pay | Admitting: Physician Assistant

## 2016-11-27 VITALS — BP 117/90 | HR 125 | Ht 65.0 in | Wt 225.0 lb

## 2016-11-27 DIAGNOSIS — I5023 Acute on chronic systolic (congestive) heart failure: Secondary | ICD-10-CM | POA: Diagnosis not present

## 2016-11-27 DIAGNOSIS — G4733 Obstructive sleep apnea (adult) (pediatric): Secondary | ICD-10-CM

## 2016-11-27 DIAGNOSIS — I4891 Unspecified atrial fibrillation: Secondary | ICD-10-CM

## 2016-11-27 DIAGNOSIS — I10 Essential (primary) hypertension: Secondary | ICD-10-CM | POA: Diagnosis not present

## 2016-11-27 DIAGNOSIS — E039 Hypothyroidism, unspecified: Secondary | ICD-10-CM | POA: Diagnosis not present

## 2016-11-27 DIAGNOSIS — Z9989 Dependence on other enabling machines and devices: Secondary | ICD-10-CM

## 2016-11-27 DIAGNOSIS — E118 Type 2 diabetes mellitus with unspecified complications: Secondary | ICD-10-CM | POA: Diagnosis not present

## 2016-11-27 MED ORDER — AMIODARONE HCL 200 MG PO TABS: 200.0000 mg | ORAL_TABLET | ORAL | 3 refills | Status: DC

## 2016-11-27 NOTE — Patient Instructions (Signed)
Medication Instructions: Almyra Deforest, PA has recommended making the following medication changes: 1. STOP Aspirin 2. HOLD Valsartan until otherwise noted - please DO NOT throw away this medication 3. START Amiodarone - take as directed  >>TAKE 2 tablets (400 mg) twice daily for 7 days  >>THEN TAKE 1 tablet (200 mg) twice daily for 1 month  >>THEN TAKE 1 tablet (200 mg ) once daily thereafter 4. INCREASE Torsemide to 3 tablets (15 mg total) daily for 3 days, then cut back to 2 tablets (10 mg total) daily until your follow-up appointment  Labwork: Your physician recommends that you return for lab work [place].  Testing/Procedures: NONE ORDERED  Follow-up: Jenna [name] recommends that you schedule a follow-up appointment on [facility]. An appointment has already been made for you!  If you need a refill on your cardiac medications before your next appointment, please call your pharmacy.

## 2016-11-27 NOTE — Progress Notes (Signed)
Cardiology Office Note    Date:  11/27/2016   ID:  Jenna [name], DOB 05-Nov-1940, MRN [id number]  PCP:  Chesley Noon, MD  Cardiologist:  Dr. Debara Pickett and Dr. Sallyanne Kuster  Chief Complaint  Patient presents with  . Follow-up    pt c/o SOb weight gain and chest pain     History of Present Illness:  Jenna [name] is a 76 y.o. female with PMH of DM II, hypertension, hypothyroidism, OSA on CPAP and recurrent atrial fibrillation. Her TSH was normal in April 2017. She had a normal stress test with normal LVEF in 2013. She had echocardiogram in 2013 that showed a normal EF. Previous treatment included diltiazem which led to weight gain, she has been transitioned to carvedilol 12.5 mg twice a day. She was seen the cardiology office on 11/15/2016, she looked ill at the time and her blood pressure did not allow up titration in the outpatient fashion. Therefore patient was admitted to the hospital. Echocardiogram obtained on 11/16/2016 showed EF down to 35-40%, mild-to-moderate MR, moderate TR. Ejection fraction has decreased since previous 2013 echo. She was started on eliquis and he eventually underwent TEE guided cardioversion on 11/19/2016. He had attempted DCCV that there are 122 under 150 J both of which were unsuccessful, however she finally had successful cardioversion at 200 J. Post-cardioversion, she had significant bradycardia with heart rate down to 33 and the blood pressure was 62/42. She was given a single dose of atropine with improvement in both the heart rate in the blood pressure. Diltiazem was discontinued. The Echo also showed severe RV dysfunction, mild to moderate MR, moderate TR. Her losartan was decreased to 80 mg.  She presents today for cardiology office visit, unfortunately based on EKG she has went back into atrial fibrillation with RVR with heart rate of 125. She keep a close blood pressure and heart rate diary with her. Based on the diary, she has likely converted either on the night of  11/22/2016 ordering in the morning of 11/23/2016 with her heart rate jumped from 60 to 70s baseline up to 120s. She continued to have significant amount of fatigue. She has shortness of breath with movement as well. On lower extremity, she has at least 1 to 2+ pitting edema, she appears to be in acute on chronic systolic heart failure. Fortunately, her O2 saturations 97% on room air and her lung is very much clear on physical exam. I do not think she needs to go to the hospital at this time. Our goal should be focused on rate control which has directly affected her volume status as well. Given the previous postconversion hypotension and the need for aggressive diuresis, I decided to temporarily hold her valsartan. Of course given her LV dysfunction, our goal should be improvement of LV function, therefore her valsartan likely will be restarted at a later time. She is currently taking half a tablet of 160 mg valsartan daily.   Since she is taking eliquis, I also plan to discontinue aspirin. She says she has been compliant with eliquis and has not missed a single dose since her recent cardioversion. Also given the fact that she was only able to convert on the third cardioversion shock and also she only maintaining sinus rhythm for 3 days afterward, I think she has higher likelihood of recurrence. However she is quite symptomatic in atrial fibrillation, I think it would be a good idea to see start loading her on antiarrhythmic medication. I have started amiodarone 400  mg twice a day for week then decrease to 200 mg twice a day for one month then decrease to 200 mg daily thereafter. I plan to see her in one week to follow-up on her heart rate and volume status. As far as home torsemide, she did take 10 mg torsemide this morning, she has not had significant urinary output. Her recent labs shows normal kidney function. I will increase her torsemide to 15 mg daily for 3 days then go back to 10 mg daily until she see me.  She is taking 20 mEq potassium supplement twice a day. Since she was started on amiodarone, I also recommended yearly vision check. I will check a TSH and alsometabolic panel next Monday. Again I will see her next Wednesday for close outpatient monitoring.    Past Medical History:  Diagnosis Date  . Anxiety   . Chronic lower back pain   . Depression   . Fatigue   . GERD (gastroesophageal reflux disease)   . Heart murmur   . High cholesterol   . Hypertension   . Hypothyroidism   . Melanoma of shoulder, left ([place])   . Mild aortic stenosis   . OSA on CPAP    "I wear pillows" (11/15/2016)  . Osteoarthritis   . Palpitations   . Persistent atrial fibrillation with RVR ([place])    [name] 11/15/2016  . RLS (restless legs syndrome)   . Type II diabetes mellitus ([place])     Past Surgical History:  Procedure Laterality Date  . CARDIOVERSION N/A 11/19/2016   Procedure: CARDIOVERSION;  Surgeon: Lelon Perla, MD;  [facility] ENDOSCOPY;  Service: Cardiovascular;  Laterality: N/A;  . CATARACT EXTRACTION W/ INTRAOCULAR LENS  IMPLANT, BILATERAL Bilateral   . DILATION AND CURETTAGE OF UTERUS    . LEXISCAN MYOVIEW  05/13/2012   No ECG changes. EKG negative for ischemia. No significant ischemia demonstrated.  Jenna [name] MELANOMA EXCISION Left ~ 09/1979   "back of my shoulder"  . TEE WITHOUT CARDIOVERSION N/A 11/19/2016   Procedure: TRANSESOPHAGEAL ECHOCARDIOGRAM (TEE);  Surgeon: Lelon Perla, MD;  Location: [facility] ENDOSCOPY;  Service: Cardiovascular;  Laterality: N/A;  need CV, but no anesthesia available  . TRANSTHORACIC ECHOCARDIOGRAM  02/18/2012   EF >55%, mild aortic stenosis  . VAGINAL HYSTERECTOMY  1985    Current Medications: Outpatient Medications Prior to Visit  Medication Sig Dispense Refill  . acarbose (PRECOSE) 25 MG tablet Take 25 mg by mouth 3 (three) times daily.      Jenna [name] acetaminophen (TYLENOL) 500 MG tablet Take 500-1,000 mg by mouth every 6 (six) hours as needed for mild pain, moderate pain  or headache.    . ALPRAZolam (XANAX) 0.5 MG tablet Take 1 tablet by mouth 2 (two) times daily.    Jenna [name] apixaban (ELIQUIS) 5 MG TABS tablet Take 1 tablet (5 mg total) by mouth 2 (two) times daily. 60 tablet 5  . atorvastatin (LIPITOR) 40 MG tablet TAKE 1 TABLET (40 MG TOTAL) BY MOUTH DAILY. 30 tablet 11  . buPROPion (WELLBUTRIN XL) 150 MG 24 hr tablet TAKE ONE TABLET (150 MG TOTAL) BY MOUTH TWICE DAILY    . Calcium Carbonate-Vitamin D (CALCIUM + D PO) Take 600 mg by mouth 2 (two) times daily.     Jenna [name] CHERRY PO Take by mouth. Drink 1/2 cup before breakfast and 1/2 cup before supper    . Ginger, Zingiber officinalis, (GINGER PO) Take 1 capsule by mouth 2 (two) times daily.     Jenna [name] levothyroxine (  SYNTHROID, LEVOTHROID) 88 MCG tablet Take 88 mcg by mouth daily before breakfast.    . Magnesium 250 MG TABS Take 2 tablets by mouth daily.     . meclizine (ANTIVERT) 25 MG tablet Take 1 tablet (25 mg total) by mouth 3 (three) times daily as needed for dizziness. 30 tablet 0  . metoprolol tartrate (LOPRESSOR) 25 MG tablet Take 1 tablet (25 mg total) by mouth 2 (two) times daily. 180 tablet 3  . Misc. Devices MISC by Does not apply route at bedtime. CPAP    . Multiple Vitamins-Minerals (PRESERVISION AREDS 2) CAPS Take 1 capsule by mouth 2 (two) times daily.    . Omega-3 Fatty Acids (FISH OIL) 1200 MG CAPS Take 1 capsule by mouth 2 (two) times daily. Take 1 at breakfast and 1 at supper    . omeprazole (PRILOSEC) 40 MG capsule TAKE ONE CAPSULE BY MOUTH EVERY DAY    . potassium chloride SA (KLOR-CON M20) 20 MEQ tablet Take 1 tablet by mouth 2 (two) times daily.    Jenna [name] rOPINIRole (REQUIP) 0.5 MG tablet Take 2-3 daily at bedtime as needed (Patient taking differently: Take 0.5-1 mg by mouth 2 (two) times daily. 0.5 mg by mouth midday then 0.5-1 mg at bedtime) 90 tablet 3  . torsemide (DEMADEX) 5 MG tablet Take 5-15 mg by mouth daily as needed (FOR FLUID).     Jenna [name] traMADol (ULTRAM) 50 MG tablet Take 50 mg by mouth at bedtime.      . valsartan (DIOVAN) 160 MG tablet Take 0.5 tablets (80 mg total) by mouth daily. 30 tablet 3  . aspirin 81 MG tablet Take 81 mg by mouth daily.       No facility-administered medications prior to visit.      Allergies:   Amoxicillin; Crestor [rosuvastatin]; and Lovastatin   Social History   Social History  . Marital status: Widowed    Spouse name: N/A  . Number of children: N/A  . Years of education: N/A   Social History Main Topics  . Smoking status: Never Smoker  . Smokeless tobacco: Never Used  . Alcohol use No  . Drug use: No  . Sexual activity: No   Other Topics Concern  . None   Social History Narrative  . None     Family History:  The patient's family history includes Arrhythmia in her sister; Cancer in her paternal grandfather; Diabetes in her mother; Healthy in her brother and daughter; Heart failure in her father; Hyperlipidemia in her sister and son; Hypertension in her mother, sister, and son; Stroke in her paternal grandmother.   ROS:   Please see the history of present illness.    ROS All other systems reviewed and are negative.   PHYSICAL EXAM:   VS:  BP 117/90   Pulse (!) 125   Ht 5\' 5"  (1.651 m)   Wt 225 lb (102.1 kg)   SpO2 97%   BMI 37.44 kg/m    GEN: Well nourished, well developed, in no acute distress  HEENT: normal  Neck: no JVD, carotid bruits, or masses Cardiac: irregularly irregular; no murmurs, rubs, or gallops.  1-2 + pitting edema in BLE  Respiratory:  clear to auscultation bilaterally, normal work of breathing GI: soft, nontender, nondistended, + BS MS: no deformity or atrophy  Skin: warm and dry, no rash Neuro:  Alert and Oriented x 3, Strength and sensation are intact Psych: euthymic mood, full affect  Wt Readings from Last 3 Encounters:  11/27/16  225 lb (102.1 kg)  11/20/16 222 lb 10.6 oz (101 kg)  11/15/16 216 lb (98 kg)      Studies/Labs Reviewed:   EKG:  EKG is ordered today.  The ekg ordered today demonstrates  Atrial fibrillation with heart rate 125.  Recent Labs: 11/15/2016: TSH 2.652 11/16/2016: ALT 79 11/18/2016: BUN 26; Creatinine, Ser 0.99; Hemoglobin 11.5; Platelets 376; Potassium 4.5; Sodium 133   Lipid Panel No results found for: CHOL, TRIG, HDL, CHOLHDL, VLDL, LDLCALC, LDLDIRECT  Additional studies/ records that were reviewed today include:   myoview 8/7//2013    Echo 02/18/2012    Echo 11/16/2016  LV EF: 35% -   40% --------------------------------------- Study Conclusions  - Left ventricle: The cavity size was mildly dilated. Wall   thickness was normal. Systolic function was moderately reduced.   The estimated ejection fraction was in the range of 35% to 40%. - Aortic valve: There was mild stenosis. Valve area (VTI): 0.71   cm^2. Valve area (Vmax): 0.7 cm^2. Valve area (Vmean): 0.67 cm^2. - Mitral valve: There was mild to moderate regurgitation. - Tricuspid valve: There was moderate regurgitation.   ASSESSMENT:    1. Atrial fibrillation with RVR ([place])   2. Essential hypertension   3. Hypothyroidism, unspecified type   4. OSA on CPAP   5. Controlled type 2 diabetes mellitus with complication, without long-term current use of insulin ([place])   6. Acute on chronic systolic heart failure (H[facility])      PLAN:  In order of problems listed above:  1. Recurrent atrial fibrillation with RVR: She recently underwent TEE cardioversion, was only able to be converted on the third shock. Unfortunately she was only able to maintain sinus rhythm for 3 days after cardioversion. Today she is in atrial flutter ablation with heart rate in the 120s. She has at least 2+ pitting edema in the lower extremity. I will continue on the current dose of beta blocker, I think she needs antiarrhythmic therapy as the risk of recurrence is quite high in this patient. I will start her on amiodarone therapy 400 mg twice a day for one week, then decrease down to 200 mg twice a day for 1 month, then decrease  down to 200 mg daily thereafter. Fortunately, she has been compliant with her eliquis since cardioversion. I will discontinue aspirin. Since she has some low blood pressure after cardioversion last time and her blood pressure is borderline today, I will recommend temporarily hold losartan with planned to restart it later for her LV dysfunction.  - With the start of amiodarone, I recommended yearly vision check, I will also obtain a TSH next Monday to establish a baseline. Once her acute issue is over, then we can consider pulmonary function test.  2. Acute on chronic systolic heart failure: Previous echocardiogram showed she has new LV dysfunction with EF 35-40%, this is likely related to tachycardia. We'll focus on rate control. I temporarily held her valsartan today to increase her blood pressure prior to chemical conversion. We plan to restart losartan at a later time once her heart rate is better controlled. Also plan to add spironolactone at some point improve her EF. Will assess later if she would be better on Entresto  - She is clearly volume overloaded today, she is 9 pounds over her baseline. She has at least 2+ pitting edema on physical exam. She has as needed torsemide at home, she did take 2 tablets this morning. She has not noticed significant urinary output. I recommended  her to increase torsemide to 15 mg daily for 3 days, then go down to 10 mg daily thereafter. She is taking potassium supplement 20 meq twice a day. Plan to check basic metabolic panel next Monday.  3. Hypertension: Blood pressure is well-controlled, as mentioned above, I plan to hold her valsartan temporarily while aggressively control her atrial fibrillation. Plan to restart losartan at a later time.  4. DM 2: will deferred to PCP.  5. OSA on CPAP: She is compliant with CPAP  6. Hypothyroidism: She is on Synthroid.    Medication Adjustments/Labs and Tests Ordered: Current medicines are reviewed at length with the  patient today.  Concerns regarding medicines are outlined above.  Medication changes, Labs and Tests ordered today are listed in the Patient Instructions below. Patient Instructions  Medication Instructions: Almyra Deforest, PA has recommended making the following medication changes: 1. STOP Aspirin 2. HOLD Valsartan until otherwise noted - please DO NOT throw away this medication 3. START Amiodarone - take as directed  >>TAKE 2 tablets (400 mg) twice daily for 7 days  >>THEN TAKE 1 tablet (200 mg) twice daily for 1 month  >>THEN TAKE 1 tablet (200 mg ) once daily thereafter 4. INCREASE Torsemide to 3 tablets (15 mg total) daily for 3 days, then cut back to 2 tablets (10 mg total) daily until your follow-up appointment  Labwork: Your physician recommends that you return for lab work [place].  Testing/Procedures: NONE ORDERED  Follow-up: Isaac Laud recommends that you schedule a follow-up appointment on [facility]. An appointment has already been made for you!  If you need a refill on your cardiac medications before your next appointment, please call your pharmacy.    Hilbert Corrigan, [place]  11/27/2016 3:45 PM    [facility] [address], [place], [place]  [address] Phone: [phone]; Fax: [phone]

## 2016-12-02 LAB — BASIC METABOLIC PANEL
BUN: 32 mg/dL — ABNORMAL HIGH (ref 7–25)
CO2: 26 mmol/L (ref 20–31)
Calcium: 9.3 mg/dL (ref 8.6–10.4)
Chloride: 95 mmol/L — ABNORMAL LOW (ref 98–110)
Creat: 1.49 mg/dL — ABNORMAL HIGH (ref 0.60–0.93)
Glucose, Bld: 102 mg/dL — ABNORMAL HIGH (ref 65–99)
Potassium: 4.9 mmol/L (ref 3.5–5.3)
Sodium: 133 mmol/L — ABNORMAL LOW (ref 135–146)

## 2016-12-02 LAB — TSH: TSH: 3.04 mIU/L

## 2016-12-03 ENCOUNTER — Ambulatory Visit: Payer: Medicare Other | Admitting: Nurse Practitioner

## 2016-12-04 ENCOUNTER — Ambulatory Visit (INDEPENDENT_AMBULATORY_CARE_PROVIDER_SITE_OTHER): Payer: Medicare Other | Admitting: Physician Assistant

## 2016-12-04 ENCOUNTER — Other Ambulatory Visit: Payer: Self-pay | Admitting: Physician Assistant

## 2016-12-04 ENCOUNTER — Encounter: Payer: Self-pay | Admitting: Physician Assistant

## 2016-12-04 ENCOUNTER — Encounter: Payer: Self-pay | Admitting: Internal Medicine

## 2016-12-04 VITALS — BP 122/84 | HR 133 | Ht 65.5 in | Wt 228.2 lb

## 2016-12-04 DIAGNOSIS — I4819 Other persistent atrial fibrillation: Secondary | ICD-10-CM

## 2016-12-04 DIAGNOSIS — E039 Hypothyroidism, unspecified: Secondary | ICD-10-CM | POA: Diagnosis not present

## 2016-12-04 DIAGNOSIS — Z79899 Other long term (current) drug therapy: Secondary | ICD-10-CM

## 2016-12-04 DIAGNOSIS — E118 Type 2 diabetes mellitus with unspecified complications: Secondary | ICD-10-CM | POA: Diagnosis not present

## 2016-12-04 DIAGNOSIS — G4733 Obstructive sleep apnea (adult) (pediatric): Secondary | ICD-10-CM

## 2016-12-04 DIAGNOSIS — I10 Essential (primary) hypertension: Secondary | ICD-10-CM

## 2016-12-04 DIAGNOSIS — I481 Persistent atrial fibrillation: Secondary | ICD-10-CM | POA: Diagnosis not present

## 2016-12-04 DIAGNOSIS — I5023 Acute on chronic systolic (congestive) heart failure: Secondary | ICD-10-CM | POA: Diagnosis not present

## 2016-12-04 DIAGNOSIS — Z9989 Dependence on other enabling machines and devices: Secondary | ICD-10-CM | POA: Diagnosis not present

## 2016-12-04 MED ORDER — METOPROLOL TARTRATE 25 MG PO TABS: 25.0000 mg | ORAL_TABLET | Freq: Two times a day (BID) | ORAL | 3 refills | Status: DC

## 2016-12-04 MED ORDER — TORSEMIDE 5 MG PO TABS: 5.0000 mg | ORAL_TABLET | Freq: Every day | ORAL | 2 refills | Status: DC

## 2016-12-04 NOTE — Progress Notes (Signed)
Cardiology Office Note    Date:  12/04/2016   ID:  Jenna [name], DOB 01-27-1941, MRN [id number]  PCP:  Chesley Noon, MD  Cardiologist:  Dr. Debara Pickett and Dr. Sallyanne Kuster   Chief Complaint  Patient presents with  . Edema  . Shortness of Breath  . Follow-up    seen for Dr. Debara Pickett, atrial fibrillation with RVR    History of Present Illness:  Jenna [name] is a 76 y.o. female with PMH of DM II, hypertension, hypothyroidism, OSA on CPAP and recurrent atrial fibrillation. Her TSH was normal in April 2017. She had a normal stress test with normal LVEF in 2013. She had echocardiogram in 2013 that showed a normal EF. Previous treatment included diltiazem which led to weight gain, she has been transitioned to carvedilol 12.5 mg twice a day. She was seen the cardiology office on 11/15/2016, she looked ill at the time and her blood pressure did not allow up titration in the outpatient fashion. Therefore patient was admitted to the hospital. Echocardiogram obtained on 11/16/2016 showed EF down to 35-40%, mild-to-moderate MR, moderate TR. Ejection fraction has decreased since previous 2013 echo. She was started on eliquis and he eventually underwent TEE guided cardioversion on 11/19/2016. He had attempted DCCV that there are 122 under 150 J both of which were unsuccessful, however she finally had successful cardioversion at 200 J. Post-cardioversion, she had significant bradycardia with heart rate down to 33 and the blood pressure was 62/42. She was given a single dose of atropine with improvement in both the heart rate in the blood pressure. Diltiazem was discontinued. The Echo also showed severe RV dysfunction, mild to moderate MR, moderate TR. Her losartan was decreased to 80 mg.  I last saw the patient in the office a week ago on 11/27/2016, based on EKG, she went back into atrial fibrillation with RVR. She did have a blood pressure diary along with her pulse at home, based on this diary, she most likely went back  into atrial fibrillation roughly around the night of 11/22/2016 or the morning of 11/23/2016, as she was able to stay in his sinus rhythm for less than 3 days after cardioversion. I felt she likely needed antiarrhythmic medication. She also appears to be volume overloaded on physical exam with at least 1-2+ pitting edema consistent with acute on chronic systolic heart failure. I started her on 15 mg torsemide for 3 days before going down to 10 mg daily. I decided to temporarily held her valsartan to give her more blood pressure room during diuresis. Fortunately, she has been compliant with eliquis, I did discontinue aspirin. Mild original plan was to continue 400 mg twice a day of amiodarone for a week then decrease to 200 mg twice a day for month, then decrease to 200 mg daily thereafter. TSH was normal. However laboratory test obtained a week later showed her renal function deteriorated slightly, therefore I instructed her to hold diuretic until today's visit.    She presents today for follow-up, she did take a dose of torsemide yesterday morning, however she has not had any torsemide today. I did ask her to hold torsemide for today. She continued to have 2+ pitting edema in the lower extremity and feels very fatigued. EKG shows she remained in atrial fibrillation with RVR with heart rate of 133. Prior to her visit, I did discuss the case with her primary cardiologist Dr. Debara Pickett. We have reviewed her record and she is very unlikely to remain in  sinus rhythm without amiodarone. She will do very poorly if she remains in atrial fibrillation. After 2 weeks of amiodarone, we plan to cardiovert her as outpatient. She started on amiodarone last week and therefore we will arrange outpatient DC cardioversion later next week after 2 weeks of loading process. If she is unable to maintain sinus rhythm afterward, we will need to refer her to electrophysiology service. Recent echocardiogram is quite concerning as her ejection  fraction and RV function seems to be declining. She also have lower extremity edema, however aggressive diuresis would only worsen her renal function. I think this is related to loss of atrial kick, tachycardia and RV dysfunction noted on TEE. I think she would be more amenable for diuresis once she converted to sinus rhythm. As long as she stays in atrial fibrillation, I suspect, she will continue to gain fluid. She will resume torsemide 5 mg daily tomorrow.   Past Medical History:  Diagnosis Date  . Anxiety   . Chronic lower back pain   . Depression   . Fatigue   . GERD (gastroesophageal reflux disease)   . Heart murmur   . High cholesterol   . Hypertension   . Hypothyroidism   . Melanoma of shoulder, left ([place])   . Mild aortic stenosis   . OSA on CPAP    "I wear pillows" (11/15/2016)  . Osteoarthritis   . Palpitations   . Persistent atrial fibrillation with RVR ([place])    [name] 11/15/2016  . RLS (restless legs syndrome)   . Type II diabetes mellitus ([place])     Past Surgical History:  Procedure Laterality Date  . CARDIOVERSION N/A 11/19/2016   Procedure: CARDIOVERSION;  Surgeon: Lelon Perla, MD;  Location: [facility] ENDOSCOPY;  Service: Cardiovascular;  Laterality: N/A;  . CATARACT EXTRACTION W/ INTRAOCULAR LENS  IMPLANT, BILATERAL Bilateral   . DILATION AND CURETTAGE OF UTERUS    . LEXISCAN MYOVIEW  05/13/2012   No ECG changes. EKG negative for ischemia. No significant ischemia demonstrated.  [name] MELANOMA EXCISION Left ~ 09/1979   "back of my shoulder"  . TEE WITHOUT CARDIOVERSION N/A 11/19/2016   Procedure: TRANSESOPHAGEAL ECHOCARDIOGRAM (TEE);  Surgeon: Lelon Perla, MD;  Location: [facility] ENDOSCOPY;  Service: Cardiovascular;  Laterality: N/A;  need CV, but no anesthesia available  . TRANSTHORACIC ECHOCARDIOGRAM  02/18/2012   EF >55%, mild aortic stenosis  . VAGINAL HYSTERECTOMY  1985    Current Medications: Outpatient Medications Prior to Visit  Medication Sig Dispense Refill  .  acarbose (PRECOSE) 25 MG tablet Take 25 mg by mouth 3 (three) times daily.      [name] acetaminophen (TYLENOL) 500 MG tablet Take 500-1,000 mg by mouth every 6 (six) hours as needed for mild pain, moderate pain or headache.    . ALPRAZolam (XANAX) 0.5 MG tablet Take 1 tablet by mouth 2 (two) times daily.    [name] amiodarone (PACERONE) 200 MG tablet Take 1-2 tablets (200-400 mg total) by mouth as directed. 100 tablet 3  . apixaban (ELIQUIS) 5 MG TABS tablet Take 1 tablet (5 mg total) by mouth 2 (two) times daily. 60 tablet 5  . atorvastatin (LIPITOR) 40 MG tablet TAKE 1 TABLET (40 MG TOTAL) BY MOUTH DAILY. 30 tablet 11  . buPROPion (WELLBUTRIN XL) 150 MG 24 hr tablet TAKE ONE TABLET (150 MG TOTAL) BY MOUTH TWICE DAILY    . Calcium Carbonate-Vitamin D (CALCIUM + D PO) Take 600 mg by mouth 2 (two) times daily.     [name]  CHERRY PO Take by mouth. Drink 1/2 cup before breakfast and 1/2 cup before supper    . Ginger, Zingiber officinalis, (GINGER PO) Take 1 capsule by mouth 2 (two) times daily.     [name] levothyroxine (SYNTHROID, LEVOTHROID) 88 MCG tablet Take 88 mcg by mouth daily before breakfast.    . Magnesium 250 MG TABS Take 2 tablets by mouth daily.     . meclizine (ANTIVERT) 25 MG tablet Take 1 tablet (25 mg total) by mouth 3 (three) times daily as needed for dizziness. 30 tablet 0  . Misc. Devices MISC by Does not apply route at bedtime. CPAP    . Multiple Vitamins-Minerals (PRESERVISION AREDS 2) CAPS Take 1 capsule by mouth 2 (two) times daily.    . Omega-3 Fatty Acids (FISH OIL) 1200 MG CAPS Take 1 capsule by mouth 2 (two) times daily. Take 1 at breakfast and 1 at supper    . omeprazole (PRILOSEC) 40 MG capsule TAKE ONE CAPSULE BY MOUTH EVERY DAY    . potassium chloride SA (KLOR-CON M20) 20 MEQ tablet Take 1 tablet by mouth 2 (two) times daily.    [name] rOPINIRole (REQUIP) 0.5 MG tablet Take 2-3 daily at bedtime as needed (Patient taking differently: Take 0.5-1 mg by mouth 2 (two) times daily. 0.5 mg by mouth  midday then 0.5-1 mg at bedtime) 90 tablet 3  . traMADol (ULTRAM) 50 MG tablet Take 50 mg by mouth at bedtime.     . metoprolol tartrate (LOPRESSOR) 25 MG tablet Take 1 tablet (25 mg total) by mouth 2 (two) times daily. 180 tablet 3  . torsemide (DEMADEX) 5 MG tablet Take 5-15 mg by mouth daily as needed (FOR FLUID).     . valsartan (DIOVAN) 160 MG tablet Take 0.5 tablets (80 mg total) by mouth daily. 30 tablet 3   No facility-administered medications prior to visit.      Allergies:   Amoxicillin; Crestor [rosuvastatin]; and Lovastatin   Social History   Social History  . Marital status: Widowed    Spouse name: N/A  . Number of children: N/A  . Years of education: N/A   Social History Main Topics  . Smoking status: Never Smoker  . Smokeless tobacco: Never Used  . Alcohol use No  . Drug use: No  . Sexual activity: No   Other Topics Concern  . None   Social History Narrative  . None     Family History:  The patient's family history includes Arrhythmia in her sister; Cancer in her paternal grandfather; Diabetes in her mother; Healthy in her brother and daughter; Heart failure in her father; Hyperlipidemia in her sister and son; Hypertension in her mother, sister, and son; Stroke in her paternal grandmother.   ROS:   Please see the history of present illness.    ROS All other systems reviewed and are negative.   PHYSICAL EXAM:   VS:  BP 122/84   Pulse (!) 133   Ht 5' 5.5" (1.664 m)   Wt 228 lb 3.2 oz (103.5 kg)   BMI 37.40 kg/m    GEN: Well nourished, well developed, in no acute distress  HEENT: normal  Neck: no JVD, carotid bruits, or masses Cardiac: Tachycardic regular; no murmurs, rubs, or gallops 2+ pitting edema  Respiratory:  clear to auscultation bilaterally, normal work of breathing GI: soft, nontender, nondistended, + BS MS: no deformity or atrophy  Skin: warm and dry, no rash Neuro:  Alert and Oriented x 3, Strength and  sensation are intact Psych:  euthymic mood, full affect  Wt Readings from Last 3 Encounters:  12/04/16 228 lb 3.2 oz (103.5 kg)  11/27/16 225 lb (102.1 kg)  11/20/16 222 lb 10.6 oz (101 kg)      Studies/Labs Reviewed:   EKG:  EKG is ordered today.  The ekg ordered today demonstrates Atrial fibrillation with heart rate 133.  Recent Labs: 11/16/2016: ALT 79 11/18/2016: Hemoglobin 11.5; Platelets 376 12/02/2016: BUN 32; Creat 1.49; Potassium 4.9; Sodium 133; TSH 3.04   Lipid Panel No results found for: CHOL, TRIG, HDL, CHOLHDL, VLDL, LDLCALC, LDLDIRECT  Additional studies/ records that were reviewed today include:   myoview 8/7//2013    Echo 02/18/2012    Echo 11/16/2016  LV EF: 35% - 40% --------------------------------------- Study Conclusions  - Left ventricle: The cavity size was mildly dilated. Wall thickness was normal. Systolic function was moderately reduced. The estimated ejection fraction was in the range of 35% to 40%. - Aortic valve: There was mild stenosis. Valve area (VTI): 0.71 cm^2. Valve area (Vmax): 0.7 cm^2. Valve area (Vmean): 0.67 cm^2. - Mitral valve: There was mild to moderate regurgitation. - Tricuspid valve: There was moderate regurgitation.   TEE 11/19/2016 LV EF: 20% -   25%  ------------------------------------------------------------------- Indications:      Atrial fibrillation - 427.31.  ------------------------------------------------------------------- History:   PMH:   Murmur.  Aortic valve disease.  Risk factors: Hypertension. Diabetes mellitus.  ------------------------------------------------------------------- Study Conclusions  - Left ventricle: The cavity size was dilated. Systolic function   was severely reduced. The estimated ejection fraction was in the   range of 20% to 25%. Diffuse hypokinesis. - Aortic valve: Valve mobility was restricted. No evidence of   vegetation. There was mild regurgitation. - Mitral valve: No evidence of  vegetation. There was mild to   moderate regurgitation. - Left atrium: The atrium was moderately dilated. No evidence of   thrombus in the atrial cavity or appendage. - Right ventricle: The cavity size was mildly dilated. Systolic   function was severely reduced. - Right atrium: The atrium was moderately dilated. - Atrial septum: No defect or patent foramen ovale was identified. - Tricuspid valve: No evidence of vegetation. There was moderate   regurgitation. - Pulmonic valve: No evidence of vegetation.  Impressions:  - Severe global reduction in LV function; 4 chamber enlargement; No   LAA thrombus; severely reduced RV function; calcified aortic   valve with reduced cusp excursion; mild AI; mild to moderate MR;   moderate TR.   ASSESSMENT:    1. Persistent atrial fibrillation ([place])   2. Medication management   3. Essential hypertension   4. Hypothyroidism, unspecified type   5. Controlled type 2 diabetes mellitus with complication, without long-term current use of insulin ([place])   6. OSA on CPAP   7. Acute on chronic systolic heart failure ([facility])      PLAN:  In order of problems listed above:  1. Persistent atrial fibrillation with RVR:  - I started loading her with amiodarone last Wednesday, unfortunately she is still in atrial fibrillation with RVR with heart rate of 130s today. For some reason, she has stopped her metoprolol as well, I encouraged her to restart metoprolol. I do not think she will maintain sinus rhythm without amiodarone at this point.  - She is very much symptomatic and does not tolerate atrial fibrillation very well. She has significant fatigue. Echocardiogram shows her ejection fraction is decreasing. She also have significant heart failure symptoms as well  secondary to RV dysfunction and loss of atrial kick.  - I have discussed with her primary cardiologist, Dr. Lysbeth Penner this morning. We believe she will need to come out of atrial fibrillation as soon as  she finished 2 weeks loading period of amiodarone. I will set her up next Thursday for outpatient DC cardioversion with Dr. Debara Pickett. If she is unable to maintain sinus rhythm afterward, she will need EP referral.  2. Acute on chronic systolic heart failure  - Likely nonischemic in setting of atrial fibrillation with RVR. Concerning echocardiogram and TEE report shows her EF is declining. She still appears to be volume overloaded on physical exam. Unfortunately with aggressive diuresis, her renal function deteriorated. I asked her to hold her torsemide today and started 5 mg tomorrow. Unfortunately without cardioversion, she will continue to gain fluid even with diuretic.  - I did temporarily hold her valsartan last week in an effort to create some blood pressure room for rate control. She will restart metoprolol today. Once her atrial fibrillation issue is fixed, I plan to restart ARB or even consider Entrasto. Delene Loll may maximize her heart failure benefit. She will also need spironolactone was atrial fibrillation is under control.  3. Hypertension: Blood pressure normal today, however she took herself off of metoprolol, I will restart today. She will need to rate control medication.  4. Hypothyroidism: She is on Synthroid with  5. DM 2: Deferred to PCP   Medication Adjustments/Labs and Tests Ordered: Current medicines are reviewed at length with the patient today.  Concerns regarding medicines are outlined above.  Medication changes, Labs and Tests ordered today are listed in the Patient Instructions below. Patient Instructions  Medication Instructions:  RESTART Metoprolol 25mg  Take 1 tablet twice a day HOLD Torsemide for TODAY RESTART Torsemide Tomorrow Take 1 5mg  tablet once a day   Labwork: Your physician recommends that you return for lab work in: COMPLETE IN 1 WEEK (12/10/2016) BMP  Testing/Procedures: Your physician has recommended that you have a Cardioversion (DCCV). Electrical  Cardioversion uses a jolt of electricity to your heart either through paddles or wired patches attached to your chest. This is a controlled, usually prescheduled, procedure. Defibrillation is done under light anesthesia in the hospital, and you usually go home the day of the procedure. This is done to get your heart back into a normal rhythm. You are not awake for the procedure. Please see the instruction sheet given to you today.  SCHEDULE  Cardioversion on March 9th with DR HILTY.  Follow-Up: Your physician recommends that you schedule a follow-up appointment in: 2 [place], [place]   Any Other Special Instructions Will Be Listed Below (If Applicable).     If you need a refill on your cardiac medications before your next appointment, please call your pharmacy.     Hilbert Corrigan, [place]  12/04/2016 [facility] [address], [place], [place]  [address] Phone: [phone]; Fax: [phone]

## 2016-12-04 NOTE — Patient Instructions (Signed)
Medication Instructions:  RESTART Metoprolol 25mg  Take 1 tablet twice a day HOLD Torsemide for TODAY RESTART Torsemide Tomorrow Take 1 5mg  tablet once a day   Labwork: Your physician recommends that you return for lab work in: COMPLETE IN 1 WEEK (12/10/2016) BMP  Testing/Procedures: Your physician has recommended that you have a Cardioversion (DCCV). Electrical Cardioversion uses a jolt of electricity to your heart either through paddles or wired patches attached to your chest. This is a controlled, usually prescheduled, procedure. Defibrillation is done under light anesthesia in the hospital, and you usually go home the day of the procedure. This is done to get your heart back into a normal rhythm. You are not awake for the procedure. Please see the instruction sheet given to you today.  SCHEDULE  Cardioversion on March 9th with DR HILTY.  Follow-Up: Your physician recommends that you schedule a follow-up appointment in: 2 [place], [place]   Any Other Special Instructions Will Be Listed Below (If Applicable).     If you need a refill on your cardiac medications before your next appointment, please call your pharmacy.

## 2016-12-06 ENCOUNTER — Telehealth: Payer: Self-pay | Admitting: Internal Medicine

## 2016-12-06 NOTE — Telephone Encounter (Signed)
New Message   Pt daughter calling to see if cardioversion can be rescheduled for a sooner appt. Requesting call back

## 2016-12-07 ENCOUNTER — Encounter (HOSPITAL_COMMUNITY): Payer: Self-pay | Admitting: Emergency Medicine

## 2016-12-07 ENCOUNTER — Inpatient Hospital Stay (HOSPITAL_COMMUNITY)
Admission: EM | Admit: 2016-12-07 | Discharge: 2016-12-20 | DRG: 291 | Disposition: A | Discharge status: Skilled Nursing Facility | Payer: Medicare Other | Attending: Internal Medicine | Admitting: Internal Medicine

## 2016-12-07 ENCOUNTER — Emergency Department (HOSPITAL_COMMUNITY): Payer: Medicare Other

## 2016-12-07 DIAGNOSIS — I130 Hypertensive heart and chronic kidney disease with heart failure and stage 1 through stage 4 chronic kidney disease, or unspecified chronic kidney disease: Principal | ICD-10-CM | POA: Diagnosis present

## 2016-12-07 DIAGNOSIS — Z823 Family history of stroke: Secondary | ICD-10-CM

## 2016-12-07 DIAGNOSIS — R0609 Other forms of dyspnea: Secondary | ICD-10-CM

## 2016-12-07 DIAGNOSIS — I481 Persistent atrial fibrillation: Secondary | ICD-10-CM | POA: Diagnosis present

## 2016-12-07 DIAGNOSIS — E039 Hypothyroidism, unspecified: Secondary | ICD-10-CM | POA: Diagnosis present

## 2016-12-07 DIAGNOSIS — R0602 Shortness of breath: Secondary | ICD-10-CM | POA: Diagnosis not present

## 2016-12-07 DIAGNOSIS — I350 Nonrheumatic aortic (valve) stenosis: Secondary | ICD-10-CM

## 2016-12-07 DIAGNOSIS — I10 Essential (primary) hypertension: Secondary | ICD-10-CM | POA: Diagnosis present

## 2016-12-07 DIAGNOSIS — G4733 Obstructive sleep apnea (adult) (pediatric): Secondary | ICD-10-CM | POA: Diagnosis present

## 2016-12-07 DIAGNOSIS — R739 Hyperglycemia, unspecified: Secondary | ICD-10-CM

## 2016-12-07 DIAGNOSIS — Z7984 Long term (current) use of oral hypoglycemic drugs: Secondary | ICD-10-CM

## 2016-12-07 DIAGNOSIS — I4819 Other persistent atrial fibrillation: Secondary | ICD-10-CM | POA: Diagnosis present

## 2016-12-07 DIAGNOSIS — E7800 Pure hypercholesterolemia, unspecified: Secondary | ICD-10-CM | POA: Diagnosis present

## 2016-12-07 DIAGNOSIS — I5043 Acute on chronic combined systolic (congestive) and diastolic (congestive) heart failure: Secondary | ICD-10-CM | POA: Diagnosis present

## 2016-12-07 DIAGNOSIS — N189 Chronic kidney disease, unspecified: Secondary | ICD-10-CM

## 2016-12-07 DIAGNOSIS — K440 Diaphragmatic hernia with obstruction, without gangrene: Secondary | ICD-10-CM

## 2016-12-07 DIAGNOSIS — R112 Nausea with vomiting, unspecified: Secondary | ICD-10-CM

## 2016-12-07 DIAGNOSIS — K219 Gastro-esophageal reflux disease without esophagitis: Secondary | ICD-10-CM | POA: Diagnosis present

## 2016-12-07 DIAGNOSIS — I429 Cardiomyopathy, unspecified: Secondary | ICD-10-CM | POA: Diagnosis present

## 2016-12-07 DIAGNOSIS — E875 Hyperkalemia: Secondary | ICD-10-CM

## 2016-12-07 DIAGNOSIS — R7989 Other specified abnormal findings of blood chemistry: Secondary | ICD-10-CM

## 2016-12-07 DIAGNOSIS — E1122 Type 2 diabetes mellitus with diabetic chronic kidney disease: Secondary | ICD-10-CM | POA: Diagnosis present

## 2016-12-07 DIAGNOSIS — R945 Abnormal results of liver function studies: Secondary | ICD-10-CM

## 2016-12-07 DIAGNOSIS — Z833 Family history of diabetes mellitus: Secondary | ICD-10-CM

## 2016-12-07 DIAGNOSIS — E871 Hypo-osmolality and hyponatremia: Secondary | ICD-10-CM | POA: Diagnosis present

## 2016-12-07 DIAGNOSIS — Z7901 Long term (current) use of anticoagulants: Secondary | ICD-10-CM

## 2016-12-07 DIAGNOSIS — E1165 Type 2 diabetes mellitus with hyperglycemia: Secondary | ICD-10-CM | POA: Diagnosis present

## 2016-12-07 DIAGNOSIS — I959 Hypotension, unspecified: Secondary | ICD-10-CM | POA: Diagnosis present

## 2016-12-07 DIAGNOSIS — N179 Acute kidney failure, unspecified: Secondary | ICD-10-CM

## 2016-12-07 DIAGNOSIS — E1121 Type 2 diabetes mellitus with diabetic nephropathy: Secondary | ICD-10-CM | POA: Diagnosis present

## 2016-12-07 DIAGNOSIS — R531 Weakness: Secondary | ICD-10-CM

## 2016-12-07 DIAGNOSIS — Z6838 Body mass index (BMI) 38.0-38.9, adult: Secondary | ICD-10-CM

## 2016-12-07 DIAGNOSIS — E878 Other disorders of electrolyte and fluid balance, not elsewhere classified: Secondary | ICD-10-CM | POA: Diagnosis present

## 2016-12-07 DIAGNOSIS — R0600 Dyspnea, unspecified: Secondary | ICD-10-CM

## 2016-12-07 DIAGNOSIS — K5900 Constipation, unspecified: Secondary | ICD-10-CM | POA: Diagnosis not present

## 2016-12-07 DIAGNOSIS — N183 Chronic kidney disease, stage 3 (moderate): Secondary | ICD-10-CM | POA: Diagnosis present

## 2016-12-07 DIAGNOSIS — D649 Anemia, unspecified: Secondary | ICD-10-CM | POA: Diagnosis present

## 2016-12-07 DIAGNOSIS — Z8249 Family history of ischemic heart disease and other diseases of the circulatory system: Secondary | ICD-10-CM

## 2016-12-07 DIAGNOSIS — I5023 Acute on chronic systolic (congestive) heart failure: Secondary | ICD-10-CM | POA: Diagnosis present

## 2016-12-07 DIAGNOSIS — Z85820 Personal history of malignant melanoma of skin: Secondary | ICD-10-CM

## 2016-12-07 DIAGNOSIS — Z90710 Acquired absence of both cervix and uterus: Secondary | ICD-10-CM

## 2016-12-07 DIAGNOSIS — G2581 Restless legs syndrome: Secondary | ICD-10-CM | POA: Diagnosis present

## 2016-12-07 LAB — CBC WITH DIFFERENTIAL/PLATELET
Basophils Absolute: 0 10*3/uL (ref 0.0–0.1)
Basophils Relative: 0 %
Eosinophils Absolute: 0.1 10*3/uL (ref 0.0–0.7)
Eosinophils Relative: 1 %
HCT: 37.1 % (ref 36.0–46.0)
Hemoglobin: 12 g/dL (ref 12.0–15.0)
Lymphocytes Relative: 21 %
Lymphs Abs: 2.3 10*3/uL (ref 0.7–4.0)
MCH: 26.1 pg (ref 26.0–34.0)
MCHC: 32.3 g/dL (ref 30.0–36.0)
MCV: 80.8 fL (ref 78.0–100.0)
Monocytes Absolute: 1.5 10*3/uL — ABNORMAL HIGH (ref 0.1–1.0)
Monocytes Relative: 13 %
Neutro Abs: 7 10*3/uL (ref 1.7–7.7)
Neutrophils Relative %: 65 %
Platelets: 458 10*3/uL — ABNORMAL HIGH (ref 150–400)
RBC: 4.59 MIL/uL (ref 3.87–5.11)
RDW: 14.7 % (ref 11.5–15.5)
WBC: 10.9 10*3/uL — ABNORMAL HIGH (ref 4.0–10.5)

## 2016-12-07 LAB — BASIC METABOLIC PANEL
Anion gap: 12 (ref 5–15)
BUN: 43 mg/dL — ABNORMAL HIGH (ref 6–20)
CO2: 21 mmol/L — ABNORMAL LOW (ref 22–32)
Calcium: 8.9 mg/dL (ref 8.9–10.3)
Chloride: 93 mmol/L — ABNORMAL LOW (ref 101–111)
Creatinine, Ser: 1.87 mg/dL — ABNORMAL HIGH (ref 0.44–1.00)
GFR calc Af Amer: 29 mL/min — ABNORMAL LOW (ref >60)
GFR calc non Af Amer: 25 mL/min — ABNORMAL LOW (ref >60)
Glucose, Bld: 81 mg/dL (ref 65–99)
Potassium: 6.1 mmol/L — ABNORMAL HIGH (ref 3.5–5.1)
Sodium: 126 mmol/L — ABNORMAL LOW (ref 135–145)

## 2016-12-07 LAB — BRAIN NATRIURETIC PEPTIDE: B Natriuretic Peptide: 1423.9 pg/mL — ABNORMAL HIGH (ref 0.0–100.0)

## 2016-12-07 LAB — TROPONIN I: Troponin I: <0.03 ng/mL (ref <0.03)

## 2016-12-07 MED ORDER — FUROSEMIDE 10 MG/ML IJ SOLN
40.0000 mg | Freq: Once | INTRAMUSCULAR | Status: AC
  Administered 2016-12-07: 40 mg via INTRAVENOUS
  Filled 2016-12-07: qty 4

## 2016-12-07 MED ORDER — SODIUM POLYSTYRENE SULFONATE 15 GM/60ML PO SUSP
15.0000 g | Freq: Once | ORAL | Status: AC
  Administered 2016-12-07: 15 g via ORAL
  Filled 2016-12-07: qty 60

## 2016-12-07 MED ORDER — ONDANSETRON HCL 4 MG/2ML IJ SOLN
4.0000 mg | Freq: Once | INTRAMUSCULAR | Status: AC
  Administered 2016-12-07: 4 mg via INTRAVENOUS
  Filled 2016-12-07: qty 2

## 2016-12-07 NOTE — ED Triage Notes (Signed)
Patient arrived to ED via [facility] from home. EMS reports: Patient c/o generalized weakness, malaise, shortness of breath w/exertion, and burning with urination x 4 days. Patient had been admitted 2/9-2/14. Dc'd 11/20/16. Cardioversion performed on 11/19/16. Next cardioversion scheduled this coming Friday.  Patient denies pain.  AF on monitor.  BP 112/49, 110 irregular pulse, 100% on room air. CBG 98.

## 2016-12-07 NOTE — ED Provider Notes (Addendum)
[facility] DEPT Provider Note   CSN: [id number] Arrival date & time: 12/07/16  2036     History   Chief Complaint Chief Complaint  Patient presents with  . Weakness  . Dysuria  . Shortness of Breath    HPI Jenna [name] is a 76 y.o. female.  Patient is here for generalized fatigue and dyspnea on exertion, ongoing since recent hospitalization about 4 weeks ago.  She saw her cardiology provider 5 days ago at that time had similar symptoms.  Over the last 10 days she has been modifying diuretic therapy, to balance edema renal function and dyspnea, at the suggestion of her cardiology provider.  The patient feels like her weight is increasing and that she is now about 260 pounds.  However she does not follow the weight closely.  She has been able to eat and drink.  She sometimes gets nauseated.  There has been no vomiting, diarrhea, dysuria, or urinary incontinence.  She denies chest pain.  She is in atrial fibrillation and has failed recent cardioversion.  She was placed on amiodarone in preparation for another cardioversion planned for a couple of weeks from now.  Amiodarone was started on 11/27/2016.  She is on Eliquis.  There are no other known modifying factors.   HPI  Past Medical History:  Diagnosis Date  . Anxiety   . Chronic lower back pain   . Depression   . Fatigue   . GERD (gastroesophageal reflux disease)   . Heart murmur   . High cholesterol   . Hypertension   . Hypothyroidism   . Melanoma of shoulder, left ([place])   . Mild aortic stenosis   . OSA on CPAP    "I wear pillows" (11/15/2016)  . Osteoarthritis   . Palpitations   . Persistent atrial fibrillation with RVR ([place])    [name] 11/15/2016  . RLS (restless legs syndrome)   . Type II diabetes mellitus [facility])     Patient Active Problem List   Diagnosis Date Noted  . Persistent atrial fibrillation ([place]) 11/15/2016  . Depressed 07/04/2015  . Peripheral vertigo 12/16/2014  . Vertigo 12/16/2014  . Aortic stenosis,  mild 07/26/2013  . HTN (hypertension) 07/26/2013  . Hypothyroidism 07/26/2013  . PAF (paroxysmal atrial fibrillation) ([place]) 07/26/2013  . Obstructive sleep apnea 06/20/2008  . RESTLESS LEGS SYNDROME 06/20/2008    Past Surgical History:  Procedure Laterality Date  . CARDIOVERSION N/A 11/19/2016   Procedure: CARDIOVERSION;  Surgeon: Lelon Perla, MD;  Location: [facility] ENDOSCOPY;  Service: Cardiovascular;  Laterality: N/A;  . CATARACT EXTRACTION W/ INTRAOCULAR LENS  IMPLANT, BILATERAL Bilateral   . DILATION AND CURETTAGE OF UTERUS    . LEXISCAN MYOVIEW  05/13/2012   No ECG changes. EKG negative for ischemia. No significant ischemia demonstrated.  [name] MELANOMA EXCISION Left ~ 09/1979   "back of my shoulder"  . TEE WITHOUT CARDIOVERSION N/A 11/19/2016   Procedure: TRANSESOPHAGEAL ECHOCARDIOGRAM (TEE);  Surgeon: Lelon Perla, MD;  Location: [facility] ENDOSCOPY;  Service: Cardiovascular;  Laterality: N/A;  need CV, but no anesthesia available  . TRANSTHORACIC ECHOCARDIOGRAM  02/18/2012   EF >55%, mild aortic stenosis  . VAGINAL HYSTERECTOMY  1985    OB History    No data available       Home Medications    Prior to Admission medications   Medication Sig Start Date End Date Taking? Authorizing Provider  acarbose (PRECOSE) 25 MG tablet Take 25 mg by mouth 3 (three) times daily.  Historical Provider, MD  acetaminophen (TYLENOL) 500 MG tablet Take 500-1,000 mg by mouth every 6 (six) hours as needed for mild pain, moderate pain or headache.    Historical Provider, MD  ALPRAZolam Duanne Moron) 0.5 MG tablet Take 1 tablet by mouth 2 (two) times daily. 07/05/16   Historical Provider, MD  amiodarone (PACERONE) 200 MG tablet Take 1-2 tablets (200-400 mg total) by mouth as directed. 11/27/16   Almyra Deforest, PA  apixaban (ELIQUIS) 5 MG TABS tablet Take 1 tablet (5 mg total) by mouth 2 (two) times daily. 11/20/16   Daune Perch, NP  atorvastatin (LIPITOR) 40 MG tablet TAKE 1 TABLET (40 MG TOTAL) BY MOUTH DAILY.  10/18/16   [name], MD  buPROPion (WELLBUTRIN XL) 150 MG 24 hr tablet TAKE ONE TABLET (150 MG TOTAL) BY MOUTH TWICE DAILY 08/07/15   Historical Provider, MD  Calcium Carbonate-Vitamin D (CALCIUM + D PO) Take 600 mg by mouth 2 (two) times daily.     Historical Provider, MD  CHERRY PO Take by mouth. Drink 1/2 cup before breakfast and 1/2 cup before supper    Historical Provider, MD  Ginger, Zingiber officinalis, (GINGER PO) Take 1 capsule by mouth 2 (two) times daily.     Historical Provider, MD  levothyroxine (SYNTHROID, LEVOTHROID) 88 MCG tablet Take 88 mcg by mouth daily before breakfast.    Historical Provider, MD  Magnesium 250 MG TABS Take 2 tablets by mouth daily.     Historical Provider, MD  meclizine (ANTIVERT) 25 MG tablet Take 1 tablet (25 mg total) by mouth 3 (three) times daily as needed for dizziness. 03/26/16   Pattricia Boss, MD  metoprolol tartrate (LOPRESSOR) 25 MG tablet Take 1 tablet (25 mg total) by mouth 2 (two) times daily. 12/04/16   Almyra Deforest, [place]. Devices MISC by Does not apply route at bedtime. CPAP    Historical Provider, MD  Multiple Vitamins-Minerals (PRESERVISION AREDS 2) CAPS Take 1 capsule by mouth 2 (two) times daily.    Historical Provider, MD  Omega-3 Fatty Acids (FISH OIL) 1200 MG CAPS Take 1 capsule by mouth 2 (two) times daily. Take 1 at breakfast and 1 at supper    Historical Provider, MD  omeprazole (PRILOSEC) 40 MG capsule TAKE ONE CAPSULE BY MOUTH EVERY DAY 07/31/15   Historical Provider, MD  potassium chloride SA (KLOR-CON M20) 20 MEQ tablet Take 1 tablet by mouth 2 (two) times daily. 06/20/16   Historical Provider, MD  rOPINIRole (REQUIP) 0.5 MG tablet Take 2-3 daily at bedtime as needed Patient taking differently: Take 0.5-1 mg by mouth 2 (two) times daily. 0.5 mg by mouth midday then 0.5-1 mg at bedtime 09/20/14   Kathee Delton, MD  torsemide (DEMADEX) 5 MG tablet Take 1 tablet (5 mg total) by mouth daily. 12/04/16   Almyra Deforest, PA  traMADol (ULTRAM)  50 MG tablet Take 50 mg by mouth at bedtime.     Historical Provider, MD    Family History Family History  Problem Relation Age of Onset  . Hypertension Mother   . Diabetes Mother   . Heart failure Father   . Arrhythmia Sister   . Hyperlipidemia Sister   . Hypertension Sister   . Healthy Brother   . Stroke Paternal Grandmother   . Cancer Paternal Grandfather   . Healthy Daughter   . Hypertension Son   . Hyperlipidemia Son     Social History Social History  Substance Use Topics  . Smoking status: Never  Smoker  . Smokeless tobacco: Never Used  . Alcohol use No     Allergies   Amoxicillin; Crestor [rosuvastatin]; and Lovastatin   Review of Systems Review of Systems  All other systems reviewed and are negative.    Physical Exam Updated Vital Signs BP 112/83   Pulse (!) 45   Temp 97.5 F (36.4 C) (Oral)   Resp 19   Ht 5' 5.5" (1.664 m)   Wt 232 lb 5.8 oz (105.4 kg)   SpO2 (!) 83%   BMI 38.08 kg/m   Physical Exam  Constitutional: She is oriented to person, place, and time. She appears well-developed.  Obese  HENT:  Head: Normocephalic and atraumatic.  Eyes: Conjunctivae and EOM are normal. Pupils are equal, round, and reactive to light.  Neck: Normal range of motion and phonation normal. Neck supple.  Cardiovascular: Normal rate and regular rhythm.   Pulmonary/Chest: Effort normal and breath sounds normal. No respiratory distress. She has no wheezes. She exhibits no tenderness.  Abdominal: Soft. She exhibits no distension. There is no tenderness. There is no guarding.  Musculoskeletal: Normal range of motion. She exhibits edema (2+ bilateral lower legs).  Neurological: She is alert and oriented to person, place, and time. She exhibits normal muscle tone.  Skin: Skin is warm and dry.  Psychiatric: She has a normal mood and affect. Her behavior is normal. Judgment and thought content normal.  Nursing note and vitals reviewed.    ED Treatments / Results    Labs (all labs ordered are listed, but only abnormal results are displayed) Labs Reviewed  BRAIN NATRIURETIC PEPTIDE - Abnormal; Notable for the following:       Result Value   B Natriuretic Peptide 1,423.9 (*)    All other components within normal limits  BASIC METABOLIC PANEL - Abnormal; Notable for the following:    Sodium 126 (*)    Potassium 6.1 (*)    Chloride 93 (*)    CO2 21 (*)    BUN 43 (*)    Creatinine, Ser 1.87 (*)    GFR calc non Af Amer 25 (*)    GFR calc Af Amer 29 (*)    All other components within normal limits  CBC WITH DIFFERENTIAL/PLATELET - Abnormal; Notable for the following:    WBC 10.9 (*)    Platelets 458 (*)    Monocytes Absolute 1.5 (*)    All other components within normal limits  TROPONIN I  URINALYSIS, ROUTINE W REFLEX MICROSCOPIC  NA AND K (SODIUM & POTASSIUM), RAND UR    EKG  EKG Interpretation  Date/Time:  Saturday December 07 2016 23:39:49 EST Ventricular Rate:  103 PR Interval:    QRS Duration: 118 QT Interval:  380 QTC Calculation: 498 R Axis:   -129 Text Interpretation:  Atrial fibrillation Incomplete right bundle branch block Anterior infarct, old Confirmed by Eulis Foster  MD, Gaven Eugene ([id number]) on 12/07/2016 11:46:13 PM       CHA2DS2/VAS Stroke Risk Points      5 >= 2 Points: High Risk  1 - 1.99 Points: Medium Risk  0 Points: Low Risk    The previous score was 4 on 11/27/2016.:  Change:         Details    Note: External data might be a factor in metrics not marked with    Points Metrics   This score determines the patient's risk of having a stroke if the  patient has atrial fibrillation.  1 Has Congestive Heart Failure:  Yes   0 Has Vascular Disease:  No   1 Has Hypertension:  Yes   1 Age:  40   1 Has Diabetes:  Yes   0 Had Stroke:  No Had TIA:  No Had thromboembolism:  No   1 Female:  Yes          Radiology Dg Chest Port 1 View  Result Date: 12/07/2016 CLINICAL DATA:  Shortness of breath and weakness EXAM: PORTABLE  CHEST 1 VIEW COMPARISON:  October 07, 2004 FINDINGS: The study is limited due to the low volume portable technique. Probable atelectasis in the left base. Stable cardiomegaly. No other interval changes or acute abnormalities. IMPRESSION: Limited study to technique. Mild left basilar atelectasis. No other acute abnormalities. Electronically Signed   By: Dorise Bullion III M.D   On: 12/07/2016 22:21    Procedures Procedures (including critical care time)  Medications Ordered in ED Medications  furosemide (LASIX) injection 40 mg (40 mg Intravenous Given 12/07/16 2344)  sodium polystyrene (KAYEXALATE) 15 GM/60ML suspension 15 g (15 g Oral Given 12/07/16 2345)  ondansetron (ZOFRAN) injection 4 mg (4 mg Intravenous Given 12/07/16 2344)     Initial Impression / Assessment and Plan / ED Course  I have reviewed the triage vital signs and the nursing notes.  Pertinent labs & imaging results that were available during my care of the patient were reviewed by me and considered in my medical decision making (see chart for details).  Clinical Course as of Mar 04 0001  Sat Dec 07, 2016  2322 Weight today is 4 kilograms higher than at hospital discharge on 11/27/2016.  [EW]  2322 Elevated Potassium: (!) 6.1 [EW]  2322 Low Chloride: (!) 93 [EW]  2322 Low Sodium: (!) 126 [EW]  2323 Elevated Creatinine: (!) 1.87 [EW]  2323 Elevated B Natriuretic Peptide: (!) 1,423.9 [EW]  2323 Normal Troponin I: <0.03 [EW]  Sun Dec 08, 2016  0000 Discussion with cardiology who will admit the patient for management.  [EW]    Clinical Course User Index [EW] Daleen Bo, MD    BUN  Date Value Ref Range Status  12/07/2016 43 (H) 6 - 20 mg/dL Final  12/02/2016 32 (H) 7 - 25 mg/dL Final  11/18/2016 26 (H) 6 - 20 mg/dL Final  11/17/2016 28 (H) 6 - 20 mg/dL Final   Creat  Date Value Ref Range Status  12/02/2016 1.49 (H) 0.60 - 0.93 mg/dL Final    Comment:      For patients > or = 75 years of age: The upper reference  limit for Creatinine is approximately 13% higher for people identified as African-American.      Creatinine, Ser  Date Value Ref Range Status  12/07/2016 1.87 (H) 0.44 - 1.00 mg/dL Final  11/18/2016 0.99 0.44 - 1.00 mg/dL Final  11/17/2016 1.18 (H) 0.44 - 1.00 mg/dL Final  11/16/2016 1.06 (H) 0.44 - 1.00 mg/dL Final      Medications  furosemide (LASIX) injection 40 mg (40 mg Intravenous Given 12/07/16 2344)  sodium polystyrene (KAYEXALATE) 15 GM/60ML suspension 15 g (15 g Oral Given 12/07/16 2345)  ondansetron (ZOFRAN) injection 4 mg (4 mg Intravenous Given 12/07/16 2344)    Patient Vitals for the past 24 hrs:  BP Temp Temp src Pulse Resp SpO2 Height Weight  12/07/16 2356 - - - (!) 45 19 (!) 83 % - -  12/07/16 2345 112/83 - - 80 20 - - -  12/07/16 2340 - - - -  17 - - -  12/07/16 2301 - - - 102 18 100 % - -  12/07/16 2300 105/87 - - - 16 - - -  12/07/16 2231 - - - - - - - 232 lb 5.8 oz (105.4 kg)  12/07/16 2145 100/88 - - 97 21 100 % - -  12/07/16 2130 108/62 - - 91 16 96 % - -  12/07/16 2100 108/85 - - 91 22 99 % - -  12/07/16 2040 102/81 97.5 F (36.4 C) Oral 92 - 100 % 5' 5.5" (1.664 m) 228 lb (103.4 kg)    11:33 PM Reevaluation with update and discussion. After initial assessment and treatment, an updated evaluation reveals she has been able eat some but still feels bad.  No chest pain or shortness of breath while at rest.  Family updated on findings and plan to consult cardiology.Daleen Bo L    Final Clinical Impressions(s) / ED Diagnoses   Final diagnoses:  Dyspnea on exertion  AKI (acute kidney injury) ([place])  Hyperkalemia  Nausea and vomiting, intractability of vomiting not specified, unspecified vomiting type   Dyspnea on exertion, with atrial fibrillation, congestive heart failure EF 20-25% on recent TEE, recently started on amiodarone, and tenuous fluid status with renal insufficiency, associated with general malaise.  Weight up 4 kg since hospital discharge,  10 days ago.  AKI complicated by hyperkalemia.  Malaise and vomiting likely related to amiodarone initiation cardiology consultation, for assistance with management.  Nursing Notes Reviewed/ Care Coordinated Applicable Imaging Reviewed Interpretation of Laboratory Data incorporated into ED treatment  Plan: Admit   New Prescriptions New Prescriptions   No medications on file         Daleen Bo, MD 12/08/16 0001

## 2016-12-08 DIAGNOSIS — I959 Hypotension, unspecified: Secondary | ICD-10-CM | POA: Diagnosis not present

## 2016-12-08 DIAGNOSIS — Z90710 Acquired absence of both cervix and uterus: Secondary | ICD-10-CM | POA: Diagnosis not present

## 2016-12-08 DIAGNOSIS — E039 Hypothyroidism, unspecified: Secondary | ICD-10-CM | POA: Diagnosis present

## 2016-12-08 DIAGNOSIS — E878 Other disorders of electrolyte and fluid balance, not elsewhere classified: Secondary | ICD-10-CM | POA: Diagnosis present

## 2016-12-08 DIAGNOSIS — Z8249 Family history of ischemic heart disease and other diseases of the circulatory system: Secondary | ICD-10-CM | POA: Diagnosis not present

## 2016-12-08 DIAGNOSIS — N189 Chronic kidney disease, unspecified: Secondary | ICD-10-CM | POA: Diagnosis not present

## 2016-12-08 DIAGNOSIS — Z85820 Personal history of malignant melanoma of skin: Secondary | ICD-10-CM | POA: Diagnosis not present

## 2016-12-08 DIAGNOSIS — I481 Persistent atrial fibrillation: Secondary | ICD-10-CM

## 2016-12-08 DIAGNOSIS — Z823 Family history of stroke: Secondary | ICD-10-CM | POA: Diagnosis not present

## 2016-12-08 DIAGNOSIS — R0602 Shortness of breath: Secondary | ICD-10-CM | POA: Diagnosis present

## 2016-12-08 DIAGNOSIS — I10 Essential (primary) hypertension: Secondary | ICD-10-CM | POA: Diagnosis not present

## 2016-12-08 DIAGNOSIS — N3090 Cystitis, unspecified without hematuria: Secondary | ICD-10-CM | POA: Diagnosis not present

## 2016-12-08 DIAGNOSIS — I5043 Acute on chronic combined systolic (congestive) and diastolic (congestive) heart failure: Secondary | ICD-10-CM | POA: Diagnosis present

## 2016-12-08 DIAGNOSIS — E1121 Type 2 diabetes mellitus with diabetic nephropathy: Secondary | ICD-10-CM | POA: Diagnosis present

## 2016-12-08 DIAGNOSIS — I350 Nonrheumatic aortic (valve) stenosis: Secondary | ICD-10-CM | POA: Diagnosis present

## 2016-12-08 DIAGNOSIS — G4733 Obstructive sleep apnea (adult) (pediatric): Secondary | ICD-10-CM | POA: Diagnosis present

## 2016-12-08 DIAGNOSIS — I50810 Right heart failure, unspecified: Secondary | ICD-10-CM | POA: Diagnosis not present

## 2016-12-08 DIAGNOSIS — E875 Hyperkalemia: Secondary | ICD-10-CM | POA: Diagnosis present

## 2016-12-08 DIAGNOSIS — I5023 Acute on chronic systolic (congestive) heart failure: Secondary | ICD-10-CM | POA: Diagnosis present

## 2016-12-08 DIAGNOSIS — E1165 Type 2 diabetes mellitus with hyperglycemia: Secondary | ICD-10-CM | POA: Diagnosis present

## 2016-12-08 DIAGNOSIS — N179 Acute kidney failure, unspecified: Secondary | ICD-10-CM | POA: Diagnosis present

## 2016-12-08 DIAGNOSIS — K219 Gastro-esophageal reflux disease without esophagitis: Secondary | ICD-10-CM | POA: Diagnosis present

## 2016-12-08 DIAGNOSIS — G2581 Restless legs syndrome: Secondary | ICD-10-CM | POA: Diagnosis present

## 2016-12-08 DIAGNOSIS — E7800 Pure hypercholesterolemia, unspecified: Secondary | ICD-10-CM | POA: Diagnosis present

## 2016-12-08 DIAGNOSIS — Z833 Family history of diabetes mellitus: Secondary | ICD-10-CM | POA: Diagnosis not present

## 2016-12-08 DIAGNOSIS — I130 Hypertensive heart and chronic kidney disease with heart failure and stage 1 through stage 4 chronic kidney disease, or unspecified chronic kidney disease: Secondary | ICD-10-CM | POA: Diagnosis present

## 2016-12-08 DIAGNOSIS — R7989 Other specified abnormal findings of blood chemistry: Secondary | ICD-10-CM | POA: Diagnosis not present

## 2016-12-08 DIAGNOSIS — I480 Paroxysmal atrial fibrillation: Secondary | ICD-10-CM | POA: Diagnosis not present

## 2016-12-08 DIAGNOSIS — I429 Cardiomyopathy, unspecified: Secondary | ICD-10-CM | POA: Diagnosis present

## 2016-12-08 DIAGNOSIS — E871 Hypo-osmolality and hyponatremia: Secondary | ICD-10-CM | POA: Diagnosis present

## 2016-12-08 DIAGNOSIS — R531 Weakness: Secondary | ICD-10-CM | POA: Diagnosis not present

## 2016-12-08 DIAGNOSIS — E1122 Type 2 diabetes mellitus with diabetic chronic kidney disease: Secondary | ICD-10-CM | POA: Diagnosis present

## 2016-12-08 DIAGNOSIS — R112 Nausea with vomiting, unspecified: Secondary | ICD-10-CM | POA: Diagnosis present

## 2016-12-08 LAB — URINALYSIS, ROUTINE W REFLEX MICROSCOPIC
Bacteria, UA: NONE SEEN
Bilirubin Urine: NEGATIVE
Glucose, UA: NEGATIVE mg/dL
Hgb urine dipstick: NEGATIVE
Ketones, ur: NEGATIVE mg/dL
Nitrite: NEGATIVE
Protein, ur: 30 mg/dL — AB
Specific Gravity, Urine: 1.015 (ref 1.005–1.030)
pH: 5 (ref 5.0–8.0)

## 2016-12-08 LAB — BASIC METABOLIC PANEL
Anion gap: 9 (ref 5–15)
BUN: 44 mg/dL — ABNORMAL HIGH (ref 6–20)
CO2: 22 mmol/L (ref 22–32)
Calcium: 8.5 mg/dL — ABNORMAL LOW (ref 8.9–10.3)
Chloride: 98 mmol/L — ABNORMAL LOW (ref 101–111)
Creatinine, Ser: 1.88 mg/dL — ABNORMAL HIGH (ref 0.44–1.00)
GFR calc Af Amer: 29 mL/min — ABNORMAL LOW (ref >60)
GFR calc non Af Amer: 25 mL/min — ABNORMAL LOW (ref >60)
Glucose, Bld: 166 mg/dL — ABNORMAL HIGH (ref 65–99)
Potassium: 4.8 mmol/L (ref 3.5–5.1)
Sodium: 129 mmol/L — ABNORMAL LOW (ref 135–145)

## 2016-12-08 LAB — CREATININE, URINE, RANDOM: Creatinine, Urine: 43.81 mg/dL

## 2016-12-08 LAB — TROPONIN I
Troponin I: <0.03 ng/mL (ref <0.03)
Troponin I: <0.03 ng/mL (ref <0.03)

## 2016-12-08 LAB — GLUCOSE, CAPILLARY
Glucose-Capillary: 111 mg/dL — ABNORMAL HIGH (ref 65–99)
Glucose-Capillary: 135 mg/dL — ABNORMAL HIGH (ref 65–99)

## 2016-12-08 LAB — SODIUM, URINE, RANDOM: Sodium, Ur: 21 mmol/L

## 2016-12-08 LAB — BRAIN NATRIURETIC PEPTIDE: B Natriuretic Peptide: 1372.6 pg/mL — ABNORMAL HIGH (ref 0.0–100.0)

## 2016-12-08 LAB — PROTIME-INR
INR: 2.89
Prothrombin Time: 30.9 seconds — ABNORMAL HIGH (ref 11.4–15.2)

## 2016-12-08 LAB — CBG MONITORING, ED: Glucose-Capillary: 75 mg/dL (ref 65–99)

## 2016-12-08 LAB — NA AND K (SODIUM & POTASSIUM), RAND UR
Potassium Urine: 65 mmol/L
Sodium, Ur: <10 mmol/L

## 2016-12-08 LAB — TSH: TSH: 2.153 u[IU]/mL (ref 0.350–4.500)

## 2016-12-08 MED ORDER — LEVOTHYROXINE SODIUM 88 MCG PO TABS
88.0000 ug | ORAL_TABLET | Freq: Every day | ORAL | Status: DC
  Administered 2016-12-08 – 2016-12-18 (×11): 88 ug via ORAL
  Filled 2016-12-08 (×12): qty 1

## 2016-12-08 MED ORDER — ROPINIROLE HCL 0.5 MG PO TABS
0.5000 mg | ORAL_TABLET | Freq: Every day | ORAL | Status: DC
  Administered 2016-12-08: 1 mg via ORAL
  Administered 2016-12-08: 0.5 mg via ORAL
  Administered 2016-12-09: 1 mg via ORAL
  Filled 2016-12-08 (×4): qty 2

## 2016-12-08 MED ORDER — ACETAMINOPHEN 500 MG PO TABS
500.0000 mg | ORAL_TABLET | Freq: Four times a day (QID) | ORAL | Status: DC | PRN
Start: 2016-12-08 — End: 2016-12-18
  Administered 2016-12-12: 1000 mg via ORAL
  Filled 2016-12-08: qty 2

## 2016-12-08 MED ORDER — ALPRAZOLAM 0.5 MG PO TABS
0.5000 mg | ORAL_TABLET | Freq: Two times a day (BID) | ORAL | Status: DC
  Administered 2016-12-08 – 2016-12-18 (×20): 0.5 mg via ORAL
  Filled 2016-12-08 (×21): qty 1

## 2016-12-08 MED ORDER — SODIUM CHLORIDE 0.9% FLUSH
3.0000 mL | Freq: Two times a day (BID) | INTRAVENOUS | Status: DC
  Administered 2016-12-08 – 2016-12-11 (×6): 3 mL via INTRAVENOUS

## 2016-12-08 MED ORDER — TRAMADOL HCL 50 MG PO TABS
50.0000 mg | ORAL_TABLET | Freq: Every day | ORAL | Status: DC
  Administered 2016-12-08 – 2016-12-17 (×10): 50 mg via ORAL
  Filled 2016-12-08 (×10): qty 1

## 2016-12-08 MED ORDER — METOPROLOL TARTRATE 25 MG PO TABS
25.0000 mg | ORAL_TABLET | Freq: Two times a day (BID) | ORAL | Status: DC
  Administered 2016-12-08 – 2016-12-10 (×6): 25 mg via ORAL
  Filled 2016-12-08 (×6): qty 1

## 2016-12-08 MED ORDER — FUROSEMIDE 10 MG/ML IJ SOLN
40.0000 mg | Freq: Four times a day (QID) | INTRAMUSCULAR | Status: AC
  Administered 2016-12-08 (×3): 40 mg via INTRAVENOUS
  Filled 2016-12-08 (×4): qty 4

## 2016-12-08 MED ORDER — AMIODARONE HCL 200 MG PO TABS
400.0000 mg | ORAL_TABLET | Freq: Two times a day (BID) | ORAL | Status: DC
  Administered 2016-12-08 – 2016-12-14 (×13): 400 mg via ORAL
  Filled 2016-12-08 (×15): qty 2

## 2016-12-08 MED ORDER — ASPIRIN EC 81 MG PO TBEC
81.0000 mg | DELAYED_RELEASE_TABLET | Freq: Every day | ORAL | Status: DC
  Administered 2016-12-09: 81 mg via ORAL
  Filled 2016-12-08 (×2): qty 1

## 2016-12-08 MED ORDER — DEXTROSE 5 % IV SOLN
1.0000 g | INTRAVENOUS | Status: DC
  Administered 2016-12-08 – 2016-12-11 (×4): 1 g via INTRAVENOUS
  Filled 2016-12-08 (×5): qty 10

## 2016-12-08 MED ORDER — AMIODARONE HCL 200 MG PO TABS: 200.0000 mg | ORAL_TABLET | ORAL | Status: DC

## 2016-12-08 MED ORDER — PANTOPRAZOLE SODIUM 40 MG IV SOLR
40.0000 mg | INTRAVENOUS | Status: DC
  Administered 2016-12-08 – 2016-12-17 (×10): 40 mg via INTRAVENOUS
  Filled 2016-12-08 (×10): qty 40

## 2016-12-08 MED ORDER — ROPINIROLE HCL 0.5 MG PO TABS: 0.5000 mg | ORAL_TABLET | Freq: Two times a day (BID) | ORAL | Status: DC

## 2016-12-08 MED ORDER — APIXABAN 5 MG PO TABS
5.0000 mg | ORAL_TABLET | Freq: Two times a day (BID) | ORAL | Status: DC
  Administered 2016-12-08 – 2016-12-18 (×21): 5 mg via ORAL
  Filled 2016-12-08 (×23): qty 1

## 2016-12-08 MED ORDER — INSULIN ASPART 100 UNIT/ML ~~LOC~~ SOLN: 0.0000 [IU] | Freq: Every day | SUBCUTANEOUS | Status: DC

## 2016-12-08 MED ORDER — ROPINIROLE HCL 1 MG PO TABS
0.5000 mg | ORAL_TABLET | Freq: Every day | ORAL | Status: DC
  Administered 2016-12-08 – 2016-12-10 (×3): 0.5 mg via ORAL
  Filled 2016-12-08 (×4): qty 1

## 2016-12-08 MED ORDER — ATORVASTATIN CALCIUM 40 MG PO TABS
40.0000 mg | ORAL_TABLET | Freq: Every day | ORAL | Status: DC
  Administered 2016-12-08 – 2016-12-17 (×10): 40 mg via ORAL
  Filled 2016-12-08 (×11): qty 1

## 2016-12-08 MED ORDER — BUPROPION HCL ER (XL) 150 MG PO TB24
150.0000 mg | ORAL_TABLET | Freq: Every day | ORAL | Status: DC
  Administered 2016-12-09 – 2016-12-18 (×9): 150 mg via ORAL
  Filled 2016-12-08 (×10): qty 1

## 2016-12-08 MED ORDER — INSULIN ASPART 100 UNIT/ML ~~LOC~~ SOLN
0.0000 [IU] | Freq: Three times a day (TID) | SUBCUTANEOUS | Status: DC
  Administered 2016-12-08 – 2016-12-17 (×5): 1 [IU] via SUBCUTANEOUS

## 2016-12-08 NOTE — H&P (Addendum)
CARDIOLOGY INPATIENT HISTORY AND PHYSICAL EXAMINATION NOTE  Patient ID: Jenna [name] MRN: [id number], DOB/AGE: 75-Nov-1942   Admit date: 12/07/2016   Primary Physician: Chesley Noon, MD Primary Cardiologist: Dr. Debara Pickett and Dr. Sallyanne Kuster  Reason for admission: SOB and weight gain  HPI: This is a 76 y.o. female with mild AS, GERD, HTN, persistent atrial fibrillation, OA, OSA on CPAP and restless leg syndrome who presented with c/o DOE, generalized weakness and dysuria. She was recently admitted from 2/9-2/14 (2 weeks ago) with similar complaints and weight gain. Her most recent echo on 2/10 showed decreased LVEF to 35-40%, mild to mod MR, mod TR and severe RV dysfunction?[name] She was started on eliquis at that time. TEE/DCCV was performed on 2/13 x2 with successful cardioversion. Later on she presented to clinic on 2/21 and continues to have stigmata of volume overload and fatigue. She also converted back to atrial fibrillation and there was a plan to reattempt cardioversion in the coming week. Patient however presented again with worsening SOB, PND, orthopnea and leg swelling. She has gained 4 Kg weight. Her dry weight is 101 kg. She has been loaded with amiodarone and currently is on 200 mg daily of amiodarone on her clinic visit. She also was put on torsemide which was increase to 15 mg and then down to 10 mg after 3 days.  She has been compliant with the medications however for the last 2 weeks she was progressively getting worse and was not able to walk for 10 feet. She lives alone so she came to the ED to be evaluated.  Initial vitals: BP 112/49, 110 irregular pulse, 100% on room air. CBG 98.  Problem List: Past Medical History:  Diagnosis Date  . Anxiety   . Chronic lower back pain   . Depression   . Fatigue   . GERD (gastroesophageal reflux disease)   . Heart murmur   . High cholesterol   . Hypertension   . Hypothyroidism   . Melanoma of shoulder, left ([place])   . Mild aortic stenosis     . OSA on CPAP    "I wear pillows" (11/15/2016)  . Osteoarthritis   . Palpitations   . Persistent atrial fibrillation with RVR ([place])    [name] 11/15/2016  . RLS (restless legs syndrome)   . Type II diabetes mellitus ([place])     Past Surgical History:  Procedure Laterality Date  . CARDIOVERSION N/A 11/19/2016   Procedure: CARDIOVERSION;  Surgeon: Lelon Perla, MD;  Location: [facility] ENDOSCOPY;  Service: Cardiovascular;  Laterality: N/A;  . CATARACT EXTRACTION W/ INTRAOCULAR LENS  IMPLANT, BILATERAL Bilateral   . DILATION AND CURETTAGE OF UTERUS    . LEXISCAN MYOVIEW  05/13/2012   No ECG changes. EKG negative for ischemia. No significant ischemia demonstrated.  [name] MELANOMA EXCISION Left ~ 09/1979   "back of my shoulder"  . TEE WITHOUT CARDIOVERSION N/A 11/19/2016   Procedure: TRANSESOPHAGEAL ECHOCARDIOGRAM (TEE);  Surgeon: Lelon Perla, MD;  Location: [facility] ENDOSCOPY;  Service: Cardiovascular;  Laterality: N/A;  need CV, but no anesthesia available  . TRANSTHORACIC ECHOCARDIOGRAM  02/18/2012   EF >55%, mild aortic stenosis  . VAGINAL HYSTERECTOMY  1985     Allergies:  Allergies  Allergen Reactions  . Amoxicillin Nausea Only  . Crestor [Rosuvastatin] Other (See Comments)    Joints ached  . Lovastatin Other (See Comments)    Joints hurt all over body      Home Medications Current Facility-Administered Medications  Medication Dose Route  Frequency Provider Last Rate Last Dose  . acetaminophen (TYLENOL) tablet 500-1,000 mg  500-1,000 mg Oral Q6H PRN Flossie Dibble, MD      . ALPRAZolam Duanne Moron) tablet 0.5 mg  0.5 mg Oral BID Flossie Dibble, MD      . amiodarone (PACERONE) tablet 200-400 mg  200-400 mg Oral UD Flossie Dibble, MD      . apixaban (ELIQUIS) tablet 5 mg  5 mg Oral BID Flossie Dibble, MD      . aspirin EC tablet 81 mg  81 mg Oral Daily Flossie Dibble, MD      . atorvastatin (LIPITOR) tablet 40 mg  40 mg Oral q1800 Flossie Dibble, MD      . buPROPion (WELLBUTRIN XL) 24 hr  tablet 150 mg  150 mg Oral Daily Flossie Dibble, MD      . furosemide (LASIX) injection 40 mg  40 mg Intravenous Q6H Flossie Dibble, MD   40 mg at 12/08/16 [id number]  . levothyroxine (SYNTHROID, LEVOTHROID) tablet 88 mcg  88 mcg Oral QAC breakfast Flossie Dibble, MD      . metoprolol tartrate (LOPRESSOR) tablet 25 mg  25 mg Oral BID Flossie Dibble, MD      . pantoprazole (PROTONIX) injection 40 mg  40 mg Intravenous Q24H Flossie Dibble, MD      . rOPINIRole (REQUIP) tablet 0.5 mg  0.5 mg Oral Q1200 Jacolyn Reedy, MD       And  . rOPINIRole (REQUIP) tablet 0.5-1 mg  0.5-1 mg Oral QHS Jacolyn Reedy, MD   1 mg at 12/08/16 0027  . sodium chloride flush (NS) 0.9 % injection 3 mL  3 mL Intravenous Q12H Flossie Dibble, MD      . traMADol Veatrice Bourbon) tablet 50 mg  50 mg Oral QHS Flossie Dibble, MD       Current Outpatient Prescriptions  Medication Sig Dispense Refill  . acarbose (PRECOSE) 25 MG tablet Take 25 mg by mouth 3 (three) times daily.      [name] acetaminophen (TYLENOL) 500 MG tablet Take 500-1,000 mg by mouth every 6 (six) hours as needed for mild pain, moderate pain or headache.    . ALPRAZolam (XANAX) 0.5 MG tablet Take 1 tablet by mouth 2 (two) times daily.    [name] amiodarone (PACERONE) 200 MG tablet Take 1-2 tablets (200-400 mg total) by mouth as directed. (Patient taking differently: Take 400 mg by mouth 2 (two) times daily. ) 100 tablet 3  . apixaban (ELIQUIS) 5 MG TABS tablet Take 1 tablet (5 mg total) by mouth 2 (two) times daily. 60 tablet 5  . atorvastatin (LIPITOR) 40 MG tablet TAKE 1 TABLET (40 MG TOTAL) BY MOUTH DAILY. 30 tablet 11  . buPROPion (WELLBUTRIN XL) 150 MG 24 hr tablet TAKE ONE TABLET (150 MG TOTAL) BY MOUTH TWICE DAILY    . Calcium Carbonate-Vitamin D (CALCIUM + D PO) Take 600 mg by mouth 2 (two) times daily.     [name] CHERRY PO Take by mouth. Drink 1/2 cup before breakfast and 1/2 cup before supper    . Ginger, Zingiber officinalis, (GINGER PO) Take 1 capsule by mouth 2  (two) times daily.     [name] levothyroxine (SYNTHROID, LEVOTHROID) 88 MCG tablet Take 88 mcg by mouth daily before breakfast.    . Magnesium 250 MG TABS Take 2 tablets by mouth daily.     . meclizine (ANTIVERT) 25 MG tablet Take  1 tablet (25 mg total) by mouth 3 (three) times daily as needed for dizziness. 30 tablet 0  . metoprolol tartrate (LOPRESSOR) 25 MG tablet Take 1 tablet (25 mg total) by mouth 2 (two) times daily. 60 tablet 3  . Misc. Devices MISC by Does not apply route at bedtime. CPAP    . Multiple Vitamins-Minerals (PRESERVISION AREDS 2) CAPS Take 1 capsule by mouth 2 (two) times daily.    . Omega-3 Fatty Acids (FISH OIL) 1200 MG CAPS Take 1 capsule by mouth 2 (two) times daily. Take 1 at breakfast and 1 at supper    . omeprazole (PRILOSEC) 40 MG capsule TAKE ONE CAPSULE BY MOUTH EVERY DAY    . potassium chloride SA (KLOR-CON M20) 20 MEQ tablet Take 1 tablet by mouth 2 (two) times daily.    [name] rOPINIRole (REQUIP) 0.5 MG tablet Take 2-3 daily at bedtime as needed (Patient taking differently: Take 0.5-1 mg by mouth 2 (two) times daily. 0.5 mg by mouth midday then 0.5-1 mg at bedtime) 90 tablet 3  . torsemide (DEMADEX) 5 MG tablet Take 1 tablet (5 mg total) by mouth daily. 30 tablet 2  . traMADol (ULTRAM) 50 MG tablet Take 50 mg by mouth at bedtime.        Family History  Problem Relation Age of Onset  . Hypertension Mother   . Diabetes Mother   . Heart failure Father   . Arrhythmia Sister   . Hyperlipidemia Sister   . Hypertension Sister   . Healthy Brother   . Stroke Paternal Grandmother   . Cancer Paternal Grandfather   . Healthy Daughter   . Hypertension Son   . Hyperlipidemia Son      Social History   Social History  . Marital status: Widowed    Spouse name: N/A  . Number of children: N/A  . Years of education: N/A   Occupational History  . Not on file.   Social History Main Topics  . Smoking status: Never Smoker  . Smokeless tobacco: Never Used  . Alcohol use  No  . Drug use: No  . Sexual activity: No   Other Topics Concern  . Not on file   Social History Narrative  . No narrative on file     Review of Systems: General: negative for chills, fever, night sweats or weight changes.  Cardiovascular: dyspnea on exertion, leg swelling, SOB, denied syncope and chest pain occasional palpitations Dermatological: negative for rash Respiratory: negative for cough or wheezing Urologic: dysuria Abdominal: nausea/vomiting Neurologic: negative for visual changes, syncope, or dizziness Endocrine: diabetes, hypothyroidism Immunological: no lymph adenopathy Psych: non homicidal/suicidal  Physical Exam: Vitals: BP 91/75   Pulse 107   Temp 97.9 F (36.6 C) (Oral)   Resp 22   Ht 5' 5.5" (1.664 m)   Wt 105.4 kg (232 lb 5.8 oz)   SpO2 99%   BMI 38.08 kg/m  General: not in acute distress Neck: JVP mid neck at 45 degrees, able to lay flat Heart: irregular rate and rhythm, S1, S2, difficult to hear heart sounds, systolic grade I/VI on RUSB murmur harsh in quality Lungs: CTAB  GI: non tender, non distended, bowel sounds present Extremities: no edema Neuro: AAO x 3  Psych: normal affect, no anxiety   Labs:   Results for orders placed or performed during the hospital encounter of 12/07/16 (from the past 24 hour(s))  Basic metabolic panel     Status: Abnormal   Collection Time: 12/07/16  9:35  PM  Result Value Ref Range   Sodium 126 (L) 135 - 145 mmol/L   Potassium 6.1 (H) 3.5 - 5.1 mmol/L   Chloride 93 (L) 101 - 111 mmol/L   CO2 21 (L) 22 - 32 mmol/L   Glucose, Bld 81 65 - 99 mg/dL   BUN 43 (H) 6 - 20 mg/dL   Creatinine, Ser 1.87 (H) 0.44 - 1.00 mg/dL   Calcium 8.9 8.9 - 10.3 mg/dL   GFR calc non Af Amer 25 (L) >60 mL/min   GFR calc Af Amer 29 (L) >60 mL/min   Anion gap 12 5 - 15  Troponin I     Status: None   Collection Time: 12/07/16  9:35 PM  Result Value Ref Range   Troponin I <0.03 <0.03 ng/mL  CBC with Differential     Status:  Abnormal   Collection Time: 12/07/16  9:35 PM  Result Value Ref Range   WBC 10.9 (H) 4.0 - 10.5 K/uL   RBC 4.59 3.87 - 5.11 MIL/uL   Hemoglobin 12.0 12.0 - 15.0 g/dL   HCT 37.1 36.0 - 46.0 %   MCV 80.8 78.0 - 100.0 fL   MCH 26.1 26.0 - 34.0 pg   MCHC 32.3 30.0 - 36.0 g/dL   RDW 14.7 11.5 - 15.5 %   Platelets 458 (H) 150 - 400 K/uL   Neutrophils Relative % 65 %   Neutro Abs 7.0 1.7 - 7.7 K/uL   Lymphocytes Relative 21 %   Lymphs Abs 2.3 0.7 - 4.0 K/uL   Monocytes Relative 13 %   Monocytes Absolute 1.5 (H) 0.1 - 1.0 K/uL   Eosinophils Relative 1 %   Eosinophils Absolute 0.1 0.0 - 0.7 K/uL   Basophils Relative 0 %   Basophils Absolute 0.0 0.0 - 0.1 K/uL  Brain natriuretic peptide     Status: Abnormal   Collection Time: 12/07/16 10:00 PM  Result Value Ref Range   B Natriuretic Peptide 1,423.9 (H) 0.0 - 100.0 pg/mL  Urinalysis, Routine w reflex microscopic     Status: Abnormal   Collection Time: 12/08/16 12:53 AM  Result Value Ref Range   Color, Urine YELLOW YELLOW   APPearance HAZY (A) CLEAR   Specific Gravity, Urine 1.015 1.005 - 1.030   pH 5.0 5.0 - 8.0   Glucose, UA NEGATIVE NEGATIVE mg/dL   Hgb urine dipstick NEGATIVE NEGATIVE   Bilirubin Urine NEGATIVE NEGATIVE   Ketones, ur NEGATIVE NEGATIVE mg/dL   Protein, ur 30 (A) NEGATIVE mg/dL   Nitrite NEGATIVE NEGATIVE   Leukocytes, UA MODERATE (A) NEGATIVE   RBC / HPF 0-5 0 - 5 RBC/hpf   WBC, UA 6-30 0 - 5 WBC/hpf   Bacteria, UA NONE SEEN NONE SEEN   Squamous Epithelial / LPF 0-5 (A) NONE SEEN   Hyaline Casts, UA PRESENT   Na and K (sodium & potassium), rand urine     Status: None   Collection Time: 12/08/16 12:53 AM  Result Value Ref Range   Sodium, Ur <10 mmol/L   Potassium Urine 65 mmol/L  Brain natriuretic peptide     Status: Abnormal   Collection Time: 12/08/16  2:34 AM  Result Value Ref Range   B Natriuretic Peptide 1,372.6 (H) 0.0 - 100.0 pg/mL  TSH     Status: None   Collection Time: 12/08/16  2:34 AM  Result  Value Ref Range   TSH 2.153 0.350 - 4.500 uIU/mL  Troponin I     Status: None  Collection Time: 12/08/16  2:34 AM  Result Value Ref Range   Troponin I <0.03 <0.03 ng/mL  Protime-INR     Status: Abnormal   Collection Time: 12/08/16  2:55 AM  Result Value Ref Range   Prothrombin Time 30.9 (H) 11.4 - 15.2 seconds   INR 2.89      Radiology/Studies: Dg Chest Port 1 View  Result Date: 12/07/2016 CLINICAL DATA:  Shortness of breath and weakness EXAM: PORTABLE CHEST 1 VIEW COMPARISON:  October 07, 2004 FINDINGS: The study is limited due to the low volume portable technique. Probable atelectasis in the left base. Stable cardiomegaly. No other interval changes or acute abnormalities. IMPRESSION: Limited study to technique. Mild left basilar atelectasis. No other acute abnormalities. Electronically Signed   By: Dorise Bullion III M.D   On: 12/07/2016 22:21    EKG: atrial fibrillation with rate controlled, Q waves in the  Anterior leads, minimal remote septal infarct, incomplete RBBB  Echo: 11/16/2016 - Left ventricle: The cavity size was mildly dilated. Wall   thickness was normal. Systolic function was moderately reduced.   The estimated ejection fraction was in the range of 35% to 40%. - Aortic valve: There was mild stenosis. Valve area (VTI): 0.71   cm^2. Valve area (Vmax): 0.7 cm^2. Valve area (Vmean): 0.67 cm^2. - Mitral valve: There was mild to moderate regurgitation. - Tricuspid valve: There was moderate regurgitation.   Cardiac cath: not performed  Medical decision making:  Discussed care with the patient Discussed care with the physician on the phone Reviewed labs and imaging personally Reviewed prior records  ASSESSMENT AND PLAN:  This is a 76 y.o. female with multiple comorbodities presented with CHF symptoms and worsening renal function requiring admission   Active Problems:   Aortic stenosis, mild   HTN (hypertension)   Hypothyroidism   Longstanding persistent atrial  fibrillation (H[facility])   Acute kidney injury ([place])   Acute on chronic combined systolic and diastolic CHF, NYHA class 4 (H[facility])   Hyperkalemia, diminished renal excretion   Nausea & vomiting   Arterial hypotension   Acute on chronic diastolic and systolic heart failure NYHA class IV, likely secondary to worsening renal function, has elevated BNP, heart rate controlled - strict I/Os, IV diuresis with lasix, daily weights, low sodium diet, keep potassium >4, calcium >9, magnesium >2, echocardiogram reviewed  Persistent non valvular atrial fibrillation with RVR, currently rate controlled is moderate, hypervolemic,on amiodarone, planned to have DCCV, on eliquis,  PLAN: - if needed rate control, can use IV amiodarone, checking TSH, electrolytes, keep potassium >4, calcium >9, magnesium >2, telemetry monitoring, continue PO amiodarone  Severe Hyperkalemia K = 6.1 - due to decreased renal excretion Plan: BID BMP,  Renal diet, kayexelate given in the ED, recheck K  Acute on chronic kidney injury, CKD stage 3a, likely cardiorenal vs. Dehydration, baseline creatinine 1.2, no acute need for dialysis, unlikely to be ATN PLAN: will check urine sodium, IV diuresis for now if continues to have low urinary sodium likely secondary to overdiuresis but currently looks hypervolemic, monitor sodium  Nausea/vomiting with hyponatremia and hypochloremia - secondary to vomiting Plan: will monitor Na, zofran prn not to exceed >20 mg/day  Asymptomatic Hypotension, secondary to low LVEF + medication induced Plan: will hold BP meds for now to allow diuresis, okay to have MAP >65. Will manage by MAP  OSA on CPAP - continue CPAP Hypertension - currently hypotensive so holding BP meds Diabetes mellitus with nephropathy  SSI, finger sticks, No oral hypoglycemics,  HbA1c Hypothyroidism - checking TSH, continue levothyroxine Fatigue - related to electrolyte abnormality, cystitis and CHF Cystitis, dysuria - probably due to  stasis - will treat with ceftriaxone x 3 days, ordered urine culture and blood cultures that will need to be followed and the antibiotic therapy might need to be changed based on the new data  Signed, Flossie Dibble, MD MS 12/08/2016, 6:27 AM

## 2016-12-08 NOTE — ED Notes (Signed)
Sent note to pharmacy to have meds re-timed.

## 2016-12-08 NOTE — ED Notes (Signed)
Spoke with Admitting regarding pt's BP's dropping; pt is asymptomatic at this time; cuff readjusted and new BP taken; Md ok with pressures at this time.

## 2016-12-08 NOTE — ED Notes (Signed)
Attempted to call report x 1  

## 2016-12-08 NOTE — ED Notes (Signed)
Admitting Md paged for low BP

## 2016-12-09 LAB — BASIC METABOLIC PANEL
Anion gap: 5 (ref 5–15)
Anion gap: 13 (ref 5–15)
BUN: 44 mg/dL — ABNORMAL HIGH (ref 6–20)
BUN: 46 mg/dL — ABNORMAL HIGH (ref 6–20)
CO2: 28 mmol/L (ref 22–32)
CO2: 25 mmol/L (ref 22–32)
Calcium: 8.6 mg/dL — ABNORMAL LOW (ref 8.9–10.3)
Calcium: 8.8 mg/dL — ABNORMAL LOW (ref 8.9–10.3)
Chloride: 99 mmol/L — ABNORMAL LOW (ref 101–111)
Chloride: 93 mmol/L — ABNORMAL LOW (ref 101–111)
Creatinine, Ser: 1.8 mg/dL — ABNORMAL HIGH (ref 0.44–1.00)
Creatinine, Ser: 1.69 mg/dL — ABNORMAL HIGH (ref 0.44–1.00)
GFR calc Af Amer: 31 mL/min — ABNORMAL LOW (ref >60)
GFR calc Af Amer: 33 mL/min — ABNORMAL LOW (ref >60)
GFR calc non Af Amer: 27 mL/min — ABNORMAL LOW (ref >60)
GFR calc non Af Amer: 29 mL/min — ABNORMAL LOW (ref >60)
Glucose, Bld: 87 mg/dL (ref 65–99)
Glucose, Bld: 136 mg/dL — ABNORMAL HIGH (ref 65–99)
Potassium: 4.9 mmol/L (ref 3.5–5.1)
Potassium: 4.3 mmol/L (ref 3.5–5.1)
Sodium: 132 mmol/L — ABNORMAL LOW (ref 135–145)
Sodium: 131 mmol/L — ABNORMAL LOW (ref 135–145)

## 2016-12-09 LAB — URINE CULTURE: Culture: NO GROWTH

## 2016-12-09 LAB — HEMOGLOBIN A1C
Hgb A1c MFr Bld: 6.7 % — ABNORMAL HIGH (ref 4.8–5.6)
Mean Plasma Glucose: 146 mg/dL

## 2016-12-09 LAB — GLUCOSE, CAPILLARY
Glucose-Capillary: 88 mg/dL (ref 65–99)
Glucose-Capillary: 71 mg/dL (ref 65–99)
Glucose-Capillary: 134 mg/dL — ABNORMAL HIGH (ref 65–99)
Glucose-Capillary: 137 mg/dL — ABNORMAL HIGH (ref 65–99)

## 2016-12-09 MED ORDER — FUROSEMIDE 10 MG/ML IJ SOLN
40.0000 mg | Freq: Two times a day (BID) | INTRAMUSCULAR | Status: DC
  Administered 2016-12-09 – 2016-12-10 (×4): 40 mg via INTRAVENOUS
  Filled 2016-12-09 (×4): qty 4

## 2016-12-09 NOTE — Progress Notes (Signed)
Subjective:  The patient is sitting up in a chair at the bedside. She continues to complain of shortness of breath with minimal activity. No orthopnea, PND, or chest pain.  Objective:  Vital Signs in the last 24 hours: Temp:  [98 F (36.7 C)] 98 F (36.7 C) (03/05 0431) Pulse Rate:  [99-108] 106 (03/05 0843) Resp:  [12-16] 16 (03/05 0431) BP: (95-111)/(65-79) 108/65 (03/05 0843) SpO2:  [98 %-100 %] 100 % (03/05 0431) Weight:  [232 lb 5.8 oz (105.4 kg)] 232 lb 5.8 oz (105.4 kg) (03/04 1125)  Intake/Output from previous day: No intake/output data recorded.  Physical Exam: Pt is alert and oriented, pleasant obese woman in NAD HEENT: normal Neck: JVP - moderately elevated Lungs: CTA bilaterally CV: Irregularly irregular with grade 2/6 systolic ejection murmur at the left lower sternal border Abd: soft, NT, Positive BS, no hepatomegaly Ext: 2+ pretibial edema bilaterally to the knees Skin: warm/dry no rash   Lab Results:  Recent Labs  12/07/16 2135  WBC 10.9*  HGB 12.0  PLT 458*    Recent Labs  12/08/16 1750 12/09/16 0724  NA 129* 132*  K 4.8 4.9  CL 98* 99*  CO2 22 28  GLUCOSE 166* 87  BUN 44* 44*  CREATININE 1.88* 1.80*    Recent Labs  12/08/16 0234 12/08/16 1430  TROPONINI <0.03 <0.03    Cardiac Studies: 2D Echo 11/16/2016: Left ventricle:  The cavity size was mildly dilated. Wall thickness was normal. Systolic function was moderately reduced. The estimated ejection fraction was in the range of 35% to 40%.  ------------------------------------------------------------------- Aortic valve:   Moderately thickened, mildly calcified leaflets. Doppler:   There was mild stenosis.   There was no significant regurgitation.    VTI ratio of LVOT to aortic valve: 0.28. Valve area (VTI): 0.71 cm^2. Indexed valve area (VTI): 0.33 cm^2/m^2. Peak velocity ratio of LVOT to aortic valve: 0.27. Valve area (Vmax): 0.7 cm^2. Indexed valve area (Vmax): 0.32  cm^2/m^2. Mean velocity ratio of LVOT to aortic valve: 0.26. Valve area (Vmean): 0.67 cm^2. Indexed valve area (Vmean): 0.31 cm^2/m^2.    Mean gradient (S): 11 mm Hg. Peak gradient (S): 18 mm Hg.  ------------------------------------------------------------------- Aorta:  Aortic root: The aortic root was normal in size. Ascending aorta: The ascending aorta was normal in size.  ------------------------------------------------------------------- Mitral valve:   Mildly thickened leaflets .  Doppler:  There was mild to moderate regurgitation.  ------------------------------------------------------------------- Left atrium:  The atrium was normal in size.  ------------------------------------------------------------------- Right ventricle:  The cavity size was normal. Systolic function was normal.  ------------------------------------------------------------------- Pulmonic valve:    The valve appears to be grossly normal. Doppler:  There was no significant regurgitation.  ------------------------------------------------------------------- Tricuspid valve:   The valve appears to be grossly normal. Doppler:  There was moderate regurgitation.  ------------------------------------------------------------------- Right atrium:  The atrium was normal in size.  ------------------------------------------------------------------- Pericardium:  There was no pericardial effusion.  TEE 11-19-2016: Study Conclusions  - Left ventricle: The cavity size was dilated. Systolic function   was severely reduced. The estimated ejection fraction was in the   range of 20% to 25%. Diffuse hypokinesis. - Aortic valve: Valve mobility was restricted. No evidence of   vegetation. There was mild regurgitation. - Mitral valve: No evidence of vegetation. There was mild to   moderate regurgitation. - Left atrium: The atrium was moderately dilated. No evidence of   thrombus in the atrial cavity or  appendage. - Right ventricle: The cavity size was mildly dilated.  Systolic   function was severely reduced. - Right atrium: The atrium was moderately dilated. - Atrial septum: No defect or patent foramen ovale was identified. - Tricuspid valve: No evidence of vegetation. There was moderate   regurgitation. - Pulmonic valve: No evidence of vegetation.  Impressions:  - Severe global reduction in LV function; 4 chamber enlargement; No   LAA thrombus; severely reduced RV function; calcified aortic   valve with reduced cusp excursion; mild AI; mild to moderate MR;   moderate TR.  Tele: Personally reviewed: Atrial fibrillation heart rate 100 110 bpm  Assessment/Plan:  1. Acute on chronic systolic heart failure: The patient is volume overloaded on exam. I have reviewed her echocardiogram which was performed last month. This showed severe biventricular dysfunction. Suspect symptoms are exacerbated by atrial fibrillation. Blood pressure is low and in the setting of acute kidney injury I think it is best to avoid an ACE/ARB at this time. Continue low-dose beta blocker as tolerated. Will attempt IV diuresis as she is volume overloaded.  2. Persistent atrial fibrillation with RVR: The patient has undergone TEE/cardioversion, but reverted back into atrial fibrillation within a few days. She has been consistently anticoagulated with a pixel band and now has been fully loaded with amiodarone. Recommend repeat cardioversion in the context of her presentation with recurrent congestive heart failure. Reviewed risks, indications, and alternatives with the patient. Will try to get her on the schedule tomorrow.  3. Acute on chronic kidney injury: Will follow creatinine closely while we diurese her.  Disposition: The patient will require ongoing hospitalization for diuresis, cardioversion, and management of her heart failure. Depending on her response to diuretics, may need to involve the advanced heart  failure team.  Sherren Mocha, M.D. 12/09/2016, 9:15 AM

## 2016-12-10 DIAGNOSIS — I480 Paroxysmal atrial fibrillation: Secondary | ICD-10-CM

## 2016-12-10 LAB — BASIC METABOLIC PANEL
Anion gap: 11 (ref 5–15)
Anion gap: 9 (ref 5–15)
Anion gap: 10 (ref 5–15)
BUN: 43 mg/dL — ABNORMAL HIGH (ref 6–20)
BUN: 41 mg/dL — ABNORMAL HIGH (ref 6–20)
BUN: 42 mg/dL — ABNORMAL HIGH (ref 6–20)
CO2: 25 mmol/L (ref 22–32)
CO2: 26 mmol/L (ref 22–32)
CO2: 30 mmol/L (ref 22–32)
Calcium: 8.6 mg/dL — ABNORMAL LOW (ref 8.9–10.3)
Calcium: 8.7 mg/dL — ABNORMAL LOW (ref 8.9–10.3)
Calcium: 8.2 mg/dL — ABNORMAL LOW (ref 8.9–10.3)
Chloride: 97 mmol/L — ABNORMAL LOW (ref 101–111)
Chloride: 98 mmol/L — ABNORMAL LOW (ref 101–111)
Chloride: 94 mmol/L — ABNORMAL LOW (ref 101–111)
Creatinine, Ser: 1.49 mg/dL — ABNORMAL HIGH (ref 0.44–1.00)
Creatinine, Ser: 1.45 mg/dL — ABNORMAL HIGH (ref 0.44–1.00)
Creatinine, Ser: 1.48 mg/dL — ABNORMAL HIGH (ref 0.44–1.00)
GFR calc Af Amer: 39 mL/min — ABNORMAL LOW (ref >60)
GFR calc Af Amer: 40 mL/min — ABNORMAL LOW (ref >60)
GFR calc Af Amer: 39 mL/min — ABNORMAL LOW (ref >60)
GFR calc non Af Amer: 33 mL/min — ABNORMAL LOW (ref >60)
GFR calc non Af Amer: 35 mL/min — ABNORMAL LOW (ref >60)
GFR calc non Af Amer: 34 mL/min — ABNORMAL LOW (ref >60)
Glucose, Bld: 108 mg/dL — ABNORMAL HIGH (ref 65–99)
Glucose, Bld: 89 mg/dL (ref 65–99)
Glucose, Bld: 116 mg/dL — ABNORMAL HIGH (ref 65–99)
Potassium: 3.9 mmol/L (ref 3.5–5.1)
Potassium: 3.9 mmol/L (ref 3.5–5.1)
Potassium: 2.8 mmol/L — ABNORMAL LOW (ref 3.5–5.1)
Sodium: 133 mmol/L — ABNORMAL LOW (ref 135–145)
Sodium: 133 mmol/L — ABNORMAL LOW (ref 135–145)
Sodium: 134 mmol/L — ABNORMAL LOW (ref 135–145)

## 2016-12-10 LAB — GLUCOSE, CAPILLARY
Glucose-Capillary: 82 mg/dL (ref 65–99)
Glucose-Capillary: 119 mg/dL — ABNORMAL HIGH (ref 65–99)
Glucose-Capillary: 145 mg/dL — ABNORMAL HIGH (ref 65–99)
Glucose-Capillary: 110 mg/dL — ABNORMAL HIGH (ref 65–99)

## 2016-12-10 MED ORDER — SODIUM CHLORIDE 0.9% FLUSH: 3.0000 mL | INTRAVENOUS | Status: DC | PRN

## 2016-12-10 MED ORDER — SODIUM CHLORIDE 0.9 % IV SOLN
250.0000 mL | INTRAVENOUS | Status: DC
  Administered 2016-12-18: 11:00:00 via INTRAVENOUS

## 2016-12-10 MED ORDER — SODIUM CHLORIDE 0.9% FLUSH
3.0000 mL | Freq: Two times a day (BID) | INTRAVENOUS | Status: DC
  Administered 2016-12-10 – 2016-12-17 (×14): 3 mL via INTRAVENOUS

## 2016-12-10 MED ORDER — ROPINIROLE HCL 1 MG PO TABS
0.5000 mg | ORAL_TABLET | Freq: Every day | ORAL | Status: DC
  Administered 2016-12-12 – 2016-12-17 (×6): 0.5 mg via ORAL
  Filled 2016-12-10 (×6): qty 1

## 2016-12-10 MED ORDER — ROPINIROLE HCL 1 MG PO TABS
1.0000 mg | ORAL_TABLET | Freq: Every day | ORAL | Status: DC
  Administered 2016-12-10 – 2016-12-17 (×8): 1 mg via ORAL
  Filled 2016-12-10 (×8): qty 1

## 2016-12-10 NOTE — Discharge Instructions (Signed)

## 2016-12-10 NOTE — Progress Notes (Signed)
 Progress Note  Patient Name: Jenna [name] Date of Encounter: 12/10/2016  Primary Cardiologist: Hilty/Croituro  Subjective   No chest pain. She has dyspnea with minimal exertion.   Inpatient Medications    Scheduled Meds: . ALPRAZolam  0.5 mg Oral BID  . amiodarone  400 mg Oral BID  . apixaban  5 mg Oral BID  . atorvastatin  40 mg Oral q1800  . buPROPion  150 mg Oral Daily  . cefTRIAXone (ROCEPHIN)  IV  1 g Intravenous Q24H  . furosemide  40 mg Intravenous BID  . insulin aspart  0-5 Units Subcutaneous QHS  . insulin aspart  0-9 Units Subcutaneous TID WC  . levothyroxine  88 mcg Oral QAC breakfast  . metoprolol tartrate  25 mg Oral BID  . pantoprazole (PROTONIX) IV  40 mg Intravenous Q24H  . rOPINIRole  0.5 mg Oral Q1200   And  . rOPINIRole  0.5-1 mg Oral QHS  . sodium chloride flush  3 mL Intravenous Q12H  . traMADol  50 mg Oral QHS   Continuous Infusions:  PRN Meds: acetaminophen   Vital Signs    Vitals:   12/09/16 2011 12/09/16 2202 12/10/16 0413 12/10/16 0500  BP: 91/64 111/83 103/74   Pulse: (!) 103  94   Resp: 18  16   Temp: 98.3 F (36.8 C)  98.4 F (36.9 C)   TempSrc: Oral  Oral   SpO2: 96%  98%   Weight:    230 lb (104.3 kg)  Height:        Intake/Output Summary (Last 24 hours) at 12/10/16 0853 Last data filed at 12/09/16 2204  Gross per 24 hour  Intake              103 ml  Output              700 ml  Net             -597 ml   Filed Weights   12/07/16 2231 12/08/16 1125 12/10/16 0500  Weight: 232 lb 5.8 oz (105.4 kg) 232 lb 5.8 oz (105.4 kg) 230 lb (104.3 kg)    Telemetry    Atrial fib, rate 90s - Personally Reviewed  ECG    N/A  Physical Exam   GEN: No acute distress.   Neck: + JVD Cardiac: irreg irreg, no murmurs, rubs, or gallops.  Respiratory: Clear to auscultation bilaterally. GI: Soft, nontender, non-distended  Ext: 2+ bilateral LE edema Neuro:  Nonfocal  Psych: Normal affect   Labs    Chemistry Recent Labs Lab  12/09/16 0724 12/09/16 1753 12/10/16 0447  NA 132* 131* 133*  K 4.9 4.3 3.9  CL 99* 93* 97*  CO2 28 25 25  GLUCOSE 87 136* 108*  BUN 44* 46* 43*  CREATININE 1.80* 1.69* 1.49*  CALCIUM 8.6* 8.8* 8.6*  GFRNONAA 27* 29* 33*  GFRAA 31* 33* 39*  ANIONGAP 5 13 11     Hematology Recent Labs Lab 12/07/16 2135  WBC 10.9*  RBC 4.59  HGB 12.0  HCT 37.1  MCV 80.8  MCH 26.1  MCHC 32.3  RDW 14.7  PLT 458*    Cardiac Enzymes Recent Labs Lab 12/07/16 2135 12/08/16 0234 12/08/16 1430  TROPONINI <0.03 <0.03 <0.03   No results for input(s): TROPIPOC in the last 168 hours.   BNP Recent Labs Lab 12/07/16 2200 12/08/16 0234  BNP 1,423.9* 1,372.6*     DDimer No results for input(s): DDIMER in the last 168   hours.   Radiology    No results found.  Cardiac Studies   2D Echo 11/16/2016: Left ventricle: The cavity size was mildly dilated. Wall thickness was normal. Systolic function was moderately reduced. The estimated ejection fraction was in the range of 35% to 40%.  ------------------------------------------------------------------- Aortic valve: Moderately thickened, mildly calcified leaflets. Doppler: There was mild stenosis. There was no significant regurgitation. VTI ratio of LVOT to aortic valve: 0.28. Valve area (VTI): 0.71 cm^2. Indexed valve area (VTI): 0.33 cm^2/m^2. Peak velocity ratio of LVOT to aortic valve: 0.27. Valve area (Vmax): 0.7 cm^2. Indexed valve area (Vmax): 0.32 cm^2/m^2. Mean velocity ratio of LVOT to aortic valve: 0.26. Valve area (Vmean): 0.67 cm^2. Indexed valve area (Vmean): 0.31 cm^2/m^2. Mean gradient (S): 11 mm Hg. Peak gradient (S): 18 mm Hg.  ------------------------------------------------------------------- Aorta: Aortic root: The aortic root was normal in size. Ascending aorta: The ascending aorta was normal in size.  ------------------------------------------------------------------- Mitral valve: Mildly  thickened leaflets . Doppler: There was mild to moderate regurgitation.  ------------------------------------------------------------------- Left atrium: The atrium was normal in size.  ------------------------------------------------------------------- Right ventricle: The cavity size was normal. Systolic function was normal.  ------------------------------------------------------------------- Pulmonic valve: The valve appears to be grossly normal. Doppler: There was no significant regurgitation.  ------------------------------------------------------------------- Tricuspid valve: The valve appears to be grossly normal. Doppler: There was moderate regurgitation.  ------------------------------------------------------------------- Right atrium: The atrium was normal in size.  ------------------------------------------------------------------- Pericardium: There was no pericardial effusion.  TEE 11-19-2016: Study Conclusions  - Left ventricle: The cavity size was dilated. Systolic function was severely reduced. The estimated ejection fraction was in the range of 20% to 25%. Diffuse hypokinesis. - Aortic valve: Valve mobility was restricted. No evidence of vegetation. There was mild regurgitation. - Mitral valve: No evidence of vegetation. There was mild to moderate regurgitation. - Left atrium: The atrium was moderately dilated. No evidence of thrombus in the atrial cavity or appendage. - Right ventricle: The cavity size was mildly dilated. Systolic function was severely reduced. - Right atrium: The atrium was moderately dilated. - Atrial septum: No defect or patent foramen ovale was identified. - Tricuspid valve: No evidence of vegetation. There was moderate regurgitation. - Pulmonic valve: No evidence of vegetation.  Impressions:  - Severe global reduction in LV function; 4 chamber enlargement; No LAA thrombus; severely reduced RV  function; calcified aortic valve with reduced cusp excursion; mild AI; mild to moderate MR; moderate TR.  Patient Profile     75 y.o. female with cardiomyopathy, chronic systolic CHF, mild AS, GERD, HTN, persistent atrial fibrillation, OA, OSA on CPAP admitted with CHF, recurrent atrial fib.   Assessment & Plan    1. Acute on chronic systolic heart failure/Non-ischemic cardiomyopathy: She is volume overloaded on exam. She is diuresing with IV Lasix. She is known to have severe LV systolic dysfunction, presumed non-ischemic cardiomyopathy with CHF likely exacerbated by atrial fib.  -Will continue IV lasix today.  -Continue low dose beta blocker.   2. Persistent atrial fibrillation with RVR: The patient has undergone TEE/cardioversion recently, but reverted back into atrial fibrillation within a few days. She is now fully loaded with amiodarone. She has been on Eliquis. Will attempt repeat DCCV tomorrow based on scheduling.  -NPO at midnight.  -Will continue amiodarone and beta blocker.   3. Acute on chronic kidney injury: Will follow creatinine closely while we diurese her.  Signed, Vincenza Dail, MD  12/10/2016, 8:53 AM    

## 2016-12-11 ENCOUNTER — Inpatient Hospital Stay (HOSPITAL_COMMUNITY): Payer: Medicare Other | Admitting: Certified Registered Nurse Anesthetist

## 2016-12-11 ENCOUNTER — Encounter (HOSPITAL_COMMUNITY): Payer: Self-pay | Admitting: *Deleted

## 2016-12-11 ENCOUNTER — Encounter (HOSPITAL_COMMUNITY)
Admission: EM | Disposition: A | Discharge status: Skilled Nursing Facility | Payer: Self-pay | Source: Home / Self Care | Attending: Internal Medicine

## 2016-12-11 ENCOUNTER — Ambulatory Visit (HOSPITAL_COMMUNITY): Admission: RE | Admit: 2016-12-11 | Payer: Medicare Other | Source: Ambulatory Visit | Admitting: Internal Medicine

## 2016-12-11 HISTORY — PX: CARDIOVERSION: SHX1299

## 2016-12-11 LAB — GLUCOSE, CAPILLARY
Glucose-Capillary: 88 mg/dL (ref 65–99)
Glucose-Capillary: 70 mg/dL (ref 65–99)
Glucose-Capillary: 129 mg/dL — ABNORMAL HIGH (ref 65–99)
Glucose-Capillary: 105 mg/dL — ABNORMAL HIGH (ref 65–99)

## 2016-12-11 LAB — BASIC METABOLIC PANEL
Anion gap: 10 (ref 5–15)
Anion gap: 10 (ref 5–15)
BUN: 38 mg/dL — ABNORMAL HIGH (ref 6–20)
BUN: 38 mg/dL — ABNORMAL HIGH (ref 6–20)
CO2: 28 mmol/L (ref 22–32)
CO2: 30 mmol/L (ref 22–32)
Calcium: 8.5 mg/dL — ABNORMAL LOW (ref 8.9–10.3)
Calcium: 8.5 mg/dL — ABNORMAL LOW (ref 8.9–10.3)
Chloride: 97 mmol/L — ABNORMAL LOW (ref 101–111)
Chloride: 96 mmol/L — ABNORMAL LOW (ref 101–111)
Creatinine, Ser: 1.29 mg/dL — ABNORMAL HIGH (ref 0.44–1.00)
Creatinine, Ser: 1.26 mg/dL — ABNORMAL HIGH (ref 0.44–1.00)
GFR calc Af Amer: 46 mL/min — ABNORMAL LOW (ref >60)
GFR calc Af Amer: 47 mL/min — ABNORMAL LOW (ref >60)
GFR calc non Af Amer: 40 mL/min — ABNORMAL LOW (ref >60)
GFR calc non Af Amer: 41 mL/min — ABNORMAL LOW (ref >60)
Glucose, Bld: 99 mg/dL (ref 65–99)
Glucose, Bld: 89 mg/dL (ref 65–99)
Potassium: 3.2 mmol/L — ABNORMAL LOW (ref 3.5–5.1)
Potassium: 3.4 mmol/L — ABNORMAL LOW (ref 3.5–5.1)
Sodium: 135 mmol/L (ref 135–145)
Sodium: 136 mmol/L (ref 135–145)

## 2016-12-11 LAB — CBC WITH DIFFERENTIAL/PLATELET
Basophils Absolute: 0 10*3/uL (ref 0.0–0.1)
Basophils Relative: 1 %
Eosinophils Absolute: 0.3 10*3/uL (ref 0.0–0.7)
Eosinophils Relative: 5 %
HCT: 33.4 % — ABNORMAL LOW (ref 36.0–46.0)
Hemoglobin: 10.7 g/dL — ABNORMAL LOW (ref 12.0–15.0)
Lymphocytes Relative: 33 %
Lymphs Abs: 1.7 10*3/uL (ref 0.7–4.0)
MCH: 25.8 pg — ABNORMAL LOW (ref 26.0–34.0)
MCHC: 32 g/dL (ref 30.0–36.0)
MCV: 80.5 fL (ref 78.0–100.0)
Monocytes Absolute: 0.8 10*3/uL (ref 0.1–1.0)
Monocytes Relative: 15 %
Neutro Abs: 2.4 10*3/uL (ref 1.7–7.7)
Neutrophils Relative %: 46 %
Platelets: 388 10*3/uL (ref 150–400)
RBC: 4.15 MIL/uL (ref 3.87–5.11)
RDW: 15.2 % (ref 11.5–15.5)
WBC: 5.2 10*3/uL (ref 4.0–10.5)

## 2016-12-11 SURGERY — CARDIOVERSION
Anesthesia: General

## 2016-12-11 MED ORDER — ACETAMINOPHEN 160 MG/5ML PO SOLN: 325.0000 mg | ORAL | Status: DC | PRN

## 2016-12-11 MED ORDER — ACETAMINOPHEN 325 MG PO TABS
325.0000 mg | ORAL_TABLET | ORAL | Status: DC | PRN
Start: 2016-12-11 — End: 2016-12-11

## 2016-12-11 MED ORDER — PROPOFOL 10 MG/ML IV BOLUS
INTRAVENOUS | Status: DC | PRN
  Administered 2016-12-11 (×4): 10 mg via INTRAVENOUS

## 2016-12-11 MED ORDER — FUROSEMIDE 10 MG/ML IJ SOLN
80.0000 mg | Freq: Two times a day (BID) | INTRAMUSCULAR | Status: DC
  Administered 2016-12-11 – 2016-12-18 (×14): 80 mg via INTRAVENOUS
  Filled 2016-12-11 (×15): qty 8

## 2016-12-11 MED ORDER — SODIUM CHLORIDE 0.9 % IV SOLN
INTRAVENOUS | Status: DC
  Administered 2016-12-11 (×2): via INTRAVENOUS

## 2016-12-11 MED ORDER — MEPERIDINE HCL 25 MG/ML IJ SOLN: 6.2500 mg | INTRAMUSCULAR | Status: DC | PRN

## 2016-12-11 MED ORDER — EPHEDRINE SULFATE 50 MG/ML IJ SOLN
INTRAMUSCULAR | Status: DC | PRN
  Administered 2016-12-11 (×2): 5 mg via INTRAVENOUS

## 2016-12-11 MED ORDER — LIDOCAINE HCL (CARDIAC) 20 MG/ML IV SOLN
INTRAVENOUS | Status: DC | PRN
  Administered 2016-12-11: 40 mg via INTRATRACHEAL

## 2016-12-11 MED ORDER — OXYCODONE HCL 5 MG/5ML PO SOLN: 5.0000 mg | Freq: Once | ORAL | Status: DC | PRN

## 2016-12-11 MED ORDER — FENTANYL CITRATE (PF) 100 MCG/2ML IJ SOLN: 25.0000 ug | INTRAMUSCULAR | Status: DC | PRN

## 2016-12-11 MED ORDER — POTASSIUM CHLORIDE CRYS ER 20 MEQ PO TBCR
20.0000 meq | EXTENDED_RELEASE_TABLET | Freq: Two times a day (BID) | ORAL | Status: DC
  Administered 2016-12-11: 20 meq via ORAL
  Filled 2016-12-11: qty 1

## 2016-12-11 MED ORDER — OXYCODONE HCL 5 MG PO TABS: 5.0000 mg | ORAL_TABLET | Freq: Once | ORAL | Status: DC | PRN

## 2016-12-11 MED ORDER — ONDANSETRON HCL 4 MG/2ML IJ SOLN: 4.0000 mg | Freq: Once | INTRAMUSCULAR | Status: DC | PRN

## 2016-12-11 NOTE — Progress Notes (Signed)
Progress Note  Patient Name: Jenna [name] Date of Encounter: 12/11/2016  Primary Cardiologist: Hilty  Subjective   Sleepy after DCCV, not sure about SOB yet, getting up to go to BR, no chest pain, still with lower ext edema.    Inpatient Medications    Scheduled Meds: . ALPRAZolam  0.5 mg Oral BID  . amiodarone  400 mg Oral BID  . apixaban  5 mg Oral BID  . atorvastatin  40 mg Oral q1800  . buPROPion  150 mg Oral Daily  . cefTRIAXone (ROCEPHIN)  IV  1 g Intravenous Q24H  . furosemide  40 mg Intravenous BID  . insulin aspart  0-5 Units Subcutaneous QHS  . insulin aspart  0-9 Units Subcutaneous TID WC  . levothyroxine  88 mcg Oral QAC breakfast  . metoprolol tartrate  25 mg Oral BID  . pantoprazole (PROTONIX) IV  40 mg Intravenous Q24H  . rOPINIRole  1 mg Oral QHS   And  . rOPINIRole  0.5 mg Oral Q1200  . sodium chloride flush  3 mL Intravenous Q12H  . sodium chloride flush  3 mL Intravenous Q12H  . traMADol  50 mg Oral QHS   Continuous Infusions: . sodium chloride     PRN Meds: acetaminophen **OR** acetaminophen (TYLENOL) oral liquid 160 mg/5 mL, acetaminophen, fentaNYL (SUBLIMAZE) injection, meperidine (DEMEROL) injection, ondansetron (ZOFRAN) IV, oxyCODONE **OR** oxyCODONE, sodium chloride flush   Vital Signs    Vitals:   12/11/16 1135 12/11/16 1145 12/11/16 1155 12/11/16 1255  BP: 126/63 (!) 121/59 127/69 (!) 105/55  Pulse: (!) 51 (!) 48 (!) 50 (!) 55  Resp: 13 14 13 18   Temp:    97.8 F (36.6 C)  TempSrc:    Oral  SpO2: 96% 97% 100% 100%  Weight:      Height:        Intake/Output Summary (Last 24 hours) at 12/11/16 1521 Last data filed at 12/11/16 1122  Gross per 24 hour  Intake              640 ml  Output              800 ml  Net             -160 ml   Filed Weights   12/10/16 0500 12/11/16 0500 12/11/16 0957  Weight: 230 lb (104.3 kg) 227 lb 8 oz (103.2 kg) 227 lb (103 kg)    Telemetry    SR to SB 49 to 66 since DCCV - Personally  Reviewed  ECG    Today post DCCV--SB at 45 no acute changes from previous EKGs in SR  - Personally Reviewed  Physical Exam   GEN: No acute distress. Sleepy  Neck: No JVD Cardiac: RRR, no murmurs, rubs, or gallops.  Respiratory: Clear to auscultation bilaterally. GI: Soft, nontender, non-distended  MS: 2+ LOWER ext edema; No deformity. Neuro:  Nonfocal  Psych: Normal affect   Labs    Chemistry Recent Labs Lab 12/10/16 1927 12/11/16 0540 12/11/16 0752  NA 134* 135 136  K 2.8* 3.2* 3.4*  CL 94* 97* 96*  CO2 30 28 30   GLUCOSE 116* 99 89  BUN 42* 38* 38*  CREATININE 1.48* 1.29* 1.26*  CALCIUM 8.2* 8.5* 8.5*  GFRNONAA 34* 40* 41*  GFRAA 39* 46* 47*  ANIONGAP 10 10 10      Hematology Recent Labs Lab 12/07/16 2135 12/11/16 0540  WBC 10.9* 5.2  RBC 4.59 4.15  HGB 12.0 10.7*  HCT 37.1 33.4*  MCV 80.8 80.5  MCH 26.1 25.8*  MCHC 32.3 32.0  RDW 14.7 15.2  PLT 458* 388    Cardiac Enzymes Recent Labs Lab 12/07/16 2135 12/08/16 0234 12/08/16 1430  TROPONINI <0.03 <0.03 <0.03   No results for input(s): TROPIPOC in the last 168 hours.   BNP Recent Labs Lab 12/07/16 2200 12/08/16 0234  BNP 1,423.9* 1,372.6*     DDimer No results for input(s): DDIMER in the last 168 hours.   Radiology    No results found.  Cardiac Studies   2D Echo 11/16/2016: Left ventricle: The cavity size was mildly dilated. Wall thickness was normal. Systolic function was moderately reduced. The estimated ejection fraction was in the range of 35% to 40%.  ------------------------------------------------------------------- Aortic valve: Moderately thickened, mildly calcified leaflets. Doppler: There was mild stenosis. There was no significant regurgitation. VTI ratio of LVOT to aortic valve: 0.28. Valve area (VTI): 0.71 cm^2. Indexed valve area (VTI): 0.33 cm^2/m^2. Peak velocity ratio of LVOT to aortic valve: 0.27. Valve area (Vmax): 0.7 cm^2. Indexed valve area  (Vmax): 0.32 cm^2/m^2. Mean velocity ratio of LVOT to aortic valve: 0.26. Valve area (Vmean): 0.67 cm^2. Indexed valve area (Vmean): 0.31 cm^2/m^2. Mean gradient (S): 11 mm Hg. Peak gradient (S): 18 mm Hg.  ------------------------------------------------------------------- Aorta: Aortic root: The aortic root was normal in size. Ascending aorta: The ascending aorta was normal in size.  ------------------------------------------------------------------- Mitral valve: Mildly thickened leaflets . Doppler: There was mild to moderate regurgitation.  ------------------------------------------------------------------- Left atrium: The atrium was normal in size.  ------------------------------------------------------------------- Right ventricle: The cavity size was normal. Systolic function was normal.  ------------------------------------------------------------------- Pulmonic valve: The valve appears to be grossly normal. Doppler: There was no significant regurgitation.  ------------------------------------------------------------------- Tricuspid valve: The valve appears to be grossly normal. Doppler: There was moderate regurgitation.  ------------------------------------------------------------------- Right atrium: The atrium was normal in size.  ------------------------------------------------------------------- Pericardium: There was no pericardial effusion.  TEE 11-19-2016: Study Conclusions  - Left ventricle: The cavity size was dilated. Systolic function was severely reduced. The estimated ejection fraction was in the range of 20% to 25%. Diffuse hypokinesis. - Aortic valve: Valve mobility was restricted. No evidence of vegetation. There was mild regurgitation. - Mitral valve: No evidence of vegetation. There was mild to moderate regurgitation. - Left atrium: The atrium was moderately dilated. No evidence of thrombus in the atrial  cavity or appendage. - Right ventricle: The cavity size was mildly dilated. Systolic function was severely reduced. - Right atrium: The atrium was moderately dilated. - Atrial septum: No defect or patent foramen ovale was identified. - Tricuspid valve: No evidence of vegetation. There was moderate regurgitation. - Pulmonic valve: No evidence of vegetation.  Impressions:  - Severe global reduction in LV function; 4 chamber enlargement; No LAA thrombus; severely reduced RV function; calcified aortic valve with reduced cusp excursion; mild AI; mild to moderate MR; moderate TR.  Patient Profile     76 y.o. female with cardiomyopathy, chronic systolic CHF, mild AS, GERD, HTN, persistent atrial fibrillation, OA, OSA on CPAP admitted with CHF, recurrent atrial fib.   Assessment & Plan    1. Acute on chronic systolic heart failure/Non-ischemic cardiomyopathy: She is volume overloaded on exam. She is diuresing with IV Lasix. She is known to have severe LV systolic dysfunction, presumed non-ischemic cardiomyopathy with CHF likely exacerbated by atrial fib.  -Will continue IV lasix today change to po tomorrow.  -Continue low dose beta blocker vs stop with HR to 49 at times, post  DCCV was 3.  -neg 857 since admit and wt is down to 227 from 232.    2. Persistent atrial fibrillation with RVR: The patient had undergone TEE/cardioversion recently, but reverted back into atrial fibrillation within a few days. She is now fully loaded with amiodarone. She has been on Eliquis. -successful DCCV today.  -Will continue amiodarone and ? beta blocker.   3. Acute on chronic kidney injury: Will follow creatinine closely while we diurese her.  Cr stable at 1.26, GFR at 41    Signed, Cecilie Kicks, NP  12/11/2016, 3:21 PM    Patient seen, examined. Available data reviewed. Agree with findings, assessment, and plan as outlined by Cecilie Kicks, NP. The patient is independently interviewed and  examined. Heart is bradycardic and regular without murmur, lungs are clear, abdomen is soft, obese and nontender, extremities with 2+ pretibial edema bilaterally. The patient is somnolent after sedation for cardioversion. She otherwise is doing fine. We'll continue IV diuresis. Will stop her beta blocker because of resting bradycardia now that she is in sinus rhythm. She will continue on anticoagulation. Continue amiodarone. Hopefully she will diurese better now that her heart is back and rhythm.  Sherren Mocha, M.D. 12/11/2016 3:56 PM

## 2016-12-11 NOTE — CV Procedure (Addendum)
     Cardioversion Note  Jenna [name] [id number] 1941/02/18  Procedure: DC Cardioversion Indications: atrial fibrillation  Procedure Details Consent: Obtained Time Out: Verified patient identification, verified procedure, site/side was marked, verified correct patient position, special equipment/implants available, Radiology Safety Procedures followed,  medications/allergies/relevent history reviewed, required imaging and test results available.  Performed  The patient has been on adequate anticoagulation.  The patient received IV propofol administered by anesthesia staff for sedation.  Synchronous cardioversion was performed at 120 and 150 joules.  The cardioversion was successful. The cardioversion was followed by a junctional bradycardia with ventricular rate in 40', that resolved into sinus rhythm.    Complications: No apparent complications Patient did tolerate procedure well.   [name], MD, [facility] 12/11/2016, 1:08 PM

## 2016-12-11 NOTE — H&P (View-Only) (Signed)
Progress Note  Patient Name: Jenna [name] Date of Encounter: 12/10/2016  Primary Cardiologist: Hilty/Croituro  Subjective   No chest pain. She has dyspnea with minimal exertion.   Inpatient Medications    Scheduled Meds: . ALPRAZolam  0.5 mg Oral BID  . amiodarone  400 mg Oral BID  . apixaban  5 mg Oral BID  . atorvastatin  40 mg Oral q1800  . buPROPion  150 mg Oral Daily  . cefTRIAXone (ROCEPHIN)  IV  1 g Intravenous Q24H  . furosemide  40 mg Intravenous BID  . insulin aspart  0-5 Units Subcutaneous QHS  . insulin aspart  0-9 Units Subcutaneous TID WC  . levothyroxine  88 mcg Oral QAC breakfast  . metoprolol tartrate  25 mg Oral BID  . pantoprazole (PROTONIX) IV  40 mg Intravenous Q24H  . rOPINIRole  0.5 mg Oral Q1200   And  . rOPINIRole  0.5-1 mg Oral QHS  . sodium chloride flush  3 mL Intravenous Q12H  . traMADol  50 mg Oral QHS   Continuous Infusions:  PRN Meds: acetaminophen   Vital Signs    Vitals:   12/09/16 2011 12/09/16 2202 12/10/16 0413 12/10/16 0500  BP: 91/64 111/83 103/74   Pulse: (!) 103  94   Resp: 18  16   Temp: 98.3 F (36.8 C)  98.4 F (36.9 C)   TempSrc: Oral  Oral   SpO2: 96%  98%   Weight:    230 lb (104.3 kg)  Height:        Intake/Output Summary (Last 24 hours) at 12/10/16 0853 Last data filed at 12/09/16 2204  Gross per 24 hour  Intake              103 ml  Output              700 ml  Net             -597 ml   Filed Weights   12/07/16 2231 12/08/16 1125 12/10/16 0500  Weight: 232 lb 5.8 oz (105.4 kg) 232 lb 5.8 oz (105.4 kg) 230 lb (104.3 kg)    Telemetry    Atrial fib, rate 90s - Personally Reviewed  ECG    N/A  Physical Exam   GEN: No acute distress.   Neck: + JVD Cardiac: irreg irreg, no murmurs, rubs, or gallops.  Respiratory: Clear to auscultation bilaterally. GI: Soft, nontender, non-distended  Ext: 2+ bilateral LE edema Neuro:  Nonfocal  Psych: Normal affect   Labs    Chemistry Recent Labs Lab  12/09/16 0724 12/09/16 1753 12/10/16 0447  NA 132* 131* 133*  K 4.9 4.3 3.9  CL 99* 93* 97*  CO2 28 25 25   GLUCOSE 87 136* 108*  BUN 44* 46* 43*  CREATININE 1.80* 1.69* 1.49*  CALCIUM 8.6* 8.8* 8.6*  GFRNONAA 27* 29* 33*  GFRAA 31* 33* 39*  ANIONGAP 5 13 11      Hematology Recent Labs Lab 12/07/16 2135  WBC 10.9*  RBC 4.59  HGB 12.0  HCT 37.1  MCV 80.8  MCH 26.1  MCHC 32.3  RDW 14.7  PLT 458*    Cardiac Enzymes Recent Labs Lab 12/07/16 2135 12/08/16 0234 12/08/16 1430  TROPONINI <0.03 <0.03 <0.03   No results for input(s): TROPIPOC in the last 168 hours.   BNP Recent Labs Lab 12/07/16 2200 12/08/16 0234  BNP 1,423.9* 1,372.6*     DDimer No results for input(s): DDIMER in the last 168  hours.   Radiology    No results found.  Cardiac Studies   2D Echo 11/16/2016: Left ventricle: The cavity size was mildly dilated. Wall thickness was normal. Systolic function was moderately reduced. The estimated ejection fraction was in the range of 35% to 40%.  ------------------------------------------------------------------- Aortic valve: Moderately thickened, mildly calcified leaflets. Doppler: There was mild stenosis. There was no significant regurgitation. VTI ratio of LVOT to aortic valve: 0.28. Valve area (VTI): 0.71 cm^2. Indexed valve area (VTI): 0.33 cm^2/m^2. Peak velocity ratio of LVOT to aortic valve: 0.27. Valve area (Vmax): 0.7 cm^2. Indexed valve area (Vmax): 0.32 cm^2/m^2. Mean velocity ratio of LVOT to aortic valve: 0.26. Valve area (Vmean): 0.67 cm^2. Indexed valve area (Vmean): 0.31 cm^2/m^2. Mean gradient (S): 11 mm Hg. Peak gradient (S): 18 mm Hg.  ------------------------------------------------------------------- Aorta: Aortic root: The aortic root was normal in size. Ascending aorta: The ascending aorta was normal in size.  ------------------------------------------------------------------- Mitral valve: Mildly  thickened leaflets . Doppler: There was mild to moderate regurgitation.  ------------------------------------------------------------------- Left atrium: The atrium was normal in size.  ------------------------------------------------------------------- Right ventricle: The cavity size was normal. Systolic function was normal.  ------------------------------------------------------------------- Pulmonic valve: The valve appears to be grossly normal. Doppler: There was no significant regurgitation.  ------------------------------------------------------------------- Tricuspid valve: The valve appears to be grossly normal. Doppler: There was moderate regurgitation.  ------------------------------------------------------------------- Right atrium: The atrium was normal in size.  ------------------------------------------------------------------- Pericardium: There was no pericardial effusion.  TEE 11-19-2016: Study Conclusions  - Left ventricle: The cavity size was dilated. Systolic function was severely reduced. The estimated ejection fraction was in the range of 20% to 25%. Diffuse hypokinesis. - Aortic valve: Valve mobility was restricted. No evidence of vegetation. There was mild regurgitation. - Mitral valve: No evidence of vegetation. There was mild to moderate regurgitation. - Left atrium: The atrium was moderately dilated. No evidence of thrombus in the atrial cavity or appendage. - Right ventricle: The cavity size was mildly dilated. Systolic function was severely reduced. - Right atrium: The atrium was moderately dilated. - Atrial septum: No defect or patent foramen ovale was identified. - Tricuspid valve: No evidence of vegetation. There was moderate regurgitation. - Pulmonic valve: No evidence of vegetation.  Impressions:  - Severe global reduction in LV function; 4 chamber enlargement; No LAA thrombus; severely reduced RV  function; calcified aortic valve with reduced cusp excursion; mild AI; mild to moderate MR; moderate TR.  Patient Profile     76 y.o. female with cardiomyopathy, chronic systolic CHF, mild AS, GERD, HTN, persistent atrial fibrillation, OA, OSA on CPAP admitted with CHF, recurrent atrial fib.   Assessment & Plan    1. Acute on chronic systolic heart failure/Non-ischemic cardiomyopathy: She is volume overloaded on exam. She is diuresing with IV Lasix. She is known to have severe LV systolic dysfunction, presumed non-ischemic cardiomyopathy with CHF likely exacerbated by atrial fib.  -Will continue IV lasix today.  -Continue low dose beta blocker.   2. Persistent atrial fibrillation with RVR: The patient has undergone TEE/cardioversion recently, but reverted back into atrial fibrillation within a few days. She is now fully loaded with amiodarone. She has been on Eliquis. Will attempt repeat DCCV tomorrow based on scheduling.  -NPO at midnight.  -Will continue amiodarone and beta blocker.   3. Acute on chronic kidney injury: Will follow creatinine closely while we diurese her.  Signed, Lauree Chandler, MD  12/10/2016, 8:53 AM

## 2016-12-11 NOTE — Progress Notes (Signed)
Patient received AM dose of eliquis after arrival to endoscopy for cardioversion today. Patient reports missing PM dose of eliquis 12/07/16. States this is only dose she has missed in past month. Dr. Meda Coffee is aware and states ok to proceed with cardioversion today.

## 2016-12-11 NOTE — Interval H&P Note (Signed)
History and Physical Interval Note:  12/11/2016 10:06 AM  Jenna [name]  has presented today for surgery, with the diagnosis of AFIB  The various methods of treatment have been discussed with the patient and family. After consideration of risks, benefits and other options for treatment, the patient has consented to  Procedure(s): CARDIOVERSION (N/A) as a surgical intervention .  The patient's history has been reviewed, patient examined, no change in status, stable for surgery.  I have reviewed the patient's chart and labs.  Questions were answered to the patient's satisfaction.     [name]

## 2016-12-11 NOTE — Anesthesia Preprocedure Evaluation (Addendum)
Anesthesia Evaluation  Patient identified by MRN, date of birth, ID band Patient awake    Reviewed: Allergy & Precautions, NPO status , Patient's Chart, lab work & pertinent test results  Airway Mallampati: II  TM Distance: >3 FB Neck ROM: Full    Dental no notable dental hx.    Pulmonary sleep apnea and Continuous Positive Airway Pressure Ventilation ,    Pulmonary exam normal breath sounds clear to auscultation       Cardiovascular hypertension, Pt. on medications Normal cardiovascular exam+ dysrhythmias Atrial Fibrillation  Rhythm:Regular Rate:Normal     Neuro/Psych negative neurological ROS     GI/Hepatic Neg liver ROS, GERD  Medicated and Controlled,  Endo/Other  diabetes, Type 2  Renal/GU Renal InsufficiencyRenal disease  negative genitourinary   Musculoskeletal   Abdominal (+) + obese,   Peds negative pediatric ROS (+)  Hematology negative hematology ROS (+)   Anesthesia Other Findings   Reproductive/Obstetrics negative OB ROS                             Anesthesia Physical  Anesthesia Plan  ASA: III  Anesthesia Plan: General   Post-op Pain Management:    Induction: Intravenous  Airway Management Planned: Nasal Cannula and Simple Face Mask  Additional Equipment:   Intra-op Plan:   Post-operative Plan:   Informed Consent: I have reviewed the patients History and Physical, chart, labs and discussed the procedure including the risks, benefits and alternatives for the proposed anesthesia with the patient or authorized representative who has indicated his/her understanding and acceptance.   Dental advisory given  Plan Discussed with: CRNA and Surgeon  Anesthesia Plan Comments:         Anesthesia Quick Evaluation

## 2016-12-11 NOTE — Transfer of Care (Signed)
Immediate Anesthesia Transfer of Care Note  Patient: Jenna [name]  Procedure(s) Performed: Procedure(s): CARDIOVERSION (N/A)  Patient Location: PACU  Anesthesia Type:MAC  Level of Consciousness: awake  Airway & Oxygen Therapy: Patient Spontanous Breathing  Post-op Assessment: Report given to RN, Post -op Vital signs reviewed and stable and Patient moving all extremities X 4  Post vital signs: Reviewed and stable  Last Vitals:  Vitals:   12/11/16 0957 12/11/16 1123  BP: 119/69 (!) 111/52  Pulse:  (!) 48  Resp: 15 12  Temp: 36.6 C 36.4 C    Last Pain:  Vitals:   12/11/16 1123  TempSrc: Oral  PainSc:          Complications: No apparent anesthesia complications

## 2016-12-11 NOTE — Anesthesia Postprocedure Evaluation (Addendum)
Anesthesia Post Note  Patient: Jenna [name]  Procedure(s) Performed: Procedure(s) (LRB): CARDIOVERSION (N/A)  Patient location during evaluation: PACU Anesthesia Type: General Level of consciousness: awake and sedated Pain management: pain level controlled Vital Signs Assessment: post-procedure vital signs reviewed and stable Respiratory status: spontaneous breathing Cardiovascular status: stable Postop Assessment: no signs of nausea or vomiting Anesthetic complications: no       Last Vitals:  Vitals:   12/11/16 1145 12/11/16 1155  BP: (!) 121/59 127/69  Pulse: (!) 48 (!) 50  Resp: 14 13  Temp:      Last Pain:  Vitals:   12/11/16 1123  TempSrc: Oral  PainSc:                  Taiana Temkin JR,JOHN Macayla Ekdahl

## 2016-12-12 ENCOUNTER — Encounter (HOSPITAL_COMMUNITY): Payer: Self-pay | Admitting: Cardiology

## 2016-12-12 LAB — GLUCOSE, CAPILLARY
Glucose-Capillary: 89 mg/dL (ref 65–99)
Glucose-Capillary: 115 mg/dL — ABNORMAL HIGH (ref 65–99)
Glucose-Capillary: 123 mg/dL — ABNORMAL HIGH (ref 65–99)
Glucose-Capillary: 101 mg/dL — ABNORMAL HIGH (ref 65–99)

## 2016-12-12 LAB — BASIC METABOLIC PANEL
Anion gap: 12 (ref 5–15)
BUN: 34 mg/dL — ABNORMAL HIGH (ref 6–20)
CO2: 30 mmol/L (ref 22–32)
Calcium: 8.4 mg/dL — ABNORMAL LOW (ref 8.9–10.3)
Chloride: 93 mmol/L — ABNORMAL LOW (ref 101–111)
Creatinine, Ser: 1.41 mg/dL — ABNORMAL HIGH (ref 0.44–1.00)
GFR calc Af Amer: 41 mL/min — ABNORMAL LOW (ref >60)
GFR calc non Af Amer: 36 mL/min — ABNORMAL LOW (ref >60)
Glucose, Bld: 129 mg/dL — ABNORMAL HIGH (ref 65–99)
Potassium: 2.9 mmol/L — ABNORMAL LOW (ref 3.5–5.1)
Sodium: 135 mmol/L (ref 135–145)

## 2016-12-12 MED ORDER — POTASSIUM CHLORIDE CRYS ER 20 MEQ PO TBCR
40.0000 meq | EXTENDED_RELEASE_TABLET | Freq: Three times a day (TID) | ORAL | Status: DC
  Administered 2016-12-12 – 2016-12-18 (×19): 40 meq via ORAL
  Filled 2016-12-12 (×19): qty 2

## 2016-12-12 NOTE — Care Management Important Message (Signed)
Important Message  Patient Details  Name: Jenna [name] MRN: [id number] Date of Birth: [birth year]   Medicare Important Message Given:  Yes    Nathen May 12/12/2016, 3:16 PM

## 2016-12-12 NOTE — Progress Notes (Addendum)
Progress Note  Patient Name: Jenna [name] Date of Encounter: 12/12/2016  Primary Cardiologist: Hilty  Subjective   Feeling okay. No chest pain or shortness of breath at rest. Sitting up at the bedside eating breakfast.  Inpatient Medications    Scheduled Meds: . ALPRAZolam  0.5 mg Oral BID  . amiodarone  400 mg Oral BID  . apixaban  5 mg Oral BID  . atorvastatin  40 mg Oral q1800  . buPROPion  150 mg Oral Daily  . cefTRIAXone (ROCEPHIN)  IV  1 g Intravenous Q24H  . furosemide  80 mg Intravenous BID  . insulin aspart  0-5 Units Subcutaneous QHS  . insulin aspart  0-9 Units Subcutaneous TID WC  . levothyroxine  88 mcg Oral QAC breakfast  . pantoprazole (PROTONIX) IV  40 mg Intravenous Q24H  . potassium chloride  20 mEq Oral BID  . rOPINIRole  1 mg Oral QHS   And  . rOPINIRole  0.5 mg Oral Q1200  . sodium chloride flush  3 mL Intravenous Q12H  . sodium chloride flush  3 mL Intravenous Q12H  . traMADol  50 mg Oral QHS   Continuous Infusions: . sodium chloride     PRN Meds: acetaminophen, sodium chloride flush   Vital Signs    Vitals:   12/11/16 1255 12/11/16 2205 12/12/16 0127 12/12/16 0446  BP: (!) 105/55 124/73  104/61  Pulse: (!) 55 72  (!) 53  Resp: 18 18  18   Temp: 97.8 F (36.6 C) 98.4 F (36.9 C)  98.1 F (36.7 C)  TempSrc: Oral Oral  Oral  SpO2: 100% 99%  100%  Weight:   235 lb (106.6 kg)   Height:        Intake/Output Summary (Last 24 hours) at 12/12/16 2549 Last data filed at 12/11/16 2313  Gross per 24 hour  Intake              640 ml  Output             1000 ml  Net             -360 ml   Filed Weights   12/11/16 0500 12/11/16 0957 12/12/16 0127  Weight: 227 lb 8 oz (103.2 kg) 227 lb (103 kg) 235 lb (106.6 kg)    Telemetry    Sinus bradycardia heart rate 50 bpm, no episodes of breakthrough atrial fibrillation - Personally Reviewed   Physical Exam  Sitting up at the bedside, comfortable GEN: No acute distress.   Neck: JVP  elevated Cardiac: brady and regular, no murmurs, rubs, or gallops.  Respiratory: Clear to auscultation bilaterally. GI: Soft, nontender, non-distended  MS: 2+ bilateral edema; No deformity. Neuro:  Nonfocal  Psych: Normal affect   Labs    Chemistry Recent Labs Lab 12/11/16 0540 12/11/16 0752 12/12/16 0222  NA 135 136 135  K 3.2* 3.4* 2.9*  CL 97* 96* 93*  CO2 28 30 30   GLUCOSE 99 89 129*  BUN 38* 38* 34*  CREATININE 1.29* 1.26* 1.41*  CALCIUM 8.5* 8.5* 8.4*  GFRNONAA 40* 41* 36*  GFRAA 46* 47* 41*  ANIONGAP 10 10 12      Hematology Recent Labs Lab 12/07/16 2135 12/11/16 0540  WBC 10.9* 5.2  RBC 4.59 4.15  HGB 12.0 10.7*  HCT 37.1 33.4*  MCV 80.8 80.5  MCH 26.1 25.8*  MCHC 32.3 32.0  RDW 14.7 15.2  PLT 458* 388    Cardiac Enzymes Recent Labs Lab 12/07/16  2135 12/08/16 0234 12/08/16 1430  TROPONINI <0.03 <0.03 <0.03   No results for input(s): TROPIPOC in the last 168 hours.   BNP Recent Labs Lab 12/07/16 2200 12/08/16 0234  BNP 1,423.9* 1,372.6*     DDimer No results for input(s): DDIMER in the last 168 hours.   Radiology    No results found.   Patient Profile     76 y.o. female with cardiomyopathy, chronic systolic CHF, mild AS, GERD, HTN, persistent atrial fibrillation, OA, OSA on CPAP admitted with CHF, recurrent atrial fib.   Assessment & Plan    1. Acute on chronic systolic CHF: Remains volume overloaded on exam. Lasix has been increased to 80 mg IV twice daily. Blood pressure will not allow for more aggressive diuresis. Hopefully now that she is back in sinus rhythm diuresis may be augmented. Need to replace potassium which is 2.9 this morning. Will recheck BNP tomorrow morning. Recheck electrolytes/creatinine tomorrow morning. With low blood pressure at present, unable to add ACE/ARB.  2. Persistent atrial fibrillation: now s/p DCCV maintaining sinus rhythm after amiodarone loading. No room for beta blocker with resting bradycardia 50  bpm.  3. Cystitis: Reviewed urine culture data and there is no growth. No clinical signs of infection. Will stop Rocephin.  4. Generalized weakness: feels weak in legs, lives alone, concerned about stability. Will request formal PT eval.   Signed, Sherren Mocha, MD  12/12/2016, 7:12 AM

## 2016-12-13 LAB — GLUCOSE, CAPILLARY
Glucose-Capillary: 101 mg/dL — ABNORMAL HIGH (ref 65–99)
Glucose-Capillary: 122 mg/dL — ABNORMAL HIGH (ref 65–99)
Glucose-Capillary: 137 mg/dL — ABNORMAL HIGH (ref 65–99)
Glucose-Capillary: 61 mg/dL — ABNORMAL LOW (ref 65–99)
Glucose-Capillary: 101 mg/dL — ABNORMAL HIGH (ref 65–99)

## 2016-12-13 LAB — BASIC METABOLIC PANEL
Anion gap: 11 (ref 5–15)
BUN: 32 mg/dL — ABNORMAL HIGH (ref 6–20)
CO2: 30 mmol/L (ref 22–32)
Calcium: 8.4 mg/dL — ABNORMAL LOW (ref 8.9–10.3)
Chloride: 95 mmol/L — ABNORMAL LOW (ref 101–111)
Creatinine, Ser: 1.48 mg/dL — ABNORMAL HIGH (ref 0.44–1.00)
GFR calc Af Amer: 39 mL/min — ABNORMAL LOW (ref >60)
GFR calc non Af Amer: 34 mL/min — ABNORMAL LOW (ref >60)
Glucose, Bld: 143 mg/dL — ABNORMAL HIGH (ref 65–99)
Potassium: 3.7 mmol/L (ref 3.5–5.1)
Sodium: 136 mmol/L (ref 135–145)

## 2016-12-13 LAB — CULTURE, BLOOD (ROUTINE X 2)
Culture: NO GROWTH
Culture: NO GROWTH

## 2016-12-13 LAB — BRAIN NATRIURETIC PEPTIDE: B Natriuretic Peptide: 802 pg/mL — ABNORMAL HIGH (ref 0.0–100.0)

## 2016-12-13 MED ORDER — METOLAZONE 5 MG PO TABS
5.0000 mg | ORAL_TABLET | Freq: Once | ORAL | Status: AC
  Administered 2016-12-13: 5 mg via ORAL
  Filled 2016-12-13: qty 1

## 2016-12-13 NOTE — Progress Notes (Signed)
Progress Note  Patient Name: Jenna [name] Date of Encounter: 12/13/2016  Primary Cardiologist: Hilty  Subjective   Feeling okay. No chest pain or shortness of breath at rest. Sitting up in a chair now. She gets SOB, weak and feels shaky with ambulation.  Inpatient Medications    Scheduled Meds: . ALPRAZolam  0.5 mg Oral BID  . amiodarone  400 mg Oral BID  . apixaban  5 mg Oral BID  . atorvastatin  40 mg Oral q1800  . buPROPion  150 mg Oral Daily  . furosemide  80 mg Intravenous BID  . insulin aspart  0-5 Units Subcutaneous QHS  . insulin aspart  0-9 Units Subcutaneous TID WC  . levothyroxine  88 mcg Oral QAC breakfast  . pantoprazole (PROTONIX) IV  40 mg Intravenous Q24H  . potassium chloride  40 mEq Oral TID  . rOPINIRole  1 mg Oral QHS   And  . rOPINIRole  0.5 mg Oral Q1200  . sodium chloride flush  3 mL Intravenous Q12H  . traMADol  50 mg Oral QHS   Continuous Infusions: . sodium chloride     PRN Meds: acetaminophen, sodium chloride flush   Vital Signs    Vitals:   12/12/16 1345 12/12/16 2137 12/13/16 0600 12/13/16 1000  BP: (!) 106/53 (!) 101/54 (!) 117/54 (!) 125/49  Pulse: 62 (!) 55 (!) 56 62  Resp: 17 18 20    Temp: 98 F (36.7 C) 97.8 F (36.6 C) 98.2 F (36.8 C)   TempSrc: Oral Oral Oral   SpO2: 96% 98% 97%   Weight:   235 lb 8 oz (106.8 kg)   Height:       **Accurate weight at lunch, 102.8 kg  Intake/Output Summary (Last 24 hours) at 12/13/16 1211 Last data filed at 12/13/16 1100  Gross per 24 hour  Intake              240 ml  Output             2100 ml  Net            -1860 ml   Filed Weights   12/11/16 0957 12/12/16 0127 12/13/16 0600  Weight: 227 lb (103 kg) 235 lb (106.6 kg) 235 lb 8 oz (106.8 kg)    Telemetry    Sinus bradycardia heart rate 50 bpm, no episodes of breakthrough atrial fibrillation - Personally Reviewed   Physical Exam  Sitting up at the bedside, comfortable GEN: No acute distress.   Neck: JVP elevated Cardiac:  brady and regular, soft murmur, no rubs, or gallops.  Respiratory: Clear to auscultation bilaterally. GI: Soft, nontender, non-distended  MS: 2+ bilateral edema; No deformity. Neuro:  Nonfocal  Psych: Normal affect   Labs    Chemistry  Recent Labs Lab 12/11/16 0752 12/12/16 0222 12/13/16 0158  NA 136 135 136  K 3.4* 2.9* 3.7  CL 96* 93* 95*  CO2 30 30 30   GLUCOSE 89 129* 143*  BUN 38* 34* 32*  CREATININE 1.26* 1.41* 1.48*  CALCIUM 8.5* 8.4* 8.4*  GFRNONAA 41* 36* 34*  GFRAA 47* 41* 39*  ANIONGAP 10 12 11      Hematology  Recent Labs Lab 12/07/16 2135 12/11/16 0540  WBC 10.9* 5.2  RBC 4.59 4.15  HGB 12.0 10.7*  HCT 37.1 33.4*  MCV 80.8 80.5  MCH 26.1 25.8*  MCHC 32.3 32.0  RDW 14.7 15.2  PLT 458* 388    Cardiac Enzymes Recent Labs Lab 12/07/16  2135 12/08/16 0234 12/08/16 1430  TROPONINI <0.03 <0.03 <0.03    BNP Recent Labs Lab 12/07/16 2200 12/08/16 0234 12/13/16 0158  BNP 1,423.9* 1,372.6* 802.0*     Radiology    No results found.   Patient Profile     76 y.o. female with cardiomyopathy, chronic systolic CHF, mild AS, GERD, HTN, persistent atrial fibrillation, OA, OSA on CPAP admitted 03/04 with CHF, recurrent atrial fib.   Assessment & Plan    1. Acute on chronic systolic CHF: Remains volume overloaded on exam. - Lasix increased to 80 mg IV bid on 03/07.  - Blood pressure will not allow for more aggressive diuresis. Hopefully now that she is back in sinus rhythm diuresis may be augmented.  - wt is trending up, went from 227>>235 on 03/08.  - Staff to recheck wt today, important to get accurate wts and I/O  - if not diuresing, may need to add Zaroxolyn or change to Lasix gtt - Continue to replace potassium, was 2.9 on 03/08, s/p 120 meq and now 3.7. - BNP still elevated but trending down. - Recheck electrolytes/creatinine tomorrow morning.  - With low blood pressure at present, unable to add ACE/ARB. - BUN slight trend down, Cr  trending up, follow  2. Persistent atrial fibrillation: now s/p DCCV maintaining sinus rhythm after amiodarone loading.  - Both atrial normal in size.  - No room for beta blocker with resting bradycardia 50 bpm.  - Frequent PACs.  - Amio at 400 mg bid was started on 02/21, has had more than 10 gm load.  - think ok to decrease dose, MD advise on 400 mg or 200 mg qd.  3. Cystitis: Reviewed urine culture data and there is no growth. No clinical signs of infection. Off Rocephin.  4. Generalized weakness: feels weak in legs, lives alone, concerned about stability. PT rec SNF, pt agreeable. Perhaps Monday.  Jenna Speak, PA-C  12/13/2016, 12:11 PM    Patient seen, examined. Available data reviewed. Agree with findings, assessment, and plan as outlined by Jenna Ferries, PA-C. A&O, NAD, lungs CTA, JVP moderately elevated, CV: brady and regular no murmur, ext: 2+ edema bilaterally.   Labs reviewed. BNP improving. I/O's not accurate. Weights trending down but still volume overloaded.   Plan: add torsemide 5 mg x 1 prior to next lasix dose. Continue IV lasix.   No beta blocker - resting bradycardia.   Decrease amiodarone 200 mg daily  No ACE as she has had AKI and need to continue aggressive diuresis  Jenna [name], M.D. 12/13/2016 4:55 PM

## 2016-12-13 NOTE — Evaluation (Signed)
Physical Therapy Evaluation Patient Details Name: Jenna [name] MRN: [id number] DOB: 1941-02-17 Today's Date: 12/13/2016   History of Present Illness  76 y.o.femaleadmitted with CHF, recurrent atrial fib. PMH: cardiomyopathy, chronic systolic CHF, mild AS, HTN, persistent atrial fibrillation, diabetes, restless leg syndrome, RVR, chronic low back pain.  Clinical Impression  Pt seen for PT initial evaluation and treatment. Pt reports that she does live alone and has very limited family support. PTA she was having difficulty taking care of herself. At this time the pt was able to ambulate 20 ft with rw and min guard assistance. Pt c/o LE weakness and requiring a seated rest break. Based upon the patient's current mobility level, recommending short SNF stay for further rehabilitation prior to returning home. Pt in agreement. PT to continue to follow to progress mobility and safety.     Follow Up Recommendations SNF    Equipment Recommendations  None recommended by PT    Recommendations for Other Services       Precautions / Restrictions Precautions Precautions: Fall Restrictions Weight Bearing Restrictions: No      Mobility  Bed Mobility               General bed mobility comments: sitting in chair upon arrival  Transfers Overall transfer level: Needs assistance Equipment used: Rolling walker (2 wheeled) Transfers: Sit to/from Stand Sit to Stand: Min guard         General transfer comment: cues for hand placement when sitting  Ambulation/Gait Ambulation/Gait assistance: Min guard Ambulation Distance (Feet): 20 Feet Assistive device: Rolling walker (2 wheeled) Gait Pattern/deviations: Step-through pattern;Decreased stride length;Trunk flexed Gait velocity: decreased   General Gait Details: slow pattern, cues for posture. Pt reports legs feeling weak.  Stairs            Wheelchair Mobility    Modified Rankin (Stroke Patients Only)       Balance  Overall balance assessment: Needs assistance Sitting-balance support: No upper extremity supported Sitting balance-Leahy Scale: Good     Standing balance support: During functional activity Standing balance-Leahy Scale: Fair Standing balance comment: using rw for ambulation                             Pertinent Vitals/Pain Pain Assessment: No/denies pain    Home Living Family/patient expects to be discharged to:: Unsure Living Arrangements: Alone   Type of Home: House Home Access: Stairs to enter Entrance Stairs-Rails: Left Entrance Stairs-Number of Steps: 1 Home Layout: One level Home Equipment: Walker - 2 wheels Additional Comments: son lives behind her but works during the day.     Prior Function Level of Independence: Independent with assistive device(s)         Comments: using rw for ambulation. Taking care of all ADL/IADLs but slow and requiring increased time. Spending most of the day sitting in chair     Hand Dominance        Extremity/Trunk Assessment   Upper Extremity Assessment Upper Extremity Assessment: Generalized weakness    Lower Extremity Assessment Lower Extremity Assessment: Generalized weakness       Communication   Communication: No difficulties  Cognition Arousal/Alertness: Awake/alert Behavior During Therapy: WFL for tasks assessed/performed Overall Cognitive Status: Within Functional Limits for tasks assessed                      General Comments      Exercises     Assessment/Plan  PT Assessment Patient needs continued PT services  PT Problem List Decreased strength;Decreased activity tolerance;Decreased balance;Decreased mobility       PT Treatment Interventions DME instruction;Gait training;Stair training;Functional mobility training;Therapeutic activities;Therapeutic exercise;Balance training;Patient/family education    PT Goals (Current goals can be found in the Care Plan section)  Acute Rehab PT  Goals Patient Stated Goal: get her strength back, get back to her home PT Goal Formulation: With patient Time For Goal Achievement: 12/27/16 Potential to Achieve Goals: Good    Frequency Min 3X/week   Barriers to discharge        Co-evaluation               End of Session Equipment Utilized During Treatment: Gait belt Activity Tolerance: Patient limited by fatigue Patient left: in chair;with call bell/phone within reach Nurse Communication: Mobility status PT Visit Diagnosis: Muscle weakness (generalized) (M62.81);Other abnormalities of gait and mobility (R26.89)         Time: 5027-7412 PT Time Calculation (min) (ACUTE ONLY): 24 min   Charges:   PT Evaluation $PT Eval Moderate Complexity: 1 Procedure PT Treatments $Gait Training: 8-22 mins   PT G Codes:         Cassell Clement, PT, CSCS Pager [phone] Office [phone]  12/13/2016, 10:33 AM

## 2016-12-13 NOTE — Progress Notes (Addendum)
Tele called around 1900 stating pt had reverted back to a-fib. Oncoming RN and day shift RN assessed and tele appeared SR . Reassessment done and pt is now running in 110s and appears to be back in a-fib. MD paged to notify of change.   MD responded and we will just continue to monitor for now.

## 2016-12-13 NOTE — Plan of Care (Signed)
Problem: Health Behavior/Discharge Planning: Goal: Ability to manage health-related needs will improve Outcome: Progressing Understands her fluid overload issues and need for IV diuretics  Problem: Pain Managment: Goal: General experience of comfort will improve Outcome: Progressing No complaints of pain at this time  Problem: Activity: Goal: Risk for activity intolerance will decrease Outcome: Progressing Does well when up with walker in room

## 2016-12-14 DIAGNOSIS — I350 Nonrheumatic aortic (valve) stenosis: Secondary | ICD-10-CM

## 2016-12-14 LAB — GLUCOSE, CAPILLARY
Glucose-Capillary: 94 mg/dL (ref 65–99)
Glucose-Capillary: 113 mg/dL — ABNORMAL HIGH (ref 65–99)
Glucose-Capillary: 108 mg/dL — ABNORMAL HIGH (ref 65–99)
Glucose-Capillary: 75 mg/dL (ref 65–99)

## 2016-12-14 NOTE — Progress Notes (Addendum)
Progress Note  Patient Name: Jenna [name] Date of Encounter: 12/14/2016  Primary Cardiologist: Rozelle Caudle  Subjective   Weight down 4 lbs today - remains massively volume overloaded. Back in a-fib overnight with RVR. Amiodarone was decreased to 200 mg daily.   Inpatient Medications    Scheduled Meds: . ALPRAZolam  0.5 mg Oral BID  . amiodarone  400 mg Oral BID  . apixaban  5 mg Oral BID  . atorvastatin  40 mg Oral q1800  . buPROPion  150 mg Oral Daily  . furosemide  80 mg Intravenous BID  . insulin aspart  0-5 Units Subcutaneous QHS  . insulin aspart  0-9 Units Subcutaneous TID WC  . levothyroxine  88 mcg Oral QAC breakfast  . pantoprazole (PROTONIX) IV  40 mg Intravenous Q24H  . potassium chloride  40 mEq Oral TID  . rOPINIRole  1 mg Oral QHS   And  . rOPINIRole  0.5 mg Oral Q1200  . sodium chloride flush  3 mL Intravenous Q12H  . traMADol  50 mg Oral QHS   Continuous Infusions: . sodium chloride     PRN Meds: acetaminophen, sodium chloride flush   Vital Signs    Vitals:   12/13/16 1300 12/13/16 2127 12/14/16 0300 12/14/16 0612  BP: 105/61 106/70  109/67  Pulse: 74 (!) 102  (!) 116  Resp: 17 18  20   Temp: 97.5 F (36.4 C) 97.7 F (36.5 C)  98.2 F (36.8 C)  TempSrc: Oral Oral  Oral  SpO2:  100%  96%  Weight: 226 lb 10.1 oz (102.8 kg)  222 lb 1.6 oz (100.7 kg)   Height:        Intake/Output Summary (Last 24 hours) at 12/14/16 1000 Last data filed at 12/14/16 0530  Gross per 24 hour  Intake              960 ml  Output             2950 ml  Net            -1990 ml   Filed Weights   12/13/16 0600 12/13/16 1300 12/14/16 0300  Weight: 235 lb 8 oz (106.8 kg) 226 lb 10.1 oz (102.8 kg) 222 lb 1.6 oz (100.7 kg)    Telemetry    A-fib with RVR at 110-120   Physical Exam  Sitting up at the bedside, comfortable GEN: No acute distress.   Neck: JVP elevated Cardiac: irregularly irregular, no M/R/G's.  Respiratory: Clear to auscultation bilaterally. GI: Soft,  nontender, non-distended  MS: 3+ bilateral edema; No deformity. Neuro:  Nonfocal  Psych: Normal affect   Labs    Chemistry  Recent Labs Lab 12/11/16 0752 12/12/16 0222 12/13/16 0158  NA 136 135 136  K 3.4* 2.9* 3.7  CL 96* 93* 95*  CO2 30 30 30   GLUCOSE 89 129* 143*  BUN 38* 34* 32*  CREATININE 1.26* 1.41* 1.48*  CALCIUM 8.5* 8.4* 8.4*  GFRNONAA 41* 36* 34*  GFRAA 47* 41* 39*  ANIONGAP 10 12 11      Hematology  Recent Labs Lab 12/07/16 2135 12/11/16 0540  WBC 10.9* 5.2  RBC 4.59 4.15  HGB 12.0 10.7*  HCT 37.1 33.4*  MCV 80.8 80.5  MCH 26.1 25.8*  MCHC 32.3 32.0  RDW 14.7 15.2  PLT 458* 388    Cardiac Enzymes  Recent Labs Lab 12/07/16 2135 12/08/16 0234 12/08/16 1430  TROPONINI <0.03 <0.03 <0.03    BNP  Recent Labs  Lab 12/07/16 2200 12/08/16 0234 12/13/16 0158  BNP 1,423.9* 1,372.6* 802.0*     Radiology    No results found.   Patient Profile     76 y.o. female with cardiomyopathy, chronic systolic CHF, mild AS, GERD, HTN, persistent atrial fibrillation, OA, OSA on CPAP admitted 03/04 with CHF, recurrent atrial fib.   Assessment & Plan    1. Acute on chronic systolic CHF: Remains volume overloaded on exam. EF 20-25% - Diuresed 2L overnight - now 5L negative.  - Weight down an additional 4 lbs. - Continue IV diuresis - Start low dose carvedilol 3.125 mg BID for additional rate control and CHF benefit  2. Persistent atrial fibrillation: back in a-fib overnight.  - Moderate biatrial enlargment - increase amiodarone back to 400 mg BID. - will ask EP to consult. Given normal atrial size and systolic CHF, ?candidate for eventual ablation? Especially given recent data from the CASTLE-HF trial for mortality and decreased hospitalization benefit.  3. Cystitis: Reviewed urine culture data and there is no growth. No clinical signs of infection. Off Rocephin.  4. Generalized weakness: feels weak in legs, lives alone, concerned about stability.  PT rec SNF - will need to wait until further diuresed.  [name], MD, [facility] Attending Cardiologist [facility]  12/14/2016 10:00 AM

## 2016-12-15 DIAGNOSIS — I50810 Right heart failure, unspecified: Secondary | ICD-10-CM

## 2016-12-15 DIAGNOSIS — I959 Hypotension, unspecified: Secondary | ICD-10-CM

## 2016-12-15 LAB — BASIC METABOLIC PANEL
Anion gap: 8 (ref 5–15)
BUN: 31 mg/dL — ABNORMAL HIGH (ref 6–20)
CO2: 38 mmol/L — ABNORMAL HIGH (ref 22–32)
Calcium: 9.5 mg/dL (ref 8.9–10.3)
Chloride: 91 mmol/L — ABNORMAL LOW (ref 101–111)
Creatinine, Ser: 1.39 mg/dL — ABNORMAL HIGH (ref 0.44–1.00)
GFR calc Af Amer: 42 mL/min — ABNORMAL LOW (ref >60)
GFR calc non Af Amer: 36 mL/min — ABNORMAL LOW (ref >60)
Glucose, Bld: 125 mg/dL — ABNORMAL HIGH (ref 65–99)
Potassium: 4.6 mmol/L (ref 3.5–5.1)
Sodium: 137 mmol/L (ref 135–145)

## 2016-12-15 LAB — GLUCOSE, CAPILLARY
Glucose-Capillary: 107 mg/dL — ABNORMAL HIGH (ref 65–99)
Glucose-Capillary: 119 mg/dL — ABNORMAL HIGH (ref 65–99)
Glucose-Capillary: 116 mg/dL — ABNORMAL HIGH (ref 65–99)
Glucose-Capillary: 79 mg/dL (ref 65–99)

## 2016-12-15 MED ORDER — RANOLAZINE ER 500 MG PO TB12
500.0000 mg | ORAL_TABLET | Freq: Two times a day (BID) | ORAL | Status: DC
  Administered 2016-12-15 – 2016-12-18 (×7): 500 mg via ORAL
  Filled 2016-12-15 (×8): qty 1

## 2016-12-15 MED ORDER — AMIODARONE HCL 200 MG PO TABS
600.0000 mg | ORAL_TABLET | Freq: Two times a day (BID) | ORAL | Status: DC
  Administered 2016-12-15 – 2016-12-18 (×6): 600 mg via ORAL
  Filled 2016-12-15 (×6): qty 3

## 2016-12-15 NOTE — Consult Note (Addendum)
ELECTROPHYSIOLOGY CONSULT NOTE  Patient ID: Jenna [name], MRN: [id number], DOB/AGE: Jan 29, 1941 76 y.o. Admit date: 12/07/2016 Date of Consult: 12/15/2016  Primary Physician: Chesley Noon, MD Primary Cardiologist: [facility]  Chief Complaint: atrial fib   HPI Jenna [name] is a 76 y.o. female  Seen because of difficult to control atrial fibrillation with rapid rates in the context of sinus bradycardia and hypotension  She is a morbidly obese woman with hypertension and mild aortic stenosis and diabetes (CHADS-VASc score 5 (age 31, gender-1, hypertension-1, diabetes-1, vascular disease-1)).  She has a history of atrial fibrillation that is tachypalpitations that goes back about 5 years. These have been increasingly frequent of late. It is her impression that they are closely correlated with worsening exercise tolerance as well as accompanied by nausea. Following cardioversion in February she was able to telemetry significant difference in the 5 days before she reverted to atrial fibrillation. Efforts to maintain sinus rhythm that included amiodarone which has been incompletely effective. She has been hospitalized 2/18 echocardiogram demonstrated EF of 35-40% with mild-moderate MR;  there was severe right ventricular dysfunction and biatrial enlargement. TEE demonstrated only mild-moderate MR.    She has treated sleep apnea.  She has marked impairment of exercise tolerance as noted above. Such that she is unable to walk across the house without stopping.  Past Medical History:  Diagnosis Date  . Anxiety   . Chronic lower back pain   . Depression   . Fatigue   . GERD (gastroesophageal reflux disease)   . Heart murmur   . High cholesterol   . Hypertension   . Hypothyroidism   . Melanoma of shoulder, left ([place])   . Mild aortic stenosis   . OSA on CPAP    "I wear pillows" (11/15/2016)  . Osteoarthritis   . Palpitations   . Persistent atrial fibrillation with RVR ([place])    [name] 11/15/2016    . RLS (restless legs syndrome)   . Type II diabetes mellitus ([place])       Surgical History:  Past Surgical History:  Procedure Laterality Date  . CARDIOVERSION N/A 11/19/2016   Procedure: CARDIOVERSION;  Surgeon: Lelon Perla, MD;  Location: [facility] ENDOSCOPY;  Service: Cardiovascular;  Laterality: N/A;  . CARDIOVERSION N/A 12/11/2016   Procedure: CARDIOVERSION;  Surgeon: Dorothy Spark, MD;  Location: [facility];  Service: Cardiovascular;  Laterality: N/A;  . CATARACT EXTRACTION W/ INTRAOCULAR LENS  IMPLANT, BILATERAL Bilateral   . DILATION AND CURETTAGE OF UTERUS    . LEXISCAN MYOVIEW  05/13/2012   No ECG changes. EKG negative for ischemia. No significant ischemia demonstrated.  [name] MELANOMA EXCISION Left ~ 09/1979   "back of my shoulder"  . TEE WITHOUT CARDIOVERSION N/A 11/19/2016   Procedure: TRANSESOPHAGEAL ECHOCARDIOGRAM (TEE);  Surgeon: Lelon Perla, MD;  Location: [facility] ENDOSCOPY;  Service: Cardiovascular;  Laterality: N/A;  need CV, but no anesthesia available  . TRANSTHORACIC ECHOCARDIOGRAM  02/18/2012   EF >55%, mild aortic stenosis  . VAGINAL HYSTERECTOMY  1985     Home Meds: Prior to Admission medications   Medication Sig Start Date End Date Taking? Authorizing Provider  acarbose (PRECOSE) 25 MG tablet Take 25 mg by mouth 3 (three) times daily.     Yes Historical Provider, MD  acetaminophen (TYLENOL) 500 MG tablet Take 500-1,000 mg by mouth every 6 (six) hours as needed for mild pain, moderate pain or headache.   Yes Historical Provider, MD  ALPRAZolam Duanne Moron) 0.5 MG tablet Take 1  tablet by mouth 2 (two) times daily. 07/05/16  Yes Historical Provider, MD  amiodarone (PACERONE) 200 MG tablet Take 1-2 tablets (200-400 mg total) by mouth as directed. Patient taking differently: Take 400 mg by mouth 2 (two) times daily.  11/27/16  Yes Almyra Deforest, PA  apixaban (ELIQUIS) 5 MG TABS tablet Take 1 tablet (5 mg total) by mouth 2 (two) times daily. 11/20/16  Yes Daune Perch, NP   atorvastatin (LIPITOR) 40 MG tablet TAKE 1 TABLET (40 MG TOTAL) BY MOUTH DAILY. 10/18/16  Yes [name], MD  buPROPion (WELLBUTRIN XL) 150 MG 24 hr tablet TAKE ONE TABLET (150 MG TOTAL) BY MOUTH TWICE DAILY 08/07/15  Yes Historical Provider, MD  Calcium Carbonate-Vitamin D (CALCIUM + D PO) Take 600 mg by mouth 2 (two) times daily.    Yes Historical Provider, MD  CHERRY PO Take by mouth. Drink 1/2 cup before breakfast and 1/2 cup before supper   Yes Historical Provider, MD  Ginger, Zingiber officinalis, (GINGER PO) Take 1 capsule by mouth 2 (two) times daily.    Yes Historical Provider, MD  levothyroxine (SYNTHROID, LEVOTHROID) 88 MCG tablet Take 88 mcg by mouth daily before breakfast.   Yes Historical Provider, MD  Magnesium 250 MG TABS Take 2 tablets by mouth daily.    Yes Historical Provider, MD  meclizine (ANTIVERT) 25 MG tablet Take 1 tablet (25 mg total) by mouth 3 (three) times daily as needed for dizziness. 03/26/16  Yes Pattricia Boss, MD  metoprolol tartrate (LOPRESSOR) 25 MG tablet Take 1 tablet (25 mg total) by mouth 2 (two) times daily. 12/04/16  Yes Almyra Deforest, [place]. Devices MISC by Does not apply route at bedtime. CPAP   Yes Historical Provider, MD  Multiple Vitamins-Minerals (PRESERVISION AREDS 2) CAPS Take 1 capsule by mouth 2 (two) times daily.   Yes Historical Provider, MD  Omega-3 Fatty Acids (FISH OIL) 1200 MG CAPS Take 1 capsule by mouth 2 (two) times daily. Take 1 at breakfast and 1 at supper   Yes Historical Provider, MD  omeprazole (PRILOSEC) 40 MG capsule TAKE ONE CAPSULE BY MOUTH EVERY DAY 07/31/15  Yes Historical Provider, MD  potassium chloride SA (KLOR-CON M20) 20 MEQ tablet Take 1 tablet by mouth 2 (two) times daily. 06/20/16  Yes Historical Provider, MD  rOPINIRole (REQUIP) 0.5 MG tablet Take 2-3 daily at bedtime as needed Patient taking differently: Take 0.5-1 mg by mouth 2 (two) times daily. 0.5 mg by mouth midday then 0.5-1 mg at bedtime 09/20/14  Yes Kathee Delton, MD  torsemide (DEMADEX) 5 MG tablet Take 1 tablet (5 mg total) by mouth daily. 12/04/16  Yes Almyra Deforest, PA  traMADol (ULTRAM) 50 MG tablet Take 50 mg by mouth at bedtime.    Yes Historical Provider, MD    Inpatient Medications:  . ALPRAZolam  0.5 mg Oral BID  . amiodarone  400 mg Oral BID  . apixaban  5 mg Oral BID  . atorvastatin  40 mg Oral q1800  . buPROPion  150 mg Oral Daily  . furosemide  80 mg Intravenous BID  . insulin aspart  0-5 Units Subcutaneous QHS  . insulin aspart  0-9 Units Subcutaneous TID WC  . levothyroxine  88 mcg Oral QAC breakfast  . pantoprazole (PROTONIX) IV  40 mg Intravenous Q24H  . potassium chloride  40 mEq Oral TID  . rOPINIRole  1 mg Oral QHS   And  . rOPINIRole  0.5 mg Oral Q1200  .  sodium chloride flush  3 mL Intravenous Q12H  . traMADol  50 mg Oral QHS      Allergies:  Allergies  Allergen Reactions  . Amoxicillin Nausea Only  . Crestor [Rosuvastatin] Other (See Comments)    Joints ached  . Lovastatin Other (See Comments)    Joints hurt all over body     Social History   Social History  . Marital status: Widowed    Spouse name: N/A  . Number of children: N/A  . Years of education: N/A   Occupational History  . Not on file.   Social History Main Topics  . Smoking status: Never Smoker  . Smokeless tobacco: Never Used  . Alcohol use No  . Drug use: No  . Sexual activity: No   Other Topics Concern  . Not on file   Social History Narrative  . No narrative on file     Family History  Problem Relation Age of Onset  . Hypertension Mother   . Diabetes Mother   . Heart failure Father   . Arrhythmia Sister   . Hyperlipidemia Sister   . Hypertension Sister   . Healthy Brother   . Stroke Paternal Grandmother   . Cancer Paternal Grandfather   . Healthy Daughter   . Hypertension Son   . Hyperlipidemia Son      ROS:  Please see the history of present illness.     All other systems reviewed and negative.    Physical  Exam: Blood pressure 116/73, pulse (!) 127, temperature 98.4 F (36.9 C), temperature source Oral, resp. rate 20, height 5' 5.5" (1.664 m), weight 211 lb 12.8 oz (96.1 kg), SpO2 97 %. General: Well developed, Morbidly obese  female in no acute distress. Head: Normocephalic, atraumatic, sclera non-icteric, no xanthomas, nares are without discharge. EENT: normal Lymph Nodes:  none Back: without scoliosis/kyphosis, no CVA tendersness Neck: Negative for carotid bruits. JVD 10  Lungs: Clear bilaterally to auscultation without wheezes, rales, or rhonchi. Breathing is unlabored. Heart: Rapid irregularly irregular rate and rhythm with a normal S1 and S2  2**/6 systolic  murmur , rubs, or gallops appreciated. Abdomen: Soft, non-tender, non-distended with normoactive bowel sounds. No hepatomegaly. No rebound/guarding. No obvious abdominal masses. Msk:  Strength and tone appear normal for age. Extremities: No clubbing or cyanosis. 3+  edema.  Distal pedal pulses are 2+ and equal bilaterally. Skin: Warm and Dry Neuro: Alert and oriented X 3. CN III-XII intact Grossly normal sensory and motor function . Psych:  Responds to questions appropriately with a normal affect.      Labs: Cardiac Enzymes No results for input(s): CKTOTAL, CKMB, TROPONINI in the last 72 hours. CBC Lab Results  Component Value Date   WBC 5.2 12/11/2016   HGB 10.7 (L) 12/11/2016   HCT 33.4 (L) 12/11/2016   MCV 80.5 12/11/2016   PLT 388 12/11/2016   PROTIME: No results for input(s): LABPROT, INR in the last 72 hours. Chemistry  Recent Labs Lab 12/15/16 0345  NA 137  K 4.6  CL 91*  CO2 38*  BUN 31*  CREATININE 1.39*  CALCIUM 9.5  GLUCOSE 125*   Lipids No results found for: CHOL, HDL, LDLCALC, TRIG BNP No results found for: PROBNP Thyroid Function Tests: No results for input(s): TSH, T4TOTAL, T3FREE, THYROIDAB in the last 72 hours.  Invalid input(s): FREET3    Miscellaneous No results found for:  DDIMER  Radiology/Studies:  Dg Chest Port 1 View  Result Date: 12/07/2016 CLINICAL DATA:  Shortness of breath and weakness EXAM: PORTABLE CHEST 1 VIEW COMPARISON:  October 07, 2004 FINDINGS: The study is limited due to the low volume portable technique. Probable atelectasis in the left base. Stable cardiomegaly. No other interval changes or acute abnormalities. IMPRESSION: Limited study to technique. Mild left basilar atelectasis. No other acute abnormalities. Electronically Signed   By: Dorise Bullion III M.D   On: 12/07/2016 22:21    EKG:  3/7 sinus at 45 3/3  Atrial fib at105    Assessment and Plan:  ATRIAL Fibrillation- persistent  Sinus bradycardia  Hypotension-relative  Right ventricular dysfunction?  Morbid obesity  Treated sleep apnea  Cardiomyopathy EF 35-40%  Alkalemia nrew    The patient has persistent atrial fibrillation despite amiodarone. The short-term, we will add ranolazine to augment the antiarrhythmic effects of the amiodarone and anticipate repeat cardioversion in 48 hours.  The interim, we will continue to be aggressive with diuresis notwithstanding her relatively low blood pressure. She is massively volume overloaded. Hopefully improvements in filling pressures we'll decrease the risk of recurrent atrial fibrillation. We will have to be careful about the low blood pressure consequences in the interim.  Will restrict PO intake   I am not particularly sanguine; however, hopefully we can get her heart rate slowed down. I agree with Dr. [facility] that catheter ablation-substrate-based on [place] HF is a reasonable next step.  We may end up wiring backup bradycardia pacing even prior to thagt.  That being the case, would in anticipate CRT P implantation as, if atrial fibrillation/PVI ablation were to fail, AV ablation would be appropriate next step.  With her LV dysfunction CRT will be appropriate pacing mode  Will increase cl repletion with increasing alkalemia   I  have asked [facility]. [facility] to review the transthoracic echo which reports no RV dysfunction while the transesophageal echo reports severe RV dysfunction. In the event that the former is the case, further evaluation would be appropriate. Potential considerations would be recurrent pulmonary emboli, secondary pulmonary hypertension from sleep apnea which is treated, etc.  Have reviewed the above with the patient.        Virl Axe

## 2016-12-15 NOTE — Progress Notes (Signed)
Progress Note  Patient Name: Jenna [name] Date of Encounter: 12/15/2016  Primary Cardiologist: Evoleth Nordmeyer  Subjective   Weight down 11 lbs ? - recorded another 3.1L negative. Appreciate Dr. Olin Pia recommendations - amiodarone increased and ranexa added for additive benefit. Plan for repeat cardioversion sometime this week when she is euvolemic. Ultimately may need ablation +/- CRT-P with history of bradycardia in the past.  Inpatient Medications    Scheduled Meds: . ALPRAZolam  0.5 mg Oral BID  . amiodarone  600 mg Oral BID  . apixaban  5 mg Oral BID  . atorvastatin  40 mg Oral q1800  . buPROPion  150 mg Oral Daily  . furosemide  80 mg Intravenous BID  . insulin aspart  0-5 Units Subcutaneous QHS  . insulin aspart  0-9 Units Subcutaneous TID WC  . levothyroxine  88 mcg Oral QAC breakfast  . pantoprazole (PROTONIX) IV  40 mg Intravenous Q24H  . potassium chloride  40 mEq Oral TID  . ranolazine  500 mg Oral BID  . rOPINIRole  1 mg Oral QHS   And  . rOPINIRole  0.5 mg Oral Q1200  . sodium chloride flush  3 mL Intravenous Q12H  . traMADol  50 mg Oral QHS   Continuous Infusions: . sodium chloride     PRN Meds: acetaminophen, sodium chloride flush   Vital Signs    Vitals:   12/14/16 0612 12/14/16 1318 12/14/16 2100 12/15/16 0351  BP: 109/67 (!) 95/58 99/77 116/73  Pulse: (!) 116 100 (!) 123 (!) 127  Resp: 20 18  20   Temp: 98.2 F (36.8 C) 97.8 F (36.6 C) 98.1 F (36.7 C) 98.4 F (36.9 C)  TempSrc: Oral Oral Oral Oral  SpO2: 96% 95% 98% 97%  Weight:    211 lb 12.8 oz (96.1 kg)  Height:        Intake/Output Summary (Last 24 hours) at 12/15/16 1145 Last data filed at 12/15/16 0300  Gross per 24 hour  Intake              570 ml  Output             2100 ml  Net            -1530 ml   Filed Weights   12/13/16 1300 12/14/16 0300 12/15/16 0351  Weight: 226 lb 10.1 oz (102.8 kg) 222 lb 1.6 oz (100.7 kg) 211 lb 12.8 oz (96.1 kg)    Telemetry    A-fib with RVR in  120's   Physical Exam  Sitting up at the bedside, comfortable GEN: No acute distress.   Neck: JVP elevated Cardiac: irregularly irregular, no M/R/G's.  Respiratory: Clear to auscultation bilaterally. GI: Soft, nontender, non-distended  MS: 3+ bilateral edema; No deformity. Neuro:  Nonfocal  Psych: Normal affect   Labs    Chemistry  Recent Labs Lab 12/12/16 0222 12/13/16 0158 12/15/16 0345  NA 135 136 137  K 2.9* 3.7 4.6  CL 93* 95* 91*  CO2 30 30 38*  GLUCOSE 129* 143* 125*  BUN 34* 32* 31*  CREATININE 1.41* 1.48* 1.39*  CALCIUM 8.4* 8.4* 9.5  GFRNONAA 36* 34* 36*  GFRAA 41* 39* 42*  ANIONGAP 12 11 8      Hematology  Recent Labs Lab 12/11/16 0540  WBC 5.2  RBC 4.15  HGB 10.7*  HCT 33.4*  MCV 80.5  MCH 25.8*  MCHC 32.0  RDW 15.2  PLT 388    Cardiac Enzymes  Recent  Labs Lab 12/08/16 1430  TROPONINI <0.03    BNP  Recent Labs Lab 12/13/16 0158  BNP 802.0*     Radiology    No results found.   Patient Profile     76 y.o. female with cardiomyopathy, chronic systolic CHF, mild AS, GERD, HTN, persistent atrial fibrillation, OA, OSA on CPAP admitted 03/04 with CHF, recurrent atrial fib.   Assessment & Plan    1. Acute on chronic systolic CHF: Remains volume overloaded on exam. EF 20-25% - Diuresed 3L overnight - now 8L negative.  - Weight down an additional 11 lbs. - Continue IV diuresis - On carvedilol 3.125 mg BID for additional rate control and CHF benefit - Question of RV dysfunction with conflicting reports - will re-evaluate echo  2. Persistent atrial fibrillation: back in a-fib overnight.  - Moderate biatrial enlargment - Amiodarone increased to 600 mg BID by Dr. Caryl Comes - ranolazine 500 mg BID added - EP suggests repeat cardioversion attempt this week when she is euvolemic - Ultimately, would benefit from a-fib ablation +/- CRT-P  3. Cystitis: Reviewed urine culture data and there is no growth. No clinical signs of infection. Off  Rocephin.  4. Generalized weakness: feels weak in legs, lives alone, concerned about stability. PT rec SNF - will need to wait until further diuresed.  Anticipate SNF discharge mid-to late this week.  [name], MD, [facility] Attending Cardiologist [facility]  12/15/2016 11:45 AM

## 2016-12-16 LAB — AMIODARONE LEVEL
Amiodarone Lvl: 0.9 ug/mL — ABNORMAL LOW (ref 1.0–2.5)
N-Desethyl-Amiodarone: 0.3 ug/mL — ABNORMAL LOW (ref 1.0–2.5)

## 2016-12-16 LAB — GLUCOSE, CAPILLARY
Glucose-Capillary: 95 mg/dL (ref 65–99)
Glucose-Capillary: 116 mg/dL — ABNORMAL HIGH (ref 65–99)
Glucose-Capillary: 109 mg/dL — ABNORMAL HIGH (ref 65–99)
Glucose-Capillary: 137 mg/dL — ABNORMAL HIGH (ref 65–99)

## 2016-12-16 MED ORDER — CARVEDILOL 3.125 MG PO TABS: 3.1250 mg | ORAL_TABLET | Freq: Two times a day (BID) | ORAL | Status: DC

## 2016-12-16 MED ORDER — CARVEDILOL 3.125 MG PO TABS
3.1250 mg | ORAL_TABLET | Freq: Two times a day (BID) | ORAL | Status: DC
  Administered 2016-12-16 – 2016-12-18 (×5): 3.125 mg via ORAL
  Filled 2016-12-16 (×5): qty 1

## 2016-12-16 MED ORDER — CARVEDILOL 3.125 MG PO TABS
3.1250 mg | ORAL_TABLET | Freq: Two times a day (BID) | ORAL | Status: DC
Start: 2016-12-16 — End: 2016-12-16

## 2016-12-16 NOTE — Progress Notes (Signed)
Spoke with endoscopy, Jenna [name] says that block is still closed for scheduling on Wednesday but for the team to call back tomorrow to schedule DCCV for Wednesday. Will need orders at that time.  Jenna Kief PA-C

## 2016-12-16 NOTE — Progress Notes (Signed)
Progress Note  Patient Name: Jenna [name] Date of Encounter: 12/16/2016  Primary Cardiologist: Renai Lopata  Subjective   Weight down another 4 lbs overnight. Negative an additional 2.5L. Creatinine pending.  Inpatient Medications    Scheduled Meds: . ALPRAZolam  0.5 mg Oral BID  . amiodarone  600 mg Oral BID  . apixaban  5 mg Oral BID  . atorvastatin  40 mg Oral q1800  . buPROPion  150 mg Oral Daily  . furosemide  80 mg Intravenous BID  . insulin aspart  0-5 Units Subcutaneous QHS  . insulin aspart  0-9 Units Subcutaneous TID WC  . levothyroxine  88 mcg Oral QAC breakfast  . pantoprazole (PROTONIX) IV  40 mg Intravenous Q24H  . potassium chloride  40 mEq Oral TID  . ranolazine  500 mg Oral BID  . rOPINIRole  1 mg Oral QHS   And  . rOPINIRole  0.5 mg Oral Q1200  . sodium chloride flush  3 mL Intravenous Q12H  . traMADol  50 mg Oral QHS   Continuous Infusions: . sodium chloride     PRN Meds: acetaminophen, sodium chloride flush   Vital Signs    Vitals:   12/15/16 1300 12/15/16 2100 12/16/16 0551 12/16/16 0554  BP: 102/60 107/79 112/84   Pulse: (!) 141 (!) 141 (!) 120   Resp: 20 18 20    Temp: 97.8 F (36.6 C) 98.1 F (36.7 C) 98.4 F (36.9 C)   TempSrc: Oral Oral Oral   SpO2: 97% 100% 97%   Weight:    207 lb 4.8 oz (94 kg)  Height:        Intake/Output Summary (Last 24 hours) at 12/16/16 1148 Last data filed at 12/16/16 0500  Gross per 24 hour  Intake              680 ml  Output             3250 ml  Net            -2570 ml   Filed Weights   12/14/16 0300 12/15/16 0351 12/16/16 0554  Weight: 222 lb 1.6 oz (100.7 kg) 211 lb 12.8 oz (96.1 kg) 207 lb 4.8 oz (94 kg)    Telemetry    A-fib with RVR in 120's   Physical Exam  Sitting up at the bedside, comfortable GEN: No acute distress.   Neck: JVP elevated Cardiac: irregularly irregular, no M/R/G's.  Respiratory: Clear to auscultation bilaterally. GI: Soft, nontender, non-distended  MS: 3+ bilateral  edema; No deformity. Neuro:  Nonfocal  Psych: Normal affect   Labs    Chemistry  Recent Labs Lab 12/12/16 0222 12/13/16 0158 12/15/16 0345  NA 135 136 137  K 2.9* 3.7 4.6  CL 93* 95* 91*  CO2 30 30 38*  GLUCOSE 129* 143* 125*  BUN 34* 32* 31*  CREATININE 1.41* 1.48* 1.39*  CALCIUM 8.4* 8.4* 9.5  GFRNONAA 36* 34* 36*  GFRAA 41* 39* 42*  ANIONGAP 12 11 8      Hematology  Recent Labs Lab 12/11/16 0540  WBC 5.2  RBC 4.15  HGB 10.7*  HCT 33.4*  MCV 80.5  MCH 25.8*  MCHC 32.0  RDW 15.2  PLT 388    Cardiac Enzymes No results for input(s): TROPONINI in the last 168 hours.  BNP  Recent Labs Lab 12/13/16 0158  BNP 802.0*     Radiology    No results found.   Patient Profile     76  y.o. female with cardiomyopathy, chronic systolic CHF, mild AS, GERD, HTN, persistent atrial fibrillation, OA, OSA on CPAP admitted 03/04 with CHF, recurrent atrial fib.   Assessment & Plan    1. Acute on chronic systolic CHF: Remains volume overloaded on exam. EF 20-25% - Diuresed more overnight - recheck BMET in am - Continue IV diuresis - Add carvedilol 3.125 mg BID for additional rate control and CHF benefit - Question of RV dysfunction with conflicting reports - will re-evaluate echo  2. Persistent atrial fibrillation: back in a-fib overnight.  - Moderate biatrial enlargment - Amiodarone increased to 600 mg BID by Dr. Caryl Comes - ranolazine 500 mg BID added - EP suggests repeat cardioversion attempt this week when she is euvolemic - Ultimately, would benefit from a-fib ablation +/- CRT-P  3. Cystitis: Reviewed urine culture data and there is no growth. No clinical signs of infection. Off Rocephin.  4. Generalized weakness: feels weak in legs, lives alone, concerned about stability. PT rec SNF - will need to wait until further diuresed.  Anticipate SNF discharge mid-to late this week.  [name], MD, [facility] Attending Cardiologist [facility]  12/16/2016 11:48  AM

## 2016-12-16 NOTE — Progress Notes (Signed)
Physical Therapy Treatment Patient Details Name: Jenna [name] MRN: [id number] DOB: July 31, 1941 Today's Date: 12/16/2016    History of Present Illness 76 y.o.femaleadmitted with CHF, recurrent atrial fib. PMH: cardiomyopathy, chronic systolic CHF, mild AS, HTN, persistent atrial fibrillation, diabetes, restless leg syndrome, RVR, chronic low back pain.    PT Comments    Pt pleasant able to progress gait but HR remained in Afib with HR 115-136 with gait with return to 120 at rest. Pt with cues for posture and reliance on RW with decreased activity tolerance and unable to complete further activity due to fatigue. Pt educated for HEP and will continue to follow.     Follow Up Recommendations  SNF     Equipment Recommendations  None recommended by PT    Recommendations for Other Services       Precautions / Restrictions Precautions Precautions: Fall Precaution Comments: watch HR Restrictions Weight Bearing Restrictions: No    Mobility  Bed Mobility               General bed mobility comments: sitting in chair upon arrival  Transfers     Transfers: Sit to/from Stand Sit to Stand: Supervision         General transfer comment: supervision for safety from chair and toilet  Ambulation/Gait Ambulation/Gait assistance: Min guard Ambulation Distance (Feet): 300 Feet Assistive device: Rolling walker (2 wheeled) Gait Pattern/deviations: Step-through pattern;Decreased stride length;Trunk flexed   Gait velocity interpretation: Below normal speed for age/gender General Gait Details: mod cues for posture and position in RW, decreased speed, reliance on RW at this time   Stairs            Wheelchair Mobility    Modified Rankin (Stroke Patients Only)       Balance Overall balance assessment: Needs assistance   Sitting balance-Leahy Scale: Good       Standing balance-Leahy Scale: Fair                      Cognition Arousal/Alertness:  Awake/alert Behavior During Therapy: WFL for tasks assessed/performed Overall Cognitive Status: Within Functional Limits for tasks assessed                      Exercises General Exercises - Lower Extremity Long Arc Quad: AROM;Both;Seated;15 reps Hip Flexion/Marching: AROM;Both;Seated;10 reps Toe Raises: AROM;Both;Seated;15 reps    General Comments        Pertinent Vitals/Pain Pain Assessment: 0-10 Pain Score: 4  Pain Location: low back  Pain Descriptors / Indicators: Aching Pain Intervention(s): Limited activity within patient's tolerance;Repositioned    Home Living                      Prior Function            PT Goals (current goals can now be found in the care plan section) Progress towards PT goals: Progressing toward goals    Frequency           PT Plan Current plan remains appropriate    Co-evaluation             End of Session Equipment Utilized During Treatment: Gait belt Activity Tolerance: Patient limited by fatigue Patient left: in chair;with call bell/phone within reach Nurse Communication: Mobility status PT Visit Diagnosis: Difficulty in walking, not elsewhere classified (R26.2);Muscle weakness (generalized) (M62.81)     Time: 5916-3846 PT Time Calculation (min) (ACUTE ONLY): 27 min  Charges:  $Gait Training: 8-22  mins $Therapeutic Exercise: 8-22 mins                    G Codes:       Shaindy Reader B Neenah Canter 2016-12-31, 12:17 PM  Elwyn Reach, [place]

## 2016-12-16 NOTE — Care Management Important Message (Signed)
`  Important Message  Patient Details  Name: Jenna [name] MRN: [id number] Date of Birth: Feb 22, 1941   Medicare Important Message Given:  Yes    Nathen May 12/16/2016, 12:51 PM

## 2016-12-17 DIAGNOSIS — N189 Chronic kidney disease, unspecified: Secondary | ICD-10-CM

## 2016-12-17 DIAGNOSIS — I5023 Acute on chronic systolic (congestive) heart failure: Secondary | ICD-10-CM

## 2016-12-17 DIAGNOSIS — D649 Anemia, unspecified: Secondary | ICD-10-CM

## 2016-12-17 DIAGNOSIS — N179 Acute kidney failure, unspecified: Secondary | ICD-10-CM

## 2016-12-17 DIAGNOSIS — R531 Weakness: Secondary | ICD-10-CM

## 2016-12-17 DIAGNOSIS — R739 Hyperglycemia, unspecified: Secondary | ICD-10-CM

## 2016-12-17 LAB — BASIC METABOLIC PANEL
Anion gap: 10 (ref 5–15)
BUN: 30 mg/dL — ABNORMAL HIGH (ref 6–20)
CO2: 36 mmol/L — ABNORMAL HIGH (ref 22–32)
Calcium: 9.2 mg/dL (ref 8.9–10.3)
Chloride: 92 mmol/L — ABNORMAL LOW (ref 101–111)
Creatinine, Ser: 1.37 mg/dL — ABNORMAL HIGH (ref 0.44–1.00)
GFR calc Af Amer: 43 mL/min — ABNORMAL LOW (ref >60)
GFR calc non Af Amer: 37 mL/min — ABNORMAL LOW (ref >60)
Glucose, Bld: 113 mg/dL — ABNORMAL HIGH (ref 65–99)
Potassium: 4.7 mmol/L (ref 3.5–5.1)
Sodium: 138 mmol/L (ref 135–145)

## 2016-12-17 LAB — GLUCOSE, CAPILLARY
Glucose-Capillary: 104 mg/dL — ABNORMAL HIGH (ref 65–99)
Glucose-Capillary: 90 mg/dL (ref 65–99)
Glucose-Capillary: 143 mg/dL — ABNORMAL HIGH (ref 65–99)
Glucose-Capillary: 91 mg/dL (ref 65–99)

## 2016-12-17 MED ORDER — POLYETHYLENE GLYCOL 3350 17 G PO PACK
17.0000 g | PACK | Freq: Every day | ORAL | Status: DC | PRN
  Administered 2016-12-17: 17 g via ORAL
  Filled 2016-12-17: qty 1

## 2016-12-17 MED ORDER — SODIUM CHLORIDE 0.9 % IV SOLN: 250.0000 mL | INTRAVENOUS | Status: DC

## 2016-12-17 MED ORDER — ONDANSETRON HCL 4 MG/2ML IJ SOLN
4.0000 mg | Freq: Three times a day (TID) | INTRAMUSCULAR | Status: DC | PRN
  Administered 2016-12-17: 4 mg via INTRAVENOUS
  Filled 2016-12-17: qty 2

## 2016-12-17 MED ORDER — POLYETHYLENE GLYCOL 3350 17 G PO PACK: 17.0000 g | PACK | Freq: Every day | ORAL | Status: DC

## 2016-12-17 MED ORDER — SODIUM CHLORIDE 0.9% FLUSH
3.0000 mL | Freq: Two times a day (BID) | INTRAVENOUS | Status: DC
  Administered 2016-12-17 – 2016-12-18 (×2): 3 mL via INTRAVENOUS

## 2016-12-17 MED ORDER — SODIUM CHLORIDE 0.9% FLUSH
3.0000 mL | INTRAVENOUS | Status: DC | PRN
Start: 2016-12-17 — End: 2016-12-18

## 2016-12-17 NOTE — Clinical Social Work Note (Signed)
Clinical Social Work Assessment  Patient Details  Name: Jenna [name] MRN: [id number] Date of Birth: 03/06/1941  Date of referral:  12/17/16               Reason for consult:  Discharge Planning                Permission sought to share information with:  Family Supports Permission granted to share information::  Yes, Verbal Permission Granted  Name::     Ewell Poe  Agency::     Relationship::  daughter  Contact Information:  [phone]  Housing/Transportation Living arrangements for the past 2 months:  [place] of Information:  Patient Patient Interpreter Needed:  None Criminal Activity/Legal Involvement Pertinent to Current Situation/Hospitalization:  No - Comment as needed Significant Relationships:  Adult Children Lives with:  Self Do you feel safe going back to the place where you live?  Yes Need for family participation in patient care:  No (Coment)  Care giving concerns:  Patient lives at home by herself  Social Worker assessment / plan:  Clinical Social Worker met patient at bedside to offer support and discuss patient discharge needs. Patient stated she lives at home by herself. Patient stated she went to [facility] in 2016 and does not mind going back. Patient stated she prefers to go to a SNF closer to daughter. CSW to complete necessary paperwork to initiate SNF search on patient behalf. CSW remains available for support and to facilitate patient discharge needs once medically ready.  Employment status:  Retired Forensic scientist:  Medicare PT Recommendations:  [place] / Referral to community resources:  [place]  Patient/Family's Response to care: Patient verbalized appreciation and understanding for CSW role and involvement in care. Patient agreeable with current discharge plan to SNF following discharge.   Patient/Family's Understanding of and Emotional Response to Diagnosis, Current Treatment,  and Prognosis: Patient with good understanding of current medical state and limitations around most recent hospitalization. Patient agreeable with SNF placement in hopes of being more independent and going back home.  Emotional Assessment Appearance:  Appears stated age Attitude/Demeanor/Rapport:  Other Affect (typically observed):  Pleasant Orientation:  Oriented to Situation, Oriented to  Time, Oriented to Place Alcohol / Substance use:  Not Applicable Psych involvement (Current and /or in the community):  No (Comment)  Discharge Needs  Concerns to be addressed:  No discharge needs identified Readmission within the last 30 days:  No Current discharge risk:  None Barriers to Discharge:  No Barriers Identified   Wende Neighbors, LCSW 12/17/2016, 1:26 PM

## 2016-12-17 NOTE — Clinical Social Work Placement (Signed)
   CLINICAL SOCIAL WORK PLACEMENT  NOTE  Date:  12/17/2016  Patient Details  Name: Jenna [name] MRN: [id number] Date of Birth: November 24, 1940  Clinical Social Work is seeking post-discharge placement for this patient at the [place] level of care (*CSW will initial, date and re-position this form in  chart as items are completed):  Yes   Patient/family provided with [facility] Work Department's list of facilities offering this level of care within the geographic area requested by the patient (or if unable, by the patient's family).  Yes   Patient/family informed of their freedom to choose among providers that offer the needed level of care, that participate in Medicare, Medicaid or managed care program needed by the patient, have an available bed and are willing to accept the patient.  Yes   Patient/family informed of 's ownership interest in [facility] and [facility], as well as of the fact that they are under no obligation to receive care at these facilities.  PASRR submitted to EDS on       PASRR number received on       Existing PASRR number confirmed on       FL2 transmitted to all facilities in geographic area requested by pt/family on       FL2 transmitted to all facilities within larger geographic area on       Patient informed that his/her managed care company has contracts with or will negotiate with certain facilities, including the following:            Patient/family informed of bed offers received.  Patient chooses bed at       Physician recommends and patient chooses bed at      Patient to be transferred to   on  .  Patient to be transferred to facility by       Patient family notified on   of transfer.  Name of family member notified:        PHYSICIAN Please sign FL2     Additional Comment:    _______________________________________________ Wende Neighbors, LCSW 12/17/2016, 1:33 PM

## 2016-12-17 NOTE — Progress Notes (Signed)
Progress Note  Patient Name: Jenna [name] Date of Encounter: 12/17/2016  Primary Cardiologist: Dr. Debara Pickett  Subjective   Remains feeling "worn down" and with DOE when she ambulates, c/w prior afib sx. Remains in persistent atrial fib. No chest pain. Edema is slowly improving. No bleeding reported.  Inpatient Medications    Scheduled Meds: . ALPRAZolam  0.5 mg Oral BID  . amiodarone  600 mg Oral BID  . apixaban  5 mg Oral BID  . atorvastatin  40 mg Oral q1800  . buPROPion  150 mg Oral Daily  . carvedilol  3.125 mg Oral BID  . furosemide  80 mg Intravenous BID  . insulin aspart  0-5 Units Subcutaneous QHS  . insulin aspart  0-9 Units Subcutaneous TID WC  . levothyroxine  88 mcg Oral QAC breakfast  . pantoprazole (PROTONIX) IV  40 mg Intravenous Q24H  . potassium chloride  40 mEq Oral TID  . ranolazine  500 mg Oral BID  . rOPINIRole  1 mg Oral QHS   And  . rOPINIRole  0.5 mg Oral Q1200  . sodium chloride flush  3 mL Intravenous Q12H  . traMADol  50 mg Oral QHS   Continuous Infusions: . sodium chloride     PRN Meds: acetaminophen, sodium chloride flush   Vital Signs    Vitals:   12/16/16 1214 12/16/16 1400 12/16/16 2014 12/17/16 0456  BP:  105/78 95/72 114/72  Pulse: (!) 124 (!) 111 (!) 112 (!) 121  Resp:  17 17 17   Temp:  98.2 F (36.8 C) 98.4 F (36.9 C) 97.8 F (36.6 C)  TempSrc:  Oral Oral Oral  SpO2:  96% 96% 97%  Weight:    205 lb (93 kg)  Height:        Intake/Output Summary (Last 24 hours) at 12/17/16 1057 Last data filed at 12/17/16 0218  Gross per 24 hour  Intake              370 ml  Output             1300 ml  Net             -930 ml   Filed Weights   12/15/16 0351 12/16/16 0554 12/17/16 0456  Weight: 211 lb 12.8 oz (96.1 kg) 207 lb 4.8 oz (94 kg) 205 lb (93 kg)    Telemetry    Persistent atrial fib rates 110-120s, occasional peaks to 140s Personally Reviewed   Physical Exam   GEN: No acute distress.  HEENT: Normocephalic,  atraumatic, sclera non-icteric. Neck: No JVD or bruits. Cardiac: RRR no murmurs, rubs, or gallops.  Radials/DP/PT 1+ and equal bilaterally.  Respiratory: Clear to auscultation bilaterally. Breathing is unlabored. GI: Soft, nontender, non-distended, BS +x 4. MS: no deformity. Extremities: No clubbing or cyanosis. 2+ bilateral LE edema. Distal pedal pulses are 2+ and equal bilaterally. Neuro:  AAOx3. Follows commands. Psych:  Responds to questions appropriately with a normal affect.  Labs    Chemistry Recent Labs Lab 12/13/16 0158 12/15/16 0345 12/17/16 0216  NA 136 137 138  K 3.7 4.6 4.7  CL 95* 91* 92*  CO2 30 38* 36*  GLUCOSE 143* 125* 113*  BUN 32* 31* 30*  CREATININE 1.48* 1.39* 1.37*  CALCIUM 8.4* 9.5 9.2  GFRNONAA 34* 36* 37*  GFRAA 39* 42* 43*  ANIONGAP 11 8 10      Hematology Recent Labs Lab 12/11/16 0540  WBC 5.2  RBC 4.15  HGB 10.7*  HCT 33.4*  MCV 80.5  MCH 25.8*  MCHC 32.0  RDW 15.2  PLT 388     BNP Recent Labs Lab 12/13/16 0158  BNP 802.0*     DDimer No results for input(s): DDIMER in the last 168 hours.   Radiology    No results found.  Cardiac Studies   See below.  Patient Profile     76 y.o. female with paroxysmal atrial fib now persistent, recently diagnosed chronic systolic CHF 06/5319, mild AS, GERD, HTN, OA, OSA on CPAP, obesity re-admitted 3/3 with worsening HF and persistent atrial fib. In 11/2016 she was admitted with CHF (new EF 35-40% by echo and 20-25% by TEE 11/2016) and persistent atrial fib - underwent TEE/DCCV 11/19/16. Had recurrence when seen back in the clinic -> placed on amiodarone with plans for future DCCV. Returned in the interim with above sx. Upon this admission, underwent DCCV 12/11/16 with cardioversion to junctional bradycardia then sinus, but went back into atrial fib as of 12/14/16.  Assessment & Plan    1. Acute on chronic systolic CHF - s/p significant diuresis thus far, but remains volume overloaded on exam.  Dry weight said to be 101kg in recent note but she still appears edematous. Continue IV diuresis. Etiology of cardiomyopathy not definitively known but concerning for tachy-mediated given persistent arrhythmias.   2. Persistent atrial fib - Amiodarone increased to 600 mg BID by Dr. Caryl Comes - ranolazine 500 mg BID added. EP suggests repeat cardioversion attempt this week when she is euvolemic. Ultimately, would benefit from a-fib ablation +/- CRT-P. Would also need to consider whether or not she would need ICD given her LV dysfunction. Will also await MD input regarding discrepancy of information regarding RV dysfunction - severely reduced by recent TEE but normal by recent echo. Borderline blood pressure prohibits aggressive med titration otherwise. DCCV scheduled for 12:45 tomorrow with Dr. Oval Linsey, reviewed procedure risks/benefits with patient, she is familiar from last two attempts. Orders written.  3. Cystitis: Reviewed urine culture data and there is no growth. No clinical signs of infection. Off Rocephin.  4. Generalized weakness: feels weak in legs, lives alone, concerned about stability. PT rec SNF - will need to wait until further diuresed.  5. 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  6. OSA - patient reports compliance with CPAP.  7. Hyperglycemia - recent A1C 6.7 indicative of new DM. Will need close OP f/u with PCP for further management.  8. Anemia - f/u Hgb in AM. Prior values 12-13, most recently 10.7 on 3/7. No bleeding reported. Trend for now.  9. Recently abnormal LFTs 11/2016 - given recently initiated amiodarone, will recheck LFTs in AM to trend.  Signed, Charlie Pitter, PA-C  12/17/2016, 10:57 AM

## 2016-12-18 ENCOUNTER — Inpatient Hospital Stay (HOSPITAL_COMMUNITY): Payer: Medicare Other | Admitting: Anesthesiology

## 2016-12-18 ENCOUNTER — Encounter (HOSPITAL_COMMUNITY)
Admission: EM | Disposition: A | Discharge status: Skilled Nursing Facility | Payer: Self-pay | Source: Home / Self Care | Attending: Internal Medicine

## 2016-12-18 ENCOUNTER — Encounter (HOSPITAL_COMMUNITY): Payer: Self-pay | Admitting: *Deleted

## 2016-12-18 DIAGNOSIS — R7989 Other specified abnormal findings of blood chemistry: Secondary | ICD-10-CM

## 2016-12-18 HISTORY — PX: CARDIOVERSION: SHX1299

## 2016-12-18 LAB — HEPATIC FUNCTION PANEL
ALT: 129 U/L — ABNORMAL HIGH (ref 14–54)
AST: 50 U/L — ABNORMAL HIGH (ref 15–41)
Albumin: 3.1 g/dL — ABNORMAL LOW (ref 3.5–5.0)
Alkaline Phosphatase: 98 U/L (ref 38–126)
Bilirubin, Direct: 0.2 mg/dL (ref 0.1–0.5)
Indirect Bilirubin: 0.5 mg/dL (ref 0.3–0.9)
Total Bilirubin: 0.7 mg/dL (ref 0.3–1.2)
Total Protein: 5.9 g/dL — ABNORMAL LOW (ref 6.5–8.1)

## 2016-12-18 LAB — CBC
HCT: 35.6 % — ABNORMAL LOW (ref 36.0–46.0)
Hemoglobin: 11.1 g/dL — ABNORMAL LOW (ref 12.0–15.0)
MCH: 25.1 pg — ABNORMAL LOW (ref 26.0–34.0)
MCHC: 31.2 g/dL (ref 30.0–36.0)
MCV: 80.5 fL (ref 78.0–100.0)
Platelets: 335 10*3/uL (ref 150–400)
RBC: 4.42 MIL/uL (ref 3.87–5.11)
RDW: 15.3 % (ref 11.5–15.5)
WBC: 5.7 10*3/uL (ref 4.0–10.5)

## 2016-12-18 LAB — BASIC METABOLIC PANEL
Anion gap: 8 (ref 5–15)
BUN: 31 mg/dL — ABNORMAL HIGH (ref 6–20)
CO2: 34 mmol/L — ABNORMAL HIGH (ref 22–32)
Calcium: 8.7 mg/dL — ABNORMAL LOW (ref 8.9–10.3)
Chloride: 94 mmol/L — ABNORMAL LOW (ref 101–111)
Creatinine, Ser: 1.39 mg/dL — ABNORMAL HIGH (ref 0.44–1.00)
GFR calc Af Amer: 42 mL/min — ABNORMAL LOW (ref >60)
GFR calc non Af Amer: 36 mL/min — ABNORMAL LOW (ref >60)
Glucose, Bld: 140 mg/dL — ABNORMAL HIGH (ref 65–99)
Potassium: 4.3 mmol/L (ref 3.5–5.1)
Sodium: 136 mmol/L (ref 135–145)

## 2016-12-18 LAB — GLUCOSE, CAPILLARY
Glucose-Capillary: 82 mg/dL (ref 65–99)
Glucose-Capillary: 161 mg/dL — ABNORMAL HIGH (ref 65–99)
Glucose-Capillary: 128 mg/dL — ABNORMAL HIGH (ref 65–99)

## 2016-12-18 SURGERY — CARDIOVERSION
Anesthesia: Monitor Anesthesia Care

## 2016-12-18 MED ORDER — INSULIN ASPART 100 UNIT/ML ~~LOC~~ SOLN
0.0000 [IU] | Freq: Three times a day (TID) | SUBCUTANEOUS | Status: DC
  Administered 2016-12-18: 2 [IU] via SUBCUTANEOUS
  Administered 2016-12-19: 3 [IU] via SUBCUTANEOUS
  Administered 2016-12-20: 2 [IU] via SUBCUTANEOUS

## 2016-12-18 MED ORDER — CARVEDILOL 3.125 MG PO TABS
3.1250 mg | ORAL_TABLET | Freq: Two times a day (BID) | ORAL | Status: DC
  Administered 2016-12-18 – 2016-12-20 (×3): 3.125 mg via ORAL
  Filled 2016-12-18 (×3): qty 1

## 2016-12-18 MED ORDER — INSULIN ASPART 100 UNIT/ML ~~LOC~~ SOLN: 0.0000 [IU] | Freq: Every day | SUBCUTANEOUS | Status: DC

## 2016-12-18 MED ORDER — ALPRAZOLAM 0.5 MG PO TABS: 0.5000 mg | ORAL_TABLET | Freq: Two times a day (BID) | ORAL | Status: DC

## 2016-12-18 MED ORDER — PROPOFOL 10 MG/ML IV BOLUS
INTRAVENOUS | Status: DC | PRN
  Administered 2016-12-18: 80 mg via INTRAVENOUS

## 2016-12-18 MED ORDER — ATORVASTATIN CALCIUM 40 MG PO TABS: 40.0000 mg | ORAL_TABLET | Freq: Every day | ORAL | Status: DC

## 2016-12-18 MED ORDER — BUPROPION HCL ER (XL) 150 MG PO TB24
150.0000 mg | ORAL_TABLET | Freq: Every day | ORAL | Status: DC
  Administered 2016-12-19 – 2016-12-20 (×2): 150 mg via ORAL
  Filled 2016-12-18 (×2): qty 1

## 2016-12-18 MED ORDER — POTASSIUM CHLORIDE CRYS ER 20 MEQ PO TBCR
40.0000 meq | EXTENDED_RELEASE_TABLET | Freq: Three times a day (TID) | ORAL | Status: DC
  Administered 2016-12-18 (×2): 40 meq via ORAL
  Filled 2016-12-18 (×2): qty 2

## 2016-12-18 MED ORDER — SODIUM CHLORIDE 0.9 % IV SOLN: 250.0000 mL | INTRAVENOUS | Status: DC

## 2016-12-18 MED ORDER — APIXABAN 5 MG PO TABS
5.0000 mg | ORAL_TABLET | Freq: Two times a day (BID) | ORAL | Status: DC
  Administered 2016-12-18 – 2016-12-20 (×4): 5 mg via ORAL
  Filled 2016-12-18 (×4): qty 1

## 2016-12-18 MED ORDER — RANOLAZINE ER 500 MG PO TB12
500.0000 mg | ORAL_TABLET | Freq: Two times a day (BID) | ORAL | Status: DC
  Administered 2016-12-18 – 2016-12-20 (×4): 500 mg via ORAL
  Filled 2016-12-18 (×4): qty 1

## 2016-12-18 MED ORDER — SODIUM CHLORIDE 0.9% FLUSH
3.0000 mL | Freq: Two times a day (BID) | INTRAVENOUS | Status: DC
  Administered 2016-12-18 – 2016-12-20 (×4): 3 mL via INTRAVENOUS

## 2016-12-18 MED ORDER — ALPRAZOLAM 0.5 MG PO TABS
0.5000 mg | ORAL_TABLET | Freq: Two times a day (BID) | ORAL | Status: DC
  Administered 2016-12-18 – 2016-12-20 (×4): 0.5 mg via ORAL
  Filled 2016-12-18 (×4): qty 1

## 2016-12-18 MED ORDER — ONDANSETRON HCL 4 MG/2ML IJ SOLN
4.0000 mg | Freq: Three times a day (TID) | INTRAMUSCULAR | Status: AC | PRN
  Administered 2016-12-18 – 2016-12-19 (×2): 4 mg via INTRAVENOUS
  Filled 2016-12-18 (×2): qty 2

## 2016-12-18 MED ORDER — FUROSEMIDE 10 MG/ML IJ SOLN
80.0000 mg | Freq: Two times a day (BID) | INTRAMUSCULAR | Status: DC
  Administered 2016-12-18 – 2016-12-19 (×2): 80 mg via INTRAVENOUS
  Filled 2016-12-18 (×2): qty 8

## 2016-12-18 MED ORDER — TRAMADOL HCL 50 MG PO TABS
50.0000 mg | ORAL_TABLET | Freq: Every day | ORAL | Status: DC
  Administered 2016-12-18 – 2016-12-19 (×2): 50 mg via ORAL
  Filled 2016-12-18 (×2): qty 1

## 2016-12-18 MED ORDER — LEVOTHYROXINE SODIUM 88 MCG PO TABS
88.0000 ug | ORAL_TABLET | Freq: Every day | ORAL | Status: DC
  Administered 2016-12-19 – 2016-12-20 (×2): 88 ug via ORAL
  Filled 2016-12-18 (×2): qty 1

## 2016-12-18 MED ORDER — POLYETHYLENE GLYCOL 3350 17 G PO PACK
17.0000 g | PACK | Freq: Every day | ORAL | Status: DC | PRN
  Administered 2016-12-18 – 2016-12-20 (×3): 17 g via ORAL
  Filled 2016-12-18 (×3): qty 1

## 2016-12-18 MED ORDER — LIDOCAINE HCL (CARDIAC) 20 MG/ML IV SOLN
INTRAVENOUS | Status: DC | PRN
  Administered 2016-12-18: 80 mg via INTRAVENOUS

## 2016-12-18 MED ORDER — ROPINIROLE HCL 1 MG PO TABS
1.0000 mg | ORAL_TABLET | Freq: Every day | ORAL | Status: DC
  Administered 2016-12-18 – 2016-12-19 (×2): 1 mg via ORAL
  Filled 2016-12-18 (×2): qty 1

## 2016-12-18 MED ORDER — PANTOPRAZOLE SODIUM 40 MG IV SOLR
40.0000 mg | INTRAVENOUS | Status: DC
  Administered 2016-12-18 – 2016-12-19 (×2): 40 mg via INTRAVENOUS
  Filled 2016-12-18 (×2): qty 40

## 2016-12-18 MED ORDER — SODIUM CHLORIDE 0.9% FLUSH: 3.0000 mL | INTRAVENOUS | Status: DC | PRN

## 2016-12-18 MED ORDER — AMIODARONE HCL 200 MG PO TABS
600.0000 mg | ORAL_TABLET | Freq: Two times a day (BID) | ORAL | Status: DC
  Administered 2016-12-18 – 2016-12-20 (×4): 600 mg via ORAL
  Filled 2016-12-18 (×4): qty 3

## 2016-12-18 MED ORDER — SODIUM CHLORIDE 0.9% FLUSH
3.0000 mL | Freq: Two times a day (BID) | INTRAVENOUS | Status: DC
  Administered 2016-12-18 – 2016-12-20 (×5): 3 mL via INTRAVENOUS

## 2016-12-18 MED ORDER — ACETAMINOPHEN 500 MG PO TABS: 500.0000 mg | ORAL_TABLET | Freq: Four times a day (QID) | ORAL | Status: DC | PRN

## 2016-12-18 MED ORDER — ROPINIROLE HCL 1 MG PO TABS
0.5000 mg | ORAL_TABLET | Freq: Every day | ORAL | Status: DC
  Administered 2016-12-18 – 2016-12-20 (×3): 0.5 mg via ORAL
  Filled 2016-12-18 (×2): qty 1

## 2016-12-18 NOTE — Progress Notes (Signed)
PT NOTE:  Pt off the floor at procedure.  Will check back as schedule permits.   Jenna [name] L. Jenna [name], [place] Pager [phone] 12/18/2016

## 2016-12-18 NOTE — Interval H&P Note (Signed)
History and Physical Interval Note:  12/18/2016 11:47 AM  Jenna [name]  has presented today for surgery, with the diagnosis of a fib  The various methods of treatment have been discussed with the patient and family. After consideration of risks, benefits and other options for treatment, the patient has consented to  Procedure(s): CARDIOVERSION (N/A) as a surgical intervention .  The patient's history has been reviewed, patient examined, no change in status, stable for surgery.  I have reviewed the patient's chart and labs.  Questions were answered to the patient's satisfaction.     Skeet Latch, MD 12/18/2016 11:47 AM

## 2016-12-18 NOTE — Transfer of Care (Signed)
Immediate Anesthesia Transfer of Care Note  Patient: Jenna [name]  Procedure(s) Performed: Procedure(s): CARDIOVERSION (N/A)  Patient Location: Endoscopy Unit  Anesthesia Type:General  Level of Consciousness: awake, oriented and patient cooperative  Airway & Oxygen Therapy: Patient Spontanous Breathing and Patient connected to nasal cannula oxygen  Post-op Assessment: Report given to RN and Post -op Vital signs reviewed and stable  Post vital signs: Reviewed  Last Vitals:  Vitals:   12/18/16 1234 12/18/16 1235  BP: 96/69 96/69  Pulse: 65   Resp: 13   Temp:      Last Pain:  Vitals:   12/18/16 1235  TempSrc: Oral  PainSc:       Patients Stated Pain Goal: 0 (41/66/06 3016)  Complications: No apparent anesthesia complications

## 2016-12-18 NOTE — H&P (View-Only) (Signed)
Progress Note  Patient Name: Jenna [name] Date of Encounter: 12/18/2016  Primary Cardiologist: Dr. Debara Pickett  Subjective   Reports one episode of nausea and vomiting yesterday, small amount of yellow fluid. Feeling better today but does report recent nausea ever since 11/2016. No abd pain. Mild constipation noted - has miralax ordered. No recent weight loss. Afebrile. Remains "weak."  Inpatient Medications    Scheduled Meds: . ALPRAZolam  0.5 mg Oral BID  . amiodarone  600 mg Oral BID  . apixaban  5 mg Oral BID  . atorvastatin  40 mg Oral q1800  . buPROPion  150 mg Oral Daily  . carvedilol  3.125 mg Oral BID  . furosemide  80 mg Intravenous BID  . insulin aspart  0-5 Units Subcutaneous QHS  . insulin aspart  0-9 Units Subcutaneous TID WC  . levothyroxine  88 mcg Oral QAC breakfast  . pantoprazole (PROTONIX) IV  40 mg Intravenous Q24H  . potassium chloride  40 mEq Oral TID  . ranolazine  500 mg Oral BID  . rOPINIRole  1 mg Oral QHS   And  . rOPINIRole  0.5 mg Oral Q1200  . sodium chloride flush  3 mL Intravenous Q12H  . sodium chloride flush  3 mL Intravenous Q12H  . traMADol  50 mg Oral QHS   Continuous Infusions: . sodium chloride    . sodium chloride     PRN Meds: acetaminophen, ondansetron (ZOFRAN) IV, polyethylene glycol, sodium chloride flush, sodium chloride flush   Vital Signs    Vitals:   12/16/16 2014 12/17/16 0456 12/17/16 2010 12/18/16 0350  BP: 95/72 114/72 114/83 101/77  Pulse: (!) 112 (!) 121 (!) 126 88  Resp: 17 17 17 17   Temp: 98.4 F (36.9 C) 97.8 F (36.6 C) 97.4 F (36.3 C) 97.5 F (36.4 C)  TempSrc: Oral Oral Axillary Axillary  SpO2: 96% 97% 100% 98%  Weight:  205 lb (93 kg)  203 lb 4.2 oz (92.2 kg)  Height:        Intake/Output Summary (Last 24 hours) at 12/18/16 0916 Last data filed at 12/18/16 0800  Gross per 24 hour  Intake              240 ml  Output              200 ml  Net               40 ml   Filed Weights   12/16/16 0554  12/17/16 0456 12/18/16 0350  Weight: 207 lb 4.8 oz (94 kg) 205 lb (93 kg) 203 lb 4.2 oz (92.2 kg)    Telemetry    Persistent atrial fib rates 100-120s - personally Reviewed  Physical Exam   GEN: No acute distress.  HEENT: Normocephalic, atraumatic, sclera non-icteric. Neck:No JVD or bruits. Cardiac:Irregularly irregular, borderline tachy, no murmurs, rubs, or gallops.  Radials/DP/PT 1+ and equal bilaterally.  Respiratory:Clear to auscultation bilaterally. Breathing is unlabored. EP:PIRJ, nontender, non-distended, BS +x 4. MS:no deformity. Extremities: No clubbing or cyanosis. 1+ bilateral LE edema. Distal pedal pulses are 2+ and equal bilaterally. Neuro:AAOx3. Follows commands. Psych:  Responds to questions appropriately with a normal affect.  Labs    Chemistry Recent Labs Lab 12/15/16 0345 12/17/16 0216 12/18/16 0128  NA 137 138 136  K 4.6 4.7 4.3  CL 91* 92* 94*  CO2 38* 36* 34*  GLUCOSE 125* 113* 140*  BUN 31* 30* 31*  CREATININE 1.39* 1.37* 1.39*  CALCIUM  9.5 9.2 8.7*  PROT  --   --  5.9*  ALBUMIN  --   --  3.1*  AST  --   --  50*  ALT  --   --  129*  ALKPHOS  --   --  98  BILITOT  --   --  0.7  GFRNONAA 36* 37* 36*  GFRAA 42* 43* 42*  ANIONGAP 8 10 8      Hematology Recent Labs Lab 12/18/16 0128  WBC 5.7  RBC 4.42  HGB 11.1*  HCT 35.6*  MCV 80.5  MCH 25.1*  MCHC 31.2  RDW 15.3  PLT 335    Cardiac EnzymesNo results for input(s): TROPONINI in the last 168 hours. No results for input(s): TROPIPOC in the last 168 hours.   BNP Recent Labs Lab 12/13/16 0158  BNP 802.0*     Radiology    No results found.  Cardiac Studies   See below.  Patient Profile     76 y.o. female paroxysmal atrial fib now persistent, recently diagnosed chronic systolic CHF 06/5092, mild AS, GERD, HTN, OA, OSA on CPAP, obesity re-admitted 3/3 with worsening HF and persistent atrial fib. In 11/2016 she was admitted with CHF (new EF 35-40% by echo and 20-25% by  TEE 11/2016) and persistent atrial fib - underwent TEE/DCCV 11/19/16. Had recurrence when seen back in the clinic -> placed on amiodarone with plans for future DCCV. Returned in the interim with above sx. Upon this admission, underwent DCCV 12/11/16 with cardioversion to junctional bradycardia then sinus, but went back into atrial fib as of 12/14/16.  Assessment & Plan    1. Acute on chronic systolic CHF - s/p significant diuresis thus far. Spoke with nursing, I/O's not accurately recorded yesterday (only says 200cc UOP but nurse tech reports more than that). Dry weight said to be 101kg in recent note, now lower than that. Still with mild edema on exam. This is improving on current regimen. Continue IV diuresis. Etiology of cardiomyopathy not definitively known but concerning for tachy-mediated given persistent arrhythmias. Discussed low sodium/fluid restriction with patient this admission.  2. Persistent atrial fib - Amiodarone increased to 600 mg BID by Dr. Caryl Comes - ranolazine 500 mg BID added. EP suggests repeat cardioversion attempt this week when she is euvolemic. Ultimately, would benefit from a-fib ablation +/- CRT-P. Would also need to consider whether or not she would need ICD given her LV dysfunction. Will also await MD input regarding discrepancy of information regarding RV dysfunction - severely reduced by recent TEE but normal by recent echo. Borderline blood pressure prohibits aggressive med titration otherwise. DCCV scheduled for 12:45 with Dr. Oval Linsey today - again reviewed procedure risks/benefits with patient, she is familiar from last two attempts.   3. Cystitis: Reviewed urine culture data and there is no growth. No clinical signs of infection. Off Rocephin.  4. Generalized weakness: feels weak in legs, lives alone, concerned about stability. PT rec SNF upon DC.  5. 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  6. OSA -  patient reports compliance with CPAP.  7. Hyperglycemia - recent A1C 6.7 indicative of new DM. Will need close OP f/u with PCP for further management.  8. Anemia - prior values 12-13, most recently 10-11. Follow for any bleeding.  9. Recently abnormal LFTs 11/2016 - remain elevated slightly higher than last admission. She's had some nonspecific nausea. May be reflective of passive hepatic congestion but will  check hepatitis panel and abdominal US to make sure no other pathology (in case this is driving afib to begin with). Hold statin pending review of above.  Signed, Charlie Pitter, PA-C  12/18/2016, 9:16 AM

## 2016-12-18 NOTE — Anesthesia Postprocedure Evaluation (Signed)
Anesthesia Post Note  Patient: Jenna [name]  Procedure(s) Performed: Procedure(s) (LRB): CARDIOVERSION (N/A)  Patient location during evaluation: Endoscopy Anesthesia Type: MAC Level of consciousness: awake and alert Pain management: pain level controlled Vital Signs Assessment: post-procedure vital signs reviewed and stable Respiratory status: spontaneous breathing Cardiovascular status: stable Postop Assessment: no signs of nausea or vomiting Anesthetic complications: no       Last Vitals:  Vitals:   12/18/16 1234 12/18/16 1235  BP: 96/69 96/69  Pulse: 65   Resp: 13   Temp:      Last Pain:  Vitals:   12/18/16 1235  TempSrc: Oral  PainSc:                  [name]

## 2016-12-18 NOTE — Anesthesia Postprocedure Evaluation (Signed)
Anesthesia Post Note  Patient: Jenna [name]  Procedure(s) Performed: Procedure(s) (LRB): CARDIOVERSION (N/A)  Anesthesia Type: MAC       Last Vitals:  Vitals:   12/18/16 1300 12/18/16 1305  BP: 104/68   Pulse: 66 67  Resp: (!) 22 16  Temp:      Last Pain:  Vitals:   12/18/16 1235  TempSrc: Oral  PainSc:                  [name]

## 2016-12-18 NOTE — Anesthesia Procedure Notes (Signed)
Procedure Name: MAC Date/Time: 12/18/2016 12:25 PM Performed by: Jenne Campus Pre-anesthesia Checklist: Patient identified, Patient being monitored, Timeout performed, Emergency Drugs available and Suction available Patient Re-evaluated:Patient Re-evaluated prior to inductionOxygen Delivery Method: Ambu bag Preoxygenation: Pre-oxygenation with 100% oxygen Intubation Type: IV induction Ventilation: Mask ventilation without difficulty

## 2016-12-18 NOTE — CV Procedure (Signed)
Electrical Cardioversion Procedure Note Jenna [name] [id number] Apr 19, 1941  Procedure: Electrical Cardioversion Indications:  Atrial Fibrillation  Procedure Details Consent: Risks of procedure as well as the alternatives and risks of each were explained to the (patient/caregiver).  Consent for procedure obtained. Time Out: Verified patient identification, verified procedure, site/side was marked, verified correct patient position, special equipment/implants available, medications/allergies/relevent history reviewed, required imaging and test results available.  Performed  Patient placed on cardiac monitor, pulse oximetry, supplemental oxygen as necessary.  Sedation given: Propofol 80 mg Pacer pads placed anterior and posterior chest.  Cardioverted 1 time(s).  Cardioverted at 150J.  Evaluation Findings: Post procedure EKG shows: sinus bradycardia Complications: None Patient did tolerate procedure well.   Skeet Latch, MD 12/18/2016, 12:28 PM

## 2016-12-18 NOTE — Progress Notes (Signed)
Progress Note  Patient Name: Jenna [name] Date of Encounter: 12/18/2016  Primary Cardiologist: Dr. Debara Pickett  Subjective   Reports one episode of nausea and vomiting yesterday, small amount of yellow fluid. Feeling better today but does report recent nausea ever since 11/2016. No abd pain. Mild constipation noted - has miralax ordered. No recent weight loss. Afebrile. Remains "weak."  Inpatient Medications    Scheduled Meds: . ALPRAZolam  0.5 mg Oral BID  . amiodarone  600 mg Oral BID  . apixaban  5 mg Oral BID  . atorvastatin  40 mg Oral q1800  . buPROPion  150 mg Oral Daily  . carvedilol  3.125 mg Oral BID  . furosemide  80 mg Intravenous BID  . insulin aspart  0-5 Units Subcutaneous QHS  . insulin aspart  0-9 Units Subcutaneous TID WC  . levothyroxine  88 mcg Oral QAC breakfast  . pantoprazole (PROTONIX) IV  40 mg Intravenous Q24H  . potassium chloride  40 mEq Oral TID  . ranolazine  500 mg Oral BID  . rOPINIRole  1 mg Oral QHS   And  . rOPINIRole  0.5 mg Oral Q1200  . sodium chloride flush  3 mL Intravenous Q12H  . sodium chloride flush  3 mL Intravenous Q12H  . traMADol  50 mg Oral QHS   Continuous Infusions: . sodium chloride    . sodium chloride     PRN Meds: acetaminophen, ondansetron (ZOFRAN) IV, polyethylene glycol, sodium chloride flush, sodium chloride flush   Vital Signs    Vitals:   12/16/16 2014 12/17/16 0456 12/17/16 2010 12/18/16 0350  BP: 95/72 114/72 114/83 101/77  Pulse: (!) 112 (!) 121 (!) 126 88  Resp: 17 17 17 17   Temp: 98.4 F (36.9 C) 97.8 F (36.6 C) 97.4 F (36.3 C) 97.5 F (36.4 C)  TempSrc: Oral Oral Axillary Axillary  SpO2: 96% 97% 100% 98%  Weight:  205 lb (93 kg)  203 lb 4.2 oz (92.2 kg)  Height:        Intake/Output Summary (Last 24 hours) at 12/18/16 0916 Last data filed at 12/18/16 0800  Gross per 24 hour  Intake              240 ml  Output              200 ml  Net               40 ml   Filed Weights   12/16/16 0554  12/17/16 0456 12/18/16 0350  Weight: 207 lb 4.8 oz (94 kg) 205 lb (93 kg) 203 lb 4.2 oz (92.2 kg)    Telemetry    Persistent atrial fib rates 100-120s - personally Reviewed  Physical Exam   GEN: No acute distress.  HEENT: Normocephalic, atraumatic, sclera non-icteric. Neck:No JVD or bruits. Cardiac:Irregularly irregular, borderline tachy, no murmurs, rubs, or gallops.  Radials/DP/PT 1+ and equal bilaterally.  Respiratory:Clear to auscultation bilaterally. Breathing is unlabored. GE:XBMW, nontender, non-distended, BS +x 4. MS:no deformity. Extremities: No clubbing or cyanosis. 1+ bilateral LE edema. Distal pedal pulses are 2+ and equal bilaterally. Neuro:AAOx3. Follows commands. Psych:  Responds to questions appropriately with a normal affect.  Labs    Chemistry Recent Labs Lab 12/15/16 0345 12/17/16 0216 12/18/16 0128  NA 137 138 136  K 4.6 4.7 4.3  CL 91* 92* 94*  CO2 38* 36* 34*  GLUCOSE 125* 113* 140*  BUN 31* 30* 31*  CREATININE 1.39* 1.37* 1.39*  CALCIUM  9.5 9.2 8.7*  PROT  --   --  5.9*  ALBUMIN  --   --  3.1*  AST  --   --  50*  ALT  --   --  129*  ALKPHOS  --   --  98  BILITOT  --   --  0.7  GFRNONAA 36* 37* 36*  GFRAA 42* 43* 42*  ANIONGAP 8 10 8      Hematology Recent Labs Lab 12/18/16 0128  WBC 5.7  RBC 4.42  HGB 11.1*  HCT 35.6*  MCV 80.5  MCH 25.1*  MCHC 31.2  RDW 15.3  PLT 335    Cardiac EnzymesNo results for input(s): TROPONINI in the last 168 hours. No results for input(s): TROPIPOC in the last 168 hours.   BNP Recent Labs Lab 12/13/16 0158  BNP 802.0*     Radiology    No results found.  Cardiac Studies   See below.  Patient Profile     76 y.o. female paroxysmal atrial fib now persistent, recently diagnosed chronic systolic CHF 0/8676, mild AS, GERD, HTN, OA, OSA on CPAP, obesity re-admitted 3/3 with worsening HF and persistent atrial fib. In 11/2016 she was admitted with CHF (new EF 35-40% by echo and 20-25% by  TEE 11/2016) and persistent atrial fib - underwent TEE/DCCV 11/19/16. Had recurrence when seen back in the clinic -> placed on amiodarone with plans for future DCCV. Returned in the interim with above sx. Upon this admission, underwent DCCV 12/11/16 with cardioversion to junctional bradycardia then sinus, but went back into atrial fib as of 12/14/16.  Assessment & Plan    1. Acute on chronic systolic CHF - s/p significant diuresis thus far. Spoke with nursing, I/O's not accurately recorded yesterday (only says 200cc UOP but nurse tech reports more than that). Dry weight said to be 101kg in recent note, now lower than that. Still with mild edema on exam. This is improving on current regimen. Continue IV diuresis. Etiology of cardiomyopathy not definitively known but concerning for tachy-mediated given persistent arrhythmias. Discussed low sodium/fluid restriction with patient this admission.  2. Persistent atrial fib - Amiodarone increased to 600 mg BID by Dr. Caryl Comes - ranolazine 500 mg BID added. EP suggests repeat cardioversion attempt this week when she is euvolemic. Ultimately, would benefit from a-fib ablation +/- CRT-P. Would also need to consider whether or not she would need ICD given her LV dysfunction. Will also await MD input regarding discrepancy of information regarding RV dysfunction - severely reduced by recent TEE but normal by recent echo. Borderline blood pressure prohibits aggressive med titration otherwise. DCCV scheduled for 12:45 with Dr. Oval Linsey today - again reviewed procedure risks/benefits with patient, she is familiar from last two attempts.   3. Cystitis: Reviewed urine culture data and there is no growth. No clinical signs of infection. Off Rocephin.  4. Generalized weakness: feels weak in legs, lives alone, concerned about stability. PT rec SNF upon DC.  5. 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  6. OSA -  patient reports compliance with CPAP.  7. Hyperglycemia - recent A1C 6.7 indicative of new DM. Will need close OP f/u with PCP for further management.  8. Anemia - prior values 12-13, most recently 10-11. Follow for any bleeding.  9. Recently abnormal LFTs 11/2016 - remain elevated slightly higher than last admission. She's had some nonspecific nausea. May be reflective of passive hepatic congestion but will  check hepatitis panel and abdominal US to make sure no other pathology (in case this is driving afib to begin with). Hold statin pending review of above.  Signed, Charlie Pitter, PA-C  12/18/2016, 9:16 AM

## 2016-12-18 NOTE — Anesthesia Preprocedure Evaluation (Addendum)
Anesthesia Evaluation  Patient identified by MRN, date of birth, ID band Patient awake    Reviewed: Allergy & Precautions, NPO status , Patient's Chart, lab work & pertinent test results  Airway Mallampati: II  TM Distance: >3 FB Neck ROM: Full    Dental  (+) Teeth Intact, Dental Advisory Given   Pulmonary sleep apnea and Continuous Positive Airway Pressure Ventilation ,    breath sounds clear to auscultation       Cardiovascular hypertension, Pt. on medications and Pt. on home beta blockers +CHF (- Left ventricle: The cavity size was dilated. Systolic function)  + Valvular Problems/Murmurs  Rhythm:Irregular Rate:Normal     Neuro/Psych PSYCHIATRIC DISORDERS Anxiety Depression negative neurological ROS     GI/Hepatic GERD  Medicated and Controlled,  Endo/Other  diabetes, Type 2, Oral Hypoglycemic AgentsHypothyroidism   Renal/GU Renal disease     Musculoskeletal   Abdominal   Peds  Hematology   Anesthesia Other Findings   Reproductive/Obstetrics                          Anesthesia Physical Anesthesia Plan  ASA: IV  Anesthesia Plan: MAC   Post-op Pain Management:    Induction:   Airway Management Planned: Simple Face Mask  Additional Equipment:   Intra-op Plan:   Post-operative Plan:   Informed Consent: I have reviewed the patients History and Physical, chart, labs and discussed the procedure including the risks, benefits and alternatives for the proposed anesthesia with the patient or authorized representative who has indicated his/her understanding and acceptance.     Plan Discussed with: CRNA  Anesthesia Plan Comments:         Anesthesia Quick Evaluation

## 2016-12-19 ENCOUNTER — Inpatient Hospital Stay (HOSPITAL_COMMUNITY): Payer: Medicare Other

## 2016-12-19 DIAGNOSIS — G4733 Obstructive sleep apnea (adult) (pediatric): Secondary | ICD-10-CM

## 2016-12-19 DIAGNOSIS — R531 Weakness: Secondary | ICD-10-CM

## 2016-12-19 DIAGNOSIS — N189 Chronic kidney disease, unspecified: Secondary | ICD-10-CM

## 2016-12-19 LAB — CBC
HCT: 37 % (ref 36.0–46.0)
Hemoglobin: 11.6 g/dL — ABNORMAL LOW (ref 12.0–15.0)
MCH: 25.3 pg — ABNORMAL LOW (ref 26.0–34.0)
MCHC: 31.4 g/dL (ref 30.0–36.0)
MCV: 80.8 fL (ref 78.0–100.0)
Platelets: 346 10*3/uL (ref 150–400)
RBC: 4.58 MIL/uL (ref 3.87–5.11)
RDW: 15.8 % — ABNORMAL HIGH (ref 11.5–15.5)
WBC: 6.5 10*3/uL (ref 4.0–10.5)

## 2016-12-19 LAB — HEPATIC FUNCTION PANEL
ALT: 121 U/L — ABNORMAL HIGH (ref 14–54)
AST: 54 U/L — ABNORMAL HIGH (ref 15–41)
Albumin: 3.5 g/dL (ref 3.5–5.0)
Alkaline Phosphatase: 106 U/L (ref 38–126)
Bilirubin, Direct: 0.3 mg/dL (ref 0.1–0.5)
Indirect Bilirubin: 0.7 mg/dL (ref 0.3–0.9)
Total Bilirubin: 1 mg/dL (ref 0.3–1.2)
Total Protein: 6.5 g/dL (ref 6.5–8.1)

## 2016-12-19 LAB — BASIC METABOLIC PANEL
Anion gap: 8 (ref 5–15)
Anion gap: 10 (ref 5–15)
BUN: 37 mg/dL — ABNORMAL HIGH (ref 6–20)
BUN: 36 mg/dL — ABNORMAL HIGH (ref 6–20)
CO2: 31 mmol/L (ref 22–32)
CO2: 34 mmol/L — ABNORMAL HIGH (ref 22–32)
Calcium: 8.9 mg/dL (ref 8.9–10.3)
Calcium: 8.7 mg/dL — ABNORMAL LOW (ref 8.9–10.3)
Chloride: 97 mmol/L — ABNORMAL LOW (ref 101–111)
Chloride: 92 mmol/L — ABNORMAL LOW (ref 101–111)
Creatinine, Ser: 1.72 mg/dL — ABNORMAL HIGH (ref 0.44–1.00)
Creatinine, Ser: 1.68 mg/dL — ABNORMAL HIGH (ref 0.44–1.00)
GFR calc Af Amer: 33 mL/min — ABNORMAL LOW (ref >60)
GFR calc Af Amer: 33 mL/min — ABNORMAL LOW (ref >60)
GFR calc non Af Amer: 28 mL/min — ABNORMAL LOW (ref >60)
GFR calc non Af Amer: 29 mL/min — ABNORMAL LOW (ref >60)
Glucose, Bld: 144 mg/dL — ABNORMAL HIGH (ref 65–99)
Glucose, Bld: 132 mg/dL — ABNORMAL HIGH (ref 65–99)
Potassium: 5.4 mmol/L — ABNORMAL HIGH (ref 3.5–5.1)
Potassium: 4 mmol/L (ref 3.5–5.1)
Sodium: 136 mmol/L (ref 135–145)
Sodium: 136 mmol/L (ref 135–145)

## 2016-12-19 LAB — HEPATITIS PANEL, ACUTE
HCV Ab: <0.1 s/co ratio (ref 0.0–0.9)
Hep A IgM: NEGATIVE
Hep B C IgM: NEGATIVE
Hepatitis B Surface Ag: NEGATIVE

## 2016-12-19 LAB — GLUCOSE, CAPILLARY
Glucose-Capillary: 123 mg/dL — ABNORMAL HIGH (ref 65–99)
Glucose-Capillary: 126 mg/dL — ABNORMAL HIGH (ref 65–99)
Glucose-Capillary: 91 mg/dL (ref 65–99)
Glucose-Capillary: 120 mg/dL — ABNORMAL HIGH (ref 65–99)

## 2016-12-19 MED ORDER — FUROSEMIDE 80 MG PO TABS
80.0000 mg | ORAL_TABLET | Freq: Two times a day (BID) | ORAL | Status: DC
  Administered 2016-12-20: 80 mg via ORAL
  Filled 2016-12-19: qty 1

## 2016-12-19 MED ORDER — GLYCERIN (LAXATIVE) 2.1 G RE SUPP
1.0000 | Freq: Once | RECTAL | Status: DC
  Filled 2016-12-19: qty 1

## 2016-12-19 NOTE — Care Management Important Message (Signed)
Important Message  Patient Details  Name: Jenna [name] MRN: [id number] Date of Birth: 01/12/1941   Medicare Important Message Given:  Yes    Nathen May 12/19/2016, 12:23 PM

## 2016-12-19 NOTE — NC FL2 (Signed)
[place] LEVEL OF CARE SCREENING TOOL     IDENTIFICATION  Patient Name: Jenna [name] Birthdate: 11-08-1940 Sex: female Admission Date (Current Location): 12/07/2016  [facility] and [place] Number:  Herbalist and Address:  [facility], [place] [address] [place], [place] [address]      Provider Number: [id number]  Attending Physician Name and Address:  [name], MD  Relative Name and Phone Number:       Current Level of Care: Hospital Recommended Level of Care: [place] Prior Approval Number:    Date Approved/Denied:   PASRR Number: [id number]  Discharge Plan: SNF    Current Diagnoses: Patient Active Problem List   Diagnosis Date Noted  . Weakness 12/17/2016  . Acute kidney injury superimposed on chronic kidney disease ([place]) 12/17/2016  . Hyperglycemia 12/17/2016  . Normocytic anemia 12/17/2016  . Acute kidney injury ([place]) 12/08/2016  . Acute on chronic systolic congestive heart failure ([place]) 12/08/2016  . Hyperkalemia 12/08/2016  . Nausea & vomiting 12/08/2016  . Arterial hypotension 12/08/2016  . Cystitis   . Persistent atrial fibrillation ([place]) 11/15/2016  . Depressed 07/04/2015  . Peripheral vertigo 12/16/2014  . Vertigo 12/16/2014  . Aortic stenosis, mild 07/26/2013  . HTN (hypertension) 07/26/2013  . Hypothyroidism 07/26/2013  . Obstructive sleep apnea 06/20/2008  . RESTLESS LEGS SYNDROME 06/20/2008    Orientation RESPIRATION BLADDER Height & Weight     Self, Time, Situation, Place  Normal Continent Weight: 205 lb 11.2 oz (93.3 kg) Height:  5' 5.5" (166.4 cm)  BEHAVIORAL SYMPTOMS/MOOD NEUROLOGICAL BOWEL NUTRITION STATUS      Continent Diet (see dc summary)  AMBULATORY STATUS COMMUNICATION OF NEEDS Skin   Limited Assist Verbally Normal                       Personal Care Assistance Level of Assistance  Bathing, Feeding, Dressing Bathing Assistance: Limited assistance Feeding  assistance: Independent Dressing Assistance: Limited assistance     Functional Limitations Info  Sight, Hearing, Speech Sight Info: Adequate Hearing Info: Adequate Speech Info: Adequate    SPECIAL CARE FACTORS FREQUENCY  PT (By licensed PT), OT (By licensed OT)     PT Frequency: 5x week OT Frequency: 5x week            Contractures Contractures Info: Not present    Additional Factors Info  Code Status, Allergies Code Status Info: Full code Allergies Info: AMOXICILLIN, CRESTOR ROSUVASTATIN, LOVASTATIN           Current Medications (12/19/2016):  This is the current hospital active medication list Current Facility-Administered Medications  Medication Dose Route Frequency Provider Last Rate Last Dose  . 0.9 %  sodium chloride infusion  250 mL Intravenous Continuous Skeet Latch, MD      . 0.9 %  sodium chloride infusion  250 mL Intravenous Continuous Skeet Latch, MD      . acetaminophen (TYLENOL) tablet 500-1,000 mg  500-1,000 mg Oral Q6H PRN Skeet Latch, MD      . ALPRAZolam Duanne Moron) tablet 0.5 mg  0.5 mg Oral BID Dayna N Dunn, PA-C   0.5 mg at 12/19/16 1117  . amiodarone (PACERONE) tablet 600 mg  600 mg Oral BID Skeet Latch, MD   600 mg at 12/19/16 1117  . apixaban (ELIQUIS) tablet 5 mg  5 mg Oral BID Skeet Latch, MD   5 mg at 12/19/16 1000  . buPROPion (WELLBUTRIN XL) 24 hr tablet  150 mg  150 mg Oral Daily Skeet Latch, MD   150 mg at 12/19/16 1117  . carvedilol (COREG) tablet 3.125 mg  3.125 mg Oral BID Skeet Latch, MD   3.125 mg at 12/19/16 0836  . [START ON 12/20/2016] furosemide (LASIX) tablet 80 mg  80 mg Oral BID Eileen Stanford, PA-C      . Glycerin (Adult) 2.1 g suppository 1 suppository  1 suppository Rectal Once Eileen Stanford, PA-C      . insulin aspart (novoLOG) injection 0-5 Units  0-5 Units Subcutaneous QHS Skeet Latch, MD      . insulin aspart (novoLOG) injection 0-9 Units  0-9 Units Subcutaneous TID WC Skeet Latch, MD   3 Units at 12/19/16 1223  . levothyroxine (SYNTHROID, LEVOTHROID) tablet 88 mcg  88 mcg Oral QAC breakfast Skeet Latch, MD   88 mcg at 12/19/16 0504  . pantoprazole (PROTONIX) injection 40 mg  40 mg Intravenous Q24H Skeet Latch, MD   40 mg at 12/18/16 2034  . polyethylene glycol (MIRALAX / GLYCOLAX) packet 17 g  17 g Oral Daily PRN Skeet Latch, MD   17 g at 12/19/16 1223  . ranolazine (RANEXA) 12 hr tablet 500 mg  500 mg Oral BID Skeet Latch, MD   500 mg at 12/19/16 1115  . rOPINIRole (REQUIP) tablet 0.5 mg  0.5 mg Oral Q1200 Skeet Latch, MD   0.5 mg at 12/19/16 1117  . rOPINIRole (REQUIP) tablet 1 mg  1 mg Oral QHS Skeet Latch, MD   1 mg at 12/18/16 2203  . sodium chloride flush (NS) 0.9 % injection 3 mL  3 mL Intravenous Q12H Skeet Latch, MD   3 mL at 12/19/16 1000  . sodium chloride flush (NS) 0.9 % injection 3 mL  3 mL Intravenous PRN Skeet Latch, MD      . sodium chloride flush (NS) 0.9 % injection 3 mL  3 mL Intravenous Q12H Skeet Latch, MD   3 mL at 12/18/16 2207  . sodium chloride flush (NS) 0.9 % injection 3 mL  3 mL Intravenous PRN Skeet Latch, MD      . traMADol Veatrice Bourbon) tablet 50 mg  50 mg Oral QHS Skeet Latch, MD   50 mg at 12/18/16 2203     Discharge Medications: Please see discharge summary for a list of discharge medications.  Relevant Imaging Results:  Relevant Lab Results:   Additional Information [id number]  Wende Neighbors, LCSW

## 2016-12-19 NOTE — Progress Notes (Signed)
Physical Therapy Treatment Patient Details Name: Jenna [name] MRN: [id number] DOB: 1941/03/11 Today's Date: 12/19/2016    History of Present Illness 76 y.o.femaleadmitted with CHF, recurrent atrial fib. PMH: cardiomyopathy, chronic systolic CHF, mild AS, HTN, persistent atrial fibrillation, diabetes, restless leg syndrome, RVR, chronic low back pain.    PT Comments    Pt pleasant and able to ambulate with improved posture but continues to fatigue with limited gait, relies on RW and unable to complete further HEP due to weakness and fatigue. Pt educated for HEP and encouraged to continue throughout the day. Will continue to follow.   HR 56-60  Follow Up Recommendations  SNF     Equipment Recommendations       Recommendations for Other Services       Precautions / Restrictions Precautions Precautions: Fall Restrictions Weight Bearing Restrictions: No    Mobility  Bed Mobility               General bed mobility comments: sitting in chair upon arrival  Transfers     Transfers: Sit to/from Stand Sit to Stand: Supervision         General transfer comment: cues for safety x 2 trials  Ambulation/Gait Ambulation/Gait assistance: Supervision Ambulation Distance (Feet): 300 Feet Assistive device: Rolling walker (2 wheeled) Gait Pattern/deviations: Step-through pattern;Decreased stride length;Trunk flexed   Gait velocity interpretation: Below normal speed for age/gender General Gait Details: min cues for posture and position in RW, decreased speed, reliance on RW at this time but improved posture overall   Stairs            Wheelchair Mobility    Modified Rankin (Stroke Patients Only)       Balance     Sitting balance-Leahy Scale: Good       Standing balance-Leahy Scale: Fair                      Cognition Arousal/Alertness: Awake/alert Behavior During Therapy: WFL for tasks assessed/performed Overall Cognitive Status: Within  Functional Limits for tasks assessed                      Exercises General Exercises - Lower Extremity Long Arc Quad: AROM;Both;Seated;15 reps Hip Flexion/Marching: AROM;Both;Seated;15 reps Toe Raises: AROM;Both;Seated;15 reps Heel Raises: AROM;15 reps;Both;Seated    General Comments        Pertinent Vitals/Pain Pain Score: 4  Pain Location: low back  Pain Descriptors / Indicators: Aching Pain Intervention(s): Limited activity within patient's tolerance;Repositioned;Monitored during session    Home Living                      Prior Function            PT Goals (current goals can now be found in the care plan section) Progress towards PT goals: Progressing toward goals    Frequency           PT Plan Current plan remains appropriate    Co-evaluation             End of Session   Activity Tolerance: Patient tolerated treatment well Patient left: in chair;with call bell/phone within reach Nurse Communication: Mobility status PT Visit Diagnosis: Difficulty in walking, not elsewhere classified (R26.2);Muscle weakness (generalized) (M62.81)     Time: 1962-2297 PT Time Calculation (min) (ACUTE ONLY): 24 min  Charges:  $Gait Training: 8-22 mins $Therapeutic Exercise: 8-22 mins  G Codes:       Joe Gee B Antigone Crowell 2016-12-29, 12:56 PM  Elwyn Reach, [place]

## 2016-12-19 NOTE — Progress Notes (Signed)
Received call from [facility] with [facility] to see if pt was eligible for [facility] services- spoke with [place] from [facility]- and pt is not eligible for [facility] services due to having [facility] state plan- d/c plan is for pt to go to SNF.

## 2016-12-19 NOTE — Progress Notes (Signed)
Patient Name: Jenna [name] Date of Encounter: 12/19/2016  Primary Cardiologist: Dr. [facility] Problem List     Active Problems:   Obstructive sleep apnea   Aortic stenosis, mild   HTN (hypertension)   Hypothyroidism   Persistent atrial fibrillation (HCC)   Acute on chronic systolic congestive heart failure (HCC)   Hyperkalemia   Nausea & vomiting   Arterial hypotension   Weakness   Acute kidney injury superimposed on chronic kidney disease (HCC)   Hyperglycemia   Normocytic anemia     Subjective   Feels better. No complaints. Taking a nap.   Inpatient Medications    Scheduled Meds: . ALPRAZolam  0.5 mg Oral BID  . amiodarone  600 mg Oral BID  . apixaban  5 mg Oral BID  . buPROPion  150 mg Oral Daily  . carvedilol  3.125 mg Oral BID  . furosemide  80 mg Intravenous BID  . insulin aspart  0-5 Units Subcutaneous QHS  . insulin aspart  0-9 Units Subcutaneous TID WC  . levothyroxine  88 mcg Oral QAC breakfast  . pantoprazole (PROTONIX) IV  40 mg Intravenous Q24H  . potassium chloride  40 mEq Oral TID  . ranolazine  500 mg Oral BID  . rOPINIRole  0.5 mg Oral Q1200  . rOPINIRole  1 mg Oral QHS  . sodium chloride flush  3 mL Intravenous Q12H  . sodium chloride flush  3 mL Intravenous Q12H  . traMADol  50 mg Oral QHS   Continuous Infusions: . sodium chloride    . sodium chloride     PRN Meds: acetaminophen, polyethylene glycol, sodium chloride flush, sodium chloride flush   Vital Signs    Vitals:   12/18/16 1324 12/18/16 2024 12/19/16 0300 12/19/16 0528  BP: 116/66 120/80  124/68  Pulse: 70 67  (!) 49  Resp: 18 20  20   Temp: 97.6 F (36.4 C) 97.8 F (36.6 C)  97.7 F (36.5 C)  TempSrc: Oral Oral  Oral  SpO2: 97% 96%  97%  Weight:   205 lb 11.2 oz (93.3 kg)   Height:        Intake/Output Summary (Last 24 hours) at 12/19/16 1231 Last data filed at 12/19/16 0919  Gross per 24 hour  Intake             1060 ml  Output             1500 ml  Net              -440 ml   Filed Weights   12/17/16 0456 12/18/16 0350 12/19/16 0300  Weight: 205 lb (93 kg) 203 lb 4.2 oz (92.2 kg) 205 lb 11.2 oz (93.3 kg)    Physical Exam   GEN: Well nourished, well developed, in no acute distress. obese HEENT: Grossly normal.  Neck: Supple, no JVD, carotid bruits, or masses. Cardiac: RR, brady,+ murmur, rubs, or gallops. No clubbing, cyanosis, trace bilateral LE edema.  Radials/DP/PT 2+ and equal bilaterally.  Respiratory:  Respirations regular and unlabored, clear to auscultation bilaterally. GI: Soft, nontender, nondistended, BS + x 4. MS: no deformity or atrophy. Skin: warm and dry, no rash. Neuro:  Strength and sensation are intact. Psych: AAOx3.  Normal affect.  Labs    CBC  Recent Labs  12/18/16 0128 12/19/16 0216  WBC 5.7 6.5  HGB 11.1* 11.6*  HCT 35.6* 37.0  MCV 80.5 80.8  PLT 335 161   Basic Metabolic Panel  Recent Labs  12/18/16 0128 12/19/16 0216  NA 136 136  K 4.3 5.4*  CL 94* 97*  CO2 34* 31  GLUCOSE 140* 144*  BUN 31* 37*  CREATININE 1.39* 1.72*  CALCIUM 8.7* 8.9   Liver Function Tests  Recent Labs  12/18/16 0128 12/19/16 0216  AST 50* 54*  ALT 129* 121*  ALKPHOS 98 106  BILITOT 0.7 1.0  PROT 5.9* 6.5  ALBUMIN 3.1* 3.5    Telemetry    Sinus bradycardia - Personally Reviewed  ECG    Sinus with 1st deg AV block - Personally Reviewed  Radiology    US Abdomen Complete  Result Date: 12/19/2016 CLINICAL DATA:  Abnormal liver function studies, diabetes, hypertension. EXAM: ABDOMEN ULTRASOUND COMPLETE COMPARISON:  None in PACs FINDINGS: Gallbladder: No gallstones or wall thickening visualized. No sonographic Murphy sign noted by sonographer. Common bile duct: Diameter: 3.6 mm Liver: No focal lesion identified. Within normal limits in parenchymal echogenicity. IVC: No abnormality visualized. Pancreas: Visualized portion unremarkable. Spleen: Size and appearance within normal limits. Right Kidney: Length:  11.8 cm. Echogenicity within normal limits. No mass or hydronephrosis visualized. Left Kidney: Length: 10.3 cm. Echogenicity within normal limits. No mass or hydronephrosis visualized. Abdominal aorta: No aneurysm visualized. Other findings: None. IMPRESSION: No gallstones or sonographic evidence of acute cholecystitis. Normal appearance of the liver, spleen, and pancreas. No acute intra-abdominal abnormality is observed. Electronically Signed   By: David  Martinique M.D.   On: 12/19/2016 11:01    Cardiac Studies   2D ECHO: 11/16/2016 LV EF: 35% -   40% Study Conclusions - Left ventricle: The cavity size was mildly dilated. Wall   thickness was normal. Systolic function was moderately reduced.   The estimated ejection fraction was in the range of 35% to 40%. - Aortic valve: There was mild stenosis. Valve area (VTI): 0.71   cm^2. Valve area (Vmax): 0.7 cm^2. Valve area (Vmean): 0.67 cm^2. - Mitral valve: There was mild to moderate regurgitation. - Tricuspid valve: There was moderate regurgitation.  Patient Profile     76 y.o. female paroxysmal atrial fib now persistent, recently diagnosedchronic systolic CHF 06/1693, mild AS,GERD, HTN, OA, OSA on CPAP, obesity re-admitted 3/3 with worsening HF and persistent atrial fib. In 11/2016 she was admitted with CHF (new EF 35-40% by echo and 20-25% by TEE 11/2016) and persistent atrial fib - underwent TEE/DCCV 11/19/16. Had recurrence when seen back in the clinic -> placed on amiodarone with plans for future DCCV. Returned in the interim with above sx. Upon this admission, underwent DCCV 12/11/16 with cardioversion to junctional bradycardia then sinus, but went back into atrial fib as of 12/14/16  Assessment & Plan    1. Acute on chronic systolic CHF: s/p significant diuresis (I/Os not accurate). Weights down 23 lbs (228--> 205).  Dry weight said to be ~223 lbs in recent note, now significantly lower than that. Would make this her new dry weight. Currently on IV  lasix 80mg  BID. Now creat starting to bump, so will stop IV lasix. Start PO lasix 80mg  BID tomorrow.   Etiology of cardiomyopathy not definitively known but concerning for tachy-mediated given persistent arrhythmias. Discussed low sodium/fluid restriction with patient this admission.  2. Persistent atrial fib: Amiodarone increased to 600 mg BID by Dr. Caryl Comes and added ranolazine 500 mg BID. She underwent repeat DCCV yesterday, which was successful. HR in 40-50s. Continue to monitor. Ultimately, would benefit from a-fib ablation +/- CRT-P. Would also need to consider whether or not she would  need ICD given her LV dysfunction.   3. Cystitis: Reviewed urine culture data and there is no growth. No clinical signs of infection. Off Rocephin.  4. Generalized weakness:  PT rec SNF upon DC.  5. 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   6. OSA: patient reports compliance with CPAP.  7. Hyperglycemia: recent A1C 6.7 indicative of new DM. Will need close OP f/u with PCP for further management.  8. Anemia - prior values 12-13, most recently 10-11. Follow for any bleeding.  9. Recently abnormal LFTs 11/2016- remain elevated slightly higher than last admission. She's had some nonspecific nausea. Likely reflective of passive hepatic congestion. Hepatitis panel and abdominal US were normal. Statin currently held.  10. Hyperkalemia: currently on Kdur 40 Meq TID. Will stop this now. Recheck BMET later this evening.   Dispo: likely home tomorrow. Discussed with SW who will work on getting her SNF placement (has multiple offers, just needs to choose which facility she would like to go to)   Signed, Angelena Form, PA-C  12/19/2016, 12:31 PM

## 2016-12-19 NOTE — Consult Note (Signed)
   [facility] CM Inpatient Consult   12/19/2016  GIADA SCHOPPE 30-Jul-1941 [id number]   Request received by inpatient RNCM to check for [place] registry for this [facility] member. Thank you for this consult.  Patient evaluated for [place] Management services.  Patient is not eligible for [facility] Care Management services as she is not in the [place] and her insurance is not attributed to the [place].  This patient is Not eligible for [facility] Care Management Services.   Reason:  Not a beneficiary currently attributed plan to one of the [place] Registry populations.  Membership roster was used to verify non- eligible status. Thanks for the referral.  Natividad Brood, RN BSN [facility] Liaison  [phone] business mobile phone Toll free office [phone]

## 2016-12-20 ENCOUNTER — Other Ambulatory Visit: Payer: Self-pay | Admitting: Physician Assistant

## 2016-12-20 ENCOUNTER — Encounter (HOSPITAL_COMMUNITY): Payer: Self-pay | Admitting: Physician Assistant

## 2016-12-20 DIAGNOSIS — I10 Essential (primary) hypertension: Secondary | ICD-10-CM

## 2016-12-20 LAB — BASIC METABOLIC PANEL
Anion gap: 8 (ref 5–15)
BUN: 35 mg/dL — ABNORMAL HIGH (ref 6–20)
CO2: 33 mmol/L — ABNORMAL HIGH (ref 22–32)
Calcium: 9 mg/dL (ref 8.9–10.3)
Chloride: 94 mmol/L — ABNORMAL LOW (ref 101–111)
Creatinine, Ser: 1.56 mg/dL — ABNORMAL HIGH (ref 0.44–1.00)
GFR calc Af Amer: 37 mL/min — ABNORMAL LOW (ref >60)
GFR calc non Af Amer: 32 mL/min — ABNORMAL LOW (ref >60)
Glucose, Bld: 79 mg/dL (ref 65–99)
Potassium: 4 mmol/L (ref 3.5–5.1)
Sodium: 135 mmol/L (ref 135–145)

## 2016-12-20 LAB — CBC
HCT: 37 % (ref 36.0–46.0)
Hemoglobin: 11.5 g/dL — ABNORMAL LOW (ref 12.0–15.0)
MCH: 25.1 pg — ABNORMAL LOW (ref 26.0–34.0)
MCHC: 31.1 g/dL (ref 30.0–36.0)
MCV: 80.8 fL (ref 78.0–100.0)
Platelets: 307 10*3/uL (ref 150–400)
RBC: 4.58 MIL/uL (ref 3.87–5.11)
RDW: 16.1 % — ABNORMAL HIGH (ref 11.5–15.5)
WBC: 5.9 10*3/uL (ref 4.0–10.5)

## 2016-12-20 LAB — GLUCOSE, CAPILLARY
Glucose-Capillary: 78 mg/dL (ref 65–99)
Glucose-Capillary: 153 mg/dL — ABNORMAL HIGH (ref 65–99)

## 2016-12-20 MED ORDER — POTASSIUM CHLORIDE CRYS ER 20 MEQ PO TBCR: 40.0000 meq | EXTENDED_RELEASE_TABLET | Freq: Two times a day (BID) | ORAL | 3 refills | Status: DC

## 2016-12-20 MED ORDER — AMIODARONE HCL 400 MG PO TABS: 600.0000 mg | ORAL_TABLET | Freq: Two times a day (BID) | ORAL | 1 refills | Status: DC

## 2016-12-20 MED ORDER — CARVEDILOL 3.125 MG PO TABS: 3.1250 mg | ORAL_TABLET | Freq: Two times a day (BID) | ORAL | 6 refills | Status: DC

## 2016-12-20 MED ORDER — RANOLAZINE ER 500 MG PO TB12: 500.0000 mg | ORAL_TABLET | Freq: Two times a day (BID) | ORAL | 6 refills | Status: DC

## 2016-12-20 MED ORDER — FUROSEMIDE 80 MG PO TABS: 80.0000 mg | ORAL_TABLET | Freq: Two times a day (BID) | ORAL | 6 refills | Status: DC

## 2016-12-20 NOTE — Progress Notes (Signed)
Clinical Social Worker facilitated patient discharge including contacting patient family and facility to confirm patient discharge plans.  Clinical information faxed to facility and family agreeable with plan. Patients son Jenny Reichmann) will be picking patient up from [facility] and transporting patient to facility via personal vehicle  .  RN Joellen Jersey to call [phone] will be in room 37)  for report prior to discharge.  Clinical Social Worker will sign off for now as social work intervention is no longer needed. Please consult [place] again if new need arises.  Rhea Pink, MSW, [place]

## 2016-12-20 NOTE — Progress Notes (Signed)
Discharge instructions printed and reviewed with patient, and copy given for them to take home. All questions addressed at this time. New prescriptions reviewed. IVs removed. Room searched for patient belongings, and confirmed with patient that all valuables were accounted for. Awaiting for son to arrive to transport patient to rehab facility.

## 2016-12-20 NOTE — Care Management Note (Signed)
Case Management Note Marvetta Gibbons RN, BSN Unit 2W-Case Manager [phone]  Patient Details  Name: Jenna [name] MRN: [id number] Date of Birth: [birth year]  Subjective/Objective:  Pt admitted with Acute on Chronic HF                  Action/Plan: PTA pt lived at home-recommendations for SNF- CSW following for placement needs-- received call from [facility] regarding pt to see if pt eligible for [facility] services- spoke with [place] with [facility]- however pt is not eligible for [facility] services due to having a state plan with [place] contact for D/C planning needs is Orson Slick at [phone]  Expected Discharge Date:  12/20/16               Expected Discharge Plan:  [place]  In-House Referral:  Clinical Social Work  Discharge planning Services  CM Consult  Post Acute Care Choice:  NA Choice offered to:  NA  DME Arranged:    DME Agency:     HH Arranged:    [place] Agency:     Status of Service:  Completed, signed off  If discussed at [employer] of Stay Meetings, dates discussed:  3/13, 3/15  Discharge Disposition: skilled facility   Additional Comments:  12/20/16- 1000- Mayfield Schoene RN, CM- pt stable for d/c today- plan is for SNF- CSW following for placement needs.   Dawayne Patricia, RN 12/20/2016, 10:02 AM

## 2016-12-20 NOTE — Progress Notes (Signed)
Called report to nurse Joelene Millin at [facility]. Reviewed patient's HPI, PMH, current condition, discharge medications, and most recent lab work. Staff assisted patient to dress and pack room. Currently awaiting patient's son to arrive to transport patient to SNF for rehab.

## 2016-12-20 NOTE — Discharge Summary (Signed)
Discharge Summary    Patient ID: Jenna [name],  MRN: [id number], DOB/AGE: 1941-08-21 76 y.o.  Admit date: 12/07/2016 Discharge date: 12/20/2016  Primary Care Provider: Chesley Noon Primary Cardiologist: Dr. Debara Pickett   Discharge Diagnoses    Principal Problem:   Acute on chronic systolic congestive heart failure ([place])   Persistent atrial fibrillation (HCC) Active Problems:   Obstructive sleep apnea   Aortic stenosis, mild   HTN (hypertension)   Hypothyroidism   Hyperkalemia   Arterial hypotension   Weakness   Acute kidney injury superimposed on chronic kidney disease (HCC)   Hyperglycemia   Normocytic anemia   Allergies Allergies  Allergen Reactions  . Amoxicillin Nausea Only  . Crestor [Rosuvastatin] Other (See Comments)    Joints ached  . Lovastatin Other (See Comments)    Joints hurt all over body      History of Present Illness     76 y.o.femaleparoxysmal atrial fib now persistent, recently diagnosedchronic systolic CHF 10/3084, mild AS,GERD, HTN, OA, OSA on CPAP, and obesity who presented [facility]16 with worsening CHF and persistent atrial fib.   In 11/2016 she was admitted with afib with RVR and CHF (new EF 35-40% by echo and 20-25% by TEE 11/2016) and persistent atrial fib - underwent TEE/DCCV 11/19/16. Had recurrence of afib when seen back in the clinic and placed on amiodarone with plans for future DCCV. However, she ended up re presenting to the hospital on 3/3 with SOB and weight gain. She was admitted for rate control and IV diuresis.  Hospital Course     Consultants: EP  Persistent Afib with RVR: afib was very difficult to control given sinus bradycardia and hypotension. She underwent DCCV 12/11/16 with cardioversion to junctional bradycardia then sinus, but went back into atrial fib as of 12/14/16. Dr. Caryl Comes with EP was consulted for help in managing afib. He recommended amio 600mg  BID with the addition of Ranexa 500mg  BID to augment the  antiarrhythmic effects of the amiodarone. She underwent successful repeat DCCV once adequately diuresed on 3/14 and has been maintaining NSR since. Will plan to continue amio and ranexa at current doses at discharge with plans for follow up in afib clinic to taper down. -- Ultimately, if she cannot maintain NSR long term, she would benefit from a-fib ablation +/- CRT-P. Would also need to consider whether or not she would need ICD given her LV dysfunction.  -- Continue Eliquis 5mg  BID for CHADSVASC of at least 6 (CHF, HTN, age, DM, 48 dz, F sex) -- I have arranged for follow up with Roderic Palau NP in the [place] clinic 3/27  Acute on chronic systolic CHF: s/p significant diuresis. Net neg 13.7L. Weights down 22 lbs (228--> 206).  Dry weight said to be ~223 lbs in recent note, now significantly lower than that. Would make ~206 her new dry weight. She was diuresed with IV lasix 80mg  BID and converted to PO lasix today given bump in creat. She will be discharged on PO lasix 80mg  BID.  -- Unfortunately, hypotension and sinus bradycardia have limited our ability to add CHF meds. Currently on Coreg 3.125mg  BID. Would add back ACE/ARB or Entresto at a later date.  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 Also, there was some discussion about significant RV dysfunction seen on TEE but not TTE. -- Discussed low sodium/fluid restriction with patient this admission.  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 It peaked at 1.72 and IV lasix discontinued. Discharge creat 1.56. BMET at follow up.  Hyperkalemia: her K did get up to 5.4 on Kdur 60mEq TID. This was discontinued and K has normalized. I will discharge her on Kdur 59mEq BID. She will need a BMET in 1 week at [facility] visit.   OSA: patient reports  compliance with CPAP.  Recently abnormal LFTs 11/2016:  remain elevated slightly higher than last admission. Likely reflective of passive hepatic congestion. Hepatitis panel and abdominal US were normal. Statin currently held.  The patient has had an uncomplicated hospital course and is recovering well. She has been seen by Dr. Debara Pickett today and deemed ready for discharge SNF.  All follow-up appointments have been scheduled. Discharge medications are listed below.  _____________  Discharge Vitals Blood pressure 116/68, pulse 63, temperature 97.9 F (36.6 C), temperature source Oral, resp. rate 18, height 5' 5.5" (1.664 m), weight 206 lb 1.6 oz (93.5 kg), SpO2 99 %.  Filed Weights   12/18/16 0350 12/19/16 0300 12/20/16 0624  Weight: 203 lb 4.2 oz (92.2 kg) 205 lb 11.2 oz (93.3 kg) 206 lb 1.6 oz (93.5 kg)    GEN: Well nourished, well developed, in no acute distress. obese HEENT: Grossly normal.  Neck: Supple, no JVD, carotid bruits, or masses. Cardiac: RR, brady,+ murmur, rubs, or gallops. No clubbing, cyanosis, trace bilateral LE edema.  Radials/DP/PT 2+ and equal bilaterally.  Respiratory:  Respirations regular and unlabored, clear to auscultation bilaterally. GI: Soft, nontender, nondistended, BS + x 4. MS: no deformity or atrophy. Skin: warm and dry, no rash. Neuro:  Strength and sensation are intact. Psych: AAOx3.  Normal affect.   Labs & Radiologic Studies     CBC  Recent Labs  12/19/16 0216 12/20/16 0244  WBC 6.5 5.9  HGB 11.6* 11.5*  HCT 37.0 37.0  MCV 80.8 80.8  PLT 346 644   Basic Metabolic Panel  Recent Labs  12/19/16 1358 12/20/16 0244  NA 136 135  K 4.0 4.0  CL 92* 94*  CO2 34* 33*  GLUCOSE 132* 79  BUN 36* 35*  CREATININE 1.68* 1.56*  CALCIUM 8.7* 9.0   Liver Function Tests  Recent Labs  12/18/16 0128 12/19/16 0216  AST 50* 54*  ALT 129* 121*  ALKPHOS 98 106  BILITOT 0.7 1.0  PROT 5.9* 6.5  ALBUMIN 3.1* 3.5   No results for input(s):  LIPASE, AMYLASE in the last 72 hours. Cardiac Enzymes No results for input(s): CKTOTAL, CKMB, CKMBINDEX, TROPONINI in the last 72 hours. BNP Invalid input(s): POCBNP D-Dimer No results for input(s): DDIMER in the last 72 hours. Hemoglobin A1C No results for input(s): HGBA1C in the last 72 hours. Fasting Lipid Panel No results for input(s): CHOL, HDL, LDLCALC, TRIG, CHOLHDL, LDLDIRECT in the last 72 hours. Thyroid Function Tests No results for input(s): TSH, T4TOTAL, T3FREE, THYROIDAB in the last 72 hours.  Invalid input(s): FREET3  US Abdomen Complete  Result Date: 12/19/2016 CLINICAL DATA:  Abnormal liver function studies, diabetes, hypertension. EXAM: ABDOMEN ULTRASOUND COMPLETE COMPARISON:  None in PACs FINDINGS: Gallbladder: No gallstones or wall thickening visualized. No sonographic Murphy sign noted by sonographer. Common bile duct: Diameter: 3.6 mm Liver: No focal lesion identified. Within normal limits in parenchymal echogenicity. IVC: No abnormality visualized. Pancreas: Visualized portion unremarkable. Spleen: Size and appearance within normal limits. Right Kidney: Length: 11.8 cm. Echogenicity within normal  limits. No mass or hydronephrosis visualized. Left Kidney: Length: 10.3 cm. Echogenicity within normal limits. No mass or hydronephrosis visualized. Abdominal aorta: No aneurysm visualized. Other findings: None. IMPRESSION: No gallstones or sonographic evidence of acute cholecystitis. Normal appearance of the liver, spleen, and pancreas. No acute intra-abdominal abnormality is observed. Electronically Signed   By: David  Martinique M.D.   On: 12/19/2016 11:01   Dg Chest Port 1 View  Result Date: 12/07/2016 CLINICAL DATA:  Shortness of breath and weakness EXAM: PORTABLE CHEST 1 VIEW COMPARISON:  October 07, 2004 FINDINGS: The study is limited due to the low volume portable technique. Probable atelectasis in the left base. Stable cardiomegaly. No other interval changes or acute  abnormalities. IMPRESSION: Limited study to technique. Mild left basilar atelectasis. No other acute abnormalities. Electronically Signed   By: Dorise Bullion III M.D   On: 12/07/2016 22:21     Diagnostic Studies/Procedures    2D ECHO: 11/16/2016 LV EF: 35% - 40% Study Conclusions - Left ventricle: The cavity size was mildly dilated. Wall thickness was normal. Systolic function was moderately reduced. The estimated ejection fraction was in the range of 35% to 40%. - Aortic valve: There was mild stenosis. Valve area (VTI): 0.71 cm^2. Valve area (Vmax): 0.7 cm^2. Valve area (Vmean): 0.67 cm^2. - Mitral valve: There was mild to moderate regurgitation. - Tricuspid valve: There was moderate regurgitation. _____________    Disposition   Pt is being discharged home today in good condition.  Follow-up Plans & Appointments    Follow-up Information    CARROLL,DONNA, NP. Go on 12/31/2016.   Specialties:  Nurse Practitioner, Cardiology Why:  @ 11 am. garage code [id number]. Please call [phone] if you have any trouble getting in through the construction Contact information: [place] [address] [phone]        Hao Meng, [place]. Go on 12/27/2016.   Specialties:  Cardiology, Radiology Why:  @ 8:30am for you appointment with Almyra Deforest. Please arrive at least 10 minutes early.  Contact information: [address] [place] [address] [phone]            Discharge Medications     Medication List    STOP taking these medications   atorvastatin 40 MG tablet Commonly known as:  LIPITOR   metoprolol tartrate 25 MG tablet Commonly known as:  LOPRESSOR   torsemide 5 MG tablet Commonly known as:  DEMADEX     TAKE these medications   acarbose 25 MG tablet Commonly known as:  PRECOSE Take 25 mg by mouth 3 (three) times daily.   acetaminophen 500 MG tablet Commonly known as:  TYLENOL Take 500-1,000 mg by mouth every 6 (six) hours as  needed for mild pain, moderate pain or headache.   ALPRAZolam 0.5 MG tablet Commonly known as:  XANAX Take 1 tablet by mouth 2 (two) times daily.   amiodarone 400 MG tablet Commonly known as:  PACERONE Take 1.5 tablets (600 mg total) by mouth 2 (two) times daily. What changed:  medication strength  how much to take  when to take this   apixaban 5 MG Tabs tablet Commonly known as:  ELIQUIS Take 1 tablet (5 mg total) by mouth 2 (two) times daily.   buPROPion 150 MG 24 hr tablet Commonly known as:  WELLBUTRIN XL TAKE ONE TABLET (150 MG TOTAL) BY MOUTH TWICE DAILY   CALCIUM + D PO Take 600 mg by mouth 2 (two) times daily.   carvedilol 3.125 MG  tablet Commonly known as:  COREG Take 1 tablet (3.125 mg total) by mouth 2 (two) times daily.   CHERRY PO Take by mouth. Drink 1/2 cup before breakfast and 1/2 cup before supper   Fish Oil 1200 MG Caps Take 1 capsule by mouth 2 (two) times daily. Take 1 at breakfast and 1 at supper   furosemide 80 MG tablet Commonly known as:  LASIX Take 1 tablet (80 mg total) by mouth 2 (two) times daily.   GINGER PO Take 1 capsule by mouth 2 (two) times daily.   levothyroxine 88 MCG tablet Commonly known as:  SYNTHROID, LEVOTHROID Take 88 mcg by mouth daily before breakfast.   Magnesium 250 MG Tabs Take 2 tablets by mouth daily.   meclizine 25 MG tablet Commonly known as:  ANTIVERT Take 1 tablet (25 mg total) by mouth 3 (three) times daily as needed for dizziness.   Misc. Devices Misc by Does not apply route at bedtime. CPAP   omeprazole 40 MG capsule Commonly known as:  PRILOSEC TAKE ONE CAPSULE BY MOUTH EVERY DAY   potassium chloride SA 20 MEQ tablet Commonly known as:  KLOR-CON M20 Take 2 tablets (40 mEq total) by mouth 2 (two) times daily. What changed:  how much to take   PRESERVISION AREDS 2 Caps Take 1 capsule by mouth 2 (two) times daily.   ranolazine 500 MG 12 hr tablet Commonly known as:  RANEXA Take 1 tablet (500  mg total) by mouth 2 (two) times daily.   rOPINIRole 0.5 MG tablet Commonly known as:  REQUIP Take 2-3 daily at bedtime as needed What changed:  how much to take  how to take this  when to take this  additional instructions   traMADol 50 MG tablet Commonly known as:  ULTRAM Take 50 mg by mouth at bedtime.         Outstanding Labs/Studies   BMET  Duration of Discharge Encounter   Greater than 30 minutes including physician time.  Signed, Angelena Form PA-C 12/20/2016, 10:10 AM

## 2016-12-27 ENCOUNTER — Encounter: Payer: Self-pay | Admitting: Physician Assistant

## 2016-12-27 ENCOUNTER — Ambulatory Visit: Payer: Medicare Other | Admitting: Physician Assistant

## 2016-12-27 ENCOUNTER — Ambulatory Visit (INDEPENDENT_AMBULATORY_CARE_PROVIDER_SITE_OTHER): Payer: Medicare Other | Admitting: Physician Assistant

## 2016-12-27 VITALS — BP 102/80 | HR 115 | Ht 65.5 in | Wt 192.0 lb

## 2016-12-27 DIAGNOSIS — E119 Type 2 diabetes mellitus without complications: Secondary | ICD-10-CM | POA: Diagnosis not present

## 2016-12-27 DIAGNOSIS — E039 Hypothyroidism, unspecified: Secondary | ICD-10-CM | POA: Diagnosis not present

## 2016-12-27 DIAGNOSIS — I480 Paroxysmal atrial fibrillation: Secondary | ICD-10-CM

## 2016-12-27 DIAGNOSIS — N289 Disorder of kidney and ureter, unspecified: Secondary | ICD-10-CM | POA: Diagnosis not present

## 2016-12-27 DIAGNOSIS — I10 Essential (primary) hypertension: Secondary | ICD-10-CM

## 2016-12-27 DIAGNOSIS — R0600 Dyspnea, unspecified: Secondary | ICD-10-CM

## 2016-12-27 DIAGNOSIS — E785 Hyperlipidemia, unspecified: Secondary | ICD-10-CM | POA: Diagnosis not present

## 2016-12-27 DIAGNOSIS — I5022 Chronic systolic (congestive) heart failure: Secondary | ICD-10-CM | POA: Diagnosis not present

## 2016-12-27 DIAGNOSIS — N189 Chronic kidney disease, unspecified: Secondary | ICD-10-CM

## 2016-12-27 DIAGNOSIS — R0609 Other forms of dyspnea: Secondary | ICD-10-CM | POA: Diagnosis not present

## 2016-12-27 LAB — COMPLETE METABOLIC PANEL WITH GFR
ALT: 37 U/L — ABNORMAL HIGH (ref 6–29)
AST: 28 U/L (ref 10–35)
Albumin: 4 g/dL (ref 3.6–5.1)
Alkaline Phosphatase: 100 U/L (ref 33–130)
BUN: 34 mg/dL — ABNORMAL HIGH (ref 7–25)
CO2: 34 mmol/L — ABNORMAL HIGH (ref 20–31)
Calcium: 9.6 mg/dL (ref 8.6–10.4)
Chloride: 99 mmol/L (ref 98–110)
Creat: 1.67 mg/dL — ABNORMAL HIGH (ref 0.60–0.93)
GFR, Est African American: 34 mL/min — ABNORMAL LOW (ref >=60)
GFR, Est Non African American: 30 mL/min — ABNORMAL LOW (ref >=60)
Glucose, Bld: 89 mg/dL (ref 65–99)
Potassium: 4 mmol/L (ref 3.5–5.3)
Sodium: 143 mmol/L (ref 135–146)
Total Bilirubin: 1.1 mg/dL (ref 0.2–1.2)
Total Protein: 7.3 g/dL (ref 6.1–8.1)

## 2016-12-27 MED ORDER — AMIODARONE HCL 400 MG PO TABS
400.0000 mg | ORAL_TABLET | Freq: Two times a day (BID) | ORAL | 1 refills | Status: DC
Start: 2016-12-27 — End: 2017-01-11

## 2016-12-27 MED ORDER — CARVEDILOL 6.25 MG PO TABS: 6.2500 mg | ORAL_TABLET | Freq: Two times a day (BID) | ORAL | 1 refills | Status: DC

## 2016-12-27 NOTE — Progress Notes (Signed)
Cardiology Office Note    Date:  12/27/2016   ID:  Jenna [name], DOB 24-Jun-1941, MRN [id number]  PCP:  Chesley Noon, MD  Cardiologist:  Dr. Debara Pickett and Dr. Sallyanne Kuster  Chief Complaint  Patient presents with  . Hospitalization Follow-up    seen for Dr. Debara Pickett    History of Present Illness:  Jenna [name] is a 76 y.o. female with PMH of DM II, hypertension, hypothyroidism, OSA on CPAP and recurrent atrial fibrillation. Her TSH was normal in April 2017. She had a normal stress test with normal LVEF in 2013. She had echocardiogram in 2013 that showed a normal EF. Previous treatment included diltiazem which led to weight gain, she has been transitioned to carvedilol 12.5 mg twice a day. She was seen the cardiology office on 11/15/2016, she looked ill at the time and her blood pressure did not allow up titration in the outpatient fashion. Therefore patient was admitted to the hospital. Echocardiogram obtained on 11/16/2016 showed EF down to 35-40%, mild-to-moderate MR, moderate TR. Ejection fraction has decreased since previous 2013 echo. She was started on eliquis and he eventually underwent TEE guided cardioversion on 11/19/2016. He had attempted DCCV that there are 122 under 150 J both of which were unsuccessful, however she finally had successful cardioversion at 200 J. Post-cardioversion, she had significant bradycardia with heart rate down to 33 and the blood pressure was 62/42. She was given a single dose of atropine with improvement in both the heart rate in the blood pressure. Diltiazem was discontinued. The Echo also showed severe RV dysfunction, mild to moderate MR, moderate TR. Herlosartan was decreased to 80 mg.  I first saw the patient on 11/27/2016 as outpatient follow-up after her cardioversion, however she already went back into atrial fibrillation with RVR at the time. She also appears to be volume overloaded, I placed her on a higher dose of torsemide however later has to scale back  because of worsening renal function. I also loaded her on amiodarone after discussing with Dr. Debara Pickett as she likely will continue to have recurrent atrial fibrillation with just rate control. I temporarily held her valsartan to create blood pressure room for rate control therapy. She was scheduled for outpatient cardioversion on 12/11/2016, unfortunately she went to the hospital on 12/07/2016 for worsening dyspnea on exertion. She was aggressively diuresed with IV Lasix. She underwent cardioversion again on 12/11/2016. She had a recurrence of atrial fibrillation on 12/14/2016. He was seen by Dr. Caryl Comes as consultation on 12/15/2016, her amiodarone was increased to 600 mg twice a day. Ranolazine 500 mg twice a day was also added. She underwent successful repeat cardioversion on 12/18/2016 and was discharged eventually on 12/20/2016 after transition to 80 mg twice a day of oral Lasix. If she fails medical therapy, we can potentially consider ablation +/- CRT-P in the future.  She presents today for follow-up, unfortunately EKG has already demonstrated recurrent atrial fibrillation with RVR. Surprisingly, vital signs in records sent over by [facility] indicate that her heart rate has been in the 50 to 70s for the past 3 days. Her blood pressure was also in the 120 to 140s at the skilled nursing facility but here her blood pressure was 102/80. She is on minimal dose of carvedilol, I will be increased to 6.25 mg twice a day. I have instructed skilled nursing facility to check blood pressure at least twice a day. I did talk to Dr. Debara Pickett, she is on very high dose of 600 mg  twice a day amiodarone, as this point, I do not think continued loading would have a chance of converting her. I will decrease amiodarone to 400 mg twice a day. She has a follow-up with our atrial fibrillation clinic, I do not think there is much to offer her from medicine perspective. After talking with Dr. Debara Pickett, we decided to cancel her atrial  fibrillation clinic visit and refer her directly for urgent electrophysiology referral to want the EP physicians. Although she has been seen by Dr. Caryl Comes in the hospital, however I do not think she can wait for Dr. Olin Pia schedule to open up, we will refer her to whoever has a earliest opening possible.   Past Medical History:  Diagnosis Date  . Anxiety   . Chronic lower back pain   . Depression   . Fatigue   . GERD (gastroesophageal reflux disease)   . High cholesterol   . Hypertension   . Hypothyroidism   . Melanoma of shoulder, left ([place])   . Mild aortic stenosis   . OSA on CPAP    "I wear pillows" (11/15/2016)  . Osteoarthritis   . Palpitations   . Persistent atrial fibrillation with RVR ([place])    Jenna [name] 11/15/2016  . RLS (restless legs syndrome)   . Type II diabetes mellitus ([place])     Past Surgical History:  Procedure Laterality Date  . CARDIOVERSION N/A 11/19/2016   Procedure: CARDIOVERSION;  Surgeon: Lelon Perla, MD;  Location: [facility] ENDOSCOPY;  Service: Cardiovascular;  Laterality: N/A;  . CARDIOVERSION N/A 12/11/2016   Procedure: CARDIOVERSION;  Surgeon: Dorothy Spark, MD;  Location: [facility] ENDOSCOPY;  Service: Cardiovascular;  Laterality: N/A;  . CARDIOVERSION N/A 12/18/2016   Procedure: CARDIOVERSION;  Surgeon: Skeet Latch, MD;  Location: [facility];  Service: Cardiovascular;  Laterality: N/A;  . CATARACT EXTRACTION W/ INTRAOCULAR LENS  IMPLANT, BILATERAL Bilateral   . DILATION AND CURETTAGE OF UTERUS    . LEXISCAN MYOVIEW  05/13/2012   No ECG changes. EKG negative for ischemia. No significant ischemia demonstrated.  [name] MELANOMA EXCISION Left ~ 09/1979   "back of my shoulder"  . TEE WITHOUT CARDIOVERSION N/A 11/19/2016   Procedure: TRANSESOPHAGEAL ECHOCARDIOGRAM (TEE);  Surgeon: Lelon Perla, MD;  Location: [facility] ENDOSCOPY;  Service: Cardiovascular;  Laterality: N/A;  need CV, but no anesthesia available  . TRANSTHORACIC ECHOCARDIOGRAM  02/18/2012   EF >55%, mild  aortic stenosis  . VAGINAL HYSTERECTOMY  1985    Current Medications: Outpatient Medications Prior to Visit  Medication Sig Dispense Refill  . acarbose (PRECOSE) 25 MG tablet Take 25 mg by mouth 3 (three) times daily.      [name] acetaminophen (TYLENOL) 500 MG tablet Take 500-1,000 mg by mouth every 6 (six) hours as needed for mild pain, moderate pain or headache.    . ALPRAZolam (XANAX) 0.5 MG tablet Take 1 tablet by mouth 2 (two) times daily.    [name] apixaban (ELIQUIS) 5 MG TABS tablet Take 1 tablet (5 mg total) by mouth 2 (two) times daily. 60 tablet 5  . buPROPion (WELLBUTRIN XL) 150 MG 24 hr tablet TAKE ONE TABLET (150 MG TOTAL) BY MOUTH TWICE DAILY    . Calcium Carbonate-Vitamin D (CALCIUM + D PO) Take 600 mg by mouth 2 (two) times daily.     [name] CHERRY PO Take by mouth. Drink 1/2 cup before breakfast and 1/2 cup before supper    . furosemide (LASIX) 80 MG tablet Take 1 tablet (80 mg total) by mouth 2 (  two) times daily. 60 tablet 6  . Ginger, Zingiber officinalis, (GINGER PO) Take 1 capsule by mouth 2 (two) times daily.     [name] levothyroxine (SYNTHROID, LEVOTHROID) 88 MCG tablet Take 88 mcg by mouth daily before breakfast.    . Magnesium 250 MG TABS Take 2 tablets by mouth daily.     . meclizine (ANTIVERT) 25 MG tablet Take 1 tablet (25 mg total) by mouth 3 (three) times daily as needed for dizziness. 30 tablet 0  . Misc. Devices MISC by Does not apply route at bedtime. CPAP    . Multiple Vitamins-Minerals (PRESERVISION AREDS 2) CAPS Take 1 capsule by mouth 2 (two) times daily.    . Omega-3 Fatty Acids (FISH OIL) 1200 MG CAPS Take 1 capsule by mouth 2 (two) times daily. Take 1 at breakfast and 1 at supper    . potassium chloride SA (KLOR-CON M20) 20 MEQ tablet Take 2 tablets (40 mEq total) by mouth 2 (two) times daily. 180 tablet 3  . ranolazine (RANEXA) 500 MG 12 hr tablet Take 1 tablet (500 mg total) by mouth 2 (two) times daily. 60 tablet 6  . rOPINIRole (REQUIP) 0.5 MG tablet Take 2-3 daily at  bedtime as needed (Patient taking differently: Take 0.5-1 mg by mouth 2 (two) times daily. 0.5 mg by mouth midday then 0.5-1 mg at bedtime) 90 tablet 3  . traMADol (ULTRAM) 50 MG tablet Take 50 mg by mouth at bedtime.     [name] amiodarone (PACERONE) 400 MG tablet Take 1.5 tablets (600 mg total) by mouth 2 (two) times daily. 90 tablet 1  . carvedilol (COREG) 3.125 MG tablet Take 1 tablet (3.125 mg total) by mouth 2 (two) times daily. 60 tablet 6  . omeprazole (PRILOSEC) 40 MG capsule TAKE ONE CAPSULE BY MOUTH EVERY DAY     No facility-administered medications prior to visit.      Allergies:   Amoxicillin; Crestor [rosuvastatin]; and Lovastatin   Social History   Social History  . Marital status: Widowed    Spouse name: N/A  . Number of children: N/A  . Years of education: N/A   Social History Main Topics  . Smoking status: Never Smoker  . Smokeless tobacco: Never Used  . Alcohol use No  . Drug use: No  . Sexual activity: No   Other Topics Concern  . None   Social History Narrative  . None     Family History:  The patient's family history includes Arrhythmia in her sister; Cancer in her paternal grandfather; Diabetes in her mother; Healthy in her brother and daughter; Heart failure in her father; Hyperlipidemia in her sister and son; Hypertension in her mother, sister, and son; Stroke in her paternal grandmother.   ROS:   Please see the history of present illness.    ROS All other systems reviewed and are negative.   PHYSICAL EXAM:   VS:  BP 102/80   Pulse (!) 115   Ht 5' 5.5" (1.664 m)   Wt 192 lb (87.1 kg)   BMI 31.46 kg/m    GEN: Well nourished, well developed, in no acute distress  HEENT: normal  Neck: no JVD, carotid bruits, or masses Cardiac: irregularly irregular; no murmurs, rubs, or gallops,no edema  Respiratory:  clear to auscultation bilaterally, normal work of breathing GI: soft, nontender, nondistended, + BS MS: no deformity or atrophy  Skin: warm and dry,  no rash Neuro:  Alert and Oriented x 3, Strength and sensation are intact Psych:  euthymic mood, full affect  Wt Readings from Last 3 Encounters:  12/27/16 192 lb (87.1 kg)  12/20/16 206 lb 1.6 oz (93.5 kg)  12/04/16 228 lb 3.2 oz (103.5 kg)      Studies/Labs Reviewed:   EKG:  EKG is ordered today.  The ekg ordered today demonstrates atrial fibrillation with RVR  Recent Labs: 12/08/2016: TSH 2.153 12/13/2016: B Natriuretic Peptide 802.0 12/19/2016: ALT 121 12/20/2016: BUN 35; Creatinine, Ser 1.56; Hemoglobin 11.5; Platelets 307; Potassium 4.0; Sodium 135   Lipid Panel No results found for: CHOL, TRIG, HDL, CHOLHDL, VLDL, LDLCALC, LDLDIRECT  Additional studies/ records that were reviewed today include:   myoview 8/7//2013    Echo 02/18/2012    Echo 11/16/2016  LV EF: 35% - 40% --------------------------------------- Study Conclusions  - Left ventricle: The cavity size was mildly dilated. Wall thickness was normal. Systolic function was moderately reduced. The estimated ejection fraction was in the range of 35% to 40%. - Aortic valve: There was mild stenosis. Valve area (VTI): 0.71 cm^2. Valve area (Vmax): 0.7 cm^2. Valve area (Vmean): 0.67 cm^2. - Mitral valve: There was mild to moderate regurgitation. - Tricuspid valve: There was moderate regurgitation.   TEE 11/19/2016 LV EF: 20% - 25%  - Left ventricle: The cavity size was dilated. Systolic function was severely reduced. The estimated ejection fraction was in the range of 20% to 25%. Diffuse hypokinesis. - Aortic valve: Valve mobility was restricted. No evidence of vegetation. There was mild regurgitation. - Mitral valve: No evidence of vegetation. There was mild to moderate regurgitation. - Left atrium: The atrium was moderately dilated. No evidence of thrombus in the atrial cavity or appendage. - Right ventricle: The cavity size was mildly dilated. Systolic function was severely  reduced. - Right atrium: The atrium was moderately dilated. - Atrial septum: No defect or patent foramen ovale was identified. - Tricuspid valve: No evidence of vegetation. There was moderate regurgitation. - Pulmonic valve: No evidence of vegetation.  Impressions:  - Severe global reduction in LV function; 4 chamber enlargement; No LAA thrombus; severely reduced RV function; calcified aortic valve with reduced cusp excursion; mild AI; mild to moderate MR; moderate TR.   DCCV 12/11/2016 The patient has been on adequate anticoagulation.  The patient received IV propofol administered by anesthesia staff for sedation.  Synchronous cardioversion was performed at 120 and 150 joules.  The cardioversion was successful. The cardioversion was followed by a junctional bradycardia with ventricular rate in 40', that resolved into sinus rhythm.    DCCV 12/18/2016 Cardioverted 1 time(s).  Cardioverted at 150J.  Evaluation Findings: Post procedure EKG shows: sinus bradycardia Complications: None Patient did tolerate procedure well.  ASSESSMENT:    1. Paroxysmal atrial fibrillation with RVR ([place])   2. Dyspnea on exertion   3. Acute on chronic renal insufficiency   4. Essential hypertension   5. Hypothyroidism, unspecified type   6. Controlled type 2 diabetes mellitus without complication, without long-term current use of insulin ([place])   7. Chronic systolic heart failure ([place])   8. Hyperlipidemia, unspecified hyperlipidemia type      PLAN:  In order of problems listed above:  1. Paroxysmal atrial flutter and atrial fibrillation with RVR on eliquis:   - So far has had 3 cardioversions since February of this year. Each time, she was only able to maintain sinus rhythm for a short period before reverting back to atrial fibrillation with RVR. While he atrial fibrillation, her heart rate is very difficult to control limited by  her blood pressure.  - I eventually started loading  her on amiodarone. She went to the hospital 2 weeks ago due to volume overload related to atrial fibrillation with RVR. She underwent cardioversion on 12/11/2016, she was only able to maintain sinus rhythm for 2 days afterward. She later required cardioversion again on 12/18/2016. Her amiodarone dose has been changed to 600 mg twice a day.  - Based on today's EKG, since her recent discharge, she has went back into atrial fibrillation with RVR. She does not do very well in atrial fibrillation. So far it has likely caused her LV dysfunction and we have not been able to control her heart rate.  - I did talk to Dr. Debara Pickett today, both of [place] think further medical therapy will be unable to control her symptom. We will refer her to the electrophysiology service for consideration of ablation +/- CRT-P as previously mentioned by Dr. Caryl Comes during recent hospitalization. If we are unable to find a scheduled open for Dr. Caryl Comes, we will refer her to any electrophysiologist to see her as early as possible. She does have a follow-up scheduled on Monday with atrial fibrillation clinic, at this point I do not think medication titration at atrial fibrillation clinic will help her. We have canceled her appointment with atrial fibrillation clinic.  - Note ranolazine was added to her recent hospitalization.  2. 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  - Echocardiogram obtained on 11/16/2016 showed EF 35-40%, this is significantly decreased compared to the previous echo in May 2013  - She was in heart failure when she went to the hospital, her discharge weight was 206 pounds. Since her discharge, she has continued to lose weight. Her weight today is 192 pounds. She is essentially more than 30 pounds lighter compare to when I first saw her.  - She was previously on valsartan which I have held in order to create more blood pressure room to rate control her. With recent AKI, I will hold off on  restarting valsartan at this time. I have asked her to keep the previous valsartan bottle in anticipation of restarting it at a later time.  3. Acute on chronic renal insufficiency  - Her weight is continuing to drop. Creatinine was 1.56 on 12/20/2016. Since then she has had additional basic metabolic panel ordered at outside facility, based on record, creatinine was 1.63 on 12/23/2016. With continuous weight drop, I'm concerned about dehydration. Since she is on very high-dose of Lasix, I will repeat a complete metabolic panel today. The reason I'm doing a complete metabolic panel instead of basic metabolic panel is because her statin was discontinued during recent hospitalization due to elevated transaminase.  4. Hypertension: Blood pressure borderline, unfortunately only on 3.125 mg twice a day of carvedilol. I will increase carvedilol to 6.25 mg twice a day and instruct [facility] to contact us if her systolic blood pressure gets below 95 mmHg.  5. Hyperlipidemia: His Lipitor 40 mg daily has been discontinued history recent hospitalization due to elevated transaminase. I will be checking his liver function as part of CMP ordered today.  6. Hypothyroidism: On Synthroid    Medication Adjustments/Labs and Tests Ordered: Current medicines are reviewed at length with the patient today.  Concerns regarding medicines are outlined above.  Medication changes, Labs and Tests ordered today are listed in the Patient Instructions below. Patient Instructions  Medication Instructions:  Your physician has recommended you make the following change in your medication:  1.  INCREASE Coreg 6.25 taking 1 tablet twice a day ((CHECK PTS BLOOD PRESSURE 2 X'S DAILY.  IF THE SYSTOLIC # GETS UNDER 95 CALL OUR OFFUCE))  2.  DECREASE Amiodarone to 400 mg taking 1 tablet twice a day  Labwork: TODAY:  CMP  Testing/Procedures: NONE ORDERED  Follow-Up: Your physician recommends that you schedule a follow-up  appointment in: ASAP WITH ONE OF OUR EP (DOCTORS ONLY) AT THE [address] ([place] [address]) FOR RECURRENT AFIB FAILED ON MEDICATION THERAPY  Any Other Special Instructions Will Be Listed Below (If Applicable).   If you need a refill on your cardiac medications before your next appointment, please call your pharmacy.      Hilbert Corrigan, [place]  12/27/2016 5:36 PM    [facility] [place], [place], [place]  [address] Phone: [phone]; Fax: [phone]

## 2016-12-27 NOTE — Patient Instructions (Addendum)
Medication Instructions:  Your physician has recommended you make the following change in your medication:  1.  INCREASE Coreg 6.25 taking 1 tablet twice a day ((CHECK PTS BLOOD PRESSURE 2 X'S DAILY.  IF THE SYSTOLIC # GETS UNDER 95 CALL OUR OFFUCE))  2.  DECREASE Amiodarone to 400 mg taking 1 tablet twice a day  Labwork: TODAY:  CMP  Testing/Procedures: NONE ORDERED  Follow-Up: Your physician recommends that you schedule a follow-up appointment in: ASAP WITH ONE OF OUR EP (DOCTORS ONLY) AT THE [address] ([place] [address]) FOR RECURRENT AFIB FAILED ON MEDICATION THERAPY  Any Other Special Instructions Will Be Listed Below (If Applicable).   If you need a refill on your cardiac medications before your next appointment, please call your pharmacy.

## 2016-12-30 ENCOUNTER — Telehealth: Payer: Self-pay | Admitting: *Deleted

## 2016-12-30 NOTE — Telephone Encounter (Signed)
-----   Message from [place], [place] sent at 12/30/2016  3:39 PM EDT ----- Kidney function and electrolyte stable, liver function test shows liver enzyme has returned to normal. Recommend restart low dose lipitor at 20mg  daily and recheck LFT and fasting lipid test in 6 weeks.

## 2016-12-30 NOTE — Telephone Encounter (Signed)
No answer or voice mail pickup when dialed.

## 2016-12-31 ENCOUNTER — Ambulatory Visit (HOSPITAL_COMMUNITY): Payer: Medicare Other | Admitting: Nurse Practitioner

## 2017-01-01 ENCOUNTER — Encounter: Payer: Self-pay | Admitting: Internal Medicine

## 2017-01-01 ENCOUNTER — Other Ambulatory Visit: Payer: Self-pay | Admitting: *Deleted

## 2017-01-01 ENCOUNTER — Ambulatory Visit (INDEPENDENT_AMBULATORY_CARE_PROVIDER_SITE_OTHER): Payer: Medicare Other | Admitting: Internal Medicine

## 2017-01-01 ENCOUNTER — Encounter: Payer: Self-pay | Admitting: *Deleted

## 2017-01-01 VITALS — BP 124/72 | HR 113 | Ht 65.0 in | Wt 191.1 lb

## 2017-01-01 DIAGNOSIS — I480 Paroxysmal atrial fibrillation: Secondary | ICD-10-CM | POA: Diagnosis not present

## 2017-01-01 DIAGNOSIS — R0609 Other forms of dyspnea: Secondary | ICD-10-CM

## 2017-01-01 DIAGNOSIS — I481 Persistent atrial fibrillation: Secondary | ICD-10-CM | POA: Diagnosis not present

## 2017-01-01 DIAGNOSIS — E7849 Other hyperlipidemia: Secondary | ICD-10-CM

## 2017-01-01 DIAGNOSIS — R0600 Dyspnea, unspecified: Secondary | ICD-10-CM

## 2017-01-01 DIAGNOSIS — Z79899 Other long term (current) drug therapy: Secondary | ICD-10-CM

## 2017-01-01 DIAGNOSIS — I5022 Chronic systolic (congestive) heart failure: Secondary | ICD-10-CM | POA: Diagnosis not present

## 2017-01-01 DIAGNOSIS — I4819 Other persistent atrial fibrillation: Secondary | ICD-10-CM

## 2017-01-01 LAB — CBC WITH DIFFERENTIAL/PLATELET
Basophils Absolute: 0.1 10*3/uL (ref 0.0–0.2)
Basos: 1 %
EOS (ABSOLUTE): 0.2 10*3/uL (ref 0.0–0.4)
Eos: 2 %
Hematocrit: 43.5 % (ref 34.0–46.6)
Hemoglobin: 14.1 g/dL (ref 11.1–15.9)
Immature Grans (Abs): 0 10*3/uL (ref 0.0–0.1)
Immature Granulocytes: 0 %
Lymphocytes Absolute: 2.8 10*3/uL (ref 0.7–3.1)
Lymphs: 32 %
MCH: 25.7 pg — ABNORMAL LOW (ref 26.6–33.0)
MCHC: 32.4 g/dL (ref 31.5–35.7)
MCV: 79 fL (ref 79–97)
Monocytes Absolute: 1.2 10*3/uL — ABNORMAL HIGH (ref 0.1–0.9)
Monocytes: 13 %
Neutrophils Absolute: 4.5 10*3/uL (ref 1.4–7.0)
Neutrophils: 52 %
Platelets: 493 10*3/uL — ABNORMAL HIGH (ref 150–379)
RBC: 5.49 x10E6/uL — ABNORMAL HIGH (ref 3.77–5.28)
RDW: 17.3 % — ABNORMAL HIGH (ref 12.3–15.4)
WBC: 8.7 10*3/uL (ref 3.4–10.8)

## 2017-01-01 LAB — BASIC METABOLIC PANEL
BUN/Creatinine Ratio: 18 (ref 12–28)
BUN: 31 mg/dL — ABNORMAL HIGH (ref 8–27)
CO2: 29 mmol/L (ref 18–29)
Calcium: 9.7 mg/dL (ref 8.7–10.3)
Chloride: 97 mmol/L (ref 96–106)
Creatinine, Ser: 1.73 mg/dL — ABNORMAL HIGH (ref 0.57–1.00)
GFR calc Af Amer: 33 mL/min/{1.73_m2} — ABNORMAL LOW (ref >59)
GFR calc non Af Amer: 29 mL/min/{1.73_m2} — ABNORMAL LOW (ref >59)
Glucose: 60 mg/dL — ABNORMAL LOW (ref 65–99)
Potassium: 3.9 mmol/L (ref 3.5–5.2)
Sodium: 145 mmol/L — ABNORMAL HIGH (ref 134–144)

## 2017-01-01 NOTE — Progress Notes (Signed)
Electrophysiology Office Note   Date:  01/01/2017   ID:  Jenna [name], DOB 03/06/1941, MRN [id number]  PCP:  Chesley Noon, MD  Cardiologist:  Dr Debara Pickett    Chief Complaint  Patient presents with  . Atrial Fibrillation     History of Present Illness: Jenna [name] is a 76 y.o. female who presents today for electrophysiology evaluation.   The patient is referred by Dr Debara Pickett and Octaviano Batty for further evaluation of malignant medicine refractory atrial fibrillation.  The patient reports being diagnosed with atrial fibrillation in January but feels in retrospect that she has had afib for several years.  She has palpitations and fatigue. Recently, she has developed CHF.  This is possibly tachycardia mediated. She has had multiple recent admissions for afib with RVR.  She has been treated with amiodarone and ranexa without improvement.  She saw Dr Caryl Comes recently during a hospitalization (his consult note is reviewed) during which he discussed ablation.  She presents today for further AF management.  Today, she denies symptoms of chest pain, shortness of breath, orthopnea, PND, lower extremity edema, claudication, dizziness, presyncope, syncope, bleeding, or neurologic sequela. The patient is tolerating medications without difficulties and is otherwise without complaint today.    Past Medical History:  Diagnosis Date  . Anxiety   . Chronic lower back pain   . Chronic systolic dysfunction of left ventricle   . Depression   . Fatigue   . GERD (gastroesophageal reflux disease)   . High cholesterol   . Hypertension   . Hypothyroidism   . Melanoma of shoulder, left ([place])   . Mild aortic stenosis   . Nonischemic cardiomyopathy ([place])   . OSA on CPAP    reports compliance with CPAP  . Osteoarthritis   . Persistent atrial fibrillation with RVR ([place])    [name] 11/15/2016  . RLS (restless legs syndrome)   . Type II diabetes mellitus ([place])    Past Surgical History:  Procedure Laterality Date   . CARDIOVERSION N/A 11/19/2016   Procedure: CARDIOVERSION;  Surgeon: Lelon Perla, MD;  Location: [facility] ENDOSCOPY;  Service: Cardiovascular;  Laterality: N/A;  . CARDIOVERSION N/A 12/11/2016   Procedure: CARDIOVERSION;  Surgeon: Dorothy Spark, MD;  Location: [facility] ENDOSCOPY;  Service: Cardiovascular;  Laterality: N/A;  . CARDIOVERSION N/A 12/18/2016   Procedure: CARDIOVERSION;  Surgeon: Skeet Latch, MD;  Location: [place];  Service: Cardiovascular;  Laterality: N/A;  . CATARACT EXTRACTION W/ INTRAOCULAR LENS  IMPLANT, BILATERAL Bilateral   . DILATION AND CURETTAGE OF UTERUS    . LEXISCAN MYOVIEW  05/13/2012   No ECG changes. EKG negative for ischemia. No significant ischemia demonstrated.  [name] MELANOMA EXCISION Left ~ 09/1979   "back of my shoulder"  . TEE WITHOUT CARDIOVERSION N/A 11/19/2016   Procedure: TRANSESOPHAGEAL ECHOCARDIOGRAM (TEE);  Surgeon: Lelon Perla, MD;  Location: [facility] ENDOSCOPY;  Service: Cardiovascular;  Laterality: N/A;  need CV, but no anesthesia available  . TRANSTHORACIC ECHOCARDIOGRAM  02/18/2012   EF >55%, mild aortic stenosis  . VAGINAL HYSTERECTOMY  1985     Current Outpatient Prescriptions  Medication Sig Dispense Refill  . acarbose (PRECOSE) 25 MG tablet Take 25 mg by mouth 3 (three) times daily.      [name] acetaminophen (TYLENOL) 500 MG tablet Take 500-1,000 mg by mouth every 6 (six) hours as needed for mild pain, moderate pain or headache.    . ALPRAZolam (XANAX) 0.5 MG tablet Take 1 tablet by mouth 2 (two) times daily.    [name]  amiodarone (PACERONE) 400 MG tablet Take 1 tablet (400 mg total) by mouth 2 (two) times daily. 60 tablet 1  . apixaban (ELIQUIS) 5 MG TABS tablet Take 1 tablet (5 mg total) by mouth 2 (two) times daily. 60 tablet 5  . buPROPion (WELLBUTRIN XL) 150 MG 24 hr tablet TAKE ONE TABLET (150 MG TOTAL) BY MOUTH TWICE DAILY    . Calcium Carbonate-Vitamin D (CALCIUM + D PO) Take 600 mg by mouth 2 (two) times daily.     . carvedilol (COREG) 6.25  MG tablet Take 1 tablet (6.25 mg total) by mouth 2 (two) times daily. 60 tablet 1  . CHERRY PO Take by mouth. Drink 1/2 cup before breakfast and 1/2 cup before supper    . furosemide (LASIX) 80 MG tablet Take 1 tablet (80 mg total) by mouth 2 (two) times daily. 60 tablet 6  . Ginger, Zingiber officinalis, (GINGER PO) Take 1 capsule by mouth 2 (two) times daily.     [name] levothyroxine (SYNTHROID, LEVOTHROID) 88 MCG tablet Take 88 mcg by mouth daily before breakfast.    . Magnesium 250 MG TABS Take 2 tablets by mouth daily.     . meclizine (ANTIVERT) 25 MG tablet Take 1 tablet (25 mg total) by mouth 3 (three) times daily as needed for dizziness. 30 tablet 0  . Misc. Devices MISC by Does not apply route at bedtime. CPAP    . Multiple Vitamins-Minerals (PRESERVISION AREDS 2) CAPS Take 1 capsule by mouth 2 (two) times daily.    . Omega-3 Fatty Acids (FISH OIL) 1200 MG CAPS Take 1 capsule by mouth 2 (two) times daily. Take 1 at breakfast and 1 at supper    . potassium chloride SA (KLOR-CON M20) 20 MEQ tablet Take 2 tablets (40 mEq total) by mouth 2 (two) times daily. 180 tablet 3  . ranitidine (ZANTAC) 300 MG tablet Take 300 mg by mouth at bedtime.    . ranolazine (RANEXA) 500 MG 12 hr tablet Take 1 tablet (500 mg total) by mouth 2 (two) times daily. 60 tablet 6  . rOPINIRole (REQUIP) 0.5 MG tablet Take 2-3 daily at bedtime as needed 90 tablet 3  . senna (SENOKOT) 8.6 MG TABS tablet Take 1 tablet by mouth.    . traMADol (ULTRAM) 50 MG tablet Take 50 mg by mouth at bedtime.      No current facility-administered medications for this visit.     Allergies:   Amoxicillin; Crestor [rosuvastatin]; and Lovastatin   Social History:  The patient  reports that she has never smoked. She has never used smokeless tobacco. She reports that she does not drink alcohol or use drugs.   Family History:  The patient's  family history includes Arrhythmia in her sister; Cancer in her paternal grandfather; Diabetes in her  mother; Healthy in her brother and daughter; Heart failure in her father; Hyperlipidemia in her sister and son; Hypertension in her mother, sister, and son; Stroke in her paternal grandmother.    ROS:  Please see the history of present illness.   All other systems are personally reviewed and negative.    PHYSICAL EXAM: VS:  BP 124/72   Pulse (!) 113   Ht 5\' 5"  (1.651 m)   Wt 191 lb 2 oz (86.7 kg)   SpO2 96%   BMI 31.80 kg/m  , BMI Body mass index is 31.8 kg/m. GEN: Well nourished, well developed, in no acute distress  HEENT: normal  Neck: no JVD, carotid bruits, or  masses Cardiac: tachycardic irregular rhythm; no murmurs, rubs, or gallops,no edema  Respiratory:  clear to auscultation bilaterally, normal work of breathing GI: soft, nontender, nondistended, + BS MS: no deformity or atrophy  Skin: warm and dry  Neuro:  Strength and sensation are intact Psych: euthymic mood, full affect  EKG:  EKG is ordered today. The ekg ordered today is personally reviewed and shows afib with RVR at 113 bpm, incomplete RBBB, LAHB   Recent Labs: 12/08/2016: TSH 2.153 12/13/2016: B Natriuretic Peptide 802.0 12/20/2016: Hemoglobin 11.5; Platelets 307 12/27/2016: ALT 37; BUN 34; Creat 1.67; Potassium 4.0; Sodium 143  personally reviewed   Lipid Panel  No results found for: CHOL, TRIG, HDL, CHOLHDL, VLDL, LDLCALC, LDLDIRECT personally reviewed   Wt Readings from Last 3 Encounters:  01/01/17 191 lb 2 oz (86.7 kg)  12/27/16 192 lb (87.1 kg)  12/20/16 206 lb 1.6 oz (93.5 kg)      Other studies personally reviewed: Additional studies/ records that were reviewed today include: prior records including hospital notes, office notes, and prior TEE and TTE   Review of the above records today demonstrates: as above   ASSESSMENT AND PLAN:  1.  Persistent afib Medicine refractory and difficult to rate control She has chads2vasc score of 5.  She is appropriately anticoagulated. Therapeutic strategies  for afib including medicine and ablation were discussed in detail with the patient today. Risk, benefits, and alternatives to EP study and radiofrequency ablation for afib were also discussed in detail today. These risks include but are not limited to stroke, bleeding, vascular damage, tamponade, perforation, damage to the esophagus, lungs, and other structures, pulmonary vein stenosis, worsening renal function, and death. The patient understands these risk and wishes to proceed.  We will therefore proceed with catheter ablation at the next available time.  Will require TEE prior to ablation.  2. Nonischemic CM/ chronic systolic dysfunction Likely exacerbated by RVR.  Reassess after ablation  3. OSA Compliance with therapy encouraged  4. Hypertensive cardiovascular disease Stable No change required today   Current medicines are reviewed at length with the patient today.   The patient does not have concerns regarding her medicines.  The following changes were made today:  none    Signed, Thompson Grayer, MD  01/01/2017 9:56 AM     [facility] [address] [place] [id number] [phone] (office) [phone] (fax)

## 2017-01-01 NOTE — Patient Instructions (Addendum)
Medication Instructions:  Your physician recommends that you continue on your current medications as directed. Please refer to the Current Medication list given to you today.   Labwork: Your physician recommends that you return for lab work today: BMP/CBC   Testing/Procedures: Your physician has requested that you have a TEE. During a TEE, sound waves are used to create images of your heart. It provides your doctor with information about the size and shape of your heart and how well your heart's chambers and valves are working. In this test, a transducer is attached to the end of a flexible tube that's guided down your throat and into your esophagus (the tube leading from you mouth to your stomach) to get a more detailed image of your heart. You are not awake for the procedure. Please see the instruction sheet given to you today. For further information please visit [email or web address]    Your physician has recommended that you have an ablation. Catheter ablation is a medical procedure used to treat some cardiac arrhythmias (irregular heartbeats). During catheter ablation, a long, thin, flexible tube is put into a blood vessel in your groin (upper thigh), or neck. This tube is called an ablation catheter. It is then guided to your heart through the blood vessel. Radio frequency waves destroy small areas of heart tissue where abnormal heartbeats may cause an arrhythmia to start. Please see the instruction sheet given to you today.---01/10/17   Please arrive at [facility] at 6:30am Do not eat or drink after midnight the night prior to your procedure Do not take any medications the morning of the procedure Plan for one night stay  Will need someone to drive you home at discharge    Follow-Up:  Your physician recommends that you schedule a follow-up appointment in: 4 weeks from 01/10/17 in the afib clinic and 3 months from 01/10/17 with Dr  Rayann Heman   Any Other Special Instructions Will Be Listed Below (If Applicable).     If you need a refill on your cardiac medications before your next appointment, please call your pharmacy.

## 2017-01-03 NOTE — Telephone Encounter (Signed)
Note w instructions mailed to patient.

## 2017-01-10 ENCOUNTER — Encounter (HOSPITAL_COMMUNITY)
Admission: RE | Disposition: A | Discharge status: Skilled Nursing Facility | Payer: Self-pay | Source: Ambulatory Visit | Attending: Internal Medicine

## 2017-01-10 ENCOUNTER — Encounter (HOSPITAL_COMMUNITY): Payer: Self-pay | Admitting: *Deleted

## 2017-01-10 ENCOUNTER — Ambulatory Visit (HOSPITAL_COMMUNITY): Payer: Medicare Other | Admitting: Certified Registered Nurse Anesthetist

## 2017-01-10 ENCOUNTER — Ambulatory Visit (HOSPITAL_BASED_OUTPATIENT_CLINIC_OR_DEPARTMENT_OTHER): Payer: Medicare Other

## 2017-01-10 ENCOUNTER — Ambulatory Visit (HOSPITAL_COMMUNITY)
Admission: RE | Admit: 2017-01-10 | Discharge: 2017-01-11 | Disposition: A | Discharge status: Skilled Nursing Facility | Payer: Medicare Other | Source: Ambulatory Visit | Attending: Internal Medicine | Admitting: Internal Medicine

## 2017-01-10 DIAGNOSIS — E669 Obesity, unspecified: Secondary | ICD-10-CM | POA: Insufficient documentation

## 2017-01-10 DIAGNOSIS — N289 Disorder of kidney and ureter, unspecified: Secondary | ICD-10-CM | POA: Insufficient documentation

## 2017-01-10 DIAGNOSIS — I083 Combined rheumatic disorders of mitral, aortic and tricuspid valves: Secondary | ICD-10-CM | POA: Diagnosis not present

## 2017-01-10 DIAGNOSIS — M545 Low back pain: Secondary | ICD-10-CM | POA: Diagnosis not present

## 2017-01-10 DIAGNOSIS — Z7901 Long term (current) use of anticoagulants: Secondary | ICD-10-CM | POA: Diagnosis not present

## 2017-01-10 DIAGNOSIS — E119 Type 2 diabetes mellitus without complications: Secondary | ICD-10-CM | POA: Diagnosis not present

## 2017-01-10 DIAGNOSIS — Z79899 Other long term (current) drug therapy: Secondary | ICD-10-CM | POA: Insufficient documentation

## 2017-01-10 DIAGNOSIS — F419 Anxiety disorder, unspecified: Secondary | ICD-10-CM | POA: Diagnosis not present

## 2017-01-10 DIAGNOSIS — Z888 Allergy status to other drugs, medicaments and biological substances status: Secondary | ICD-10-CM | POA: Insufficient documentation

## 2017-01-10 DIAGNOSIS — K219 Gastro-esophageal reflux disease without esophagitis: Secondary | ICD-10-CM | POA: Insufficient documentation

## 2017-01-10 DIAGNOSIS — G2581 Restless legs syndrome: Secondary | ICD-10-CM | POA: Insufficient documentation

## 2017-01-10 DIAGNOSIS — F329 Major depressive disorder, single episode, unspecified: Secondary | ICD-10-CM | POA: Diagnosis not present

## 2017-01-10 DIAGNOSIS — G4733 Obstructive sleep apnea (adult) (pediatric): Secondary | ICD-10-CM | POA: Insufficient documentation

## 2017-01-10 DIAGNOSIS — Z6830 Body mass index (BMI) 30.0-30.9, adult: Secondary | ICD-10-CM | POA: Insufficient documentation

## 2017-01-10 DIAGNOSIS — I428 Other cardiomyopathies: Secondary | ICD-10-CM | POA: Insufficient documentation

## 2017-01-10 DIAGNOSIS — Z8249 Family history of ischemic heart disease and other diseases of the circulatory system: Secondary | ICD-10-CM | POA: Insufficient documentation

## 2017-01-10 DIAGNOSIS — Z8679 Personal history of other diseases of the circulatory system: Secondary | ICD-10-CM

## 2017-01-10 DIAGNOSIS — Z880 Allergy status to penicillin: Secondary | ICD-10-CM | POA: Diagnosis not present

## 2017-01-10 DIAGNOSIS — I4891 Unspecified atrial fibrillation: Secondary | ICD-10-CM | POA: Diagnosis present

## 2017-01-10 DIAGNOSIS — I4892 Unspecified atrial flutter: Secondary | ICD-10-CM | POA: Diagnosis not present

## 2017-01-10 DIAGNOSIS — Z9841 Cataract extraction status, right eye: Secondary | ICD-10-CM | POA: Insufficient documentation

## 2017-01-10 DIAGNOSIS — G8929 Other chronic pain: Secondary | ICD-10-CM | POA: Insufficient documentation

## 2017-01-10 DIAGNOSIS — R011 Cardiac murmur, unspecified: Secondary | ICD-10-CM | POA: Insufficient documentation

## 2017-01-10 DIAGNOSIS — Z961 Presence of intraocular lens: Secondary | ICD-10-CM | POA: Diagnosis not present

## 2017-01-10 DIAGNOSIS — Z9889 Other specified postprocedural states: Secondary | ICD-10-CM

## 2017-01-10 DIAGNOSIS — Z9842 Cataract extraction status, left eye: Secondary | ICD-10-CM | POA: Insufficient documentation

## 2017-01-10 DIAGNOSIS — Z85820 Personal history of malignant melanoma of skin: Secondary | ICD-10-CM | POA: Insufficient documentation

## 2017-01-10 DIAGNOSIS — I119 Hypertensive heart disease without heart failure: Secondary | ICD-10-CM | POA: Insufficient documentation

## 2017-01-10 DIAGNOSIS — I10 Essential (primary) hypertension: Secondary | ICD-10-CM | POA: Diagnosis present

## 2017-01-10 DIAGNOSIS — E039 Hypothyroidism, unspecified: Secondary | ICD-10-CM | POA: Diagnosis not present

## 2017-01-10 DIAGNOSIS — I481 Persistent atrial fibrillation: Secondary | ICD-10-CM | POA: Diagnosis not present

## 2017-01-10 DIAGNOSIS — M1990 Unspecified osteoarthritis, unspecified site: Secondary | ICD-10-CM | POA: Insufficient documentation

## 2017-01-10 DIAGNOSIS — Z823 Family history of stroke: Secondary | ICD-10-CM | POA: Insufficient documentation

## 2017-01-10 DIAGNOSIS — I350 Nonrheumatic aortic (valve) stenosis: Secondary | ICD-10-CM

## 2017-01-10 DIAGNOSIS — Z833 Family history of diabetes mellitus: Secondary | ICD-10-CM | POA: Insufficient documentation

## 2017-01-10 DIAGNOSIS — R5383 Other fatigue: Secondary | ICD-10-CM | POA: Insufficient documentation

## 2017-01-10 DIAGNOSIS — E7800 Pure hypercholesterolemia, unspecified: Secondary | ICD-10-CM | POA: Insufficient documentation

## 2017-01-10 DIAGNOSIS — Z809 Family history of malignant neoplasm, unspecified: Secondary | ICD-10-CM | POA: Insufficient documentation

## 2017-01-10 HISTORY — PX: ATRIAL FIBRILLATION ABLATION: EP1191

## 2017-01-10 HISTORY — DX: Personal history of other diseases of the circulatory system: Z86.79

## 2017-01-10 HISTORY — DX: Other specified postprocedural states: Z98.890

## 2017-01-10 HISTORY — PX: TEE WITHOUT CARDIOVERSION: SHX5443

## 2017-01-10 LAB — POCT I-STAT, CHEM 8
BUN: 35 mg/dL — ABNORMAL HIGH (ref 6–20)
Calcium, Ion: 1.1 mmol/L — ABNORMAL LOW (ref 1.15–1.40)
Chloride: 96 mmol/L — ABNORMAL LOW (ref 101–111)
Creatinine, Ser: 1.8 mg/dL — ABNORMAL HIGH (ref 0.44–1.00)
Glucose, Bld: 117 mg/dL — ABNORMAL HIGH (ref 65–99)
HCT: 46 % (ref 36.0–46.0)
Hemoglobin: 15.6 g/dL — ABNORMAL HIGH (ref 12.0–15.0)
Potassium: 3.7 mmol/L (ref 3.5–5.1)
Sodium: 139 mmol/L (ref 135–145)
TCO2: 33 mmol/L (ref 0–100)

## 2017-01-10 LAB — POCT ACTIVATED CLOTTING TIME
Activated Clotting Time: 329 seconds
Activated Clotting Time: 351 seconds
Activated Clotting Time: 175 seconds

## 2017-01-10 LAB — GLUCOSE, CAPILLARY: Glucose-Capillary: 85 mg/dL (ref 65–99)

## 2017-01-10 SURGERY — ECHOCARDIOGRAM, TRANSESOPHAGEAL
Anesthesia: Moderate Sedation

## 2017-01-10 SURGERY — ATRIAL FIBRILLATION ABLATION
Anesthesia: General

## 2017-01-10 MED ORDER — SODIUM CHLORIDE 0.9 % IV SOLN: INTRAVENOUS | Status: DC

## 2017-01-10 MED ORDER — MEPERIDINE HCL 25 MG/ML IJ SOLN: 6.2500 mg | INTRAMUSCULAR | Status: DC | PRN

## 2017-01-10 MED ORDER — PHENYLEPHRINE HCL 10 MG/ML IJ SOLN
INTRAMUSCULAR | Status: DC | PRN
  Administered 2017-01-10 (×5): 80 ug via INTRAVENOUS

## 2017-01-10 MED ORDER — BUPROPION HCL ER (XL) 150 MG PO TB24
150.0000 mg | ORAL_TABLET | Freq: Two times a day (BID) | ORAL | Status: DC
  Administered 2017-01-10 – 2017-01-11 (×2): 150 mg via ORAL
  Filled 2017-01-10 (×2): qty 1

## 2017-01-10 MED ORDER — BUPIVACAINE HCL (PF) 0.25 % IJ SOLN
INTRAMUSCULAR | Status: DC | PRN
  Administered 2017-01-10: 30 mL

## 2017-01-10 MED ORDER — ISOPROTERENOL HCL 0.2 MG/ML IJ SOLN
INTRAMUSCULAR | Status: AC
  Filled 2017-01-10: qty 5

## 2017-01-10 MED ORDER — MIDAZOLAM HCL 5 MG/ML IJ SOLN
INTRAMUSCULAR | Status: AC
  Filled 2017-01-10: qty 2

## 2017-01-10 MED ORDER — ONDANSETRON HCL 4 MG/2ML IJ SOLN
INTRAMUSCULAR | Status: DC | PRN
  Administered 2017-01-10: 4 mg via INTRAVENOUS

## 2017-01-10 MED ORDER — PROPOFOL 10 MG/ML IV BOLUS
INTRAVENOUS | Status: DC | PRN
  Administered 2017-01-10: 100 mg via INTRAVENOUS

## 2017-01-10 MED ORDER — HYDROCODONE-ACETAMINOPHEN 5-325 MG PO TABS: 1.0000 | ORAL_TABLET | ORAL | Status: DC | PRN

## 2017-01-10 MED ORDER — LIDOCAINE 2% (20 MG/ML) 5 ML SYRINGE
INTRAMUSCULAR | Status: DC | PRN
  Administered 2017-01-10: 100 mg via INTRAVENOUS

## 2017-01-10 MED ORDER — APIXABAN 5 MG PO TABS
5.0000 mg | ORAL_TABLET | Freq: Two times a day (BID) | ORAL | Status: DC
  Administered 2017-01-10: 5 mg via ORAL
  Filled 2017-01-10: qty 1

## 2017-01-10 MED ORDER — HEPARIN SODIUM (PORCINE) 1000 UNIT/ML IJ SOLN
INTRAMUSCULAR | Status: DC | PRN
  Administered 2017-01-10: 1000 [IU] via INTRAVENOUS

## 2017-01-10 MED ORDER — LIDOCAINE VISCOUS 2 % MT SOLN
OROMUCOSAL | Status: AC
  Filled 2017-01-10: qty 30

## 2017-01-10 MED ORDER — HEPARIN (PORCINE) IN NACL 2-0.9 UNIT/ML-% IJ SOLN
INTRAMUSCULAR | Status: DC | PRN
  Administered 2017-01-10: 1000 mL

## 2017-01-10 MED ORDER — ACETAMINOPHEN 325 MG PO TABS: 325.0000 mg | ORAL_TABLET | ORAL | Status: DC | PRN

## 2017-01-10 MED ORDER — ALPRAZOLAM 0.5 MG PO TABS
0.5000 mg | ORAL_TABLET | Freq: Two times a day (BID) | ORAL | Status: DC | PRN
  Administered 2017-01-10: 0.5 mg via ORAL
  Filled 2017-01-10: qty 1

## 2017-01-10 MED ORDER — ACETAMINOPHEN 325 MG PO TABS: 650.0000 mg | ORAL_TABLET | ORAL | Status: DC | PRN

## 2017-01-10 MED ORDER — BUPIVACAINE HCL (PF) 0.25 % IJ SOLN
INTRAMUSCULAR | Status: AC
  Filled 2017-01-10: qty 30

## 2017-01-10 MED ORDER — PROTAMINE SULFATE 10 MG/ML IV SOLN
INTRAVENOUS | Status: DC | PRN
  Administered 2017-01-10: 30 mg via INTRAVENOUS

## 2017-01-10 MED ORDER — ROPINIROLE HCL 0.5 MG PO TABS
0.5000 mg | ORAL_TABLET | Freq: Every evening | ORAL | Status: DC | PRN
  Administered 2017-01-10: 0.5 mg via ORAL
  Filled 2017-01-10: qty 1

## 2017-01-10 MED ORDER — SODIUM CHLORIDE 0.9% FLUSH
3.0000 mL | Freq: Two times a day (BID) | INTRAVENOUS | Status: DC
Start: 2017-01-10 — End: 2017-01-11
  Administered 2017-01-10: 3 mL via INTRAVENOUS

## 2017-01-10 MED ORDER — SODIUM CHLORIDE 0.9% FLUSH: 3.0000 mL | INTRAVENOUS | Status: DC | PRN

## 2017-01-10 MED ORDER — EPHEDRINE SULFATE 50 MG/ML IJ SOLN
INTRAMUSCULAR | Status: DC | PRN
  Administered 2017-01-10: 10 mg via INTRAVENOUS

## 2017-01-10 MED ORDER — HEPARIN (PORCINE) IN NACL 2-0.9 UNIT/ML-% IJ SOLN
INTRAMUSCULAR | Status: AC
  Filled 2017-01-10: qty 500

## 2017-01-10 MED ORDER — OXYCODONE HCL 5 MG/5ML PO SOLN: 5.0000 mg | Freq: Once | ORAL | Status: DC | PRN

## 2017-01-10 MED ORDER — IOPAMIDOL (ISOVUE-370) INJECTION 76%
INTRAVENOUS | Status: AC
  Filled 2017-01-10: qty 50

## 2017-01-10 MED ORDER — IOPAMIDOL (ISOVUE-370) INJECTION 76%
INTRAVENOUS | Status: DC | PRN
  Administered 2017-01-10: 3 mL via INTRAVENOUS

## 2017-01-10 MED ORDER — ONDANSETRON HCL 4 MG/2ML IJ SOLN: 4.0000 mg | Freq: Four times a day (QID) | INTRAMUSCULAR | Status: DC | PRN

## 2017-01-10 MED ORDER — LACTATED RINGERS IV SOLN
INTRAVENOUS | Status: DC | PRN
  Administered 2017-01-10: 10:00:00 via INTRAVENOUS

## 2017-01-10 MED ORDER — FENTANYL CITRATE (PF) 100 MCG/2ML IJ SOLN: 25.0000 ug | INTRAMUSCULAR | Status: DC | PRN

## 2017-01-10 MED ORDER — ISOPROTERENOL HCL 0.2 MG/ML IJ SOLN
INTRAVENOUS | Status: DC | PRN
  Administered 2017-01-10: 10 ug/min via INTRAVENOUS

## 2017-01-10 MED ORDER — SODIUM CHLORIDE 0.9 % IV SOLN
INTRAVENOUS | Status: DC
  Administered 2017-01-10: 07:00:00 via INTRAVENOUS

## 2017-01-10 MED ORDER — LEVOTHYROXINE SODIUM 88 MCG PO TABS
88.0000 ug | ORAL_TABLET | Freq: Every day | ORAL | Status: DC
  Administered 2017-01-11: 88 ug via ORAL
  Filled 2017-01-10: qty 1

## 2017-01-10 MED ORDER — OXYCODONE HCL 5 MG PO TABS: 5.0000 mg | ORAL_TABLET | Freq: Once | ORAL | Status: DC | PRN

## 2017-01-10 MED ORDER — FENTANYL CITRATE (PF) 100 MCG/2ML IJ SOLN
INTRAMUSCULAR | Status: DC | PRN
  Administered 2017-01-10 (×2): 25 ug via INTRAVENOUS

## 2017-01-10 MED ORDER — HEPARIN SODIUM (PORCINE) 1000 UNIT/ML IJ SOLN
INTRAMUSCULAR | Status: DC | PRN
  Administered 2017-01-10: 12000 [IU]

## 2017-01-10 MED ORDER — SODIUM CHLORIDE 0.9 % IV SOLN: 250.0000 mL | INTRAVENOUS | Status: DC | PRN

## 2017-01-10 MED ORDER — ONDANSETRON HCL 4 MG/2ML IJ SOLN: 4.0000 mg | Freq: Once | INTRAMUSCULAR | Status: DC | PRN

## 2017-01-10 MED ORDER — ACETAMINOPHEN 160 MG/5ML PO SOLN: 325.0000 mg | ORAL | Status: DC | PRN

## 2017-01-10 MED ORDER — TRAMADOL HCL 50 MG PO TABS
50.0000 mg | ORAL_TABLET | Freq: Two times a day (BID) | ORAL | Status: DC | PRN
  Administered 2017-01-10: 50 mg via ORAL
  Filled 2017-01-10: qty 1

## 2017-01-10 MED ORDER — FENTANYL CITRATE (PF) 100 MCG/2ML IJ SOLN
INTRAMUSCULAR | Status: DC | PRN
  Administered 2017-01-10: 25 ug via INTRAVENOUS

## 2017-01-10 MED ORDER — FENTANYL CITRATE (PF) 100 MCG/2ML IJ SOLN
INTRAMUSCULAR | Status: AC
  Filled 2017-01-10: qty 2

## 2017-01-10 MED ORDER — LIDOCAINE VISCOUS 2 % MT SOLN
OROMUCOSAL | Status: DC | PRN
  Administered 2017-01-10: 15 mL via OROMUCOSAL

## 2017-01-10 MED ORDER — APIXABAN 5 MG PO TABS
5.0000 mg | ORAL_TABLET | Freq: Two times a day (BID) | ORAL | Status: DC
  Administered 2017-01-10 – 2017-01-11 (×2): 5 mg via ORAL
  Filled 2017-01-10 (×2): qty 1

## 2017-01-10 MED ORDER — PHENYLEPHRINE HCL 10 MG/ML IJ SOLN
INTRAVENOUS | Status: DC | PRN
  Administered 2017-01-10: 25 ug/min via INTRAVENOUS

## 2017-01-10 MED ORDER — HEPARIN SODIUM (PORCINE) 1000 UNIT/ML IJ SOLN
INTRAMUSCULAR | Status: AC
  Filled 2017-01-10: qty 1

## 2017-01-10 MED ORDER — HEPARIN (PORCINE) IN NACL 2-0.9 UNIT/ML-% IJ SOLN
INTRAMUSCULAR | Status: DC | PRN
  Administered 2017-01-10: 12:00:00

## 2017-01-10 MED ORDER — MIDAZOLAM HCL 10 MG/2ML IJ SOLN
INTRAMUSCULAR | Status: DC | PRN
  Administered 2017-01-10: 1 mg via INTRAVENOUS
  Administered 2017-01-10: 2 mg via INTRAVENOUS
  Administered 2017-01-10: 1 mg via INTRAVENOUS

## 2017-01-10 SURGICAL SUPPLY — 20 items
BAG SNAP BAND KOVER 36X36 (MISCELLANEOUS) ×2 IMPLANT
BLANKET WARM UNDERBOD FULL ACC (MISCELLANEOUS) ×2 IMPLANT
CABLE SURGICAL S-101-97-12 (CABLE) ×5 IMPLANT
CATH MAPPNG PENTARAY F 2-6-2MM (CATHETERS) IMPLANT
CATH NAVISTAR SMARTTOUCH DF (ABLATOR) ×2 IMPLANT
CATH SOUNDSTAR 3D IMAGING (CATHETERS) ×1 IMPLANT
CATH WEBSTER BI DIR CS D-F CRV (CATHETERS) ×2 IMPLANT
COVER SWIFTLINK CONNECTOR (BAG) ×2 IMPLANT
NDL TRANSEP BRK 71CM 407200 (NEEDLE) IMPLANT
NEEDLE TRANSEP BRK 71CM 407200 (NEEDLE) ×2 IMPLANT
PACK EP LATEX FREE (CUSTOM PROCEDURE TRAY) ×2
PACK EP LF (CUSTOM PROCEDURE TRAY) ×1 IMPLANT
PAD DEFIB LIFELINK (PAD) ×2 IMPLANT
PATCH CARTO3 (PAD) ×1 IMPLANT
PENTARAY F 2-6-2MM (CATHETERS) ×2
SHEATH AVANTI 11F 11CM (SHEATH) ×1 IMPLANT
SHEATH PINNACLE 7F 10CM (SHEATH) ×2 IMPLANT
SHEATH PINNACLE 9F 10CM (SHEATH) ×1 IMPLANT
SHEATH SWARTZ TS SL2 63CM 8.5F (SHEATH) ×1 IMPLANT
TUBING SMART ABLATE COOLFLOW (TUBING) ×1 IMPLANT

## 2017-01-10 NOTE — CV Procedure (Signed)
TRANSESOPHAGEAL ECHOCARDIOGRAM (TEE) NOTE  INDICATIONS: atrial fibrillation, pre-ablation, aortic stenosis  PROCEDURE:   Informed consent was obtained prior to the procedure. The risks, benefits and alternatives for the procedure were discussed and the patient comprehended these risks.  Risks include, but are not limited to, cough, sore throat, vomiting, nausea, somnolence, esophageal and stomach trauma or perforation, bleeding, low blood pressure, aspiration, pneumonia, infection, trauma to the teeth and death.    After a procedural time-out, the patient was given 4 mg versed and 25 mcg fentanyl for moderate sedation.  The patient's heart rate, blood pressure, and oxygen saturation are monitored continuously during the procedure.The oropharynx was anesthetized 10 cc of topical 1% viscous lidocaine.  The transesophageal probe was inserted in the esophagus and stomach without difficulty and multiple views were obtained.  The patient was kept under observation until the patient left the procedure room.  The period of conscious sedation is 25 minutes, of which I was present face-to-face 100% of this time. The patient left the procedure room in stable condition.   Agitated microbubble saline contrast was not administered.  COMPLICATIONS:    There were no immediate complications.  Findings:  1. LEFT VENTRICLE: The left ventricular wall thickness is normal.  The left ventricular cavity is dilated in size. Wall motion is severely hypokinetic.  LVEF is 20-25%.  2. RIGHT VENTRICLE:  The right ventricle is moderately hypokinetic without any thrombus or masses.    3. LEFT ATRIUM:  The left atrium is moderately dilated in size without any thrombus or masses.  There is not spontaneous echo contrast ("smoke") in the left atrium consistent with a low flow state.  4. LEFT ATRIAL APPENDAGE:  The left atrial appendage is free of any thrombus or masses. The appendage has multiple lobes. Pulse doppler  indicates moderate flow in the appendage. Sinus rhythm is noted.  5. ATRIAL SEPTUM:  The atrial septum appears intact and is free of thrombus and/or masses.  There is no evidence for interatrial shunting by color doppler.  6. RIGHT ATRIUM:  The right atrium is dilated without any thrombus or masses.  7. MITRAL VALVE:  The mitral valve is normal in structure and function with Mild regurgitation.  There were no vegetations or stenosis.  8. AORTIC VALVE:  The aortic valve is moderately calcified and functionally bileaflet -this is well-visualized in 2D and 3D images. The left and non-coronary cusps appear fused. There is Mild regurgitation.  9. TRICUSPID VALVE:  The tricuspid valve is normal in structure and function with Mild regurgitation.  There were no vegetations or stenosis  10.  PULMONIC VALVE:  The pulmonic valve is normal in structure and function with trivial regurgitation.  There were no vegetations or stenosis.   11. AORTIC ARCH, ASCENDING AND DESCENDING AORTA:  There was grade 1 Ron Parker et. Al, 1992) atherosclerosis of the ascending aorta, aortic arch, or proximal descending aorta.  12. PULMONARY VEINS: Anomalous pulmonary venous return not noted.  13. PERICARDIUM: The pericardium appeared normal and non-thickened.  There is no pericardial effusion.  IMPRESSION:   1. No LAA thrombus (bilobar appendage) 2. Negative for PFO by color doppler 3. Mild MR/TR 4. 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 Moderate RV dysfunction 6. Severe LV dysfunction - LVEF 20-25% with global hypokinesis 7. No pericardial effusion  RECOMMENDATIONS:    1. Ok to proceed with catheter ablation today. She has not taken her Eliquis this morning. 2. Weight  yesterday was 189 (this is down from 206 at discharge recently) - creatinine has been rising, now up to 1.8. I would recommend decreasing lasix to 80 mg daily.  Time Spent Directly with  the Patient:  45 minutes   [name], MD, [facility] Attending Cardiologist [facility]  01/10/2017, 8:51 AM

## 2017-01-10 NOTE — Anesthesia Postprocedure Evaluation (Addendum)
Anesthesia Post Note  Patient: Jenna [name]  Procedure(s) Performed: Procedure(s) (LRB): Atrial Fibrillation Ablation (N/A)  Patient location during evaluation: Cath Lab Anesthesia Type: General Level of consciousness: awake Pain management: pain level controlled Vital Signs Assessment: post-procedure vital signs reviewed and stable Respiratory status: spontaneous breathing Cardiovascular status: stable Postop Assessment: no signs of nausea or vomiting Anesthetic complications: no        Last Vitals:  Vitals:   01/10/17 1345 01/10/17 1350  BP: 111/65 110/66  Pulse: 67 67  Resp: (!) 21 (!) 21  Temp:      Last Pain:  Vitals:   01/10/17 1255  TempSrc:   PainSc: 7    Pain Goal:                 Suhaas Agena JR,JOHN Taequan Stockhausen

## 2017-01-10 NOTE — H&P (View-Only) (Signed)
Electrophysiology Office Note   Date:  01/01/2017   ID:  Jenna [name], DOB 25-Mar-1941, MRN [id number]  PCP:  Chesley Noon, MD  Cardiologist:  Dr Debara Pickett    Chief Complaint  Patient presents with  . Atrial Fibrillation     History of Present Illness: Jenna [name] is a 76 y.o. female who presents today for electrophysiology evaluation.   The patient is referred by Dr Debara Pickett and Octaviano Batty for further evaluation of malignant medicine refractory atrial fibrillation.  The patient reports being diagnosed with atrial fibrillation in January but feels in retrospect that she has had afib for several years.  She has palpitations and fatigue. Recently, she has developed CHF.  This is possibly tachycardia mediated. She has had multiple recent admissions for afib with RVR.  She has been treated with amiodarone and ranexa without improvement.  She saw Dr Caryl Comes recently during a hospitalization (his consult note is reviewed) during which he discussed ablation.  She presents today for further AF management.  Today, she denies symptoms of chest pain, shortness of breath, orthopnea, PND, lower extremity edema, claudication, dizziness, presyncope, syncope, bleeding, or neurologic sequela. The patient is tolerating medications without difficulties and is otherwise without complaint today.    Past Medical History:  Diagnosis Date  . Anxiety   . Chronic lower back pain   . Chronic systolic dysfunction of left ventricle   . Depression   . Fatigue   . GERD (gastroesophageal reflux disease)   . High cholesterol   . Hypertension   . Hypothyroidism   . Melanoma of shoulder, left ([place])   . Mild aortic stenosis   . Nonischemic cardiomyopathy ([place])   . OSA on CPAP    reports compliance with CPAP  . Osteoarthritis   . Persistent atrial fibrillation with RVR ([place])    [name] 11/15/2016  . RLS (restless legs syndrome)   . Type II diabetes mellitus ([place])    Past Surgical History:  Procedure Laterality Date   . CARDIOVERSION N/A 11/19/2016   Procedure: CARDIOVERSION;  Surgeon: Lelon Perla, MD;  Location: [facility] ENDOSCOPY;  Service: Cardiovascular;  Laterality: N/A;  . CARDIOVERSION N/A 12/11/2016   Procedure: CARDIOVERSION;  Surgeon: Dorothy Spark, MD;  Location: [facility] ENDOSCOPY;  Service: Cardiovascular;  Laterality: N/A;  . CARDIOVERSION N/A 12/18/2016   Procedure: CARDIOVERSION;  Surgeon: Skeet Latch, MD;  Location: [place];  Service: Cardiovascular;  Laterality: N/A;  . CATARACT EXTRACTION W/ INTRAOCULAR LENS  IMPLANT, BILATERAL Bilateral   . DILATION AND CURETTAGE OF UTERUS    . LEXISCAN MYOVIEW  05/13/2012   No ECG changes. EKG negative for ischemia. No significant ischemia demonstrated.  [name] MELANOMA EXCISION Left ~ 09/1979   "back of my shoulder"  . TEE WITHOUT CARDIOVERSION N/A 11/19/2016   Procedure: TRANSESOPHAGEAL ECHOCARDIOGRAM (TEE);  Surgeon: Lelon Perla, MD;  Location: [facility] ENDOSCOPY;  Service: Cardiovascular;  Laterality: N/A;  need CV, but no anesthesia available  . TRANSTHORACIC ECHOCARDIOGRAM  02/18/2012   EF >55%, mild aortic stenosis  . VAGINAL HYSTERECTOMY  1985     Current Outpatient Prescriptions  Medication Sig Dispense Refill  . acarbose (PRECOSE) 25 MG tablet Take 25 mg by mouth 3 (three) times daily.      [name] acetaminophen (TYLENOL) 500 MG tablet Take 500-1,000 mg by mouth every 6 (six) hours as needed for mild pain, moderate pain or headache.    . ALPRAZolam (XANAX) 0.5 MG tablet Take 1 tablet by mouth 2 (two) times daily.    [name]  amiodarone (PACERONE) 400 MG tablet Take 1 tablet (400 mg total) by mouth 2 (two) times daily. 60 tablet 1  . apixaban (ELIQUIS) 5 MG TABS tablet Take 1 tablet (5 mg total) by mouth 2 (two) times daily. 60 tablet 5  . buPROPion (WELLBUTRIN XL) 150 MG 24 hr tablet TAKE ONE TABLET (150 MG TOTAL) BY MOUTH TWICE DAILY    . Calcium Carbonate-Vitamin D (CALCIUM + D PO) Take 600 mg by mouth 2 (two) times daily.     . carvedilol (COREG) 6.25  MG tablet Take 1 tablet (6.25 mg total) by mouth 2 (two) times daily. 60 tablet 1  . CHERRY PO Take by mouth. Drink 1/2 cup before breakfast and 1/2 cup before supper    . furosemide (LASIX) 80 MG tablet Take 1 tablet (80 mg total) by mouth 2 (two) times daily. 60 tablet 6  . Ginger, Zingiber officinalis, (GINGER PO) Take 1 capsule by mouth 2 (two) times daily.     [name] levothyroxine (SYNTHROID, LEVOTHROID) 88 MCG tablet Take 88 mcg by mouth daily before breakfast.    . Magnesium 250 MG TABS Take 2 tablets by mouth daily.     . meclizine (ANTIVERT) 25 MG tablet Take 1 tablet (25 mg total) by mouth 3 (three) times daily as needed for dizziness. 30 tablet 0  . Misc. Devices MISC by Does not apply route at bedtime. CPAP    . Multiple Vitamins-Minerals (PRESERVISION AREDS 2) CAPS Take 1 capsule by mouth 2 (two) times daily.    . Omega-3 Fatty Acids (FISH OIL) 1200 MG CAPS Take 1 capsule by mouth 2 (two) times daily. Take 1 at breakfast and 1 at supper    . potassium chloride SA (KLOR-CON M20) 20 MEQ tablet Take 2 tablets (40 mEq total) by mouth 2 (two) times daily. 180 tablet 3  . ranitidine (ZANTAC) 300 MG tablet Take 300 mg by mouth at bedtime.    . ranolazine (RANEXA) 500 MG 12 hr tablet Take 1 tablet (500 mg total) by mouth 2 (two) times daily. 60 tablet 6  . rOPINIRole (REQUIP) 0.5 MG tablet Take 2-3 daily at bedtime as needed 90 tablet 3  . senna (SENOKOT) 8.6 MG TABS tablet Take 1 tablet by mouth.    . traMADol (ULTRAM) 50 MG tablet Take 50 mg by mouth at bedtime.      No current facility-administered medications for this visit.     Allergies:   Amoxicillin; Crestor [rosuvastatin]; and Lovastatin   Social History:  The patient  reports that she has never smoked. She has never used smokeless tobacco. She reports that she does not drink alcohol or use drugs.   Family History:  The patient's  family history includes Arrhythmia in her sister; Cancer in her paternal grandfather; Diabetes in her  mother; Healthy in her brother and daughter; Heart failure in her father; Hyperlipidemia in her sister and son; Hypertension in her mother, sister, and son; Stroke in her paternal grandmother.    ROS:  Please see the history of present illness.   All other systems are personally reviewed and negative.    PHYSICAL EXAM: VS:  BP 124/72   Pulse (!) 113   Ht 5\' 5"  (1.651 m)   Wt 191 lb 2 oz (86.7 kg)   SpO2 96%   BMI 31.80 kg/m  , BMI Body mass index is 31.8 kg/m. GEN: Well nourished, well developed, in no acute distress  HEENT: normal  Neck: no JVD, carotid bruits, or  masses Cardiac: tachycardic irregular rhythm; no murmurs, rubs, or gallops,no edema  Respiratory:  clear to auscultation bilaterally, normal work of breathing GI: soft, nontender, nondistended, + BS MS: no deformity or atrophy  Skin: warm and dry  Neuro:  Strength and sensation are intact Psych: euthymic mood, full affect  EKG:  EKG is ordered today. The ekg ordered today is personally reviewed and shows afib with RVR at 113 bpm, incomplete RBBB, LAHB   Recent Labs: 12/08/2016: TSH 2.153 12/13/2016: B Natriuretic Peptide 802.0 12/20/2016: Hemoglobin 11.5; Platelets 307 12/27/2016: ALT 37; BUN 34; Creat 1.67; Potassium 4.0; Sodium 143  personally reviewed   Lipid Panel  No results found for: CHOL, TRIG, HDL, CHOLHDL, VLDL, LDLCALC, LDLDIRECT personally reviewed   Wt Readings from Last 3 Encounters:  01/01/17 191 lb 2 oz (86.7 kg)  12/27/16 192 lb (87.1 kg)  12/20/16 206 lb 1.6 oz (93.5 kg)      Other studies personally reviewed: Additional studies/ records that were reviewed today include: prior records including hospital notes, office notes, and prior TEE and TTE   Review of the above records today demonstrates: as above   ASSESSMENT AND PLAN:  1.  Persistent afib Medicine refractory and difficult to rate control She has chads2vasc score of 5.  She is appropriately anticoagulated. Therapeutic strategies  for afib including medicine and ablation were discussed in detail with the patient today. Risk, benefits, and alternatives to EP study and radiofrequency ablation for afib were also discussed in detail today. These risks include but are not limited to stroke, bleeding, vascular damage, tamponade, perforation, damage to the esophagus, lungs, and other structures, pulmonary vein stenosis, worsening renal function, and death. The patient understands these risk and wishes to proceed.  We will therefore proceed with catheter ablation at the next available time.  Will require TEE prior to ablation.  2. Nonischemic CM/ chronic systolic dysfunction Likely exacerbated by RVR.  Reassess after ablation  3. OSA Compliance with therapy encouraged  4. Hypertensive cardiovascular disease Stable No change required today   Current medicines are reviewed at length with the patient today.   The patient does not have concerns regarding her medicines.  The following changes were made today:  none    Signed, Thompson Grayer, MD  01/01/2017 9:56 AM     [facility] [address] [place] [address] [phone] (office) [phone] (fax)

## 2017-01-10 NOTE — Transfer of Care (Signed)
Immediate Anesthesia Transfer of Care Note  Patient: Jenna [name]  Procedure(s) Performed: Procedure(s): Atrial Fibrillation Ablation (N/A)  Patient Location: Cath Lab  Anesthesia Type:General  Level of Consciousness: awake, alert  and oriented  Airway & Oxygen Therapy: Patient Spontanous Breathing and Patient connected to nasal cannula oxygen  Post-op Assessment: Report given to RN and Post -op Vital signs reviewed and stable  Post vital signs: Reviewed and stable  Last Vitals:  Vitals:   01/10/17 0900 01/10/17 0910  BP: 105/70 (!) 128/94  Pulse: 95 93  Resp: 15 13  Temp:      Last Pain:  Vitals:   01/10/17 0840  TempSrc: Oral         Complications: No apparent anesthesia complications

## 2017-01-10 NOTE — H&P (Signed)
    INTERVAL PROCEDURE H&P  History and Physical Interval Note:  01/10/2017 7:46 AM  Jenna [name] has presented today for their planned procedure. The various methods of treatment have been discussed with the patient and family. After consideration of risks, benefits and other options for treatment, the patient has consented to the procedure.  The patients' outpatient history has been reviewed, patient examined, and no change in status from most recent office note within the past 30 days. I have reviewed the patients' chart and labs and will proceed as planned. Questions were answered to the patient's satisfaction.   Jenna Casino, MD, [facility] Attending Cardiologist [place] 01/10/2017, 7:46 AM

## 2017-01-10 NOTE — Interval H&P Note (Signed)
History and Physical Interval Note:  01/10/2017 7:30 AM  Jenna [name]  has presented today for surgery, with the diagnosis of afib  The various methods of treatment have been discussed with the patient and family. After consideration of risks, benefits and other options for treatment, the patient has consented to  Procedure(s): Atrial Fibrillation Ablation (N/A) as a surgical intervention .  The patient's history has been reviewed, patient examined, no change in status, stable for surgery.  I have reviewed the patient's chart and labs.  Questions were answered to the patient's satisfaction.     Thompson Grayer

## 2017-01-10 NOTE — Anesthesia Preprocedure Evaluation (Signed)
Anesthesia Evaluation  Patient identified by MRN, date of birth, ID band Patient awake    Reviewed: Allergy & Precautions, NPO status , Patient's Chart, lab work & pertinent test results  Airway Mallampati: II  TM Distance: >3 FB Neck ROM: Full    Dental  (+) Teeth Intact   Pulmonary sleep apnea and Continuous Positive Airway Pressure Ventilation ,    breath sounds clear to auscultation       Cardiovascular hypertension, Pt. on medications and Pt. on home beta blockers +CHF (- Left ventricle: The cavity size was dilated. Systolic function)  + Valvular Problems/Murmurs  Rhythm:Irregular Rate:Tachycardia     Neuro/Psych PSYCHIATRIC DISORDERS Anxiety Depression negative neurological ROS     GI/Hepatic GERD  Medicated and Controlled,  Endo/Other  diabetes, Type 2, Oral Hypoglycemic AgentsHypothyroidism   Renal/GU Renal disease     Musculoskeletal   Abdominal (+) + obese,   Peds  Hematology   Anesthesia Other Findings   Reproductive/Obstetrics                             Anesthesia Physical  Anesthesia Plan  ASA: III  Anesthesia Plan: General   Post-op Pain Management:    Induction: Intravenous  Airway Management Planned: LMA  Additional Equipment:   Intra-op Plan:   Post-operative Plan:   Informed Consent: I have reviewed the patients History and Physical, chart, labs and discussed the procedure including the risks, benefits and alternatives for the proposed anesthesia with the patient or authorized representative who has indicated his/her understanding and acceptance.   Dental advisory given  Plan Discussed with: CRNA  Anesthesia Plan Comments:         Anesthesia Quick Evaluation

## 2017-01-10 NOTE — Anesthesia Procedure Notes (Signed)
Procedure Name: LMA Insertion Date/Time: 01/10/2017 9:56 AM Performed by: Candis Shine Pre-anesthesia Checklist: Patient identified, Emergency Drugs available, Suction available and Patient being monitored Patient Re-evaluated:Patient Re-evaluated prior to inductionOxygen Delivery Method: Circle System Utilized Preoxygenation: Pre-oxygenation with 100% oxygen Intubation Type: IV induction Ventilation: Mask ventilation without difficulty LMA: LMA inserted LMA Size: 4.0 Number of attempts: 1 Airway Equipment and Method: Bite block Placement Confirmation: positive ETCO2 Tube secured with: Tape Dental Injury: Teeth and Oropharynx as per pre-operative assessment

## 2017-01-10 NOTE — Progress Notes (Addendum)
Site area: RFV x 3 Site Prior to Removal:  Level 0 Pressure Applied For:25 min Manual:  yes  Patient Status During Pull:  stable Post Pull Site:  Level 0 Post Pull Instructions Given:  yes Post Pull Pulses Present: palpable Dressing Applied:  tegaderm Bedrest begins @ 1410 till 2010 Comments:

## 2017-01-11 ENCOUNTER — Encounter (HOSPITAL_COMMUNITY): Payer: Self-pay | Admitting: Cardiology

## 2017-01-11 DIAGNOSIS — I119 Hypertensive heart disease without heart failure: Secondary | ICD-10-CM | POA: Diagnosis not present

## 2017-01-11 DIAGNOSIS — I4892 Unspecified atrial flutter: Secondary | ICD-10-CM | POA: Diagnosis not present

## 2017-01-11 DIAGNOSIS — I481 Persistent atrial fibrillation: Secondary | ICD-10-CM | POA: Diagnosis not present

## 2017-01-11 DIAGNOSIS — Z9889 Other specified postprocedural states: Secondary | ICD-10-CM

## 2017-01-11 DIAGNOSIS — I083 Combined rheumatic disorders of mitral, aortic and tricuspid valves: Secondary | ICD-10-CM | POA: Diagnosis not present

## 2017-01-11 DIAGNOSIS — Z8679 Personal history of other diseases of the circulatory system: Secondary | ICD-10-CM

## 2017-01-11 HISTORY — DX: Personal history of other diseases of the circulatory system: Z86.79

## 2017-01-11 HISTORY — DX: Other specified postprocedural states: Z98.890

## 2017-01-11 NOTE — Discharge Summary (Signed)
Discharge Summary    Patient ID: Jenna [name],  MRN: [id number], DOB/AGE: November 27, 1940 76 y.o.  Admit date: 01/10/2017 Discharge date: 01/11/2017  Primary Care Provider: Chesley Noon Primary Cardiologist:[facility]. [facility]  Discharge Diagnoses    Principal Problem:   A-fib [facility]) Active Problems:   S/P ablation of atrial flutter/ fib 01/10/17   HTN (hypertension)   Allergies Allergies  Allergen Reactions  . Amoxicillin Nausea Only  . Crestor [Rosuvastatin] Other (See Comments)    Joints ached  . Lovastatin Other (See Comments)    Joints hurt all over body     Diagnostic Studies/Procedures    01/10/17  TEE by Dr. Debara Pickett  Findings:  1. LEFT VENTRICLE: The left ventricular wall thickness is normal.  The left ventricular cavity is dilated in size. Wall motion is severely hypokinetic.  LVEF is 20-25%.  2. RIGHT VENTRICLE:  The right ventricle is moderately hypokinetic without any thrombus or masses.    3. LEFT ATRIUM:  The left atrium is moderately dilated in size without any thrombus or masses.  There is not spontaneous echo contrast ("smoke") in the left atrium consistent with a low flow state.  4. LEFT ATRIAL APPENDAGE:  The left atrial appendage is free of any thrombus or masses. The appendage has multiple lobes. Pulse doppler indicates moderate flow in the appendage. Sinus rhythm is noted.  5. ATRIAL SEPTUM:  The atrial septum appears intact and is free of thrombus and/or masses.  There is no evidence for interatrial shunting by color doppler.  6. RIGHT ATRIUM:  The right atrium is dilated without any thrombus or masses.  7. MITRAL VALVE:  The mitral valve is normal in structure and function with Mild regurgitation.  There were no vegetations or stenosis.  8. AORTIC VALVE:  The aortic valve is moderately calcified and functionally bileaflet -this is well-visualized in 2D and 3D images. The left and non-coronary cusps appear fused. There is Mild  regurgitation.  9. TRICUSPID VALVE:  The tricuspid valve is normal in structure and function with Mild regurgitation.  There were no vegetations or stenosis  10.  PULMONIC VALVE:  The pulmonic valve is normal in structure and function with trivial regurgitation.  There were no vegetations or stenosis.   11. AORTIC ARCH, ASCENDING AND DESCENDING AORTA:  There was grade 1 Ron Parker et. Al, 1992) atherosclerosis of the ascending aorta, aortic arch, or proximal descending aorta.  12. PULMONARY VEINS: Anomalous pulmonary venous return not noted.  13. PERICARDIUM: The pericardium appeared normal and non-thickened.  There is no pericardial effusion.  IMPRESSION:   1. No LAA thrombus (bilobar appendage) 2. Negative for PFO by color doppler 3. Mild MR/TR 4. 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 Moderate RV dysfunction 6. Severe LV dysfunction - LVEF 20-25% with global hypokinesis 7. No pericardial effusion  RECOMMENDATIONS:    1. Ok to proceed with catheter ablation today. She has not taken her Eliquis this morning. 2. Weight yesterday was 189 (this is down from 206 at discharge recently) - creatinine has been rising, now up to 1.8. I would recommend decreasing lasix to 80 mg daily.  _____________   Catheter ablation 01/10/17 by Dr. Rayann Heman   PROCEDURES: 1. Comprehensive electrophysiologic study. 2. Coronary sinus pacing and recording. 3. Three-dimensional mapping of atrial fibrillation with additional mapping and ablation of a second descrete arrhythmia (right atrial flutter)   4. Ablation of atrial fibrillation with additional mapping and ablation of  a second descrete arrhythmia (right atrial flutter)  5.  Intracardiac echocardiography. 6. Transseptal puncture of an intact septum. 7. Arrhythmia induction with pacing and isuprel infusecd  INTRODUCTION:  Jenna [name] is a 76 y.o. female with a history of  persistent atrial fibrillation who now presents for EP study and radiofrequency ablation. The patient reports initially being diagnosed with atrial fibrillation after presenting with symptomatic palpitations and fatgiue. The patient has failed medical therapy with amiodarone and ranexa.  The patient has a presume tachycardia mediated cardiomyopathy.  The patient therefore presents today for catheter ablation of atrial fibrillation. CONCLUSIONS: 1. Counter clockwise isthmus dependant right atrial flutter upon presentation.  2. Intracardiac echo reveals a moderate to large sized left atrium. 3. Successful ablation of atrial flutter along the cavotricuspid isthmus with complete bidirectional isthmus block achieved. 4. Successful electrical isolation and anatomical encircling of all four pulmonary veins with radiofrequency current. 5. No inducible arrhythmias following ablation both on and off of isuprel 6. No early apparent complications.  History of Present Illness       76 y.o. female admitted for EP study and catheter ablation of atrial fib, s/p successful PVI.  She has hx of persistent A fib with increased dose of amiodarone and ranolazine.  She was seen by Dr. Rayann Heman and with difficult to control rate and medicine refractory plans for ablation and TEE were made.   Hospital Course     Consultants: none   Pt underwent TEE on 01/10/17 with no LAA thrombus, EF 20-25%.  See above for complete details.  She then underwent ablation.   She did well with procedure and by the next AM was seen and examined by Dr. Lovena Le and found stable for discharge.  She was in NSR.  Plans are to stop amiodarone and ranolazine.  Continue prilosec and coreg  And continue Eliquis. She has chads2vasc score of 5.  She is appropriately anticoagulated. _____________  Discharge Vitals Blood pressure 110/68, pulse 64, temperature 97.5 F (36.4 C), temperature source Oral, resp. rate 14, height 5\' 6"  (1.676 m), weight  190 lb 14.4 oz (86.6 kg), SpO2 100 %.  Filed Weights   01/10/17 1457 01/11/17 0428  Weight: 196 lb 12.8 oz (89.3 kg) 190 lb 14.4 oz (86.6 kg)    Labs & Radiologic Studies    CBC  Recent Labs  01/10/17 0732  HGB 15.6*  HCT 77.1   Basic Metabolic Panel  Recent Labs  01/10/17 0732  NA 139  K 3.7  CL 96*  GLUCOSE 117*  BUN 35*  CREATININE 1.80*   Liver Function Tests No results for input(s): AST, ALT, ALKPHOS, BILITOT, PROT, ALBUMIN in the last 72 hours. No results for input(s): LIPASE, AMYLASE in the last 72 hours. Cardiac Enzymes No results for input(s): CKTOTAL, CKMB, CKMBINDEX, TROPONINI in the last 72 hours. BNP Invalid input(s): POCBNP D-Dimer No results for input(s): DDIMER in the last 72 hours. Hemoglobin A1C No results for input(s): HGBA1C in the last 72 hours. Fasting Lipid Panel No results for input(s): CHOL, HDL, LDLCALC, TRIG, CHOLHDL, LDLDIRECT in the last 72 hours. Thyroid Function Tests No results for input(s): TSH, T4TOTAL, T3FREE, THYROIDAB in the last 72 hours.  Invalid input(s): FREET3 _____________  US Abdomen Complete  Result Date: 12/19/2016 CLINICAL DATA:  Abnormal liver function studies, diabetes, hypertension. EXAM: ABDOMEN ULTRASOUND COMPLETE COMPARISON:  None in PACs FINDINGS: Gallbladder: No gallstones or wall thickening visualized. No sonographic Murphy sign noted by sonographer. Common bile duct: Diameter: 3.6 mm Liver:  No focal lesion identified. Within normal limits in parenchymal echogenicity. IVC: No abnormality visualized. Pancreas: Visualized portion unremarkable. Spleen: Size and appearance within normal limits. Right Kidney: Length: 11.8 cm. Echogenicity within normal limits. No mass or hydronephrosis visualized. Left Kidney: Length: 10.3 cm. Echogenicity within normal limits. No mass or hydronephrosis visualized. Abdominal aorta: No aneurysm visualized. Other findings: None. IMPRESSION: No gallstones or sonographic evidence of acute  cholecystitis. Normal appearance of the liver, spleen, and pancreas. No acute intra-abdominal abnormality is observed. Electronically Signed   By: David  Martinique M.D.   On: 12/19/2016 11:01   Disposition   Pt is being discharged home today in good condition.  Follow-up Plans & Appointments    Follow-up Information    [facility] Follow up on 02/04/2017.   Specialty:  Cardiology Why:  1:30PM Contact information: [address] [id number] mc [place] [phone]       Thompson Grayer, MD Follow up on 04/14/2017.   Specialty:  Cardiology Why:  11:00AM Contact information: [address] [place] [address] [phone]           No driving for 4 days. No lifting over 5 lbs for 1 week. No vigorous or sexual activity for 1 week. You may return to work on 01/17/17. Keep procedure site clean & dry. If you notice increased pain, swelling, bleeding or pus, call/return!  You may shower, but no soaking baths/hot tubs/pools for 1 week.     You have an appointment set up with the [facility].  Multiple studies have shown that being followed by a dedicated atrial fibrillation clinic in addition to the standard care you receive from your other physicians improves health. We believe that enrollment in the atrial fibrillation clinic will allow [place] to better care for you.   The phone number to the [facility] is [phone]. The clinic is staffed Monday through Friday from 8:30am to 5pm.  Parking Directions: The clinic is located in the Heart and Vascular Building connected to [facility]. 1)From [address] turn on to [address] and go to the 3rd entrance  (Heart and Vascular entrance) on the right. 2)Look to the right for Heart &Vascular Parking Garage. 3)A code for the entrance is required please call the clinic to receive this.   4)Take the elevators to the 1st floor.  Registration is in the room with the glass walls at the end of the hallway.  If you have any trouble parking or locating the clinic, please don't hesitate to call [phone].  Discharge Medications   Current Discharge Medication List    CONTINUE these medications which have NOT CHANGED   Details  acarbose (PRECOSE) 25 MG tablet Take 25 mg by mouth 3 (three) times daily.      acetaminophen (TYLENOL) 500 MG tablet Take 500-1,000 mg by mouth every 6 (six) hours as needed for mild pain, moderate pain or headache.    ALPRAZolam (XANAX) 0.5 MG tablet Take 1 tablet by mouth 2 (two) times daily.    apixaban (ELIQUIS) 5 MG TABS tablet Take 1 tablet (5 mg total) by mouth 2 (two) times daily. Qty: 60 tablet, Refills: 5    buPROPion (WELLBUTRIN XL) 150 MG 24 hr tablet TAKE ONE TABLET (150 MG TOTAL) BY MOUTH TWICE DAILY    Calcium Carbonate-Vitamin D (CALCIUM + D PO) Take 600 mg by mouth 2 (two) times daily.     carvedilol (COREG) 6.25 MG tablet Take  1 tablet (6.25 mg total) by mouth 2 (two) times daily. Qty: 60 tablet, Refills: 1    furosemide (LASIX) 80 MG tablet Take 1 tablet (80 mg total) by mouth 2 (two) times daily. Qty: 60 tablet, Refills: 6    Ginger, Zingiber officinalis, (GINGER PO) Take 1 capsule by mouth 2 (two) times daily.     levothyroxine (SYNTHROID, LEVOTHROID) 88 MCG tablet Take 88 mcg by mouth daily before breakfast.    Magnesium 250 MG TABS Take 2 tablets by mouth daily.     meclizine (ANTIVERT) 25 MG tablet Take 1 tablet (25 mg total) by mouth 3 (three) times daily as needed for dizziness. Qty: 30 tablet, Refills: 0    Misc. Devices MISC by Does not apply route at bedtime. CPAP    Multiple Vitamins-Minerals (PRESERVISION AREDS 2) CAPS Take 1 capsule by mouth 2 (two) times daily.    Omega-3 Fatty Acids (FISH OIL) 1200 MG CAPS Take 1 capsule by mouth 2 (two) times daily. Take 1 at breakfast and 1 at supper    potassium chloride SA (KLOR-CON M20) 20 MEQ tablet Take  2 tablets (40 mEq total) by mouth 2 (two) times daily. Qty: 180 tablet, Refills: 3    ranitidine (ZANTAC) 300 MG tablet Take 300 mg by mouth at bedtime.    rOPINIRole (REQUIP) 0.5 MG tablet Take 2-3 daily at bedtime as needed Qty: 90 tablet, Refills: 3    senna (SENOKOT) 8.6 MG TABS tablet Take 1 tablet by mouth.    traMADol (ULTRAM) 50 MG tablet Take 50 mg by mouth at bedtime.     CHERRY PO Take by mouth. Drink 1/2 cup before breakfast and 1/2 cup before supper      STOP taking these medications     amiodarone (PACERONE) 400 MG tablet      ranolazine (RANEXA) 500 MG 12 hr tablet             Outstanding Labs/Studies   none  Duration of Discharge Encounter   Greater than 30 minutes including physician time.  Signed, Cecilie Kicks NP 01/11/2017, 10:32 AM  EP Attending  Patient seen and examined. Agree with above. She is doing well after atrial fib RFA. She is stable for DC. Usual followup.  Mikle Bosworth.D.

## 2017-01-11 NOTE — Progress Notes (Signed)
Report called to Arby Barrette, Therapist, sports at [facility]. Pt's daughter at bedside getting pt dressed for d/c. Instructed to call for staff escort when ready to leave unit.

## 2017-01-11 NOTE — Progress Notes (Signed)
   Progress Note  Patient Name: Jenna [name] Date of Encounter: 01/11/2017  Primary Cardiologist: [facility]  Subjective   No chest pain or sob.   Inpatient Medications    Scheduled Meds: . apixaban  5 mg Oral BID  . buPROPion  150 mg Oral BID  . levothyroxine  88 mcg Oral QAC breakfast  . sodium chloride flush  3 mL Intravenous Q12H   Continuous Infusions:  PRN Meds: sodium chloride, acetaminophen **OR** acetaminophen (TYLENOL) oral liquid 160 mg/5 mL, ALPRAZolam, fentaNYL (SUBLIMAZE) injection, HYDROcodone-acetaminophen, ondansetron (ZOFRAN) IV, oxyCODONE **OR** oxyCODONE, rOPINIRole, sodium chloride flush, traMADol   Vital Signs    Vitals:   01/10/17 1502 01/10/17 2101 01/11/17 0428 01/11/17 0744  BP:  105/60 117/68 110/68  Pulse:   73 64  Resp:  16 16 14   Temp: 98.3 F (36.8 C) 98.2 F (36.8 C) 97.9 F (36.6 C) 97.5 F (36.4 C)  TempSrc: Oral Oral Axillary Oral  SpO2:   100% 100%  Weight:   190 lb 14.4 oz (86.6 kg)   Height:        Intake/Output Summary (Last 24 hours) at 01/11/17 0854 Last data filed at 01/11/17 0804  Gross per 24 hour  Intake              800 ml  Output              760 ml  Net               40 ml   Filed Weights   01/10/17 1457 01/11/17 0428  Weight: 196 lb 12.8 oz (89.3 kg) 190 lb 14.4 oz (86.6 kg)    Telemetry    nsr - Personally Reviewed  ECG    nsr with nonspecific T wave abnormality - Personally Reviewed  Physical Exam   GEN: No acute distress.   Neck: No JVD Cardiac: RRR, no murmurs, rubs, or gallops.  Respiratory: Clear to auscultation bilaterally. GI: Soft, nontender, non-distended  MS: No edema; No deformity. Neuro:  Nonfocal  Psych: Normal affect  Ext. - right groin without hematoma or ecchymosis  Labs    Chemistry Recent Labs Lab 01/10/17 0732  NA 139  K 3.7  CL 96*  GLUCOSE 117*  BUN 35*  CREATININE 1.80*     Hematology Recent Labs Lab 01/10/17 0732  HGB 15.6*  HCT 46.0    Cardiac EnzymesNo  results for input(s): TROPONINI in the last 168 hours. No results for input(s): TROPIPOC in the last 168 hours.   BNPNo results for input(s): BNP, PROBNP in the last 168 hours.   DDimer No results for input(s): DDIMER in the last 168 hours.   Radiology    No results found.  Cardiac Studies   none  Patient Profile     76 y.o. female admitted for EP study and catheter ablation of atrial fib, s/p successful PVI.  Assessment & Plan    1. PAF - she is maintaining NSR after ablation. She is stable for DC home and she will stop amiodarone and ranolazine. Continue prilosec and coreg as per Dr. Greggory Brandy. Usual followup. Continue anti-coagulation.   Signed, Cristopher Peru, MD  01/11/2017, 8:54 AM  Patient ID: Jenna [name], female   DOB: 07/21/1941, 76 y.o.   MRN: [id number]

## 2017-01-11 NOTE — Clinical Social Work Note (Signed)
Pt is ready for discharge today and will go to [facility]. Pt and dtr aware and agreeable to discharge plan. CSW sent clinicals to [facility] and confirmed bed. Room and report provided to RN in treatment team sticky note. Dtr providing transportation via car. CSW is signing off as no further needs identified.  Oretha Ellis, Latanya Presser, [facility] Social Worker  [phone]

## 2017-01-11 NOTE — Discharge Instructions (Signed)
No driving for 4 days. No lifting over 5 lbs for 1 week. No vigorous or sexual activity for 1 week. You may return to work on 01/17/17. Keep procedure site clean & dry. If you notice increased pain, swelling, bleeding or pus, call/return!  You may shower, but no soaking baths/hot tubs/pools for 1 week.     You have an appointment set up with the [facility].  Multiple studies have shown that being followed by a dedicated atrial fibrillation clinic in addition to the standard care you receive from your other physicians improves health. We believe that enrollment in the atrial fibrillation clinic will allow [place] to better care for you.   The phone number to the [facility] is [phone]. The clinic is staffed Monday through Friday from 8:30am to 5pm.  Parking Directions: The clinic is located in the Heart and Vascular Building connected to [facility]. 1)From [address] turn on to [address] and go to the 3rd entrance  (Heart and Vascular entrance) on the right. 2)Look to the right for Heart &Vascular Parking Garage. 3)A code for the entrance is required please call the clinic to receive this.   4)Take the elevators to the 1st floor. Registration is in the room with the glass walls at the end of the hallway.  If you have any trouble parking or locating the clinic, please dont hesitate to call [phone].

## 2017-01-12 ENCOUNTER — Encounter (HOSPITAL_COMMUNITY): Payer: Self-pay | Admitting: Internal Medicine

## 2017-01-13 ENCOUNTER — Encounter (HOSPITAL_COMMUNITY): Payer: Self-pay | Admitting: Internal Medicine

## 2017-01-31 ENCOUNTER — Telehealth: Payer: Self-pay | Admitting: Internal Medicine

## 2017-01-31 NOTE — Telephone Encounter (Signed)
LM with info Echo 11/2016: LV EF: 35% -   40%

## 2017-01-31 NOTE — Telephone Encounter (Signed)
New message    Calling to verify ejection faction and date that echo was done , leave information on the voicemail it is confidential

## 2017-02-04 ENCOUNTER — Ambulatory Visit (HOSPITAL_COMMUNITY)
Admission: RE | Admit: 2017-02-04 | Discharge: 2017-02-04 | Disposition: A | Discharge status: Home / Self Care | Payer: Medicare Other | Source: Ambulatory Visit | Attending: Nurse Practitioner | Admitting: Nurse Practitioner

## 2017-02-04 ENCOUNTER — Encounter (HOSPITAL_COMMUNITY): Payer: Self-pay | Admitting: Nurse Practitioner

## 2017-02-04 VITALS — BP 132/70 | HR 65 | Ht 66.0 in | Wt 189.2 lb

## 2017-02-04 DIAGNOSIS — I4819 Other persistent atrial fibrillation: Secondary | ICD-10-CM

## 2017-02-04 DIAGNOSIS — E039 Hypothyroidism, unspecified: Secondary | ICD-10-CM | POA: Insufficient documentation

## 2017-02-04 DIAGNOSIS — I429 Cardiomyopathy, unspecified: Secondary | ICD-10-CM | POA: Diagnosis not present

## 2017-02-04 DIAGNOSIS — F419 Anxiety disorder, unspecified: Secondary | ICD-10-CM | POA: Insufficient documentation

## 2017-02-04 DIAGNOSIS — G8929 Other chronic pain: Secondary | ICD-10-CM | POA: Diagnosis not present

## 2017-02-04 DIAGNOSIS — G2581 Restless legs syndrome: Secondary | ICD-10-CM | POA: Insufficient documentation

## 2017-02-04 DIAGNOSIS — Z85820 Personal history of malignant melanoma of skin: Secondary | ICD-10-CM | POA: Insufficient documentation

## 2017-02-04 DIAGNOSIS — Z7901 Long term (current) use of anticoagulants: Secondary | ICD-10-CM | POA: Diagnosis not present

## 2017-02-04 DIAGNOSIS — K219 Gastro-esophageal reflux disease without esophagitis: Secondary | ICD-10-CM | POA: Insufficient documentation

## 2017-02-04 DIAGNOSIS — I10 Essential (primary) hypertension: Secondary | ICD-10-CM | POA: Diagnosis not present

## 2017-02-04 DIAGNOSIS — Z9889 Other specified postprocedural states: Secondary | ICD-10-CM | POA: Insufficient documentation

## 2017-02-04 DIAGNOSIS — M1990 Unspecified osteoarthritis, unspecified site: Secondary | ICD-10-CM | POA: Diagnosis not present

## 2017-02-04 DIAGNOSIS — M545 Low back pain: Secondary | ICD-10-CM | POA: Insufficient documentation

## 2017-02-04 DIAGNOSIS — E7800 Pure hypercholesterolemia, unspecified: Secondary | ICD-10-CM | POA: Diagnosis not present

## 2017-02-04 DIAGNOSIS — G4733 Obstructive sleep apnea (adult) (pediatric): Secondary | ICD-10-CM | POA: Insufficient documentation

## 2017-02-04 DIAGNOSIS — I481 Persistent atrial fibrillation: Secondary | ICD-10-CM

## 2017-02-04 DIAGNOSIS — F329 Major depressive disorder, single episode, unspecified: Secondary | ICD-10-CM | POA: Diagnosis not present

## 2017-02-04 DIAGNOSIS — E119 Type 2 diabetes mellitus without complications: Secondary | ICD-10-CM | POA: Insufficient documentation

## 2017-02-04 DIAGNOSIS — I4892 Unspecified atrial flutter: Secondary | ICD-10-CM | POA: Diagnosis not present

## 2017-02-04 NOTE — Progress Notes (Signed)
Primary Care Physician: Chesley Noon, MD Referring Physician:Dr. 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 Rayann Heman, [facility]/u.  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 Denies any swallowing or rt groin issues. Continues on apixaban without missed doses for chadsvasc score of at least 5.  Today, she denies symptoms of palpitations, chest pain, shortness of breath, orthopnea, PND, lower extremity edema, dizziness, presyncope, syncope, or neurologic sequela. The patient is tolerating medications without difficulties and is otherwise without complaint today.   Past Medical History:  Diagnosis Date  . Anxiety   . Chronic lower back pain   . Chronic systolic dysfunction of left ventricle   . Depression   . Fatigue   . GERD (gastroesophageal reflux disease)   . High cholesterol   . Hypertension   . Hypothyroidism   . Melanoma of shoulder, left ([place])   . Mild aortic stenosis   . Nonischemic cardiomyopathy ([place])   . OSA on CPAP    reports compliance with CPAP  . Osteoarthritis   . Persistent atrial fibrillation with RVR ([place])    [name] 11/15/2016  . RLS (restless legs syndrome)   . S/P ablation of atrial flutter/ fib 01/10/17 01/11/2017  . Type II diabetes mellitus ([place])    Past Surgical History:  Procedure Laterality Date  . ATRIAL FIBRILLATION ABLATION N/A 01/10/2017   Procedure: Atrial Fibrillation Ablation;  Surgeon: Thompson Grayer, MD;  Location: [place] [facility];  Service: Cardiovascular;  Laterality: N/A;  . CARDIOVERSION N/A 11/19/2016   Procedure: CARDIOVERSION;  Surgeon: Lelon Perla, MD;  Location[facility] ENDOSCOPY;  Service: Cardiovascular;  Laterality: N/A;  . CARDIOVERSION N/A 12/11/2016   Procedure: CARDIOVERSION;  Surgeon: Dorothy Spark, MD;  Location: [facility] ENDOSCOPY;  Service: Cardiovascular;   Laterality: N/A;  . CARDIOVERSION N/A 12/18/2016   Procedure: CARDIOVERSION;  Surgeon: Skeet Latch, MD;  Location: [place];  Service: Cardiovascular;  Laterality: N/A;  . CATARACT EXTRACTION W/ INTRAOCULAR LENS  IMPLANT, BILATERAL Bilateral   . DILATION AND CURETTAGE OF UTERUS    . LEXISCAN MYOVIEW  05/13/2012   No ECG changes. EKG negative for ischemia. No significant ischemia demonstrated.  [name] MELANOMA EXCISION Left ~ 09/1979   "back of my shoulder"  . TEE WITHOUT CARDIOVERSION N/A 11/19/2016   Procedure: TRANSESOPHAGEAL ECHOCARDIOGRAM (TEE);  Surgeon: Lelon Perla, MD;  Location: [facility] ENDOSCOPY;  Service: Cardiovascular;  Laterality: N/A;  need CV, but no anesthesia available  . TEE WITHOUT CARDIOVERSION N/A 01/10/2017   Procedure: TRANSESOPHAGEAL ECHOCARDIOGRAM (TEE);  Surgeon: [name], MD;  Location: [facility] ENDOSCOPY;  Service: Cardiovascular;  Laterality: N/A;  . TRANSTHORACIC ECHOCARDIOGRAM  02/18/2012   EF >55%, mild aortic stenosis  . VAGINAL HYSTERECTOMY  1985    Current Outpatient Prescriptions  Medication Sig Dispense Refill  . acetaminophen (TYLENOL) 500 MG tablet Take 500-1,000 mg by mouth every 6 (six) hours as needed for mild pain, moderate pain or headache.    . ALPRAZolam (XANAX) 0.5 MG tablet Take 1 tablet by mouth 2 (two) times daily.    [name] apixaban (ELIQUIS) 5 MG TABS tablet Take 1 tablet (5 mg total) by mouth 2 (two) times daily. 60 tablet 5  . atorvastatin (LIPITOR) 20 MG tablet Take 20 mg by mouth daily.    [name] buPROPion (WELLBUTRIN XL) 150  MG 24 hr tablet Take 2 tablets once daily    . Calcium Carbonate-Vitamin D (CALCIUM + D PO) Take 600 mg by mouth 2 (two) times daily.     . carvedilol (COREG) 6.25 MG tablet Take 1 tablet (6.25 mg total) by mouth 2 (two) times daily. 60 tablet 1  . CHERRY PO Take by mouth. Drink 1/2 cup before breakfast and 1/2 cup before supper    . cyanocobalamin (EQL VITAMIN B-12) 500 MCG tablet Take 500 mcg by mouth daily.    .  furosemide (LASIX) 80 MG tablet Take 1 tablet (80 mg total) by mouth 2 (two) times daily. (Patient taking differently: Take 40 mg by mouth 2 (two) times daily. ) 60 tablet 6  . Ginger, Zingiber officinalis, (GINGER PO) Take 1 capsule by mouth 2 (two) times daily.     [name] levothyroxine (SYNTHROID, LEVOTHROID) 88 MCG tablet Take 88 mcg by mouth daily before breakfast.    . Magnesium 250 MG TABS Take 2 tablets by mouth daily.     . meclizine (ANTIVERT) 25 MG tablet Take 1 tablet (25 mg total) by mouth 3 (three) times daily as needed for dizziness. 30 tablet 0  . Misc. Devices MISC by Does not apply route at bedtime. CPAP    . Multiple Vitamins-Minerals (PRESERVISION AREDS 2) CAPS Take 1 capsule by mouth 2 (two) times daily.    . Omega-3 Fatty Acids (FISH OIL) 1200 MG CAPS Take 1 capsule by mouth 2 (two) times daily. Take 1 at breakfast and 1 at supper    . omeprazole (PRILOSEC) 40 MG capsule Take 40 mg by mouth daily.    . potassium chloride SA (KLOR-CON M20) 20 MEQ tablet Take 2 tablets (40 mEq total) by mouth 2 (two) times daily. 180 tablet 3  . rOPINIRole (REQUIP) 0.5 MG tablet Take 2-3 daily at bedtime as needed 90 tablet 3  . traMADol (ULTRAM) 50 MG tablet Take 50 mg by mouth at bedtime.      No current facility-administered medications for this encounter.     Allergies  Allergen Reactions  . Amoxicillin Nausea Only  . Crestor [Rosuvastatin] Other (See Comments)    Joints ached  . Lovastatin Other (See Comments)    Joints hurt all over body     Social History   Social History  . Marital status: Widowed    Spouse name: N/A  . Number of children: N/A  . Years of education: N/A   Occupational History  . Not on file.   Social History Main Topics  . Smoking status: Never Smoker  . Smokeless tobacco: Never Used  . Alcohol use No  . Drug use: No  . Sexual activity: No   Other Topics Concern  . Not on file   Social History Narrative  . No narrative on file    Family History    Problem Relation Age of Onset  . Hypertension Mother   . Diabetes Mother   . Heart failure Father   . Arrhythmia Sister   . Hyperlipidemia Sister   . Hypertension Sister   . Healthy Brother   . Stroke Paternal Grandmother   . Cancer Paternal Grandfather   . Healthy Daughter   . Hypertension Son   . Hyperlipidemia Son     ROS- All systems are reviewed and negative except as per the HPI above  Physical Exam: Vitals:   02/04/17 1324  BP: 132/70  Pulse: 65  Weight: 189 lb 3.2 oz (85.8 kg)  Height: 5\' 6"  (1.676 m)   Wt Readings from Last 3 Encounters:  02/04/17 189 lb 3.2 oz (85.8 kg)  01/11/17 190 lb 14.4 oz (86.6 kg)  01/01/17 191 lb 2 oz (86.7 kg)    Labs: Lab Results  Component Value Date   NA 139 01/10/2017   K 3.7 01/10/2017   CL 96 (L) 01/10/2017   CO2 29 01/01/2017   GLUCOSE 117 (H) 01/10/2017   BUN 35 (H) 01/10/2017   CREATININE 1.80 (H) 01/10/2017   CALCIUM 9.7 01/01/2017   Lab Results  Component Value Date   INR 2.89 12/08/2016   No results found for: CHOL, HDL, LDLCALC, TRIG   GEN- The patient is well appearing, alert and oriented x 3 today.   Head- normocephalic, atraumatic Eyes-  Sclera clear, conjunctiva pink Ears- hearing intact Oropharynx- clear Neck- supple, no JVP Lymph- no cervical lymphadenopathy Lungs- Clear to ausculation bilaterally, normal work of breathing Heart- Regular rate and rhythm, no murmurs, rubs or gallops, PMI not laterally displaced GI- soft, NT, ND, + BS Extremities- no clubbing, cyanosis, or edema MS- no significant deformity or atrophy Skin- no rash or lesion Psych- euthymic mood, full affect Neuro- strength and sensation are intact  EKG- NSR at 65 bpm, LAD, pr int 182 ms, qrs int 108 ms, qtc 434 ms Epic records reviewed    Assessment and Plan: 1. Persistent  afib s/p ablation and is doing well with no known afib burden Continue carvedilol bid Continue eliquis bid  2. NICM Weight stable No shortness of  breath to report  3. OSA Continue cpap  4. HTN Stable  f/u with Dr. Rayann Heman 7/9 Dr. Debara Pickett as scheduled afib clinic as needed  Geroge Baseman. Dakayla Disanti, [facility] [address] [place], [place] [address] [phone]

## 2017-02-11 LAB — LIPID PANEL
Cholesterol: 170 mg/dL (ref <200)
HDL: 82 mg/dL (ref >50)
LDL Cholesterol: 69 mg/dL (ref <100)
Total CHOL/HDL Ratio: 2.1 Ratio (ref <5.0)
Triglycerides: 93 mg/dL (ref <150)
VLDL: 19 mg/dL (ref <30)

## 2017-02-11 LAB — HEPATIC FUNCTION PANEL
ALT: 22 U/L (ref 6–29)
AST: 28 U/L (ref 10–35)
Albumin: 4.1 g/dL (ref 3.6–5.1)
Alkaline Phosphatase: 78 U/L (ref 33–130)
Bilirubin, Direct: 0.2 mg/dL (ref <=0.2)
Indirect Bilirubin: 0.4 mg/dL (ref 0.2–1.2)
Total Bilirubin: 0.6 mg/dL (ref 0.2–1.2)
Total Protein: 7.1 g/dL (ref 6.1–8.1)

## 2017-02-12 ENCOUNTER — Telehealth: Payer: Self-pay | Admitting: *Deleted

## 2017-02-12 NOTE — Telephone Encounter (Signed)
-----   Message from Erma Heritage, [place] sent at 02/11/2017  8:15 PM EDT ----- Covering for Isaac Laud - Please let the patient know her liver function has normalized. Cholesterol levels are within normal limits. Continue current medication regimen. Thank you!

## 2017-02-12 NOTE — Telephone Encounter (Signed)
I called patient & communicated recent lipid and hepatic test results. Pt voiced understanding and thanks.  Since being seen at [facility], she had seen Dr. Rayann Heman and had ablation 1 month ago, followed up w AFib clinic last week post-ablation.  Reports she is doing quite well since then and feels vast improvement overall.  She's asking about recommendation for next scheduled f/u w Dr. Debara Pickett or Isaac Laud. I was unable to locate any recommendations for either since the recent recommendations from [place] were for her to see EP for her A Fib issues.  Pt to f/u w Dr. Rayann Heman in July next.  For her general cardiology f/u, pt states she would be fine to see [facility] or Dr. Debara Pickett for next visit - informed her I would get recommendation on return date & return call - pt voiced thanks.

## 2017-02-13 NOTE — Telephone Encounter (Signed)
I've called patient to inform, and have scheduled her for this f/u visit in August.

## 2017-02-13 NOTE — Telephone Encounter (Signed)
Follow up we me 1 month after Dr. Rayann Heman.  Dr. Lemmie Evens

## 2017-02-20 ENCOUNTER — Other Ambulatory Visit: Payer: Self-pay | Admitting: Family Medicine

## 2017-02-20 DIAGNOSIS — Z1231 Encounter for screening mammogram for malignant neoplasm of breast: Secondary | ICD-10-CM

## 2017-02-28 ENCOUNTER — Other Ambulatory Visit: Payer: Self-pay | Admitting: *Deleted

## 2017-02-28 ENCOUNTER — Other Ambulatory Visit: Payer: Self-pay

## 2017-02-28 MED ORDER — POTASSIUM CHLORIDE CRYS ER 20 MEQ PO TBCR: 40.0000 meq | EXTENDED_RELEASE_TABLET | Freq: Two times a day (BID) | ORAL | 0 refills | Status: DC

## 2017-02-28 MED ORDER — POTASSIUM CHLORIDE CRYS ER 20 MEQ PO TBCR: 40.0000 meq | EXTENDED_RELEASE_TABLET | Freq: Two times a day (BID) | ORAL | 1 refills | Status: DC

## 2017-03-12 ENCOUNTER — Encounter (HOSPITAL_COMMUNITY): Payer: Self-pay

## 2017-03-14 NOTE — Addendum Note (Signed)
Addendum  created 03/14/17 1142 by Lyn Hollingshead, MD   Sign clinical note

## 2017-03-14 NOTE — Addendum Note (Signed)
Addendum  created 03/14/17 1037 by Lyn Hollingshead, MD   Sign clinical note

## 2017-03-27 ENCOUNTER — Encounter: Payer: Self-pay | Admitting: Internal Medicine

## 2017-04-02 ENCOUNTER — Ambulatory Visit
Admission: RE | Admit: 2017-04-02 | Discharge: 2017-04-02 | Disposition: A | Discharge status: Home / Self Care | Payer: Medicare Other | Source: Ambulatory Visit | Attending: Family Medicine | Admitting: Family Medicine

## 2017-04-02 DIAGNOSIS — Z1231 Encounter for screening mammogram for malignant neoplasm of breast: Secondary | ICD-10-CM

## 2017-04-08 ENCOUNTER — Other Ambulatory Visit: Payer: Self-pay | Admitting: Nurse Practitioner

## 2017-04-08 DIAGNOSIS — M47819 Spondylosis without myelopathy or radiculopathy, site unspecified: Secondary | ICD-10-CM

## 2017-04-10 ENCOUNTER — Telehealth: Payer: Self-pay | Admitting: Internal Medicine

## 2017-04-10 NOTE — Telephone Encounter (Signed)
[facility] requests clearance for:  1. Type of surgery: lumbar facet injection 2. Date of surgery: pending 3. Surgeon: ----------- 4. Medications that need to be held & how long: Eliquis - 2 days prior  5. Fax and/or Phone: (p) [phone]  (f) [phone]

## 2017-04-10 NOTE — Telephone Encounter (Signed)
Ok to hold Eliquis 2 days prior to injection - restart after.  Dr. Debara Pickett

## 2017-04-10 NOTE — Telephone Encounter (Signed)
Clearance routed via EPIC 

## 2017-04-14 ENCOUNTER — Ambulatory Visit (INDEPENDENT_AMBULATORY_CARE_PROVIDER_SITE_OTHER): Payer: Medicare Other | Admitting: Internal Medicine

## 2017-04-14 ENCOUNTER — Encounter: Payer: Self-pay | Admitting: Internal Medicine

## 2017-04-14 VITALS — BP 118/71 | HR 65 | Ht 66.0 in | Wt 180.6 lb

## 2017-04-14 DIAGNOSIS — I480 Paroxysmal atrial fibrillation: Secondary | ICD-10-CM | POA: Diagnosis not present

## 2017-04-14 DIAGNOSIS — G4733 Obstructive sleep apnea (adult) (pediatric): Secondary | ICD-10-CM | POA: Diagnosis not present

## 2017-04-14 DIAGNOSIS — I428 Other cardiomyopathies: Secondary | ICD-10-CM | POA: Diagnosis not present

## 2017-04-14 NOTE — Patient Instructions (Addendum)
Medication Instructions:  Your physician recommends that you continue on your current medications as directed. Please refer to the Current Medication list given to you today.  Labwork: None    Testing/Procedures: Your physician has requested that you have an echocardiogram. Echocardiography is a painless test that uses sound waves to create images of your heart. It provides your doctor with information about the size and shape of your heart and how well your heart's chambers and valves are working. This procedure takes approximately one hour. There are no restrictions for this procedure.   Follow-Up: Your physician recommends that you schedule a follow-up appointment in: Follow up with Roderic Palau in 3 months and Dr. Rayann Heman in 6 months.  Any Other Special Instructions Will Be Listed Below (If Applicable).     If you need a refill on your cardiac medications before your next appointment, please call your pharmacy.

## 2017-04-14 NOTE — Progress Notes (Signed)
PCP: Chesley Noon, MD Primary Cardiologist: Dr Josem Kaufmann is a 76 y.o. female who presents today for routine electrophysiology followup.  Since his recent afib ablation, the patient reports doing very well.  she denies procedure related complications and is pleased with the results of the procedure.  Today, she denies symptoms of palpitations, chest pain, shortness of breath,  lower extremity edema, dizziness, presyncope, or syncope.  The patient is otherwise without complaint today.   Past Medical History:  Diagnosis Date  . Anxiety   . Chronic lower back pain   . Chronic systolic dysfunction of left ventricle   . Depression   . Fatigue   . GERD (gastroesophageal reflux disease)   . High cholesterol   . Hypertension   . Hypothyroidism   . Melanoma of shoulder, left ([place])   . Mild aortic stenosis   . Nonischemic cardiomyopathy ([place])   . OSA on CPAP    reports compliance with CPAP  . Osteoarthritis   . Persistent atrial fibrillation with RVR ([place])    [name] 11/15/2016  . RLS (restless legs syndrome)   . S/P ablation of atrial flutter/ fib 01/10/17 01/11/2017  . Type II diabetes mellitus ([place])    Past Surgical History:  Procedure Laterality Date  . ATRIAL FIBRILLATION ABLATION N/A 01/10/2017   Procedure: Atrial Fibrillation Ablation;  Surgeon: Thompson Grayer, MD;  Location: [place] [facility];  Service: Cardiovascular;  Laterality: N/A;  . CARDIOVERSION N/A 11/19/2016   Procedure: CARDIOVERSION;  Surgeon: Lelon Perla, MD;  Location: [facility] ENDOSCOPY;  Service: Cardiovascular;  Laterality: N/A;  . CARDIOVERSION N/A 12/11/2016   Procedure: CARDIOVERSION;  Surgeon: Dorothy Spark, MD;  Location: [facility] ENDOSCOPY;  Service: Cardiovascular;  Laterality: N/A;  . CARDIOVERSION N/A 12/18/2016   Procedure: CARDIOVERSION;  Surgeon: Skeet Latch, MD;  Location: [place];  Service: Cardiovascular;  Laterality: N/A;  . CATARACT EXTRACTION W/ INTRAOCULAR LENS  IMPLANT, BILATERAL  Bilateral   . DILATION AND CURETTAGE OF UTERUS    . LEXISCAN MYOVIEW  05/13/2012   No ECG changes. EKG negative for ischemia. No significant ischemia demonstrated.  [name] MELANOMA EXCISION Left ~ 09/1979   "back of my shoulder"  . TEE WITHOUT CARDIOVERSION N/A 11/19/2016   Procedure: TRANSESOPHAGEAL ECHOCARDIOGRAM (TEE);  Surgeon: Lelon Perla, MD;  Location: [facility] ENDOSCOPY;  Service: Cardiovascular;  Laterality: N/A;  need CV, but no anesthesia available  . TEE WITHOUT CARDIOVERSION N/A 01/10/2017   Procedure: TRANSESOPHAGEAL ECHOCARDIOGRAM (TEE);  Surgeon: [name], MD;  Location: [facility] ENDOSCOPY;  Service: Cardiovascular;  Laterality: N/A;  . TRANSTHORACIC ECHOCARDIOGRAM  02/18/2012   EF >55%, mild aortic stenosis  . VAGINAL HYSTERECTOMY  1985    ROS- all systems are personally reviewed and negatives except as per HPI above  Current Outpatient Prescriptions  Medication Sig Dispense Refill  . acetaminophen (TYLENOL) 500 MG tablet Take 500-1,000 mg by mouth every 6 (six) hours as needed for mild pain, moderate pain or headache.    . ALPRAZolam (XANAX) 0.5 MG tablet Take 1 tablet by mouth 2 (two) times daily.    [name] apixaban (ELIQUIS) 5 MG TABS tablet Take 1 tablet (5 mg total) by mouth 2 (two) times daily. 60 tablet 5  . atorvastatin (LIPITOR) 20 MG tablet Take 20 mg by mouth daily.    [name] buPROPion (WELLBUTRIN XL) 150 MG 24 hr tablet Take 2 tablets by mouth once daily    . Calcium Carbonate-Vitamin D (CALCIUM + D PO) Take 600  mg by mouth 2 (two) times daily.     . carvedilol (COREG) 6.25 MG tablet Take 1 tablet (6.25 mg total) by mouth 2 (two) times daily. 60 tablet 1  . CHERRY PO Take by mouth. Drink 1/2 cup before breakfast and 1/2 cup before supper    . cyanocobalamin (EQL VITAMIN B-12) 500 MCG tablet Take 500 mcg by mouth daily.    . furosemide (LASIX) 80 MG tablet Take 80 mg by mouth daily.    . Ginger, Zingiber officinalis, (GINGER PO) Take 1 capsule by mouth 2 (two) times daily.     [name]  levothyroxine (SYNTHROID, LEVOTHROID) 88 MCG tablet Take 88 mcg by mouth daily before breakfast.    . Magnesium 250 MG TABS Take 2 tablets by mouth daily.     . meclizine (ANTIVERT) 25 MG tablet Take 1 tablet (25 mg total) by mouth 3 (three) times daily as needed for dizziness. 30 tablet 0  . Misc. Devices MISC by Does not apply route at bedtime. CPAP    . Multiple Vitamins-Minerals (PRESERVISION AREDS 2) CAPS Take 1 capsule by mouth 2 (two) times daily.    . Omega-3 Fatty Acids (FISH OIL) 1200 MG CAPS Take 1 capsule by mouth 2 (two) times daily. Take 1 at breakfast and 1 at supper    . omeprazole (PRILOSEC) 40 MG capsule Take 40 mg by mouth daily.    . potassium chloride SA (KLOR-CON M20) 20 MEQ tablet Take 2 tablets (40 mEq total) by mouth 2 (two) times daily. 360 tablet 1  . rOPINIRole (REQUIP) 0.5 MG tablet Take 2-3 daily at bedtime as needed 90 tablet 3  . traMADol (ULTRAM) 50 MG tablet Take 50 mg by mouth at bedtime.      No current facility-administered medications for this visit.     Physical Exam: Vitals:   04/14/17 1123  BP: 118/71  Pulse: 65  SpO2: 98%  Weight: 180 lb 9.6 oz (81.9 kg)  Height: 5\' 6"  (1.676 m)    GEN- The patient is well appearing, alert and oriented x 3 today.   Head- normocephalic, atraumatic Eyes-  Sclera clear, conjunctiva pink Ears- hearing intact Oropharynx- clear Lungs- Clear to ausculation bilaterally, normal work of breathing Heart- Regular rate and rhythm, no murmurs, rubs or gallops, PMI not laterally displaced GI- soft, NT, ND, + BS Extremities- no clubbing, cyanosis, or edema  EKG tracing ordered today is personally reviewed and shows sinus rhythm 65 bpm, nonspecific ST/T changes  Assessment and Plan:  1. Persistent/ paroxysmal atrial fibrillation Doing well s/p ablation chads2vasc score is 5.  Continue long term anticoagulation  2. Nonischemic CM Repeat echo in 3 months to assess EF in sinus rhythm  3. OSA compliance with CPAP  encouraged  4. Hypertensive cardiovascular disease Stable No change required today  Return to see me in 3 months  Thompson Grayer MD, [facility] 04/14/2017 11:34 AM

## 2017-04-18 ENCOUNTER — Other Ambulatory Visit: Payer: Self-pay | Admitting: Nurse Practitioner

## 2017-04-18 ENCOUNTER — Ambulatory Visit
Admission: RE | Admit: 2017-04-18 | Discharge: 2017-04-18 | Disposition: A | Discharge status: Home / Self Care | Payer: Medicare Other | Source: Ambulatory Visit | Attending: Nurse Practitioner | Admitting: Nurse Practitioner

## 2017-04-18 DIAGNOSIS — M47819 Spondylosis without myelopathy or radiculopathy, site unspecified: Secondary | ICD-10-CM

## 2017-04-18 MED ORDER — METHYLPREDNISOLONE ACETATE 40 MG/ML INJ SUSP (RADIOLOG
120.0000 mg | Freq: Once | INTRAMUSCULAR | Status: AC
  Administered 2017-04-18: 120 mg via INTRA_ARTICULAR

## 2017-04-18 MED ORDER — IOPAMIDOL (ISOVUE-M 200) INJECTION 41%
1.0000 mL | Freq: Once | INTRAMUSCULAR | Status: AC
  Administered 2017-04-18: 1 mL via INTRA_ARTICULAR

## 2017-04-18 NOTE — Discharge Instructions (Signed)

## 2017-04-22 ENCOUNTER — Ambulatory Visit (HOSPITAL_COMMUNITY): Payer: Medicare Other | Attending: Cardiology

## 2017-04-22 ENCOUNTER — Other Ambulatory Visit: Payer: Self-pay

## 2017-04-22 ENCOUNTER — Telehealth: Payer: Self-pay | Admitting: Internal Medicine

## 2017-04-22 DIAGNOSIS — I10 Essential (primary) hypertension: Secondary | ICD-10-CM | POA: Insufficient documentation

## 2017-04-22 DIAGNOSIS — E039 Hypothyroidism, unspecified: Secondary | ICD-10-CM | POA: Diagnosis not present

## 2017-04-22 DIAGNOSIS — G4733 Obstructive sleep apnea (adult) (pediatric): Secondary | ICD-10-CM | POA: Diagnosis not present

## 2017-04-22 DIAGNOSIS — F419 Anxiety disorder, unspecified: Secondary | ICD-10-CM | POA: Insufficient documentation

## 2017-04-22 DIAGNOSIS — I350 Nonrheumatic aortic (valve) stenosis: Secondary | ICD-10-CM | POA: Insufficient documentation

## 2017-04-22 DIAGNOSIS — I480 Paroxysmal atrial fibrillation: Secondary | ICD-10-CM | POA: Diagnosis not present

## 2017-04-22 DIAGNOSIS — I428 Other cardiomyopathies: Secondary | ICD-10-CM | POA: Insufficient documentation

## 2017-04-22 DIAGNOSIS — E785 Hyperlipidemia, unspecified: Secondary | ICD-10-CM | POA: Insufficient documentation

## 2017-04-22 NOTE — Telephone Encounter (Signed)
Echo done today is not read yet. From 11-2016:The estimated ejection fraction was in the range of 35% to 40%.  Left detailed message that ECHO from today was not read and to call back in a few days for final reading

## 2017-04-22 NOTE — Telephone Encounter (Signed)
New Message    Needs Ejection fraction from feb. Echo  Also needs bp most recent    Leave message on vm, it is secure

## 2017-04-30 ENCOUNTER — Other Ambulatory Visit: Payer: Self-pay | Admitting: Nurse Practitioner

## 2017-04-30 DIAGNOSIS — M47819 Spondylosis without myelopathy or radiculopathy, site unspecified: Secondary | ICD-10-CM

## 2017-05-06 ENCOUNTER — Telehealth: Payer: Self-pay | Admitting: Internal Medicine

## 2017-05-06 NOTE — Telephone Encounter (Signed)
Jenna [name] Is returning a Call .[name] Thanks

## 2017-05-06 NOTE — Telephone Encounter (Signed)
Reviewed results of echo with patient who verbalized understanding. She was grateful for the call.

## 2017-05-13 ENCOUNTER — Ambulatory Visit (INDEPENDENT_AMBULATORY_CARE_PROVIDER_SITE_OTHER): Payer: Medicare Other | Admitting: Internal Medicine

## 2017-05-13 VITALS — BP 118/70 | HR 65 | Ht 66.0 in | Wt 186.6 lb

## 2017-05-13 DIAGNOSIS — I428 Other cardiomyopathies: Secondary | ICD-10-CM | POA: Diagnosis not present

## 2017-05-13 DIAGNOSIS — I10 Essential (primary) hypertension: Secondary | ICD-10-CM

## 2017-05-13 DIAGNOSIS — Z8679 Personal history of other diseases of the circulatory system: Secondary | ICD-10-CM

## 2017-05-13 DIAGNOSIS — I480 Paroxysmal atrial fibrillation: Secondary | ICD-10-CM | POA: Diagnosis not present

## 2017-05-13 DIAGNOSIS — Z9889 Other specified postprocedural states: Secondary | ICD-10-CM

## 2017-05-13 NOTE — Patient Instructions (Addendum)
Your physician has recommended you make the following change in your medication: -- DECREASE lasix to 40mg  once daily  Your physician wants you to follow-up in: 1 year with Dr. Debara Pickett. You will receive a reminder letter in the mail two months in advance. If you don't receive a letter, please call our office to schedule the follow-up appointment.

## 2017-05-14 DIAGNOSIS — I428 Other cardiomyopathies: Secondary | ICD-10-CM | POA: Insufficient documentation

## 2017-05-14 NOTE — Progress Notes (Signed)
OFFICE NOTE  Chief Complaint:  Hospital follow-up  Primary Care Physician: Chesley Noon, MD  HPI:  JOSEE SPEECE is a 76 year old female with a history of mild aortic stenosis and a negative nuclear stress test in August 2013. Recently she had an episode of palpitations which she felt lasted up to about 10 minutes. During that time she felt a decrease in exercise tolerance with fatigue and no energy. This was concerning for AFib; however, she wore a monitor for a short period of time and it did not show that.  I set her up for a 30-day monitor, which she worse very faithfully between February 26 and December 31, 2012. The results indicated 1 very brief episode of AFib of less than 10 seconds in duration out of a total of over 40,000 seconds of monitoring. This correlates with a burden of less than 0.003%. At this point, given her low burden I feel that it is still reasonable to treat her with low-dose aspirin. However, we will continue to see her closely and adjust her anticoagulation as necessary. Since her last follow-up she denies any significant palpitations. She has reported some LE swelling, which she thinks is better now that she is off of her ARB?  Mrs. Amy returns today for followup. She reports doing fairly well. In August she said she had an episode of age of fibrillation when coming home from the beach. That day she walked up and down the stairs several times and felt her heart racing. It never seemed to slow down. Finally she felt better the next day however she was fatigued and eventually recovered. Since that time she's had no further events. She does get some mild swelling in her legs. She is reporting some fatigue since starting a beta blocker and feels that it may be a side effect.  I saw Mrs. Negrette back today in the office. She seems to doing fairly well from a cardiac standpoint. She denies any significant palpitations and thinks that the diltiazem is generally controlled  them. If she is having A. fib is very infrequent and a low burden. She is maintained only on aspirin. Unfortunate she's been struggling with depression which occurred after grieving for the death of her husband. She's been apparently on several different antidepressive medicines with mixed results and recently on Wellbutrin which she thinks is somewhat helpful. She did have somewhat of a flat affect in the office today.  07/08/2016  Mrs. Rainone returns today for follow-up. She is doing generally well and denies any palpitations of significance. EKG today shows sinus rhythm at 72. Her blood pressure is mildly elevated 141/79. Weight is up a few pounds. She continues to struggle with depression related to the death of her husband which is now a couple of years ago.  05/13/2017  Jolene returns today for follow-up. Recently she was hospitalized in March with A. fib with RVR and symptoms of heart failure. Her diuretics were increased and she underwent cardioversion. This was initially successful, however ultimately she went back into A. fib. She was seen by EP and was placed on amiodarone and Ranexa. She was later evaluated and deemed a candidate for ablation. I performed a cardiac transesophageal echocardiogram which demonstrated an LVEF of 20-25% and no evidence of LV thrombus. Subsequently she underwent catheter ablation with successful conversion to sinus rhythm. Since that time she is maintaining sinus rhythm and a repeat echocardiogram on 04/22/2017 demonstrates normalization of LV function to an LVEF of 60-65%.  She reports that she's been feeling well. She's been worked with her primary care provider who recently obtained labs and indicated she may be a little dehydrated. She was on high-dose Lasix 80 mg daily daily which was reduced to 40 mg alternating with 80 mg every other day. She denies any worsening shortness of breath or chest pain. Blood pressure is at goal today. Her weight is been stable. She is  compliant with Eliquis for anticoagulation and denies any bleeding problems.  PMHx:  Past Medical History:  Diagnosis Date  . Anxiety   . Chronic lower back pain   . Chronic systolic dysfunction of left ventricle   . Depression   . Fatigue   . GERD (gastroesophageal reflux disease)   . High cholesterol   . Hypertension   . Hypothyroidism   . Melanoma of shoulder, left ([place])   . Mild aortic stenosis   . Nonischemic cardiomyopathy ([place])   . OSA on CPAP    reports compliance with CPAP  . Osteoarthritis   . Persistent atrial fibrillation with RVR ([place])    [name] 11/15/2016  . RLS (restless legs syndrome)   . S/P ablation of atrial flutter/ fib 01/10/17 01/11/2017  . Type II diabetes mellitus ([place])     Past Surgical History:  Procedure Laterality Date  . ATRIAL FIBRILLATION ABLATION N/A 01/10/2017   Procedure: Atrial Fibrillation Ablation;  Surgeon: Thompson Grayer, MD;  Location: [place] [facility];  Service: Cardiovascular;  Laterality: N/A;  . CARDIOVERSION N/A 11/19/2016   Procedure: CARDIOVERSION;  Surgeon: Lelon Perla, MD;  Location: [facility] ENDOSCOPY;  Service: Cardiovascular;  Laterality: N/A;  . CARDIOVERSION N/A 12/11/2016   Procedure: CARDIOVERSION;  Surgeon: Dorothy Spark, MD;  Location: [facility] ENDOSCOPY;  Service: Cardiovascular;  Laterality: N/A;  . CARDIOVERSION N/A 12/18/2016   Procedure: CARDIOVERSION;  Surgeon: Skeet Latch, MD;  Location: [place];  Service: Cardiovascular;  Laterality: N/A;  . CATARACT EXTRACTION W/ INTRAOCULAR LENS  IMPLANT, BILATERAL Bilateral   . DILATION AND CURETTAGE OF UTERUS    . LEXISCAN MYOVIEW  05/13/2012   No ECG changes. EKG negative for ischemia. No significant ischemia demonstrated.  [name] MELANOMA EXCISION Left ~ 09/1979   "back of my shoulder"  . TEE WITHOUT CARDIOVERSION N/A 11/19/2016   Procedure: TRANSESOPHAGEAL ECHOCARDIOGRAM (TEE);  Surgeon: Lelon Perla, MD;  Location: [facility] ENDOSCOPY;  Service: Cardiovascular;  Laterality: N/A;   need CV, but no anesthesia available  . TEE WITHOUT CARDIOVERSION N/A 01/10/2017   Procedure: TRANSESOPHAGEAL ECHOCARDIOGRAM (TEE);  Surgeon: [name], MD;  Location: [facility] ENDOSCOPY;  Service: Cardiovascular;  Laterality: N/A;  . TRANSTHORACIC ECHOCARDIOGRAM  02/18/2012   EF >55%, mild aortic stenosis  . VAGINAL HYSTERECTOMY  1985    FAMHx:  Family History  Problem Relation Age of Onset  . Hypertension Mother   . Diabetes Mother   . Heart failure Father   . Arrhythmia Sister   . Hyperlipidemia Sister   . Hypertension Sister   . Healthy Brother   . Stroke Paternal Grandmother   . Cancer Paternal Grandfather   . Healthy Daughter   . Hypertension Son   . Hyperlipidemia Son     SOCHx:   reports that she has never smoked. She has never used smokeless tobacco. She reports that she uses drugs, including Cocaine. She reports that she does not drink alcohol.  ALLERGIES:  Allergies  Allergen Reactions  . Amoxicillin Nausea Only  . Crestor [Rosuvastatin] Other (See Comments)    Joints ached  .  Lovastatin Other (See Comments)    Joints hurt all over body     ROS: Pertinent items noted in HPI and remainder of comprehensive ROS otherwise negative.  HOME MEDS: Current Outpatient Prescriptions  Medication Sig Dispense Refill  . acetaminophen (TYLENOL) 500 MG tablet Take 500-1,000 mg by mouth every 6 (six) hours as needed for mild pain, moderate pain or headache.    . ALPRAZolam (XANAX) 0.5 MG tablet Take 1 tablet by mouth 2 (two) times daily.    [name] apixaban (ELIQUIS) 5 MG TABS tablet Take 1 tablet (5 mg total) by mouth 2 (two) times daily. 60 tablet 5  . atorvastatin (LIPITOR) 20 MG tablet Take 20 mg by mouth daily.    [name] buPROPion (WELLBUTRIN XL) 150 MG 24 hr tablet Take 2 tablets by mouth once daily    . Calcium Carbonate-Vitamin D (CALCIUM + D PO) Take 600 mg by mouth 2 (two) times daily.     . carvedilol (COREG) 6.25 MG tablet Take 1 tablet (6.25 mg total) by mouth 2 (two) times  daily. 60 tablet 1  . CHERRY PO Take by mouth. Drink 1/2 cup before breakfast and 1/2 cup before supper    . cyanocobalamin (EQL VITAMIN B-12) 500 MCG tablet Take 500 mcg by mouth daily.    . furosemide (LASIX) 80 MG tablet Take 40 mg by mouth daily.     . Ginger, Zingiber officinalis, (GINGER PO) Take 1 capsule by mouth 2 (two) times daily.     [name] levothyroxine (SYNTHROID, LEVOTHROID) 88 MCG tablet Take 88 mcg by mouth daily before breakfast.    . Magnesium 250 MG TABS Take 2 tablets by mouth daily.     . meclizine (ANTIVERT) 25 MG tablet Take 1 tablet (25 mg total) by mouth 3 (three) times daily as needed for dizziness. 30 tablet 0  . Misc. Devices MISC by Does not apply route at bedtime. CPAP    . Multiple Vitamins-Minerals (PRESERVISION AREDS 2) CAPS Take 1 capsule by mouth 2 (two) times daily.    . Omega-3 Fatty Acids (FISH OIL) 1200 MG CAPS Take 1 capsule by mouth 2 (two) times daily. Take 1 at breakfast and 1 at supper    . omeprazole (PRILOSEC) 40 MG capsule Take 40 mg by mouth daily.    . potassium chloride SA (KLOR-CON M20) 20 MEQ tablet Take 2 tablets (40 mEq total) by mouth 2 (two) times daily. 360 tablet 1  . pyridOXINE (B-6) 50 MG tablet Take 50 mg by mouth daily.    [name] rOPINIRole (REQUIP) 0.5 MG tablet Take 2-3 daily at bedtime as needed 90 tablet 3  . traMADol (ULTRAM) 50 MG tablet Take 50 mg by mouth at bedtime.      No current facility-administered medications for this visit.     LABS/IMAGING: No results found for this or any previous visit (from the past 48 hour(s)). No results found.  VITALS: BP 118/70   Pulse 65   Ht 5\' 6"  (1.676 m)   Wt 186 lb 9.6 oz (84.6 kg)   BMI 30.12 kg/m   EXAM: General appearance: alert and no distress Neck: no carotid bruit, no JVD and thyroid not enlarged, symmetric, no tenderness/mass/nodules Lungs: clear to auscultation bilaterally Heart: regular rate and rhythm, S1, S2 normal and systolic murmur: early systolic 2/6, crescendo at 2nd  right intercostal space Abdomen: soft, non-tender; bowel sounds normal; no masses,  no organomegaly Extremities: extremities normal, atraumatic, no cyanosis or edema Pulses: 2+ and symmetric Skin:  Skin color, texture, turgor normal. No rashes or lesions Neurologic: Grossly normal Psych: Pleasant  EKG: Normal sinus rhythm at 65-personally reviewed  ASSESSMENT: 1. Paroxysmal atrial fibrillation, s/p catheter ablation (01/2017) - [id number] on Eliquis 2. 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 HTN - controlled 4. Dyslipidemia - not on a statin, intolerant to Crestor and lovastatin 5. Mild aortic stenosis - unchanged by exam 6. History of depression  PLAN: 1.  Mrs. Zelenak is doing significantly better from a cardiac standpoint after her ablation. Her LVEF is normalized. She is on appropriate medical therapy for heart failure including carvedilol and Lasix. I think that her exam indicates euvolemic status today and will decrease her Lasix to 40 mg daily. She reports follow-up with her primary care physician within a few weeks. She has follow-up with Dr. Rayann Heman in 6 months and can follow-up with me in 6 months after that.  [name], MD, [facility] Attending Cardiologist [place] 05/14/2017, 7:31 AM

## 2017-06-05 ENCOUNTER — Telehealth: Payer: Self-pay | Admitting: Pharmacist

## 2017-06-05 NOTE — Telephone Encounter (Signed)
Letter sent to [facility] [name] [facility] at [phone]

## 2017-06-24 ENCOUNTER — Ambulatory Visit
Admission: RE | Admit: 2017-06-24 | Discharge: 2017-06-24 | Disposition: A | Discharge status: Home / Self Care | Payer: Medicare Other | Source: Ambulatory Visit | Attending: Nurse Practitioner | Admitting: Nurse Practitioner

## 2017-06-24 DIAGNOSIS — M47819 Spondylosis without myelopathy or radiculopathy, site unspecified: Secondary | ICD-10-CM

## 2017-06-24 MED ORDER — IOPAMIDOL (ISOVUE-M 200) INJECTION 41%
1.0000 mL | Freq: Once | INTRAMUSCULAR | Status: AC
  Administered 2017-06-24: 1 mL via INTRA_ARTICULAR

## 2017-06-24 MED ORDER — METHYLPREDNISOLONE ACETATE 40 MG/ML INJ SUSP (RADIOLOG
120.0000 mg | Freq: Once | INTRAMUSCULAR | Status: AC
  Administered 2017-06-24: 120 mg via INTRA_ARTICULAR

## 2017-06-24 NOTE — Discharge Instructions (Signed)

## 2017-07-08 ENCOUNTER — Telehealth: Payer: Self-pay | Admitting: Internal Medicine

## 2017-07-08 NOTE — Telephone Encounter (Signed)
   [facility] Pre-operative Risk Assessment    Request for surgical clearance:  1. What type of surgery is being performed? peridontal scaling: root planning (four or more teeth per quadrant)  2. When is this surgery scheduled? TBS   3. Are there any medications that need to be held prior to surgery and how long? Eliquis   4. Name of physician performing surgery? Mackler, Lutins, Benitez DDS(s)   5. What is your office phone and fax number? (p) [phone]  (f) [phone]   6. Anesthesia type (None, local, MAC, general) ? None specified    Jenna [name] 07/08/2017, 2:04 PM  _________________________________________________________________   (provider comments below)

## 2017-07-09 NOTE — Telephone Encounter (Signed)
Letter in epic. Copy faxed to Dr Jackalyn Lombard office at [phone]

## 2017-07-10 ENCOUNTER — Other Ambulatory Visit: Payer: Self-pay

## 2017-07-10 MED ORDER — APIXABAN 5 MG PO TABS: 5.0000 mg | ORAL_TABLET | Freq: Two times a day (BID) | ORAL | 5 refills | Status: DC

## 2017-07-14 ENCOUNTER — Ambulatory Visit (HOSPITAL_COMMUNITY)
Admission: RE | Admit: 2017-07-14 | Discharge: 2017-07-14 | Disposition: A | Discharge status: Home / Self Care | Payer: Medicare Other | Source: Ambulatory Visit | Attending: Nurse Practitioner | Admitting: Nurse Practitioner

## 2017-07-14 ENCOUNTER — Encounter (HOSPITAL_COMMUNITY): Payer: Self-pay | Admitting: Nurse Practitioner

## 2017-07-14 VITALS — BP 136/74 | HR 58 | Ht 66.0 in | Wt 186.0 lb

## 2017-07-14 DIAGNOSIS — I350 Nonrheumatic aortic (valve) stenosis: Secondary | ICD-10-CM | POA: Diagnosis not present

## 2017-07-14 DIAGNOSIS — M1990 Unspecified osteoarthritis, unspecified site: Secondary | ICD-10-CM | POA: Insufficient documentation

## 2017-07-14 DIAGNOSIS — G8929 Other chronic pain: Secondary | ICD-10-CM | POA: Insufficient documentation

## 2017-07-14 DIAGNOSIS — K219 Gastro-esophageal reflux disease without esophagitis: Secondary | ICD-10-CM | POA: Diagnosis not present

## 2017-07-14 DIAGNOSIS — F329 Major depressive disorder, single episode, unspecified: Secondary | ICD-10-CM | POA: Insufficient documentation

## 2017-07-14 DIAGNOSIS — Z9889 Other specified postprocedural states: Secondary | ICD-10-CM | POA: Diagnosis present

## 2017-07-14 DIAGNOSIS — I481 Persistent atrial fibrillation: Secondary | ICD-10-CM

## 2017-07-14 DIAGNOSIS — M545 Low back pain: Secondary | ICD-10-CM | POA: Diagnosis not present

## 2017-07-14 DIAGNOSIS — I429 Cardiomyopathy, unspecified: Secondary | ICD-10-CM | POA: Diagnosis not present

## 2017-07-14 DIAGNOSIS — E039 Hypothyroidism, unspecified: Secondary | ICD-10-CM | POA: Diagnosis not present

## 2017-07-14 DIAGNOSIS — F419 Anxiety disorder, unspecified: Secondary | ICD-10-CM | POA: Insufficient documentation

## 2017-07-14 DIAGNOSIS — G4733 Obstructive sleep apnea (adult) (pediatric): Secondary | ICD-10-CM | POA: Diagnosis not present

## 2017-07-14 DIAGNOSIS — Z85820 Personal history of malignant melanoma of skin: Secondary | ICD-10-CM | POA: Insufficient documentation

## 2017-07-14 DIAGNOSIS — I10 Essential (primary) hypertension: Secondary | ICD-10-CM | POA: Insufficient documentation

## 2017-07-14 DIAGNOSIS — E7800 Pure hypercholesterolemia, unspecified: Secondary | ICD-10-CM | POA: Insufficient documentation

## 2017-07-14 DIAGNOSIS — E119 Type 2 diabetes mellitus without complications: Secondary | ICD-10-CM | POA: Diagnosis not present

## 2017-07-14 DIAGNOSIS — Z7901 Long term (current) use of anticoagulants: Secondary | ICD-10-CM | POA: Insufficient documentation

## 2017-07-14 DIAGNOSIS — R001 Bradycardia, unspecified: Secondary | ICD-10-CM | POA: Insufficient documentation

## 2017-07-14 DIAGNOSIS — G2581 Restless legs syndrome: Secondary | ICD-10-CM | POA: Insufficient documentation

## 2017-07-14 DIAGNOSIS — I4819 Other persistent atrial fibrillation: Secondary | ICD-10-CM

## 2017-07-14 NOTE — Progress Notes (Signed)
Primary Care Physician: Chesley Noon, MD Referring Physician:Dr. 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 Rayann Heman, in the [place] clinic for f/u.  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 Continues on apixaban without missed doses for chadsvasc score of at least 5. She has an echo in July that showed EF in [place]  had normalized.  Today, she denies symptoms of palpitations, chest pain, shortness of breath, orthopnea, PND, lower extremity edema, dizziness, presyncope, syncope, or neurologic sequela. The patient is tolerating medications without difficulties and is otherwise without complaint today.   Past Medical History:  Diagnosis Date  . Anxiety   . Chronic lower back pain   . Chronic systolic dysfunction of left ventricle   . Depression   . Fatigue   . GERD (gastroesophageal reflux disease)   . High cholesterol   . Hypertension   . Hypothyroidism   . Melanoma of shoulder, left ([place])   . Mild aortic stenosis   . Nonischemic cardiomyopathy ([place])   . OSA on CPAP    reports compliance with CPAP  . Osteoarthritis   . Persistent atrial fibrillation with RVR ([place])    [name] 11/15/2016  . RLS (restless legs syndrome)   . S/P ablation of atrial flutter/ fib 01/10/17 01/11/2017  . Type II diabetes mellitus ([place])    Past Surgical History:  Procedure Laterality Date  . ATRIAL FIBRILLATION ABLATION N/A 01/10/2017   Procedure: Atrial Fibrillation Ablation;  Surgeon: Thompson Grayer, MD;  Location: [place] [facility];  Service: Cardiovascular;  Laterality: N/A;  . CARDIOVERSION N/A 11/19/2016   Procedure: CARDIOVERSION;  Surgeon: Lelon Perla, MD;  Location: [facility] ENDOSCOPY;  Service: Cardiovascular;  Laterality: N/A;  . CARDIOVERSION N/A 12/11/2016   Procedure: CARDIOVERSION;  Surgeon: Dorothy Spark, MD;  Location: [facility] ENDOSCOPY;   Service: Cardiovascular;  Laterality: N/A;  . CARDIOVERSION N/A 12/18/2016   Procedure: CARDIOVERSION;  Surgeon: Skeet Latch, MD;  Location[facility];  Service: Cardiovascular;  Laterality: N/A;  . CATARACT EXTRACTION W/ INTRAOCULAR LENS  IMPLANT, BILATERAL Bilateral   . DILATION AND CURETTAGE OF UTERUS    . LEXISCAN MYOVIEW  05/13/2012   No ECG changes. EKG negative for ischemia. No significant ischemia demonstrated.  [name] MELANOMA EXCISION Left ~ 09/1979   "back of my shoulder"  . TEE WITHOUT CARDIOVERSION N/A 11/19/2016   Procedure: TRANSESOPHAGEAL ECHOCARDIOGRAM (TEE);  Surgeon: Lelon Perla, MD;  Location: [facility] ENDOSCOPY;  Service: Cardiovascular;  Laterality: N/A;  need CV, but no anesthesia available  . TEE WITHOUT CARDIOVERSION N/A 01/10/2017   Procedure: TRANSESOPHAGEAL ECHOCARDIOGRAM (TEE);  Surgeon: [name], MD;  Location: [facility] ENDOSCOPY;  Service: Cardiovascular;  Laterality: N/A;  . TRANSTHORACIC ECHOCARDIOGRAM  02/18/2012   EF >55%, mild aortic stenosis  . VAGINAL HYSTERECTOMY  1985    Current Outpatient Prescriptions  Medication Sig Dispense Refill  . acetaminophen (TYLENOL) 500 MG tablet Take 500-1,000 mg by mouth every 6 (six) hours as needed for mild pain, moderate pain or headache.    . ALPRAZolam (XANAX) 0.5 MG tablet Take 1 tablet by mouth 2 (two) times daily.    [name] apixaban (ELIQUIS) 5 MG TABS tablet Take 1 tablet (5 mg total) by mouth 2 (two) times daily. 60 tablet 5  . atorvastatin (LIPITOR) 20 MG tablet Take 20 mg by mouth daily.    [name]  buPROPion (WELLBUTRIN XL) 150 MG 24 hr tablet Take 2 tablets by mouth once daily    . Calcium Carbonate-Vitamin D (CALCIUM + D PO) Take 600 mg by mouth 2 (two) times daily.     . carvedilol (COREG) 6.25 MG tablet Take 1 tablet (6.25 mg total) by mouth 2 (two) times daily. 60 tablet 1  . CHERRY PO Take by mouth. Drink 1/2 cup before breakfast and 1/2 cup before supper    . cyanocobalamin (EQL VITAMIN B-12) 500 MCG tablet Take  500 mcg by mouth daily.    . furosemide (LASIX) 80 MG tablet Take 40 mg by mouth daily.     . Ginger, Zingiber officinalis, (GINGER PO) Take 1 capsule by mouth 2 (two) times daily.     [name] levothyroxine (SYNTHROID, LEVOTHROID) 88 MCG tablet Take 88 mcg by mouth daily before breakfast.    . Magnesium 250 MG TABS Take 2 tablets by mouth daily.     . Misc. Devices MISC by Does not apply route at bedtime. CPAP    . Multiple Vitamins-Minerals (PRESERVISION AREDS 2) CAPS Take 1 capsule by mouth 2 (two) times daily.    . Omega-3 Fatty Acids (FISH OIL) 1200 MG CAPS Take 1 capsule by mouth 2 (two) times daily. Take 1 at breakfast and 1 at supper    . omeprazole (PRILOSEC) 40 MG capsule Take 40 mg by mouth daily.    . potassium chloride SA (KLOR-CON M20) 20 MEQ tablet Take 2 tablets (40 mEq total) by mouth 2 (two) times daily. 360 tablet 1  . pyridOXINE (B-6) 50 MG tablet Take 50 mg by mouth daily.    [name] rOPINIRole (REQUIP) 0.5 MG tablet Take 2-3 daily at bedtime as needed 90 tablet 3  . meclizine (ANTIVERT) 25 MG tablet Take 1 tablet (25 mg total) by mouth 3 (three) times daily as needed for dizziness. (Patient not taking: Reported on 07/14/2017) 30 tablet 0   No current facility-administered medications for this encounter.     Allergies  Allergen Reactions  . Amoxicillin Nausea Only  . Crestor [Rosuvastatin] Other (See Comments)    Joints ached  . Lovastatin Other (See Comments)    Joints hurt all over body     Social History   Social History  . Marital status: Widowed    Spouse name: N/A  . Number of children: N/A  . Years of education: N/A   Occupational History  . Not on file.   Social History Main Topics  . Smoking status: Never Smoker  . Smokeless tobacco: Never Used  . Alcohol use No  . Drug use: Yes    Types: Cocaine  . Sexual activity: No   Other Topics Concern  . Not on file   Social History Narrative  . No narrative on file    Family History  Problem Relation Age of  Onset  . Hypertension Mother   . Diabetes Mother   . Heart failure Father   . Arrhythmia Sister   . Hyperlipidemia Sister   . Hypertension Sister   . Healthy Brother   . Stroke Paternal Grandmother   . Cancer Paternal Grandfather   . Healthy Daughter   . Hypertension Son   . Hyperlipidemia Son     ROS- All systems are reviewed and negative except as per the HPI above  Physical Exam: Vitals:   07/14/17 1101  BP: 136/74  Pulse: (!) 58  Weight: 186 lb (84.4 kg)  Height: 5\' 6"  (1.676 m)  Wt Readings from Last 3 Encounters:  07/14/17 186 lb (84.4 kg)  05/13/17 186 lb 9.6 oz (84.6 kg)  04/14/17 180 lb 9.6 oz (81.9 kg)    Labs: Lab Results  Component Value Date   NA 139 01/10/2017   K 3.7 01/10/2017   CL 96 (L) 01/10/2017   CO2 29 01/01/2017   GLUCOSE 117 (H) 01/10/2017   BUN 35 (H) 01/10/2017   CREATININE 1.80 (H) 01/10/2017   CALCIUM 9.7 01/01/2017   Lab Results  Component Value Date   INR 2.89 12/08/2016   Lab Results  Component Value Date   CHOL 170 02/11/2017   HDL 82 02/11/2017   LDLCALC 69 02/11/2017   TRIG 93 02/11/2017     GEN- The patient is well appearing, alert and oriented x 3 today.   Head- normocephalic, atraumatic Eyes-  Sclera clear, conjunctiva pink Ears- hearing intact Oropharynx- clear Neck- supple, no JVP Lymph- no cervical lymphadenopathy Lungs- Clear to ausculation bilaterally, normal work of breathing Heart- Regular rate and rhythm, no murmurs, rubs or gallops, PMI not laterally displaced GI- soft, NT, ND, + BS Extremities- no clubbing, cyanosis, or edema MS- no significant deformity or atrophy Skin- no rash or lesion Psych- euthymic mood, full affect Neuro- strength and sensation are intact  EKG- Sinus brady at 58 bpm, LAD, pr int 158 ms, qrs int 96 ms, qtc 400 ms Epic records reviewed Echo-Study Conclusions  - Left ventricle: The cavity size was normal. Systolic function was   normal. The estimated ejection fraction was  in the range of 60%   to 65%. Wall motion was normal; there were no regional wall   motion abnormalities. Doppler parameters are consistent with   abnormal left ventricular relaxation (grade 1 diastolic   dysfunction). There was no evidence of elevated ventricular   filling pressure by Doppler parameters. - Aortic valve: Functionally bicuspid; severely thickened, severely   calcified leaflets. There was moderate stenosis. There was   trivial regurgitation. Mean gradient (S): 27 mm Hg. Peak gradient   (S): 39 mm Hg. - Right atrium: The atrium was normal in size. - Tricuspid valve: There was no significant regurgitation. - Pulmonary arteries: Systolic pressure could not be accurately   estimated. - Inferior vena cava: The vessel was normal in size. - Pericardium, extracardiac: There was no pericardial effusion.   Assessment and Plan: 1. Persistent  afib s/p ablation and is doing well with no known afib burden Continue carvedilol bid Continue eliquis bid  2. NICM EF recovered with return of SR   3. OSA Continue cpap  4. HTN Stable  f/u with Dr. Rayann Heman 1/7 Dr. Debara Pickett as scheduled afib clinic as needed  Geroge Baseman. Ingeborg Fite, [facility] [address] [place], [place] [address] [phone]

## 2017-07-28 ENCOUNTER — Ambulatory Visit (INDEPENDENT_AMBULATORY_CARE_PROVIDER_SITE_OTHER): Payer: Medicare Other | Admitting: Pulmonary Disease

## 2017-07-28 ENCOUNTER — Encounter: Payer: Self-pay | Admitting: Pulmonary Disease

## 2017-07-28 DIAGNOSIS — G2581 Restless legs syndrome: Secondary | ICD-10-CM | POA: Diagnosis not present

## 2017-07-28 DIAGNOSIS — G4733 Obstructive sleep apnea (adult) (pediatric): Secondary | ICD-10-CM

## 2017-07-28 NOTE — Patient Instructions (Signed)
Prescription for new CPAP 11 cm will be sent to DME. Download checked in 1 month

## 2017-07-28 NOTE — Assessment & Plan Note (Signed)
Continue Requip at current dosing

## 2017-07-28 NOTE — Progress Notes (Signed)
   Subjective:    Patient ID: Jenna [name], female    DOB: Sep 07, 1941, 76 y.o.   MRN: [id number]  HPI  4 y o for FU of  obstructive sleep apnea and restless leg syndrome  She's compliant with her CPAP nasal pillows and feels that this is really helped her. She was diagnosed with atrial fibrillation and underwent atrial fibrillation earlier this year, continues on Coreg and Eliquis  She has been told by DME, [facility] that she is eligible for new machine. Bedtime is around 10 PM, 2-3 nocturnal awakenings including nocturia and is out of bed by 7:45 AM, feels not fully refreshed in spite of using her CPAP. Unable to get download her CPAP she today. She has lost weight from 209-188 pounds over the last 2 years  Restless legs starts bothering her in the afternoon and she has to take 1 tablet of Requip 10 and then another 2 tablets at bedtime   Significant tests/ events reviewed  NPSG 2005: AHI 17/hr, optimal pressure 11cm Auto 2014: Optimal pressure 13cm   Review of Systems Patient denies significant dyspnea,cough, hemoptysis,  chest pain, palpitations, pedal edema, orthopnea, paroxysmal nocturnal dyspnea, lightheadedness, nausea, vomiting, abdominal or  leg pains      Objective:   Physical Exam   Gen. Pleasant, well-nourished, in no distress ENT - no thrush, no post nasal drip Neck: No JVD, no thyromegaly, no carotid bruits Lungs: no use of accessory muscles, no dullness to percussion, clear without rales or rhonchi  Cardiovascular: Rhythm regular, heart sounds  normal, no murmurs or gallops, no peripheral edema Musculoskeletal: No deformities, no cyanosis or clubbing         Assessment & Plan:

## 2017-07-28 NOTE — Assessment & Plan Note (Signed)
Prescription for new CPAP 11 cm will be sent to DME. Download checked in 1 month CPAP has really helped her and is indicated in view of cardiac comorbidities  Weight loss encouraged, compliance with goal of at least 4-6 hrs every night is the expectation. Advised against medications with sedative side effects Cautioned against driving when sleepy - understanding that sleepiness will vary on a day to day basis

## 2017-07-28 NOTE — Addendum Note (Signed)
Addended by: Georjean Mode on: 07/28/2017 12:54 PM   Modules accepted: Orders

## 2017-07-29 ENCOUNTER — Ambulatory Visit: Payer: Medicare Other | Admitting: Pulmonary Disease

## 2017-10-13 ENCOUNTER — Encounter: Payer: Self-pay | Admitting: Internal Medicine

## 2017-10-13 ENCOUNTER — Ambulatory Visit (INDEPENDENT_AMBULATORY_CARE_PROVIDER_SITE_OTHER): Payer: Medicare Other | Admitting: Internal Medicine

## 2017-10-13 VITALS — BP 146/80 | HR 59 | Ht 65.0 in | Wt 192.2 lb

## 2017-10-13 DIAGNOSIS — R0609 Other forms of dyspnea: Secondary | ICD-10-CM

## 2017-10-13 DIAGNOSIS — G4733 Obstructive sleep apnea (adult) (pediatric): Secondary | ICD-10-CM | POA: Diagnosis not present

## 2017-10-13 DIAGNOSIS — I428 Other cardiomyopathies: Secondary | ICD-10-CM

## 2017-10-13 DIAGNOSIS — I481 Persistent atrial fibrillation: Secondary | ICD-10-CM

## 2017-10-13 DIAGNOSIS — I4819 Other persistent atrial fibrillation: Secondary | ICD-10-CM

## 2017-10-13 DIAGNOSIS — R0600 Dyspnea, unspecified: Secondary | ICD-10-CM

## 2017-10-13 NOTE — Patient Instructions (Signed)
Medication Instructions:  Your physician recommends that you continue on your current medications as directed. Please refer to the Current Medication list given to you today.  * If you need a refill on your cardiac medications before your next appointment, please call your pharmacy. *  Labwork: None ordered  Testing/Procedures: None ordered  Follow-Up: Your physician wants you to follow-up in: 6 months with Roderic Palau, NP in the AFib clinic. You will receive a reminder letter in the mail two months in advance. If you don't receive a letter, please call our office to schedule the follow-up appointment.  Your physician wants you to follow-up in: 1 year with Dr. Rayann Heman.  You will receive a reminder letter in the mail two months in advance. If you don't receive a letter, please call our office to schedule the follow-up appointment.  Thank you for choosing [facility]!!   .

## 2017-10-13 NOTE — Progress Notes (Signed)
PCP: Chesley Noon, MD Primary Cardiologist: Dr Debara Pickett Primary EP: Dr Liston Alba is a 77 y.o. female who presents today for routine electrophysiology followup.  Since last being seen in our clinic, the patient reports doing very well.  + SOB with moderate activity.  She is not very active and does not exercise.  Today, she denies symptoms of palpitations, chest pain, lower extremity edema, dizziness, presyncope, or syncope.  The patient is otherwise without complaint today.   Past Medical History:  Diagnosis Date  . Anxiety   . Chronic lower back pain   . Chronic systolic dysfunction of left ventricle   . Depression   . Fatigue   . GERD (gastroesophageal reflux disease)   . High cholesterol   . Hypertension   . Hypothyroidism   . Melanoma of shoulder, left ([place])   . Mild aortic stenosis   . Nonischemic cardiomyopathy ([place])   . OSA on CPAP    reports compliance with CPAP  . Osteoarthritis   . Persistent atrial fibrillation with RVR ([place])    Jenna [name] 11/15/2016  . RLS (restless legs syndrome)   . S/P ablation of atrial flutter/ fib 01/10/17 01/11/2017  . Type II diabetes mellitus ([place])    Past Surgical History:  Procedure Laterality Date  . ATRIAL FIBRILLATION ABLATION N/A 01/10/2017   Procedure: Atrial Fibrillation Ablation;  Surgeon: Thompson Grayer, MD;  Location: [facility];  Service: Cardiovascular;  Laterality: N/A;  . CARDIOVERSION N/A 11/19/2016   Procedure: CARDIOVERSION;  Surgeon: Lelon Perla, MD;  Location: [facility] ENDOSCOPY;  Service: Cardiovascular;  Laterality: N/A;  . CARDIOVERSION N/A 12/11/2016   Procedure: CARDIOVERSION;  Surgeon: Dorothy Spark, MD;  Location: [facility] ENDOSCOPY;  Service: Cardiovascular;  Laterality: N/A;  . CARDIOVERSION N/A 12/18/2016   Procedure: CARDIOVERSION;  Surgeon: Skeet Latch, MD;  Location: [facility];  Service: Cardiovascular;  Laterality: N/A;  . CATARACT EXTRACTION W/ INTRAOCULAR LENS  IMPLANT, BILATERAL Bilateral     . DILATION AND CURETTAGE OF UTERUS    . LEXISCAN MYOVIEW  05/13/2012   No ECG changes. EKG negative for ischemia. No significant ischemia demonstrated.  [name] MELANOMA EXCISION Left ~ 09/1979   "back of my shoulder"  . TEE WITHOUT CARDIOVERSION N/A 11/19/2016   Procedure: TRANSESOPHAGEAL ECHOCARDIOGRAM (TEE);  Surgeon: Lelon Perla, MD;  Location: [facility] ENDOSCOPY;  Service: Cardiovascular;  Laterality: N/A;  need CV, but no anesthesia available  . TEE WITHOUT CARDIOVERSION N/A 01/10/2017   Procedure: TRANSESOPHAGEAL ECHOCARDIOGRAM (TEE);  Surgeon: [name], MD;  Location: [facility] ENDOSCOPY;  Service: Cardiovascular;  Laterality: N/A;  . TRANSTHORACIC ECHOCARDIOGRAM  02/18/2012   EF >55%, mild aortic stenosis  . VAGINAL HYSTERECTOMY  1985    ROS- all systems are reviewed and negatives except as per HPI above  Current Outpatient Medications  Medication Sig Dispense Refill  . acetaminophen (TYLENOL) 500 MG tablet Take 500-1,000 mg by mouth every 6 (six) hours as needed for mild pain, moderate pain or headache.    . ALPRAZolam (XANAX) 0.5 MG tablet Take 1 tablet by mouth 2 (two) times daily.    [name] apixaban (ELIQUIS) 5 MG TABS tablet Take 1 tablet (5 mg total) by mouth 2 (two) times daily. 60 tablet 5  . atorvastatin (LIPITOR) 40 MG tablet Take 20 mg by mouth daily.    [name] buPROPion (WELLBUTRIN XL) 150 MG 24 hr tablet Take 2 tablets by mouth once daily    . Calcium Carbonate-Vitamin D (CALCIUM + D  PO) Take 600 mg by mouth 2 (two) times daily.     . carvedilol (COREG) 6.25 MG tablet Take 1 tablet (6.25 mg total) by mouth 2 (two) times daily. 60 tablet 1  . CHERRY PO Take by mouth. Drink 1/2 cup before breakfast and 1/2 cup before supper    . cyanocobalamin (EQL VITAMIN B-12) 500 MCG tablet Take 500 mcg by mouth daily.    . furosemide (LASIX) 80 MG tablet Alternate 40 mg and 80 mg by mouth daily.    . Ginger, Zingiber officinalis, (GINGER PO) Take 1 capsule by mouth 2 (two) times daily.     [name]  levothyroxine (SYNTHROID, LEVOTHROID) 88 MCG tablet Take 88 mcg by mouth daily before breakfast.    . Magnesium 250 MG TABS Take 2 tablets by mouth daily.     . meclizine (ANTIVERT) 25 MG tablet Take 1 tablet (25 mg total) by mouth 3 (three) times daily as needed for dizziness. 30 tablet 0  . Misc. Devices MISC by Does not apply route at bedtime. CPAP    . Multiple Vitamins-Minerals (PRESERVISION AREDS 2) CAPS Take 1 capsule by mouth 2 (two) times daily.    . Omega-3 Fatty Acids (FISH OIL) 1200 MG CAPS Take 1 capsule by mouth 2 (two) times daily. Take 1 at breakfast and 1 at supper    . omeprazole (PRILOSEC) 40 MG capsule Take 40 mg by mouth daily.    . potassium chloride SA (KLOR-CON M20) 20 MEQ tablet Take 2 tablets (40 mEq total) by mouth 2 (two) times daily. 360 tablet 1  . pyridOXINE (B-6) 50 MG tablet Take 50 mg by mouth daily.    [name] rOPINIRole (REQUIP) 0.5 MG tablet Take 2-3 daily at bedtime as needed 90 tablet 3   No current facility-administered medications for this visit.     Physical Exam: Vitals:   10/13/17 1133  BP: (!) 146/80  Pulse: (!) 59  Weight: 192 lb 3.2 oz (87.2 kg)  Height: 5\' 5"  (1.651 m)    GEN- The patient is well appearing, alert and oriented x 3 today.   Head- normocephalic, atraumatic Eyes-  Sclera clear, conjunctiva pink Ears- hearing intact Oropharynx- clear Lungs- Clear to ausculation bilaterally, normal work of breathing Heart- Regular rate and rhythm, no murmurs, rubs or gallops, PMI not laterally displaced GI- soft, NT, ND, + BS Extremities- no clubbing, cyanosis, or edema  EKG tracing ordered today is personally reviewed and shows sinus rhythm 59 bpm, PR 164 msec, QRS 100 msec, Qtc 388 msec, anterior infarct pattern  Assessment and Plan:  1. Persistent atrial fibrillation Maintaining sinus rhythm post ablation off AAD therapy chads2vasc score is 5.  Continue long term anticaogulation  2. Nonischemic CM EF has recovered with sinus  rhythm  3. OSA Compliance with CPAP encouraged  4. Hypertensive cardiovascular disease Stable No change required today  5. Deconditioning Regular exercise discussed at length.  I have advised water aerobics at the [facility] [employer] or silver sneakers. She says that she will start soon.  Return to see Butch Penny in the AF clinic in 6 months I will see in a year  Thompson Grayer MD, [facility] 10/13/2017 11:43 AM

## 2017-11-19 ENCOUNTER — Ambulatory Visit (INDEPENDENT_AMBULATORY_CARE_PROVIDER_SITE_OTHER): Payer: Medicare Other

## 2017-11-19 ENCOUNTER — Encounter (INDEPENDENT_AMBULATORY_CARE_PROVIDER_SITE_OTHER): Payer: Self-pay | Admitting: Orthopaedic Surgery

## 2017-11-19 ENCOUNTER — Ambulatory Visit (INDEPENDENT_AMBULATORY_CARE_PROVIDER_SITE_OTHER): Payer: Medicare Other | Admitting: Orthopaedic Surgery

## 2017-11-19 DIAGNOSIS — M25561 Pain in right knee: Secondary | ICD-10-CM

## 2017-11-19 DIAGNOSIS — G8929 Other chronic pain: Secondary | ICD-10-CM

## 2017-11-19 DIAGNOSIS — M25562 Pain in left knee: Secondary | ICD-10-CM

## 2017-11-19 MED ORDER — LIDOCAINE HCL 1 % IJ SOLN
3.0000 mL | INTRAMUSCULAR | Status: AC | PRN
  Administered 2017-11-19: 3 mL

## 2017-11-19 MED ORDER — METHYLPREDNISOLONE ACETATE 40 MG/ML IJ SUSP
40.0000 mg | INTRAMUSCULAR | Status: AC | PRN
  Administered 2017-11-19: 40 mg via INTRA_ARTICULAR

## 2017-11-19 NOTE — Progress Notes (Signed)
   Procedure Note  Patient: Jenna [name]             Date of Birth: Feb 27, 1941           MRN: [id number]             Visit Date: 11/19/2017  HPI: Jenna [name] 77 year old female well-known to Dr. Ninfa [place] service comes in today due to right greater than left knee pain.  We have seen her in the past for this but it has been sometime since we last saw her.  She is having right knee pain and having to use her cane.  States her pain radiates down into her shins at the time.  She is requesting a cortisone injection in her right knee.  States the left knee really does not bother that bad.  She has had supplemental injections in both knees in the past that have helped.  She is questioning if she can have injections due to the fact she is on Eliquis now due to her A. Fib.  Physical exam: General well-developed well-nourished female no acute distress.  She ambulates with a cane in her right hand. Bilateral knees: Good range of motion of both knees.  Valgus deformities of both knees.  No effusion abnormal warmth erythema.  She has tenderness along medial joint line of left knee and lateral joint line of the right knee.  Patellofemoral crepitus with passive range of motion of the right knee.  No instability valgus varus stressing of either knee.  Raadiographs: Right knee: Bone-on-bone lateral compartment with moderate medial compartmental arthritis and severe patellofemoral arthritis.  Knee is well located.  No acute fracture. Left knee: No acute fracture.  No subluxation dislocation.  Mild to moderate narrowing medial joint line.  Lateral joint line overall well-maintained.  Moderate patellofemoral arthritic changes.  No other bony abnormalities.  Procedures: Visit Diagnoses: Chronic pain of both knees - Plan: XR Knee 1-2 Views Left, XR Knee 1-2 Views Right  Large Joint Inj on 11/19/2017 11:45 AM Indications: pain Details: 22 G 1.5 in needle, anterolateral approach  Arthrogram: No  Medications: 3 mL  lidocaine 1 %; 40 mg methylPREDNISolone acetate 40 MG/ML Outcome: tolerated well, no immediate complications Procedure, treatment alternatives, risks and benefits explained, specific risks discussed. Consent was given by the patient. Immediately prior to procedure a time out was called to verify the correct patient, procedure, equipment, support staff and site/side marked as required. Patient was prepped and draped in the usual sterile fashion.     Plan: We will order a supplemental injection for her right knee have her return once this is available.  Questions encouraged and answered at length today.  She is to monitor glucose levels over the next few days as she understands that the cortisone may raise these.  She may benefit from a cortisone injection in the left knee in the future.

## 2017-11-20 ENCOUNTER — Telehealth (INDEPENDENT_AMBULATORY_CARE_PROVIDER_SITE_OTHER): Payer: Self-pay

## 2017-11-20 NOTE — Telephone Encounter (Signed)
Submitted application for right knee injection for [employer]

## 2017-11-24 ENCOUNTER — Telehealth (INDEPENDENT_AMBULATORY_CARE_PROVIDER_SITE_OTHER): Payer: Self-pay

## 2017-11-24 NOTE — Telephone Encounter (Signed)
Called and left VM advising patient to return call concerning Monovisc Injection for right knee.  Patient covered at 100% for Right Knee Monovisc Inj.  We can [facility] for injection, but patient will have a co-pay of $90.00.

## 2017-12-08 ENCOUNTER — Encounter (INDEPENDENT_AMBULATORY_CARE_PROVIDER_SITE_OTHER): Payer: Self-pay | Admitting: Orthopaedic Surgery

## 2017-12-08 ENCOUNTER — Ambulatory Visit (INDEPENDENT_AMBULATORY_CARE_PROVIDER_SITE_OTHER): Payer: Medicare Other | Admitting: Orthopaedic Surgery

## 2017-12-08 DIAGNOSIS — M1711 Unilateral primary osteoarthritis, right knee: Secondary | ICD-10-CM

## 2017-12-08 MED ORDER — HYALURONAN 88 MG/4ML IX SOSY
88.0000 mg | PREFILLED_SYRINGE | INTRA_ARTICULAR | Status: AC | PRN
  Administered 2017-12-08: 88 mg via INTRA_ARTICULAR

## 2017-12-08 NOTE — Progress Notes (Signed)
   Procedure Note  Patient: Jenna [name]             Date of Birth: August 22, 1941           MRN: [id number]             Visit Date: 12/08/2017  Procedures: Visit Diagnoses: Primary osteoarthritis of right knee  Large Joint Inj: R knee on 12/08/2017 3:14 PM Indications: pain and diagnostic evaluation Details: 22 G 1.5 in needle, superolateral approach  Arthrogram: No  Medications: 88 mg Hyaluronan 88 MG/4ML Outcome: tolerated well, no immediate complications Procedure, treatment alternatives, risks and benefits explained, specific risks discussed. Consent was given by the patient. Immediately prior to procedure a time out was called to verify the correct patient, procedure, equipment, support staff and site/side marked as required. Patient was prepped and draped in the usual sterile fashion.    The patient is here for scheduled hyaluronic acid injection of Monovisc in her right knee to treat moderate osteoarthritis and pain from this.  She has had steroid injections in the past.  She has had hyaluronic acid injection in the past that have helped as well.  On examination she has valgus malalignment of her knee with global tenderness and patellofemoral crepitation.  X-rays again show the significance of her arthritis in that right knee.  This is osteoarthritis.  She tolerated a Monovisc injection well.  She will follow-up as needed with the understanding she can always have a steroid injection down the road if needed.  All questions concerns were answered and addressed.

## 2017-12-10 ENCOUNTER — Other Ambulatory Visit: Payer: Self-pay | Admitting: Internal Medicine

## 2018-01-05 ENCOUNTER — Other Ambulatory Visit: Payer: Self-pay | Admitting: Internal Medicine

## 2018-01-20 IMAGING — US US ABDOMEN COMPLETE
1 series · 14 of 25 positions shown · non-contrast
Comparison: None in PACs

CLINICAL DATA: Abnormal liver function studies, diabetes,
hypertension.

EXAM:
ABDOMEN ULTRASOUND COMPLETE

[Series 1: us abdomen complete · 0.23mm/px · 14 of 89 slices shown]
[im 1/89]
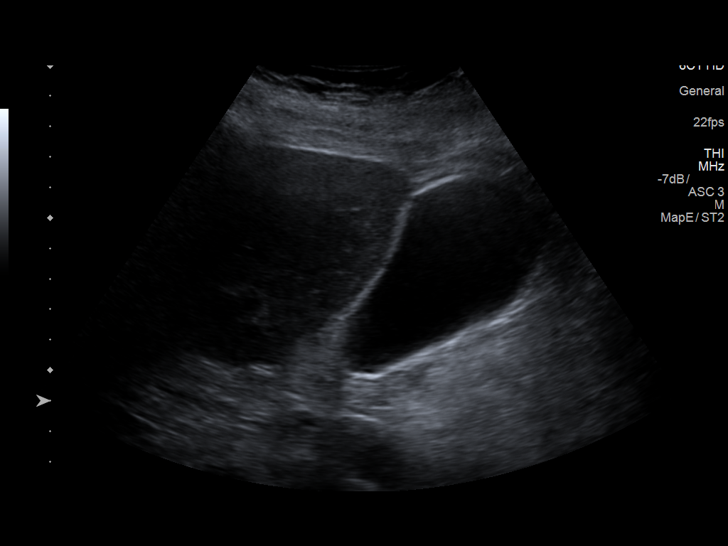
[im 8/89]
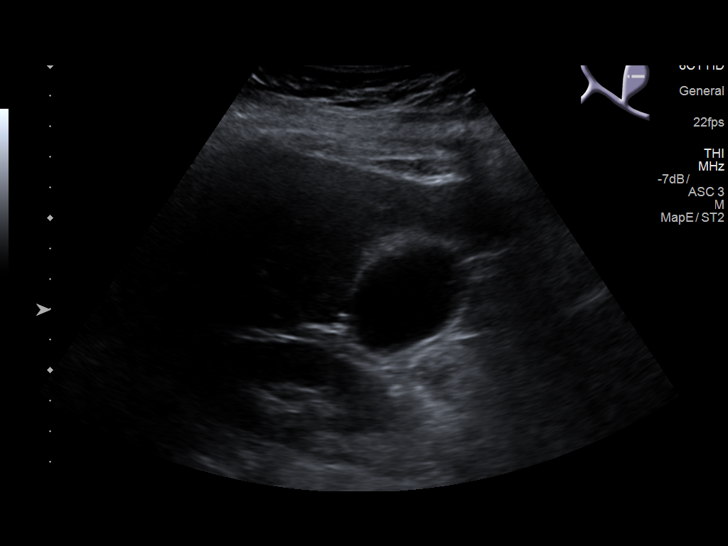
[im 15/89]
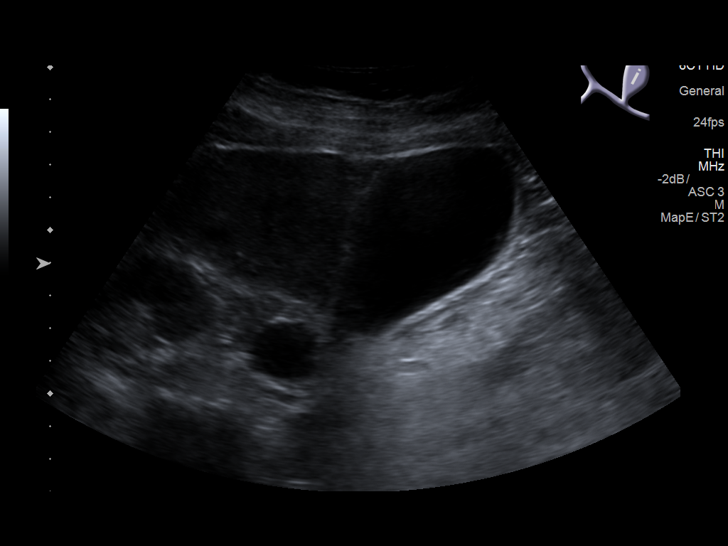
[im 23/89]
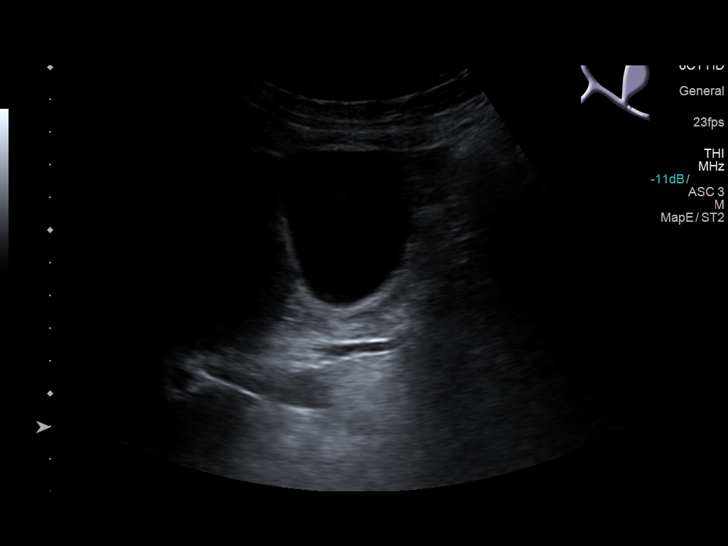
[im 30/89]
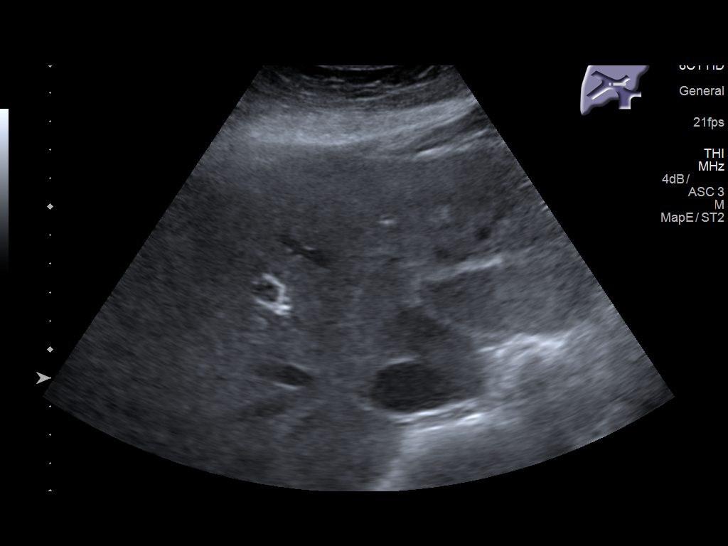
[im 34/89]
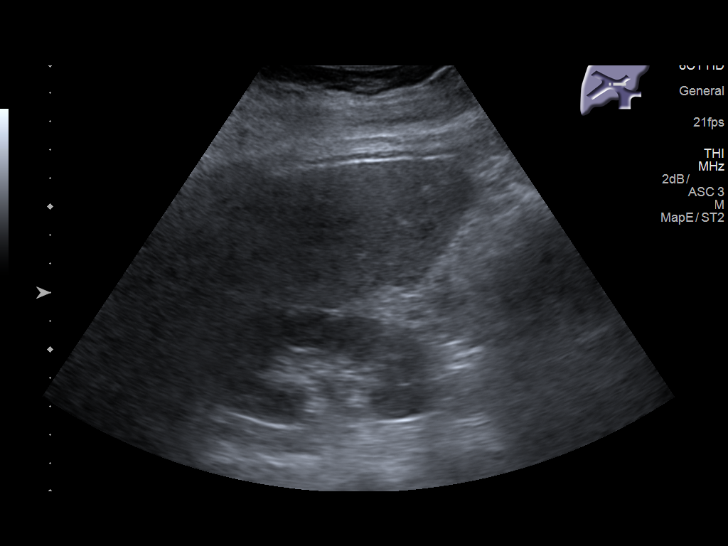
[im 41/89]
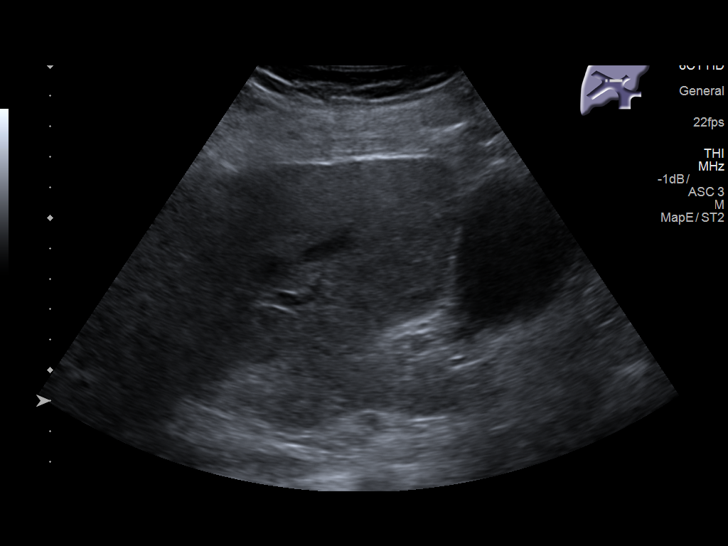
[im 48/89]
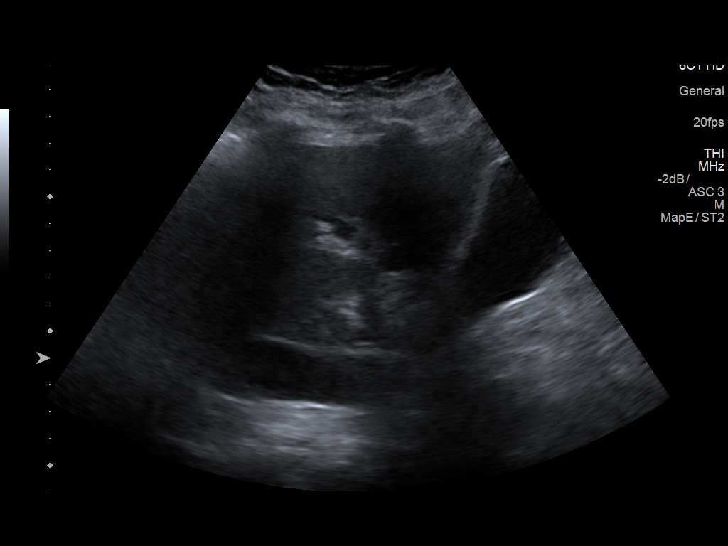
[im 56/89]
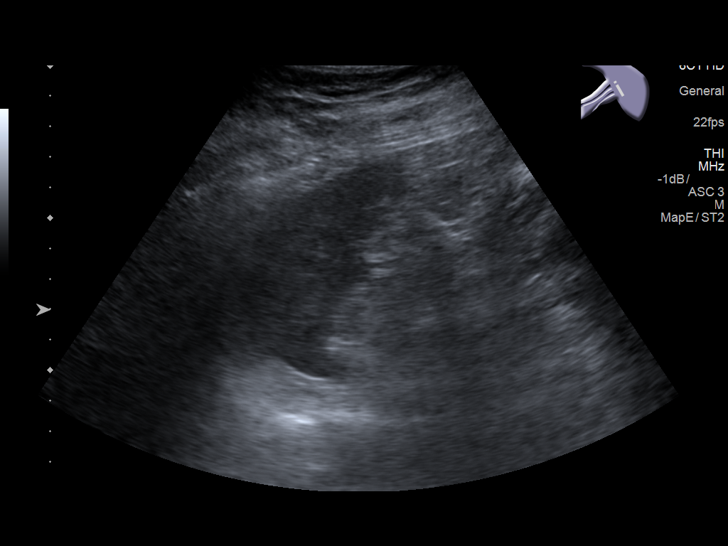
[im 59/89]
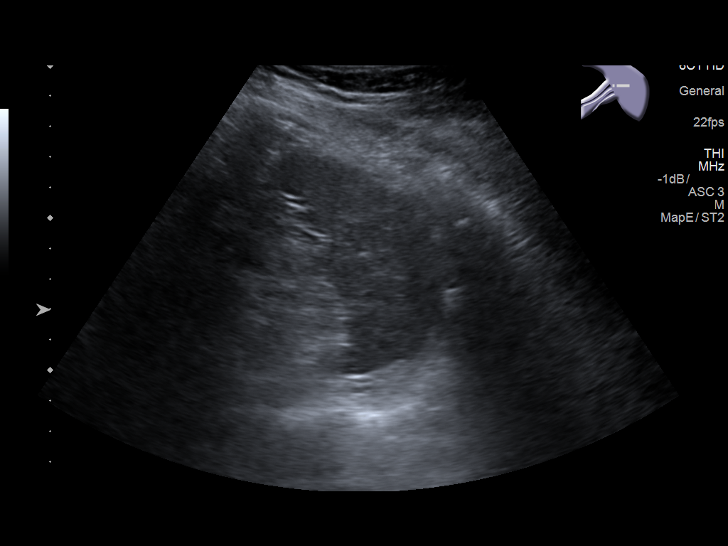
[im 67/89]
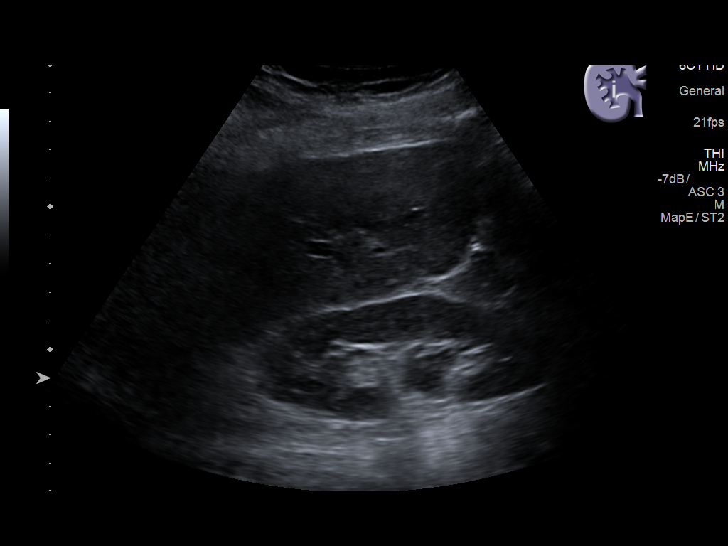
[im 74/89]
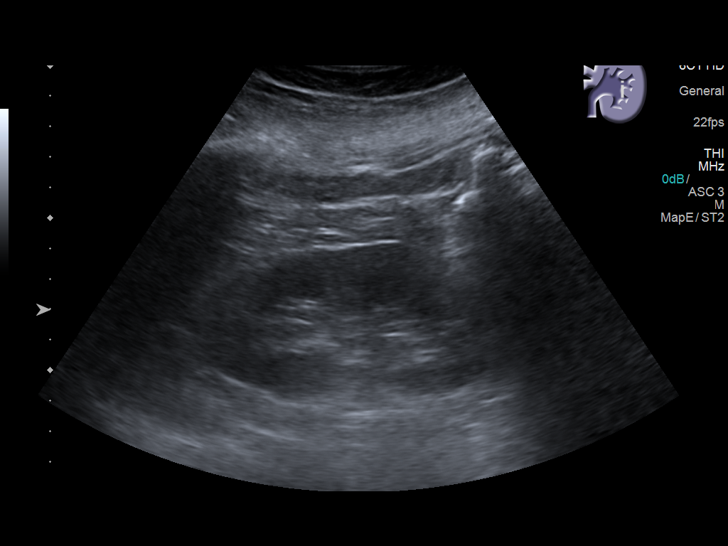
[im 81/89]
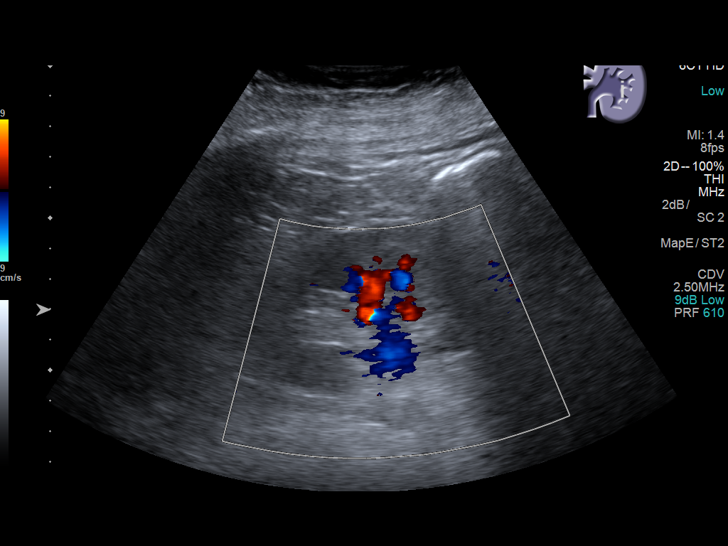
[im 89/89]
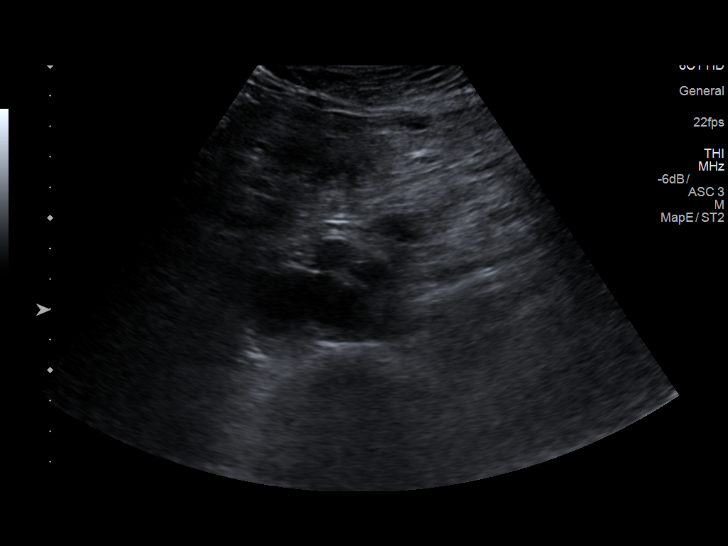

[14 of 25 positions shown; findings below may reference images not displayed]

FINDINGS: Gallbladder: No gallstones or wall thickening visualized. No
sonographic Murphy sign noted by sonographer.

Common bile duct: Diameter: 3.6 mm

Liver: No focal lesion identified. Within normal limits in
parenchymal echogenicity.

IVC: No abnormality visualized.

Pancreas: Visualized portion unremarkable.

Spleen: Size and appearance within normal limits.

Right Kidney: Length: 11.8 cm. Echogenicity within normal limits. No
mass or hydronephrosis visualized.

Left Kidney: Length: 10.3 cm. Echogenicity within normal limits. No
mass or hydronephrosis visualized.

Abdominal aorta: No aneurysm visualized.

Other findings: None.
IMPRESSION: No gallstones or sonographic evidence of acute cholecystitis.

Normal appearance of the liver, spleen, and pancreas.

No acute intra-abdominal abnormality is observed.

## 2018-01-26 ENCOUNTER — Other Ambulatory Visit: Payer: Self-pay | Admitting: Nurse Practitioner

## 2018-01-26 ENCOUNTER — Ambulatory Visit
Admission: RE | Admit: 2018-01-26 | Discharge: 2018-01-26 | Disposition: A | Discharge status: Home / Self Care | Payer: Medicare Other | Source: Ambulatory Visit | Attending: Nurse Practitioner | Admitting: Nurse Practitioner

## 2018-01-26 DIAGNOSIS — J40 Bronchitis, not specified as acute or chronic: Secondary | ICD-10-CM

## 2018-02-21 ENCOUNTER — Other Ambulatory Visit: Payer: Self-pay | Admitting: Physician Assistant

## 2018-02-23 ENCOUNTER — Telehealth (INDEPENDENT_AMBULATORY_CARE_PROVIDER_SITE_OTHER): Payer: Self-pay | Admitting: Orthopaedic Surgery

## 2018-02-23 NOTE — Telephone Encounter (Signed)
She good for this? If not can we order it for her?

## 2018-02-23 NOTE — Telephone Encounter (Signed)
Patient is good!  Thank you  Patient is covered at 100% [employer] Monovisc Inj.,Right Knee $90.00 co-pay

## 2018-02-23 NOTE — Telephone Encounter (Signed)
Patient is scheduled for a Monovisc injection 03/10/18. The number to contact patient is [phone].

## 2018-02-23 NOTE — Telephone Encounter (Signed)
Rx has been sent to the pharmacy electronically. ° °

## 2018-03-03 ENCOUNTER — Other Ambulatory Visit: Payer: Self-pay | Admitting: Family Medicine

## 2018-03-03 DIAGNOSIS — Z1231 Encounter for screening mammogram for malignant neoplasm of breast: Secondary | ICD-10-CM

## 2018-03-10 ENCOUNTER — Ambulatory Visit (INDEPENDENT_AMBULATORY_CARE_PROVIDER_SITE_OTHER): Payer: Medicare Other | Admitting: Orthopaedic Surgery

## 2018-03-10 ENCOUNTER — Encounter (INDEPENDENT_AMBULATORY_CARE_PROVIDER_SITE_OTHER): Payer: Self-pay | Admitting: Orthopaedic Surgery

## 2018-03-10 DIAGNOSIS — M1711 Unilateral primary osteoarthritis, right knee: Secondary | ICD-10-CM

## 2018-03-10 MED ORDER — HYALURONAN 88 MG/4ML IX SOSY
88.0000 mg | PREFILLED_SYRINGE | INTRA_ARTICULAR | Status: AC | PRN
  Administered 2018-03-10: 88 mg via INTRA_ARTICULAR

## 2018-03-10 NOTE — Progress Notes (Signed)
   Procedure Note  Patient: Jenna [name]             Date of Birth: 10-25-1940           MRN: [id number]             Visit Date: 03/10/2018 HPI: [name] comes in today for Monovisc injection right knee.  She states that she is having increased pain in the knee.  She had no other injury.  States the last Monovisc injection to relieve for several months. Physical exam: Right knee no effusion abnormal warmth or erythema.  She has tenderness along medial lateral joint line.  She lacks full extension of the right knee. Procedures: Visit Diagnoses: Primary osteoarthritis of right knee  Large Joint Inj: R knee on 03/10/2018 9:48 AM Indications: pain Details: 22 G 1.5 in needle, anterolateral approach  Arthrogram: No  Medications: 88 mg Hyaluronan 88 MG/4ML Outcome: tolerated well, no immediate complications Procedure, treatment alternatives, risks and benefits explained, specific risks discussed. Consent was given by the patient. Immediately prior to procedure a time out was called to verify the correct patient, procedure, equipment, support staff and site/side marked as required. Patient was prepped and draped in the usual sterile fashion.    Plan: She understands that she can only have the Monovisc injections every 6 months.  Cortisone injections every 3.  We will see her back on an as-needed basis.

## 2018-04-03 ENCOUNTER — Ambulatory Visit
Admission: RE | Admit: 2018-04-03 | Discharge: 2018-04-03 | Disposition: A | Discharge status: Home / Self Care | Payer: Medicare Other | Source: Ambulatory Visit | Attending: Family Medicine | Admitting: Family Medicine

## 2018-04-03 DIAGNOSIS — Z1231 Encounter for screening mammogram for malignant neoplasm of breast: Secondary | ICD-10-CM

## 2018-04-08 ENCOUNTER — Encounter (HOSPITAL_COMMUNITY): Payer: Self-pay | Admitting: Nurse Practitioner

## 2018-04-08 ENCOUNTER — Ambulatory Visit (HOSPITAL_COMMUNITY)
Admission: RE | Admit: 2018-04-08 | Discharge: 2018-04-08 | Disposition: A | Discharge status: Home / Self Care | Payer: Medicare Other | Source: Ambulatory Visit | Attending: Nurse Practitioner | Admitting: Nurse Practitioner

## 2018-04-08 VITALS — BP 128/84 | HR 98 | Ht 65.0 in | Wt 192.0 lb

## 2018-04-08 DIAGNOSIS — Z85820 Personal history of malignant melanoma of skin: Secondary | ICD-10-CM | POA: Insufficient documentation

## 2018-04-08 DIAGNOSIS — I444 Left anterior fascicular block: Secondary | ICD-10-CM | POA: Insufficient documentation

## 2018-04-08 DIAGNOSIS — E119 Type 2 diabetes mellitus without complications: Secondary | ICD-10-CM | POA: Insufficient documentation

## 2018-04-08 DIAGNOSIS — I429 Cardiomyopathy, unspecified: Secondary | ICD-10-CM | POA: Insufficient documentation

## 2018-04-08 DIAGNOSIS — F329 Major depressive disorder, single episode, unspecified: Secondary | ICD-10-CM | POA: Insufficient documentation

## 2018-04-08 DIAGNOSIS — G2581 Restless legs syndrome: Secondary | ICD-10-CM | POA: Insufficient documentation

## 2018-04-08 DIAGNOSIS — F419 Anxiety disorder, unspecified: Secondary | ICD-10-CM | POA: Insufficient documentation

## 2018-04-08 DIAGNOSIS — G4733 Obstructive sleep apnea (adult) (pediatric): Secondary | ICD-10-CM | POA: Diagnosis not present

## 2018-04-08 DIAGNOSIS — E7800 Pure hypercholesterolemia, unspecified: Secondary | ICD-10-CM | POA: Diagnosis not present

## 2018-04-08 DIAGNOSIS — M1990 Unspecified osteoarthritis, unspecified site: Secondary | ICD-10-CM | POA: Diagnosis not present

## 2018-04-08 DIAGNOSIS — Z79899 Other long term (current) drug therapy: Secondary | ICD-10-CM | POA: Diagnosis not present

## 2018-04-08 DIAGNOSIS — I10 Essential (primary) hypertension: Secondary | ICD-10-CM | POA: Insufficient documentation

## 2018-04-08 DIAGNOSIS — I481 Persistent atrial fibrillation: Secondary | ICD-10-CM | POA: Diagnosis not present

## 2018-04-08 DIAGNOSIS — Z9889 Other specified postprocedural states: Secondary | ICD-10-CM | POA: Insufficient documentation

## 2018-04-08 DIAGNOSIS — I4819 Other persistent atrial fibrillation: Secondary | ICD-10-CM

## 2018-04-08 DIAGNOSIS — K219 Gastro-esophageal reflux disease without esophagitis: Secondary | ICD-10-CM | POA: Diagnosis not present

## 2018-04-08 DIAGNOSIS — E039 Hypothyroidism, unspecified: Secondary | ICD-10-CM | POA: Diagnosis not present

## 2018-04-08 DIAGNOSIS — Z7901 Long term (current) use of anticoagulants: Secondary | ICD-10-CM | POA: Insufficient documentation

## 2018-04-08 NOTE — Progress Notes (Signed)
Primary Care Physician: Chesley Noon, MD Referring Physician:Dr. 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 female with a h/o afib ablation 01/10/2017 by Dr. Rayann Heman, in the [place] [facility] for f/u, 04/08/18.  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 Continues on apixaban without missed doses for chadsvasc score of at least 5. She has an echo in July 2018 that showed EF in [name]  had normalized.She had nosebleeds for a while but stopped fish oil and these resolved  Today, she denies symptoms of palpitations, chest pain, shortness of breath, orthopnea, PND, lower extremity edema, dizziness, presyncope, syncope, or neurologic sequela. The patient is tolerating medications without difficulties and is otherwise without complaint today.   Past Medical History:  Diagnosis Date  . Anxiety   . Chronic lower back pain   . Chronic systolic dysfunction of left ventricle   . Depression   . Fatigue   . GERD (gastroesophageal reflux disease)   . High cholesterol   . Hypertension   . Hypothyroidism   . Melanoma of shoulder, left ([place])   . Mild aortic stenosis   . Nonischemic cardiomyopathy ([place])   . OSA on CPAP    reports compliance with CPAP  . Osteoarthritis   . Persistent atrial fibrillation with RVR ([place])    [name] 11/15/2016  . RLS (restless legs syndrome)   . S/P ablation of atrial flutter/ fib 01/10/17 01/11/2017  . Type II diabetes mellitus ([place])    Past Surgical History:  Procedure Laterality Date  . ATRIAL FIBRILLATION ABLATION N/A 01/10/2017   Procedure: Atrial Fibrillation Ablation;  Surgeon: Thompson Grayer, MD;  Location: [facility];  Service: Cardiovascular;  Laterality: N/A;  . CARDIOVERSION N/A 11/19/2016   Procedure: CARDIOVERSION;  Surgeon: Lelon Perla, MD;  Location: [facility] ENDOSCOPY;  Service: Cardiovascular;  Laterality: N/A;  . CARDIOVERSION N/A 12/11/2016   Procedure: CARDIOVERSION;  Surgeon: Dorothy Spark, MD;  Location: [facility] ENDOSCOPY;  Service: Cardiovascular;  Laterality: N/A;  . CARDIOVERSION N/A 12/18/2016   Procedure: CARDIOVERSION;  Surgeon: Skeet Latch, MD;  Location: [facility];  Service: Cardiovascular;  Laterality: N/A;  . CATARACT EXTRACTION W/ INTRAOCULAR LENS  IMPLANT, BILATERAL Bilateral   . DILATION AND CURETTAGE OF UTERUS    . LEXISCAN MYOVIEW  05/13/2012   No ECG changes. EKG negative for ischemia. No significant ischemia demonstrated.  [name] MELANOMA EXCISION Left ~ 09/1979   "back of my shoulder"  . TEE WITHOUT CARDIOVERSION N/A 11/19/2016   Procedure: TRANSESOPHAGEAL ECHOCARDIOGRAM (TEE);  Surgeon: Lelon Perla, MD;  Location: [facility] ENDOSCOPY;  Service: Cardiovascular;  Laterality: N/A;  need CV, but no anesthesia available  . TEE WITHOUT CARDIOVERSION N/A 01/10/2017   Procedure: TRANSESOPHAGEAL ECHOCARDIOGRAM (TEE);  Surgeon: [name], MD;  Location: [facility] ENDOSCOPY;  Service: Cardiovascular;  Laterality: N/A;  . TRANSTHORACIC ECHOCARDIOGRAM  02/18/2012   EF >55%, mild aortic stenosis  . VAGINAL HYSTERECTOMY  1985    Current Outpatient Medications  Medication Sig Dispense Refill  . acetaminophen (TYLENOL) 500 MG tablet Take 500-1,000 mg by mouth every 6 (six) hours as needed for mild pain, moderate pain or headache.    . ALPRAZolam (XANAX) 0.5 MG tablet Take 1 tablet by mouth 2 (two) times daily.    [name] atorvastatin (LIPITOR) 40 MG tablet TAKE 1 TABLET (40 MG TOTAL) BY MOUTH DAILY. (Patient taking differently: take 20 mg three  days a week, MON, WED and FRI) 30 tablet 5  . buPROPion (WELLBUTRIN XL) 150 MG 24 hr tablet Take 2 tablets by mouth once daily    . Calcium Carbonate-Vitamin D (CALCIUM + D PO) Take 600 mg by mouth 2 (two) times daily.     . carvedilol (COREG) 6.25 MG tablet Take 1 tablet (6.25 mg total) by mouth 2 (two) times daily. 60 tablet 1  . cyanocobalamin (EQL VITAMIN B-12) 500 MCG tablet Take 1,000 mcg by  mouth daily.     [name] ELIQUIS 5 MG TABS tablet TAKE 1 TABLET BY MOUTH TWICE A DAY 180 tablet 1  . furosemide (LASIX) 80 MG tablet Take 40 mg by mouth daily.     [name] KLOR-CON M20 20 MEQ tablet TAKE 2 TABLETS (40 MEQ TOTAL) BY MOUTH 2 (TWO) TIMES DAILY. 360 tablet 1  . levothyroxine (SYNTHROID, LEVOTHROID) 88 MCG tablet Take 88 mcg by mouth daily before breakfast.    . Magnesium 250 MG TABS Take 2 tablets by mouth daily.     . Misc. Devices MISC by Does not apply route at bedtime. CPAP    . Multiple Vitamins-Minerals (PRESERVISION AREDS 2) CAPS Take 1 capsule by mouth 2 (two) times daily.    [name] omeprazole (PRILOSEC) 40 MG capsule Take 40 mg by mouth daily.    . potassium chloride SA (KLOR-CON M20) 20 MEQ tablet Take 2 tablets (40 mEq total) by mouth 2 (two) times daily. 360 tablet 1  . pyridOXINE (B-6) 50 MG tablet Take 50 mg by mouth daily.    [name] rOPINIRole (REQUIP) 0.5 MG tablet Take 2-3 daily at bedtime as needed 90 tablet 3  . meclizine (ANTIVERT) 25 MG tablet Take 1 tablet (25 mg total) by mouth 3 (three) times daily as needed for dizziness. (Patient not taking: Reported on 04/08/2018) 30 tablet 0   No current facility-administered medications for this encounter.     Allergies  Allergen Reactions  . Amoxicillin Nausea Only  . Crestor [Rosuvastatin] Other (See Comments)    Joints ached  . Lovastatin Other (See Comments)    Joints hurt all over body     Social History   Socioeconomic History  . Marital status: Widowed    Spouse name: Not on file  . Number of children: Not on file  . Years of education: Not on file  . Highest education level: Not on file  Occupational History  . Not on file  Social Needs  . Financial resource strain: Not on file  . Food insecurity:    Worry: Not on file    Inability: Not on file  . Transportation needs:    Medical: Not on file    Non-medical: Not on file  Tobacco Use  . Smoking status: Never Smoker  . Smokeless tobacco: Never Used  Substance and  Sexual Activity  . Alcohol use: No  . Drug use: Yes    Types: Cocaine  . Sexual activity: Never  Lifestyle  . Physical activity:    Days per week: Not on file    Minutes per session: Not on file  . Stress: Not on file  Relationships  . Social connections:    Talks on phone: Not on file    Gets together: Not on file    Attends religious service: Not on file    Active member of club or organization: Not on file    Attends meetings of clubs or organizations: Not on file    Relationship status:  Not on file  . Intimate partner violence:    Fear of current or ex partner: Not on file    Emotionally abused: Not on file    Physically abused: Not on file    Forced sexual activity: Not on file  Other Topics Concern  . Not on file  Social History Narrative  . Not on file    Family History  Problem Relation Age of Onset  . Hypertension Mother   . Diabetes Mother   . Heart failure Father   . Arrhythmia Sister   . Hyperlipidemia Sister   . Hypertension Sister   . Healthy Brother   . Stroke Paternal Grandmother   . Cancer Paternal Grandfather   . Healthy Daughter   . Hypertension Son   . Hyperlipidemia Son     ROS- All systems are reviewed and negative except as per the HPI above  Physical Exam: Vitals:   04/08/18 1447  BP: 128/84  Pulse: 98  Weight: 192 lb (87.1 kg)  Height: 5\' 5"  (1.651 m)   Wt Readings from Last 3 Encounters:  04/08/18 192 lb (87.1 kg)  10/13/17 192 lb 3.2 oz (87.2 kg)  07/28/17 188 lb (85.3 kg)    Labs: Lab Results  Component Value Date   NA 139 01/10/2017   K 3.7 01/10/2017   CL 96 (L) 01/10/2017   CO2 29 01/01/2017   GLUCOSE 117 (H) 01/10/2017   BUN 35 (H) 01/10/2017   CREATININE 1.80 (H) 01/10/2017   CALCIUM 9.7 01/01/2017   Lab Results  Component Value Date   INR 2.89 12/08/2016   Lab Results  Component Value Date   CHOL 170 02/11/2017   HDL 82 02/11/2017   LDLCALC 69 02/11/2017   TRIG 93 02/11/2017     GEN- The patient is  well appearing, alert and oriented x 3 today.   Head- normocephalic, atraumatic Eyes-  Sclera clear, conjunctiva pink Ears- hearing intact Oropharynx- clear Neck- supple, no JVP Lymph- no cervical lymphadenopathy Lungs- Clear to ausculation bilaterally, normal work of breathing Heart- Regular rate and rhythm, no murmurs, rubs or gallops, PMI not laterally displaced GI- soft, NT, ND, + BS Extremities- no clubbing, cyanosis, or edema MS- no significant deformity or atrophy Skin- no rash or lesion Psych- euthymic mood, full affect Neuro- strength and sensation are intact  EKG- Sinus brady at 08 bpm, LAFB Epic records reviewed Echo- July 2018-Study Conclusions  - Left ventricle: The cavity size was normal. Systolic function was   normal. The estimated ejection fraction was in the range of 60%   to 65%. Wall motion was normal; there were no regional wall   motion abnormalities. Doppler parameters are consistent with   abnormal left ventricular relaxation (grade 1 diastolic   dysfunction). There was no evidence of elevated ventricular   filling pressure by Doppler parameters. - Aortic valve: Functionally bicuspid; severely thickened, severely   calcified leaflets. There was moderate stenosis. There was   trivial regurgitation. Mean gradient (S): 27 mm Hg. Peak gradient   (S): 39 mm Hg. - Right atrium: The atrium was normal in size. - Tricuspid valve: There was no significant regurgitation. - Pulmonary arteries: Systolic pressure could not be accurately   estimated. - Inferior vena cava: The vessel was normal in size. - Pericardium, extracardiac: There was no pericardial effusion.   Assessment and Plan: 1. Persistent  afib s/p ablation and is doing well with no known afib burden Continue carvedilol bid Continue eliquis bid  2. NICM  EF recovered with return of SR   3. OSA Continue cpap  4. HTN Stable  f/u with Dr. Rayann Heman 10/2018 Dr. Debara Pickett  8/7 afib clinic as  needed  Geroge Baseman. Haseeb Fiallos, [facility] [address] [place], [place] [address] [phone]

## 2018-04-27 ENCOUNTER — Ambulatory Visit (INDEPENDENT_AMBULATORY_CARE_PROVIDER_SITE_OTHER): Payer: Medicare Other | Admitting: Physician Assistant

## 2018-04-27 DIAGNOSIS — M1711 Unilateral primary osteoarthritis, right knee: Secondary | ICD-10-CM

## 2018-04-27 MED ORDER — LIDOCAINE HCL 1 % IJ SOLN
5.0000 mL | INTRAMUSCULAR | Status: AC | PRN
  Administered 2018-04-27: 5 mL

## 2018-04-27 MED ORDER — METHYLPREDNISOLONE ACETATE 40 MG/ML IJ SUSP
40.0000 mg | INTRAMUSCULAR | Status: AC | PRN
  Administered 2018-04-27: 40 mg via INTRA_ARTICULAR

## 2018-04-27 NOTE — Progress Notes (Signed)
   Procedure Note  Patient: Jenna [name]             Date of Birth: January 23, 1941           MRN: [id number]             Visit Date: 04/27/2018 HPI: Mrs. Karlene Einstein returns today complaining of right knee pain.  She had a Monovisc injection in her right knee on 03/30/2018 and states that she is gotten no relief from it.  She is wearing a knee sleeve.  She does have bone-on-bone-like pain in the knee.  She is wondering what other treatments that she can have for this.  Again she has bone-on-bone arthritis knee.  She ambulates with a cane.  She is taking tramadol for her back and this helps with the knee some.  Physical exam: Right knee no abnormal warmth no erythema.  Positive effusion.  Positive crepitus with range of motion.  Overall good range of motion of the knee. Procedures: Visit Diagnoses: Primary osteoarthritis of right knee  Large Joint Inj on 04/27/2018 4:48 PM Indications: pain Details: 22 G 1.5 in needle, anterolateral approach  Arthrogram: No  Medications: 40 mg methylPREDNISolone acetate 40 MG/ML; 5 mL lidocaine 1 % Outcome: tolerated well, no immediate complications Procedure, treatment alternatives, risks and benefits explained, specific risks discussed. Consent was given by the patient. Immediately prior to procedure a time out was called to verify the correct patient, procedure, equipment, support staff and site/side marked as required. Patient was prepped and draped in the usual sterile fashion.     Plan: She will follow-up on an as-needed basis.  She understands that she can only have cortisone injections every 3 months.  Would not  recommend repeat Monovisc injection as she is had no relief with this.  Questions encouraged and answered at length.

## 2018-05-13 ENCOUNTER — Ambulatory Visit: Payer: Medicare Other | Admitting: Internal Medicine

## 2018-06-22 ENCOUNTER — Ambulatory Visit: Payer: Medicare Other | Admitting: Internal Medicine

## 2018-06-22 ENCOUNTER — Encounter: Payer: Self-pay | Admitting: Internal Medicine

## 2018-06-22 VITALS — BP 134/76 | HR 81 | Ht 65.5 in | Wt 200.8 lb

## 2018-06-22 DIAGNOSIS — Z9889 Other specified postprocedural states: Secondary | ICD-10-CM

## 2018-06-22 DIAGNOSIS — I10 Essential (primary) hypertension: Secondary | ICD-10-CM

## 2018-06-22 DIAGNOSIS — Z8679 Personal history of other diseases of the circulatory system: Secondary | ICD-10-CM | POA: Diagnosis not present

## 2018-06-22 DIAGNOSIS — I480 Paroxysmal atrial fibrillation: Secondary | ICD-10-CM | POA: Diagnosis not present

## 2018-06-22 NOTE — Progress Notes (Signed)
OFFICE NOTE  Chief Complaint:   Swelling  Primary Care Physician: Chesley Noon, MD  HPI:  Jenna [name] is a 77 year old female with a history of mild aortic stenosis and a negative nuclear stress test in August 2013. Recently she had an episode of palpitations which she felt lasted up to about 10 minutes. During that time she felt a decrease in exercise tolerance with fatigue and no energy. This was concerning for AFib; however, she wore a monitor for a short period of time and it did not show that.  I set her up for a 30-day monitor, which she worse very faithfully between February 26 and December 31, 2012. The results indicated 1 very brief episode of AFib of less than 10 seconds in duration out of a total of over 40,000 seconds of monitoring. This correlates with a burden of less than 0.003%. At this point, given her low burden I feel that it is still reasonable to treat her with low-dose aspirin. However, we will continue to see her closely and adjust her anticoagulation as necessary. Since her last follow-up she denies any significant palpitations. She has reported some LE swelling, which she thinks is better now that she is off of her ARB?  Jenna [name] returns today for followup. She reports doing fairly well. In August she said she had an episode of age of fibrillation when coming home from the beach. That day she walked up and down the stairs several times and felt her heart racing. It never seemed to slow down. Finally she felt better the next day however she was fatigued and eventually recovered. Since that time she's had no further events. She does get some mild swelling in her legs. She is reporting some fatigue since starting a beta blocker and feels that it may be a side effect.  I saw Jenna [name] back today in the office. She seems to doing fairly well from a cardiac standpoint. She denies any significant palpitations and thinks that the diltiazem is generally controlled them. If she  is having A. fib is very infrequent and a low burden. She is maintained only on aspirin. Unfortunate she's been struggling with depression which occurred after grieving for the death of her husband. She's been apparently on several different antidepressive medicines with mixed results and recently on Wellbutrin which she thinks is somewhat helpful. She did have somewhat of a flat affect in the office today.  07/08/2016  Jenna [name] returns today for follow-up. She is doing generally well and denies any palpitations of significance. EKG today shows sinus rhythm at 72. Her blood pressure is mildly elevated 141/79. Weight is up a few pounds. She continues to struggle with depression related to the death of her husband which is now a couple of years ago.  05/13/2017  Jenna [name] returns today for follow-up. Recently she was hospitalized in March with A. fib with RVR and symptoms of heart failure. Her diuretics were increased and she underwent cardioversion. This was initially successful, however ultimately she went back into A. fib. She was seen by EP and was placed on amiodarone and Ranexa. She was later evaluated and deemed a candidate for ablation. I performed a cardiac transesophageal echocardiogram which demonstrated an LVEF of 20-25% and no evidence of LV thrombus. Subsequently she underwent catheter ablation with successful conversion to sinus rhythm. Since that time she is maintaining sinus rhythm and a repeat echocardiogram on 04/22/2017 demonstrates normalization of LV function to an LVEF of 60-65%.  She reports that she's been feeling well. She's been worked with her primary care provider who recently obtained labs and indicated she may be a little dehydrated. She was on high-dose Lasix 80 mg daily daily which was reduced to 40 mg alternating with 80 mg every other day. She denies any worsening shortness of breath or chest pain. Blood pressure is at goal today. Her weight is been stable. She is compliant with  Eliquis for anticoagulation and denies any bleeding problems.  06/22/2018  Jenna [name] returns today for follow-up.  She reports some recent increase in swelling.  She denies any recurrent A. fib.  She was seen in the A. fib clinic in July and had not noted to have any A. fib at the time.  Fortunately her LVEF had normalized after ablation.  She denies any chest pain.  She continues to struggle with hip pain.  She says she has arthritis which is "bone-on-bone".  PMHx:  Past Medical History:  Diagnosis Date  . Anxiety   . Chronic lower back pain   . Chronic systolic dysfunction of left ventricle   . Depression   . Fatigue   . GERD (gastroesophageal reflux disease)   . High cholesterol   . Hypertension   . Hypothyroidism   . Melanoma of shoulder, left ([place])   . Mild aortic stenosis   . Nonischemic cardiomyopathy ([place])   . OSA on CPAP    reports compliance with CPAP  . Osteoarthritis   . Persistent atrial fibrillation with RVR ([place])    [name] 11/15/2016  . RLS (restless legs syndrome)   . S/P ablation of atrial flutter/ fib 01/10/17 01/11/2017  . Type II diabetes mellitus ([place])     Past Surgical History:  Procedure Laterality Date  . ATRIAL FIBRILLATION ABLATION N/A 01/10/2017   Procedure: Atrial Fibrillation Ablation;  Surgeon: Thompson Grayer, MD;  Location: [facility];  Service: Cardiovascular;  Laterality: N/A;  . CARDIOVERSION N/A 11/19/2016   Procedure: CARDIOVERSION;  Surgeon: Lelon Perla, MD;  Location: [facility] ENDOSCOPY;  Service: Cardiovascular;  Laterality: N/A;  . CARDIOVERSION N/A 12/11/2016   Procedure: CARDIOVERSION;  Surgeon: Dorothy Spark, MD;  Location: [facility] ENDOSCOPY;  Service: Cardiovascular;  Laterality: N/A;  . CARDIOVERSION N/A 12/18/2016   Procedure: CARDIOVERSION;  Surgeon: Skeet Latch, MD;  Location: [place];  Service: Cardiovascular;  Laterality: N/A;  . CATARACT EXTRACTION W/ INTRAOCULAR LENS  IMPLANT, BILATERAL Bilateral   . DILATION AND CURETTAGE OF  UTERUS    . LEXISCAN MYOVIEW  05/13/2012   No ECG changes. EKG negative for ischemia. No significant ischemia demonstrated.  [name] MELANOMA EXCISION Left ~ 09/1979   "back of my shoulder"  . TEE WITHOUT CARDIOVERSION N/A 11/19/2016   Procedure: TRANSESOPHAGEAL ECHOCARDIOGRAM (TEE);  Surgeon: Lelon Perla, MD;  Location: [facility] ENDOSCOPY;  Service: Cardiovascular;  Laterality: N/A;  need CV, but no anesthesia available  . TEE WITHOUT CARDIOVERSION N/A 01/10/2017   Procedure: TRANSESOPHAGEAL ECHOCARDIOGRAM (TEE);  Surgeon: [name], MD;  Location: [facility] ENDOSCOPY;  Service: Cardiovascular;  Laterality: N/A;  . TRANSTHORACIC ECHOCARDIOGRAM  02/18/2012   EF >55%, mild aortic stenosis  . VAGINAL HYSTERECTOMY  1985    FAMHx:  Family History  Problem Relation Age of Onset  . Hypertension Mother   . Diabetes Mother   . Heart failure Father   . Arrhythmia Sister   . Hyperlipidemia Sister   . Hypertension Sister   . Healthy Brother   . Stroke Paternal Grandmother   .  Cancer Paternal Grandfather   . Healthy Daughter   . Hypertension Son   . Hyperlipidemia Son     SOCHx:   reports that she has never smoked. She has never used smokeless tobacco. She reports that she has current or past drug history. Drug: Cocaine. She reports that she does not drink alcohol.  ALLERGIES:  Allergies  Allergen Reactions  . Amoxicillin Nausea Only  . Crestor [Rosuvastatin] Other (See Comments)    Joints ached  . Lovastatin Other (See Comments)    Joints hurt all over body     ROS: Pertinent items noted in HPI and remainder of comprehensive ROS otherwise negative.  HOME MEDS: Current Outpatient Medications  Medication Sig Dispense Refill  . acetaminophen (TYLENOL) 500 MG tablet Take 500-1,000 mg by mouth every 6 (six) hours as needed for mild pain, moderate pain or headache.    . ALPRAZolam (XANAX) 0.5 MG tablet Take 1 tablet by mouth 2 (two) times daily.    [name] atorvastatin (LIPITOR) 40 MG tablet TAKE 1  TABLET (40 MG TOTAL) BY MOUTH DAILY. (Patient taking differently: take 20 mg three days a week, MON, WED and FRI) 30 tablet 5  . buPROPion (WELLBUTRIN XL) 150 MG 24 hr tablet Take 2 tablets by mouth once daily    . Calcium Carbonate-Vitamin D (CALCIUM + D PO) Take 600 mg by mouth 2 (two) times daily.     . carvedilol (COREG) 6.25 MG tablet Take 1 tablet (6.25 mg total) by mouth 2 (two) times daily. 60 tablet 1  . cyanocobalamin (EQL VITAMIN B-12) 500 MCG tablet Take 1,000 mcg by mouth daily.     [name] ELIQUIS 5 MG TABS tablet TAKE 1 TABLET BY MOUTH TWICE A DAY 180 tablet 1  . furosemide (LASIX) 80 MG tablet Take 40 mg by mouth daily.     [name] glucose blood (ONE TOUCH ULTRA TEST) test strip USE TO TEST BLOOD SUGAR TWICE DAILY    . levothyroxine (SYNTHROID, LEVOTHROID) 88 MCG tablet Take 88 mcg by mouth daily before breakfast.    . Magnesium 250 MG TABS Take 2 tablets by mouth daily.     . Misc. Devices MISC by Does not apply route at bedtime. CPAP    . Multiple Vitamins-Minerals (PRESERVISION AREDS 2) CAPS Take 1 capsule by mouth 2 (two) times daily.    [name] omeprazole (PRILOSEC) 40 MG capsule Take 40 mg by mouth daily.    . ONE TOUCH ULTRA TEST test strip USE TO TEST BLOOD SUGAR TWICE DAILY  1  . potassium chloride SA (KLOR-CON M20) 20 MEQ tablet Take 2 tablets (40 mEq total) by mouth 2 (two) times daily. 360 tablet 1  . pyridOXINE (B-6) 50 MG tablet Take 50 mg by mouth daily.    [name] rOPINIRole (REQUIP) 0.5 MG tablet Take 2-3 daily at bedtime as needed 90 tablet 3  . traMADol (ULTRAM) 50 MG tablet TAKE 1 TABLET (50 MG TOTAL) BY MOUTH EVERY SIX (6) HOURS AS NEEDED.  2   No current facility-administered medications for this visit.     LABS/IMAGING: No results found for this or any previous visit (from the past 48 hour(s)). No results found.  VITALS: BP 134/76 (BP Location: Left Arm, Patient Position: Sitting, Cuff Size: Large)   Pulse 81   Ht 5' 5.5" (1.664 m)   Wt 200 lb 12.8 oz (91.1 kg)   BMI  32.91 kg/m   EXAM: General appearance: alert and no distress Neck: no carotid bruit, no JVD  and thyroid not enlarged, symmetric, no tenderness/mass/nodules Lungs: clear to auscultation bilaterally Heart: regular rate and rhythm, S1, S2 normal and systolic murmur: early systolic 2/6, crescendo at 2nd right intercostal space Abdomen: soft, non-tender; bowel sounds normal; no masses,  no organomegaly Extremities: extremities normal, atraumatic, no cyanosis or edema Pulses: 2+ and symmetric Skin: Skin color, texture, turgor normal. No rashes or lesions Neurologic: Grossly normal Psych: Pleasant  EKG: Sinus rhythm 81-personally reviewed  ASSESSMENT: 1. Paroxysmal atrial fibrillation, s/p catheter ablation (01/2017) - [id number] on Eliquis 2. 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 HTN - controlled 4. Dyslipidemia - not on a statin, intolerant to Crestor and lovastatin 5. Mild aortic stenosis - unchanged by exam 6. History of depression  PLAN: 1.  Mrs. Campoy needs to do well after A. fib ablation.  She is tolerating Eliquis without any significant bleeding problems.  She did have some nosebleeding but attributed this to fish oil which she stopped.  Blood pressures well controlled.  LVEF improved to 6065% in 2018.  She is struggling with hip pain and may need surgery.  She does have mild aortic stenosis which will follow clinically by echo.  Plan follow-up annually or sooner as necessary.  [name], MD, [facility], [place] Director of the [facility] Lipid Disorders &  Cardiovascular Risk Reduction Clinic Diplomate of the American Board of Clinical Lipidology Attending Cardiologist  Direct Dial: [phone]  Fax: [phone]  Website:  [email or web address] 06/22/2018, 2:32 PM

## 2018-06-22 NOTE — Patient Instructions (Signed)
Your physician wants you to follow-up in ONE YEAR with Dr. Debara Pickett. You will receive a reminder email in [place] when it is time to schedule an appointment. Please call our office once you receive your reminder to schedule an appointment at your convenience.

## 2018-06-29 ENCOUNTER — Encounter (INDEPENDENT_AMBULATORY_CARE_PROVIDER_SITE_OTHER): Payer: Self-pay | Admitting: Physician Assistant

## 2018-06-29 ENCOUNTER — Other Ambulatory Visit: Payer: Self-pay | Admitting: Internal Medicine

## 2018-06-29 ENCOUNTER — Ambulatory Visit (INDEPENDENT_AMBULATORY_CARE_PROVIDER_SITE_OTHER): Payer: Medicare Other | Admitting: Physician Assistant

## 2018-06-29 DIAGNOSIS — M1711 Unilateral primary osteoarthritis, right knee: Secondary | ICD-10-CM

## 2018-06-29 NOTE — Progress Notes (Signed)
Office Visit Note   Patient: Jenna [name]           Date of Birth: [birth year]           MRN: [id number] Visit Date: 06/29/2018              Requested by: Chesley Noon, MD [place], [place] [id number] PCP: Chesley Noon, MD   Assessment & Plan: Visit Diagnoses:  1. Primary osteoarthritis of right knee     Plan: Due to the fact the patient has continued right knee pain is affecting her daily living and is failed conservative treatment which included injections including supplemental injection, assistive device, pain medication and time recommend right total knee arthroplasty.  Patient wishes to proceed with a right total knee arthroplasty in the near future.  However due to her significant cardiac history she will need clearance by her cardiologist Dr. Debara Pickett who she just saw a few days ago. Right total knee arthroplasty surgery was discussed with the patient at length also discussed the postoperative protocol.  Knee model with total knee components used for visualization purposes and talking with the patient today.  Risk including but not limited to DVT/PE, wound infection, prolonged pain, worsening pain, blood loss,and nerve/vessel injury all discussed with patient at length.  She will follow-up 2 weeks postop.   Follow-Up Instructions: Return for 2 weeks post op.   Orders:  No orders of the defined types were placed in this encounter.  No orders of the defined types were placed in this encounter.     Procedures: No procedures performed   Clinical Data: No additional findings.   Subjective: Chief Complaint  Patient presents with  . Right Knee - Pain    HPI Jenna [name] returns today complaining of right knee pain.  She continues to have pain in her right knee despite Monovisc injections cortisone injections use of a cane.  She wants to discuss right total knee surgery today.  She does have a significant cardiac history is recently seen her cardiologist  who she states said that she was cleared for surgery.  She is rate controlled A. fib at this point time.  She is on Eliquis. Review of Systems She denies any fevers ,chills, chest pain or shortness of breath.  Objective: Vital Signs: There were no vitals taken for this visit.  Physical Exam  Constitutional: She is oriented to person, place, and time. She appears well-developed and well-nourished. No distress.  Pulmonary/Chest: Effort normal.  Neurological: She is oriented to person, place, and time.  Skin: She is not diaphoretic.  Psychiatric: She has a normal mood and affect.    Ortho Exam Right knee good range of motion positive crepitus. Specialty Comments:  No specialty comments available.  Imaging: No results found.   PMFS History: Patient Active Problem List   Diagnosis Date Noted  . Primary osteoarthritis of right knee 12/08/2017  . Nonischemic cardiomyopathy ([place]) 05/14/2017  . S/P ablation of atrial flutter/ fib 01/10/17 01/11/2017  . A-fib ([place]) 01/10/2017  . Weakness 12/17/2016  . Acute kidney injury superimposed on chronic kidney disease ([place]) 12/17/2016  . Hyperglycemia 12/17/2016  . Normocytic anemia 12/17/2016  . Acute kidney injury ([place]) 12/08/2016  . Acute on chronic systolic congestive heart failure ([place]) 12/08/2016  . Hyperkalemia 12/08/2016  . Cystitis   . Persistent atrial fibrillation ([place]) 11/15/2016  . Depressed 07/04/2015  . Peripheral vertigo 12/16/2014  . Vertigo 12/16/2014  . Aortic valve stenosis 07/26/2013  .  HTN (hypertension) 07/26/2013  . Hypothyroidism 07/26/2013  . PAF (paroxysmal atrial fibrillation) ([place]) 07/26/2013  . Anxiety 07/26/2013  . Aortic valve disorder 07/26/2013  . GERD (gastroesophageal reflux disease) 07/26/2013  . OA (osteoarthritis) 07/26/2013  . Obstructive sleep apnea 06/20/2008  . RESTLESS LEGS SYNDROME 06/20/2008   Past Medical History:  Diagnosis Date  . Anxiety   . Chronic lower back pain   . Chronic  systolic dysfunction of left ventricle   . Depression   . Fatigue   . GERD (gastroesophageal reflux disease)   . High cholesterol   . Hypertension   . Hypothyroidism   . Melanoma of shoulder, left ([place])   . Mild aortic stenosis   . Nonischemic cardiomyopathy ([place])   . OSA on CPAP    reports compliance with CPAP  . Osteoarthritis   . Persistent atrial fibrillation with RVR ([place])    [name] 11/15/2016  . RLS (restless legs syndrome)   . S/P ablation of atrial flutter/ fib 01/10/17 01/11/2017  . Type II diabetes mellitus ([facility])     Family History  Problem Relation Age of Onset  . Hypertension Mother   . Diabetes Mother   . Heart failure Father   . Arrhythmia Sister   . Hyperlipidemia Sister   . Hypertension Sister   . Healthy Brother   . Stroke Paternal Grandmother   . Cancer Paternal Grandfather   . Healthy Daughter   . Hypertension Son   . Hyperlipidemia Son     Past Surgical History:  Procedure Laterality Date  . ATRIAL FIBRILLATION ABLATION N/A 01/10/2017   Procedure: Atrial Fibrillation Ablation;  Surgeon: Thompson Grayer, MD;  Location: [facility];  Service: Cardiovascular;  Laterality: N/A;  . CARDIOVERSION N/A 11/19/2016   Procedure: CARDIOVERSION;  Surgeon: Lelon Perla, MD;  Location: [facility] ENDOSCOPY;  Service: Cardiovascular;  Laterality: N/A;  . CARDIOVERSION N/A 12/11/2016   Procedure: CARDIOVERSION;  Surgeon: Dorothy Spark, MD;  Location: [facility]COPY;  Service: Cardiovascular;  Laterality: N/A;  . CARDIOVERSION N/A 12/18/2016   Procedure: CARDIOVERSION;  Surgeon: Skeet Latch, MD;  Location: [facility];  Service: Cardiovascular;  Laterality: N/A;  . CATARACT EXTRACTION W/ INTRAOCULAR LENS  IMPLANT, BILATERAL Bilateral   . DILATION AND CURETTAGE OF UTERUS    . LEXISCAN MYOVIEW  05/13/2012   No ECG changes. EKG negative for ischemia. No significant ischemia demonstrated.  [name] MELANOMA EXCISION Left ~ 09/1979   "back of my shoulder"  . TEE WITHOUT  CARDIOVERSION N/A 11/19/2016   Procedure: TRANSESOPHAGEAL ECHOCARDIOGRAM (TEE);  Surgeon: Lelon Perla, MD;  Location: [facility] ENDOSCOPY;  Service: Cardiovascular;  Laterality: N/A;  need CV, but no anesthesia available  . TEE WITHOUT CARDIOVERSION N/A 01/10/2017   Procedure: TRANSESOPHAGEAL ECHOCARDIOGRAM (TEE);  Surgeon: [name], MD;  Location: [facility] ENDOSCOPY;  Service: Cardiovascular;  Laterality: N/A;  . TRANSTHORACIC ECHOCARDIOGRAM  02/18/2012   EF >55%, mild aortic stenosis  . VAGINAL HYSTERECTOMY  1985   Social History   Occupational History  . Not on file  Tobacco Use  . Smoking status: Never Smoker  . Smokeless tobacco: Never Used  Substance and Sexual Activity  . Alcohol use: No  . Drug use: Yes    Types: Cocaine  . Sexual activity: Never

## 2018-07-05 ENCOUNTER — Other Ambulatory Visit: Payer: Self-pay | Admitting: Internal Medicine

## 2018-07-10 ENCOUNTER — Telehealth: Payer: Self-pay | Admitting: *Deleted

## 2018-07-10 NOTE — Telephone Encounter (Signed)
   [facility] Pre-operative Risk Assessment    Request for surgical clearance:  1. What type of surgery is being performed? Right TKA  2. When is this surgery scheduled? TBD pending clearance  3. What type of clearance is required (medical clearance vs. Pharmacy clearance to hold med vs. Both)? Both  4. Are there any medications that need to be held prior to surgery and how long? Pending Provider Advisory  5. Practice name and name of physician performing surgery? [facility], Zollie Beckers, MD  6. What is your office phone number [phone]   7.   What is your office fax number [phone]  8.   Anesthesia type (None, local, MAC, general) ? Pending Provider Advisory    Jonnie Kubly 07/10/2018, 3:06 PM  _________________________________________________________________   (provider comments below)

## 2018-07-10 NOTE — Telephone Encounter (Signed)
   Primary Cardiologist: [name], MD  Chart reviewed as part of pre-operative protocol coverage. Given past medical history and time since last visit, based on ACC/AHA guidelines, Jenna [name] would be at acceptable risk for the planned procedure without further cardiovascular testing.   Ok to hold Eliquis 3 days pre op.   I will route this recommendation to the requesting party via Epic fax function and remove from pre-op pool.  Please call with questions.  Kerin Ransom, PA-C 07/10/2018, 4:45 PM

## 2018-07-10 NOTE — Telephone Encounter (Signed)
Pt takes Eliquis for afib with CHADS2VASc score of 6 (age x2, sex, HTN, CHF, DM). CrCl 84mL/min using actual body weight and SCr of 1.13 drawn 03/26/18 in Care Everywhere. Ok to hold Eliquis for 3 days prior to TKA per protocol.

## 2018-07-20 ENCOUNTER — Other Ambulatory Visit: Payer: Self-pay

## 2018-07-20 ENCOUNTER — Encounter (HOSPITAL_COMMUNITY)
Admission: RE | Admit: 2018-07-20 | Discharge: 2018-07-20 | Disposition: A | Discharge status: Home / Self Care | Payer: Medicare Other | Source: Ambulatory Visit | Attending: Orthopaedic Surgery | Admitting: Orthopaedic Surgery

## 2018-07-20 ENCOUNTER — Encounter (HOSPITAL_COMMUNITY): Payer: Self-pay

## 2018-07-20 ENCOUNTER — Other Ambulatory Visit (INDEPENDENT_AMBULATORY_CARE_PROVIDER_SITE_OTHER): Payer: Self-pay | Admitting: Physician Assistant

## 2018-07-20 DIAGNOSIS — K219 Gastro-esophageal reflux disease without esophagitis: Secondary | ICD-10-CM | POA: Diagnosis not present

## 2018-07-20 DIAGNOSIS — G4733 Obstructive sleep apnea (adult) (pediatric): Secondary | ICD-10-CM | POA: Diagnosis not present

## 2018-07-20 DIAGNOSIS — Z01812 Encounter for preprocedural laboratory examination: Secondary | ICD-10-CM | POA: Insufficient documentation

## 2018-07-20 DIAGNOSIS — I4891 Unspecified atrial fibrillation: Secondary | ICD-10-CM | POA: Diagnosis not present

## 2018-07-20 DIAGNOSIS — I10 Essential (primary) hypertension: Secondary | ICD-10-CM | POA: Insufficient documentation

## 2018-07-20 DIAGNOSIS — I350 Nonrheumatic aortic (valve) stenosis: Secondary | ICD-10-CM | POA: Diagnosis not present

## 2018-07-20 DIAGNOSIS — E785 Hyperlipidemia, unspecified: Secondary | ICD-10-CM | POA: Diagnosis not present

## 2018-07-20 DIAGNOSIS — M1991 Primary osteoarthritis, unspecified site: Secondary | ICD-10-CM | POA: Diagnosis not present

## 2018-07-20 HISTORY — DX: Heart failure, unspecified: I50.9

## 2018-07-20 HISTORY — DX: Other specified postprocedural states: Z98.890

## 2018-07-20 HISTORY — DX: Nausea with vomiting, unspecified: R11.2

## 2018-07-20 HISTORY — DX: Cardiac arrhythmia, unspecified: I49.9

## 2018-07-20 LAB — BASIC METABOLIC PANEL
Anion gap: 9 (ref 5–15)
BUN: 24 mg/dL — ABNORMAL HIGH (ref 8–23)
CO2: 30 mmol/L (ref 22–32)
Calcium: 8.9 mg/dL (ref 8.9–10.3)
Chloride: 100 mmol/L (ref 98–111)
Creatinine, Ser: 0.95 mg/dL (ref 0.44–1.00)
GFR calc Af Amer: >60 mL/min (ref >60)
GFR calc non Af Amer: 57 mL/min — ABNORMAL LOW (ref >60)
Glucose, Bld: 80 mg/dL (ref 70–99)
Potassium: 4.2 mmol/L (ref 3.5–5.1)
Sodium: 139 mmol/L (ref 135–145)

## 2018-07-20 LAB — CBC
HCT: 40.2 % (ref 36.0–46.0)
Hemoglobin: 12.8 g/dL (ref 12.0–15.0)
MCH: 29.1 pg (ref 26.0–34.0)
MCHC: 31.8 g/dL (ref 30.0–36.0)
MCV: 91.4 fL (ref 80.0–100.0)
Platelets: 339 10*3/uL (ref 150–400)
RBC: 4.4 MIL/uL (ref 3.87–5.11)
RDW: 13.3 % (ref 11.5–15.5)
WBC: 6.6 10*3/uL (ref 4.0–10.5)
nRBC: 0 % (ref 0.0–0.2)

## 2018-07-20 LAB — SURGICAL PCR SCREEN
MRSA, PCR: NEGATIVE
Staphylococcus aureus: POSITIVE — AB

## 2018-07-20 NOTE — Progress Notes (Addendum)
Cardiac clearance note in Epic 07-10-2018  Last OV with Dr. Debara Pickett  06-22-18  Echo  -04-22-2017  Stress test  05-13-2017  IB message sent to Dr. Ninfa Linden for orders.

## 2018-07-20 NOTE — Progress Notes (Signed)
Mupirocin Ointment Rx [facility] [place] for positive PCR of Staph. Pt notified of results and need to pick up Rx.

## 2018-07-20 NOTE — Pre-Procedure Instructions (Addendum)
Jenna [name]  07/20/2018      [id number] [place], [place] - [address] [place] [address] Phone: [phone] Fax: [phone]    Your procedure is scheduled on 07-28-2018  Tuesday   Report to [facility] Admitting at  12 noon.  Call this number if you have problems the morning of surgery:  [phone]   Remember:  Do not eat or drink after midnight.                           Take these medicines the morning of surgery with A SIP OF WATER  Tylenol if needed Alprazolam(Xanax) Bupropion(Wellbutrin) Carvedilol(Coreg) Levothyroxine(Synthroid) Omeprazole(Prilosec) Potassium chloride (Klor-Con) Ropinirole(Requip)   Stop Eliquis 3 days prior to surgery as per instructions.  STOP TAKING ANY ASPIRIN (UNLESS OTHERWISE INSTRUCTED BY YOUR SURGEON),ANTIINFLAMATORIES (IBUPROFEN,ALEVE,MOTRIN,ADVIL,GOODY'S POWDERS),HERBAL SUPPLEMENTS,FISH OIL,AND VITAMINS 5-7 DAYS PRIOR TO SURGERY       Do not wear jewelry, make-up or nail polish.  Do not wear lotions, powders, or perfumes, or deodorant.  Do not shave 48 hours prior to surgery.  Men may shave face and neck.  Do not bring valuables to the hospital.  [facility] is not responsible for any belongings or valuables.  Contacts, dentures or bridgework may not be worn into surgery.  Leave your suitcase in the car.  After surgery it may be brought to your room.  For patients admitted to the hospital, discharge time will be determined by your treatment team.  Patients discharged the day of surgery will not be allowed to drive home.      [facility] - Preparing for Surgery  Before surgery, you can play an important role.  Because skin is not sterile, your skin needs to be as free of germs as possible.  You can reduce the number of germs on you skin by washing with CHG (chlorahexidine gluconate) soap before surgery.  CHG is an antiseptic cleaner  which kills germs and bonds with the skin to continue killing germs even after washing.  Oral Hygiene is also important in reducing the risk of infection.  Remember to brush your teeth with your regular toothpaste the morning of surgery.  Please DO NOT use if you have an allergy to CHG or antibacterial soaps.  If your skin becomes reddened/irritated stop using the CHG and inform your nurse when you arrive at Short Stay.  Do not shave (including legs and underarms) for at least 48 hours prior to the first CHG shower.  You may shave your face.  Please follow these instructions carefully:   1.  Shower with CHG Soap the night before surgery and the morning of Surgery.  2.  If you choose to wash your hair, wash your hair first as usual with your normal shampoo.  3.  After you shampoo, rinse your hair and body thoroughly to remove the shampoo. 4.  Use CHG as you would any other liquid soap.  You can apply chg directly to the skin and wash gently with a      scrungie or washcloth.           5.  Apply the CHG Soap to your body ONLY FROM THE NECK DOWN.   Do not use on open wounds or open sores. Avoid contact with your eyes, ears, mouth and genitals (private parts).  Wash genitals (private parts) with  your normal soap.  6.  Wash thoroughly, paying special attention to the area where your surgery will be performed.  7.  Thoroughly rinse your body with warm water from the neck down.  8.  DO NOT shower/wash with your normal soap after using and rinsing off the CHG Soap.  9.  Pat yourself dry with a clean towel.            10.  Wear clean pajamas.            11.  Place clean sheets on your bed the night of your first shower and do not sleep with pets.  Day of Surgery  Do not apply any lotions/deoderants the morning of surgery.   Please wear clean clothes to the hospital/surgery center. Remember to brush your teeth with toothpaste.    Please read over the following fact sheets that you were  given. Pain Booklet, Coughing and Deep Breathing and Surgical Site Infection Prevention

## 2018-07-22 ENCOUNTER — Other Ambulatory Visit (INDEPENDENT_AMBULATORY_CARE_PROVIDER_SITE_OTHER): Payer: Self-pay

## 2018-07-27 MED ORDER — TRANEXAMIC ACID-NACL 1000-0.7 MG/100ML-% IV SOLN
1000.0000 mg | INTRAVENOUS | Status: AC
  Administered 2018-07-28: 1000 mg via INTRAVENOUS
  Filled 2018-07-27: qty 100

## 2018-07-28 ENCOUNTER — Ambulatory Visit: Payer: Medicare Other | Admitting: Adult Health

## 2018-07-28 ENCOUNTER — Encounter (HOSPITAL_COMMUNITY)
Admission: RE | Disposition: A | Discharge status: Skilled Nursing Facility | Payer: Self-pay | Source: Ambulatory Visit | Attending: Orthopaedic Surgery

## 2018-07-28 ENCOUNTER — Other Ambulatory Visit: Payer: Self-pay

## 2018-07-28 ENCOUNTER — Ambulatory Visit (HOSPITAL_COMMUNITY): Payer: Medicare Other | Admitting: Anesthesiology

## 2018-07-28 ENCOUNTER — Observation Stay (HOSPITAL_COMMUNITY)
Admission: RE | Admit: 2018-07-28 | Discharge: 2018-08-03 | Disposition: A | Discharge status: Skilled Nursing Facility | Payer: Medicare Other | Source: Ambulatory Visit | Attending: Orthopaedic Surgery | Admitting: Orthopaedic Surgery

## 2018-07-28 ENCOUNTER — Encounter (HOSPITAL_COMMUNITY): Payer: Self-pay

## 2018-07-28 ENCOUNTER — Inpatient Hospital Stay (HOSPITAL_COMMUNITY): Payer: Medicare Other

## 2018-07-28 DIAGNOSIS — Z7902 Long term (current) use of antithrombotics/antiplatelets: Secondary | ICD-10-CM | POA: Diagnosis not present

## 2018-07-28 DIAGNOSIS — F419 Anxiety disorder, unspecified: Secondary | ICD-10-CM | POA: Diagnosis not present

## 2018-07-28 DIAGNOSIS — E1122 Type 2 diabetes mellitus with diabetic chronic kidney disease: Secondary | ICD-10-CM | POA: Insufficient documentation

## 2018-07-28 DIAGNOSIS — Z79899 Other long term (current) drug therapy: Secondary | ICD-10-CM | POA: Insufficient documentation

## 2018-07-28 DIAGNOSIS — M1711 Unilateral primary osteoarthritis, right knee: Secondary | ICD-10-CM | POA: Diagnosis not present

## 2018-07-28 DIAGNOSIS — I5023 Acute on chronic systolic (congestive) heart failure: Secondary | ICD-10-CM | POA: Diagnosis not present

## 2018-07-28 DIAGNOSIS — N179 Acute kidney failure, unspecified: Secondary | ICD-10-CM | POA: Insufficient documentation

## 2018-07-28 DIAGNOSIS — I130 Hypertensive heart and chronic kidney disease with heart failure and stage 1 through stage 4 chronic kidney disease, or unspecified chronic kidney disease: Secondary | ICD-10-CM | POA: Insufficient documentation

## 2018-07-28 DIAGNOSIS — E7800 Pure hypercholesterolemia, unspecified: Secondary | ICD-10-CM | POA: Insufficient documentation

## 2018-07-28 DIAGNOSIS — Z7984 Long term (current) use of oral hypoglycemic drugs: Secondary | ICD-10-CM | POA: Diagnosis not present

## 2018-07-28 DIAGNOSIS — Z85820 Personal history of malignant melanoma of skin: Secondary | ICD-10-CM | POA: Diagnosis not present

## 2018-07-28 DIAGNOSIS — I480 Paroxysmal atrial fibrillation: Secondary | ICD-10-CM | POA: Diagnosis not present

## 2018-07-28 DIAGNOSIS — G2581 Restless legs syndrome: Secondary | ICD-10-CM | POA: Diagnosis not present

## 2018-07-28 DIAGNOSIS — E039 Hypothyroidism, unspecified: Secondary | ICD-10-CM | POA: Diagnosis not present

## 2018-07-28 DIAGNOSIS — F329 Major depressive disorder, single episode, unspecified: Secondary | ICD-10-CM | POA: Diagnosis not present

## 2018-07-28 DIAGNOSIS — Z96651 Presence of right artificial knee joint: Secondary | ICD-10-CM

## 2018-07-28 DIAGNOSIS — K219 Gastro-esophageal reflux disease without esophagitis: Secondary | ICD-10-CM | POA: Insufficient documentation

## 2018-07-28 DIAGNOSIS — N189 Chronic kidney disease, unspecified: Secondary | ICD-10-CM | POA: Diagnosis not present

## 2018-07-28 DIAGNOSIS — G4733 Obstructive sleep apnea (adult) (pediatric): Secondary | ICD-10-CM | POA: Insufficient documentation

## 2018-07-28 HISTORY — PX: TOTAL KNEE ARTHROPLASTY: SHX125

## 2018-07-28 LAB — GLUCOSE, CAPILLARY
Glucose-Capillary: 120 mg/dL — ABNORMAL HIGH (ref 70–99)
Glucose-Capillary: 99 mg/dL (ref 70–99)
Glucose-Capillary: 92 mg/dL (ref 70–99)

## 2018-07-28 SURGERY — ARTHROPLASTY, KNEE, TOTAL
Anesthesia: Monitor Anesthesia Care | Laterality: Right

## 2018-07-28 MED ORDER — CLINDAMYCIN PHOSPHATE 900 MG/50ML IV SOLN
900.0000 mg | INTRAVENOUS | Status: AC
  Administered 2018-07-28: 900 mg via INTRAVENOUS
  Filled 2018-07-28: qty 50

## 2018-07-28 MED ORDER — VITAMIN B-12 1000 MCG PO TABS
1000.0000 ug | ORAL_TABLET | Freq: Every day | ORAL | Status: DC
  Administered 2018-07-29 – 2018-08-03 (×6): 1000 ug via ORAL
  Filled 2018-07-28 (×6): qty 1

## 2018-07-28 MED ORDER — LEVOTHYROXINE SODIUM 88 MCG PO TABS
88.0000 ug | ORAL_TABLET | Freq: Every day | ORAL | Status: DC
  Administered 2018-07-29 – 2018-08-03 (×6): 88 ug via ORAL
  Filled 2018-07-28 (×6): qty 1

## 2018-07-28 MED ORDER — METOCLOPRAMIDE HCL 5 MG/ML IJ SOLN
5.0000 mg | Freq: Three times a day (TID) | INTRAMUSCULAR | Status: DC | PRN
  Administered 2018-07-29: 10 mg via INTRAVENOUS
  Filled 2018-07-28: qty 2

## 2018-07-28 MED ORDER — MAGNESIUM 500 MG PO TABS: 500.0000 mg | ORAL_TABLET | Freq: Every day | ORAL | Status: DC

## 2018-07-28 MED ORDER — DOCUSATE SODIUM 100 MG PO CAPS
100.0000 mg | ORAL_CAPSULE | Freq: Two times a day (BID) | ORAL | Status: DC
  Administered 2018-07-28 – 2018-08-03 (×12): 100 mg via ORAL
  Filled 2018-07-28 (×12): qty 1

## 2018-07-28 MED ORDER — HYDROMORPHONE HCL 1 MG/ML IJ SOLN
0.5000 mg | INTRAMUSCULAR | Status: DC | PRN
  Administered 2018-07-29 (×2): 1 mg via INTRAVENOUS
  Filled 2018-07-28 (×3): qty 1

## 2018-07-28 MED ORDER — POLYVINYL ALCOHOL 1.4 % OP SOLN
2.0000 [drp] | Freq: Four times a day (QID) | OPHTHALMIC | Status: DC
  Administered 2018-07-28 – 2018-08-03 (×19): 2 [drp] via OPHTHALMIC
  Filled 2018-07-28 (×2): qty 15

## 2018-07-28 MED ORDER — MIDAZOLAM HCL 2 MG/2ML IJ SOLN
1.0000 mg | Freq: Once | INTRAMUSCULAR | Status: AC
  Administered 2018-07-28: 1 mg via INTRAVENOUS

## 2018-07-28 MED ORDER — DEXAMETHASONE SODIUM PHOSPHATE 10 MG/ML IJ SOLN
INTRAMUSCULAR | Status: AC
  Filled 2018-07-28: qty 1

## 2018-07-28 MED ORDER — OXYCODONE HCL 5 MG PO TABS
ORAL_TABLET | ORAL | Status: AC
  Filled 2018-07-28: qty 2

## 2018-07-28 MED ORDER — ONDANSETRON HCL 4 MG/2ML IJ SOLN
INTRAMUSCULAR | Status: AC
  Filled 2018-07-28: qty 2

## 2018-07-28 MED ORDER — CLINDAMYCIN PHOSPHATE 600 MG/50ML IV SOLN
600.0000 mg | Freq: Four times a day (QID) | INTRAVENOUS | Status: AC
  Administered 2018-07-28 – 2018-07-29 (×2): 600 mg via INTRAVENOUS
  Filled 2018-07-28 (×2): qty 50

## 2018-07-28 MED ORDER — PHENOL 1.4 % MT LIQD: 1.0000 | OROMUCOSAL | Status: DC | PRN

## 2018-07-28 MED ORDER — LACTATED RINGERS IV SOLN
INTRAVENOUS | Status: DC
  Administered 2018-07-28 (×2): via INTRAVENOUS

## 2018-07-28 MED ORDER — ATORVASTATIN CALCIUM 20 MG PO TABS
20.0000 mg | ORAL_TABLET | ORAL | Status: DC
  Administered 2018-07-29 – 2018-08-03 (×3): 20 mg via ORAL
  Filled 2018-07-28 (×4): qty 1

## 2018-07-28 MED ORDER — ACETAMINOPHEN 325 MG PO TABS
325.0000 mg | ORAL_TABLET | Freq: Four times a day (QID) | ORAL | Status: DC | PRN
  Administered 2018-07-29 – 2018-08-03 (×2): 650 mg via ORAL
  Filled 2018-07-28 (×2): qty 2

## 2018-07-28 MED ORDER — POLYETHYLENE GLYCOL 3350 17 G PO PACK
17.0000 g | PACK | Freq: Every day | ORAL | Status: DC | PRN
  Administered 2018-07-31 – 2018-08-01 (×2): 17 g via ORAL
  Filled 2018-07-28 (×2): qty 1

## 2018-07-28 MED ORDER — SODIUM CHLORIDE 0.9 % IV SOLN
INTRAVENOUS | Status: DC
  Administered 2018-07-29: 03:00:00 via INTRAVENOUS

## 2018-07-28 MED ORDER — OXYCODONE HCL 5 MG PO TABS
10.0000 mg | ORAL_TABLET | ORAL | Status: DC | PRN
  Administered 2018-07-29 – 2018-07-31 (×7): 15 mg via ORAL
  Administered 2018-07-31 – 2018-08-01 (×3): 10 mg via ORAL
  Filled 2018-07-28 (×2): qty 3
  Filled 2018-07-28: qty 2
  Filled 2018-07-28 (×5): qty 3
  Filled 2018-07-28: qty 2
  Filled 2018-07-28: qty 3

## 2018-07-28 MED ORDER — PROPOFOL 500 MG/50ML IV EMUL
INTRAVENOUS | Status: DC | PRN
  Administered 2018-07-28: 50 ug/kg/min via INTRAVENOUS

## 2018-07-28 MED ORDER — DEXAMETHASONE SODIUM PHOSPHATE 10 MG/ML IJ SOLN
INTRAMUSCULAR | Status: DC | PRN
  Administered 2018-07-28: 5 mg via INTRAVENOUS

## 2018-07-28 MED ORDER — MIDAZOLAM HCL 2 MG/2ML IJ SOLN
INTRAMUSCULAR | Status: AC
  Administered 2018-07-28: 1 mg via INTRAVENOUS
  Filled 2018-07-28: qty 2

## 2018-07-28 MED ORDER — SODIUM CHLORIDE 0.9 % IV SOLN
INTRAVENOUS | Status: DC | PRN
  Administered 2018-07-28: 25 ug/min via INTRAVENOUS

## 2018-07-28 MED ORDER — FUROSEMIDE 40 MG PO TABS
40.0000 mg | ORAL_TABLET | Freq: Every day | ORAL | Status: DC
  Administered 2018-07-28 – 2018-08-03 (×7): 40 mg via ORAL
  Filled 2018-07-28 (×7): qty 1

## 2018-07-28 MED ORDER — ONDANSETRON HCL 4 MG/2ML IJ SOLN
4.0000 mg | Freq: Four times a day (QID) | INTRAMUSCULAR | Status: DC | PRN
  Administered 2018-08-02: 4 mg via INTRAVENOUS
  Filled 2018-07-28 (×2): qty 2

## 2018-07-28 MED ORDER — POLYETHYL GLYCOL-PROPYL GLYCOL 0.4-0.3 % OP SOLN: 2.0000 [drp] | Freq: Four times a day (QID) | OPHTHALMIC | Status: DC

## 2018-07-28 MED ORDER — POTASSIUM CHLORIDE CRYS ER 20 MEQ PO TBCR
40.0000 meq | EXTENDED_RELEASE_TABLET | Freq: Two times a day (BID) | ORAL | Status: DC
  Administered 2018-07-28 – 2018-08-03 (×12): 40 meq via ORAL
  Filled 2018-07-28 (×13): qty 2

## 2018-07-28 MED ORDER — CHLORHEXIDINE GLUCONATE 4 % EX LIQD: 60.0000 mL | Freq: Once | CUTANEOUS | Status: DC

## 2018-07-28 MED ORDER — METOCLOPRAMIDE HCL 5 MG PO TABS: 5.0000 mg | ORAL_TABLET | Freq: Three times a day (TID) | ORAL | Status: DC | PRN

## 2018-07-28 MED ORDER — GLYCOPYRROLATE PF 0.2 MG/ML IJ SOSY
PREFILLED_SYRINGE | INTRAMUSCULAR | Status: AC
  Filled 2018-07-28: qty 1

## 2018-07-28 MED ORDER — ONDANSETRON HCL 4 MG PO TABS: 4.0000 mg | ORAL_TABLET | Freq: Four times a day (QID) | ORAL | Status: DC | PRN

## 2018-07-28 MED ORDER — 0.9 % SODIUM CHLORIDE (POUR BTL) OPTIME
TOPICAL | Status: DC | PRN
  Administered 2018-07-28: 1000 mL

## 2018-07-28 MED ORDER — ROPIVACAINE HCL 7.5 MG/ML IJ SOLN
INTRAMUSCULAR | Status: DC | PRN
  Administered 2018-07-28: 20 mL via PERINEURAL

## 2018-07-28 MED ORDER — METHOCARBAMOL 500 MG PO TABS
500.0000 mg | ORAL_TABLET | Freq: Four times a day (QID) | ORAL | Status: DC | PRN
  Administered 2018-07-28 – 2018-08-03 (×12): 500 mg via ORAL
  Filled 2018-07-28 (×11): qty 1

## 2018-07-28 MED ORDER — MAGNESIUM OXIDE 400 (241.3 MG) MG PO TABS
400.0000 mg | ORAL_TABLET | Freq: Every day | ORAL | Status: DC
  Administered 2018-07-28 – 2018-08-03 (×7): 400 mg via ORAL
  Filled 2018-07-28 (×7): qty 1

## 2018-07-28 MED ORDER — ONDANSETRON HCL 4 MG/2ML IJ SOLN
INTRAMUSCULAR | Status: DC | PRN
  Administered 2018-07-28: 4 mg via INTRAVENOUS

## 2018-07-28 MED ORDER — FENTANYL CITRATE (PF) 100 MCG/2ML IJ SOLN
INTRAMUSCULAR | Status: AC
  Administered 2018-07-28: 100 ug via INTRAVENOUS
  Filled 2018-07-28: qty 2

## 2018-07-28 MED ORDER — PANTOPRAZOLE SODIUM 40 MG PO TBEC
40.0000 mg | DELAYED_RELEASE_TABLET | Freq: Every day | ORAL | Status: DC
  Administered 2018-07-28 – 2018-08-03 (×7): 40 mg via ORAL
  Filled 2018-07-28 (×8): qty 1

## 2018-07-28 MED ORDER — ALUM & MAG HYDROXIDE-SIMETH 200-200-20 MG/5ML PO SUSP: 30.0000 mL | ORAL | Status: DC | PRN

## 2018-07-28 MED ORDER — FENTANYL CITRATE (PF) 100 MCG/2ML IJ SOLN: 25.0000 ug | INTRAMUSCULAR | Status: DC | PRN

## 2018-07-28 MED ORDER — DIPHENHYDRAMINE HCL 12.5 MG/5ML PO ELIX: 12.5000 mg | ORAL_SOLUTION | ORAL | Status: DC | PRN

## 2018-07-28 MED ORDER — VITAMIN B-6 100 MG PO TABS
100.0000 mg | ORAL_TABLET | Freq: Every day | ORAL | Status: DC
  Administered 2018-07-29 – 2018-08-02 (×5): 100 mg via ORAL
  Filled 2018-07-28 (×6): qty 1

## 2018-07-28 MED ORDER — APIXABAN 5 MG PO TABS
5.0000 mg | ORAL_TABLET | Freq: Two times a day (BID) | ORAL | Status: DC
  Administered 2018-07-29 – 2018-08-03 (×11): 5 mg via ORAL
  Filled 2018-07-28 (×11): qty 1

## 2018-07-28 MED ORDER — MENTHOL 3 MG MT LOZG: 1.0000 | LOZENGE | OROMUCOSAL | Status: DC | PRN

## 2018-07-28 MED ORDER — CARVEDILOL 6.25 MG PO TABS
6.2500 mg | ORAL_TABLET | Freq: Two times a day (BID) | ORAL | Status: DC
  Administered 2018-07-28 – 2018-08-03 (×12): 6.25 mg via ORAL
  Filled 2018-07-28 (×12): qty 1

## 2018-07-28 MED ORDER — PROPOFOL 10 MG/ML IV BOLUS
INTRAVENOUS | Status: AC
  Filled 2018-07-28: qty 20

## 2018-07-28 MED ORDER — METHOCARBAMOL 1000 MG/10ML IJ SOLN
500.0000 mg | Freq: Four times a day (QID) | INTRAVENOUS | Status: DC | PRN
  Filled 2018-07-28: qty 5

## 2018-07-28 MED ORDER — OXYCODONE HCL 5 MG PO TABS
5.0000 mg | ORAL_TABLET | ORAL | Status: DC | PRN
  Administered 2018-07-28 – 2018-08-01 (×6): 10 mg via ORAL
  Administered 2018-08-02 – 2018-08-03 (×5): 5 mg via ORAL
  Filled 2018-07-28 (×2): qty 2
  Filled 2018-07-28 (×2): qty 1
  Filled 2018-07-28: qty 2
  Filled 2018-07-28 (×3): qty 1
  Filled 2018-07-28 (×3): qty 2

## 2018-07-28 MED ORDER — BUPROPION HCL ER (XL) 150 MG PO TB24
300.0000 mg | ORAL_TABLET | Freq: Every day | ORAL | Status: DC
  Administered 2018-07-29 – 2018-08-03 (×6): 300 mg via ORAL
  Filled 2018-07-28 (×7): qty 2

## 2018-07-28 MED ORDER — SODIUM CHLORIDE 0.9 % IR SOLN
Status: DC | PRN
  Administered 2018-07-28: 3000 mL

## 2018-07-28 MED ORDER — PROMETHAZINE HCL 25 MG/ML IJ SOLN: 6.2500 mg | INTRAMUSCULAR | Status: DC | PRN

## 2018-07-28 MED ORDER — METHOCARBAMOL 500 MG PO TABS
ORAL_TABLET | ORAL | Status: AC
  Filled 2018-07-28: qty 1

## 2018-07-28 MED ORDER — ALPRAZOLAM 0.5 MG PO TABS
0.5000 mg | ORAL_TABLET | Freq: Two times a day (BID) | ORAL | Status: DC
  Administered 2018-07-28 – 2018-08-03 (×12): 0.5 mg via ORAL
  Filled 2018-07-28 (×12): qty 1

## 2018-07-28 MED ORDER — FENTANYL CITRATE (PF) 100 MCG/2ML IJ SOLN
100.0000 ug | Freq: Once | INTRAMUSCULAR | Status: AC
  Administered 2018-07-28: 100 ug via INTRAVENOUS

## 2018-07-28 MED ORDER — BUPIVACAINE IN DEXTROSE 0.75-8.25 % IT SOLN
INTRATHECAL | Status: DC | PRN
  Administered 2018-07-28: 1.8 mL via INTRATHECAL

## 2018-07-28 SURGICAL SUPPLY — 76 items
APL SKNCLS STERI-STRIP NONHPOA (GAUZE/BANDAGES/DRESSINGS) ×1
BANDAGE ACE 6X5 VEL STRL LF (GAUZE/BANDAGES/DRESSINGS) ×4 IMPLANT
BANDAGE ELASTIC 4 VELCRO ST LF (GAUZE/BANDAGES/DRESSINGS) ×1 IMPLANT
BANDAGE ELASTIC 6 VELCRO ST LF (GAUZE/BANDAGES/DRESSINGS) ×1 IMPLANT
BANDAGE ESMARK 6X9 LF (GAUZE/BANDAGES/DRESSINGS) ×1 IMPLANT
BASEPLATE TIBIAL TRIATHALON 3 (Plate) ×1 IMPLANT
BENZOIN TINCTURE PRP APPL 2/3 (GAUZE/BANDAGES/DRESSINGS) ×1 IMPLANT
BLADE SAG 18X100X1.27 (BLADE) ×2 IMPLANT
BNDG CMPR 9X6 STRL LF SNTH (GAUZE/BANDAGES/DRESSINGS) ×1
BNDG ESMARK 6X9 LF (GAUZE/BANDAGES/DRESSINGS) ×2
BOWL SMART MIX CTS (DISPOSABLE) ×2 IMPLANT
BSPLAT TIB 3 CMNT PRM STRL KN (Plate) ×1 IMPLANT
CEMENT BONE SIMPLEX SPEEDSET (Cement) ×2 IMPLANT
COVER SURGICAL LIGHT HANDLE (MISCELLANEOUS) ×2 IMPLANT
COVER WAND RF STERILE (DRAPES) ×2 IMPLANT
CUFF TOURNIQUET SINGLE 34IN LL (TOURNIQUET CUFF) ×2 IMPLANT
CUFF TOURNIQUET SINGLE 44IN (TOURNIQUET CUFF) IMPLANT
DRAPE EXTREMITY T 121X128X90 (DRAPE) ×2 IMPLANT
DRAPE HALF SHEET 40X57 (DRAPES) ×2 IMPLANT
DRAPE U-SHAPE 47X51 STRL (DRAPES) ×2 IMPLANT
DRSG PAD ABDOMINAL 8X10 ST (GAUZE/BANDAGES/DRESSINGS) ×2 IMPLANT
DURAPREP 26ML APPLICATOR (WOUND CARE) ×2 IMPLANT
ELECT CAUTERY BLADE 6.4 (BLADE) ×2 IMPLANT
ELECT REM PT RETURN 9FT ADLT (ELECTROSURGICAL) ×2
ELECTRODE REM PT RTRN 9FT ADLT (ELECTROSURGICAL) ×1 IMPLANT
FACESHIELD WRAPAROUND (MASK) ×4 IMPLANT
FACESHIELD WRAPAROUND OR TEAM (MASK) ×2 IMPLANT
FEMORAL PEG DISTAL FIXATION (Orthopedic Implant) ×1 IMPLANT
GAUZE SPONGE 4X4 12PLY STRL (GAUZE/BANDAGES/DRESSINGS) ×2 IMPLANT
GAUZE SPONGE 4X4 12PLY STRL LF (GAUZE/BANDAGES/DRESSINGS) ×1 IMPLANT
GAUZE XEROFORM 1X8 LF (GAUZE/BANDAGES/DRESSINGS) ×2 IMPLANT
GLOVE BIOGEL PI IND STRL 8 (GLOVE) ×2 IMPLANT
GLOVE BIOGEL PI INDICATOR 8 (GLOVE) ×2
GLOVE ORTHO TXT STRL SZ7.5 (GLOVE) ×2 IMPLANT
GLOVE SURG ORTHO 8.0 STRL STRW (GLOVE) ×2 IMPLANT
GOWN STRL REUS W/ TWL LRG LVL3 (GOWN DISPOSABLE) IMPLANT
GOWN STRL REUS W/ TWL XL LVL3 (GOWN DISPOSABLE) ×2 IMPLANT
GOWN STRL REUS W/TWL LRG LVL3 (GOWN DISPOSABLE)
GOWN STRL REUS W/TWL XL LVL3 (GOWN DISPOSABLE) ×4
HANDPIECE INTERPULSE COAX TIP (DISPOSABLE) ×2
IMMOBILIZER KNEE 22 UNIV (SOFTGOODS) ×2 IMPLANT
INSERT TIBIA BEARING PS 3 9MM (Insert) ×1 IMPLANT
KIT BASIN OR (CUSTOM PROCEDURE TRAY) ×2 IMPLANT
KIT TURNOVER KIT B (KITS) ×2 IMPLANT
MANIFOLD NEPTUNE II (INSTRUMENTS) ×2 IMPLANT
NDL SAFETY ECLIPSE 18X1.5 (NEEDLE) IMPLANT
NEEDLE HYPO 18GX1.5 SHARP (NEEDLE)
NS IRRIG 1000ML POUR BTL (IV SOLUTION) ×2 IMPLANT
PACK TOTAL JOINT (CUSTOM PROCEDURE TRAY) ×2 IMPLANT
PAD ABD 8X10 STRL (GAUZE/BANDAGES/DRESSINGS) ×1 IMPLANT
PAD ARMBOARD 7.5X6 YLW CONV (MISCELLANEOUS) ×3 IMPLANT
PAD CAST 4YDX4 CTTN HI CHSV (CAST SUPPLIES) IMPLANT
PADDING CAST COTTON 4X4 STRL (CAST SUPPLIES) ×2
PADDING CAST COTTON 6X4 STRL (CAST SUPPLIES) ×2 IMPLANT
PATELLA TRIATHALON (Knees) ×1 IMPLANT
POST FEMORAL PS RT (Femur) ×1 IMPLANT
SET HNDPC FAN SPRY TIP SCT (DISPOSABLE) ×1 IMPLANT
SET PAD KNEE POSITIONER (MISCELLANEOUS) ×2 IMPLANT
STAPLER VISISTAT 35W (STAPLE) IMPLANT
STRIP CLOSURE SKIN 1/2X4 (GAUZE/BANDAGES/DRESSINGS) ×1 IMPLANT
SUCTION FRAZIER HANDLE 10FR (MISCELLANEOUS) ×1
SUCTION TUBE FRAZIER 10FR DISP (MISCELLANEOUS) ×1 IMPLANT
SUT MNCRL AB 4-0 PS2 18 (SUTURE) ×1 IMPLANT
SUT VIC AB 0 CT1 27 (SUTURE) ×2
SUT VIC AB 0 CT1 27XBRD ANBCTR (SUTURE) ×1 IMPLANT
SUT VIC AB 1 CT1 27 (SUTURE) ×6
SUT VIC AB 1 CT1 27XBRD ANBCTR (SUTURE) ×2 IMPLANT
SUT VIC AB 2-0 CT1 27 (SUTURE) ×4
SUT VIC AB 2-0 CT1 TAPERPNT 27 (SUTURE) ×2 IMPLANT
SYR 50ML LL SCALE MARK (SYRINGE) IMPLANT
TOWEL OR 17X24 6PK STRL BLUE (TOWEL DISPOSABLE) ×2 IMPLANT
TOWEL OR 17X26 10 PK STRL BLUE (TOWEL DISPOSABLE) ×2 IMPLANT
TRAY CATH 16FR W/PLASTIC CATH (SET/KITS/TRAYS/PACK) IMPLANT
TRAY FOLEY CATH 16FR SILVER (SET/KITS/TRAYS/PACK) ×1 IMPLANT
WRAP KNEE MAXI GEL POST OP (GAUZE/BANDAGES/DRESSINGS) ×2 IMPLANT
YANKAUER SUCT BULB TIP NO VENT (SUCTIONS) ×1 IMPLANT

## 2018-07-28 NOTE — Op Note (Signed)
[name], FRAZEE MEDICAL RECORD ST:[id number] ACCOUNT [id number] DATE OF BIRTH:06-Feb-1941 FACILITY: [facility] LOCATION: [facility] PHYSICIAN:Jenna Nofsinger Kerry Fort, MD  OPERATIVE REPORT  DATE OF PROCEDURE:  07/28/2018  PREOPERATIVE DIAGNOSIS:  Primary osteoarthritis and degenerative joint disease, right knee.  POSTOPERATIVE DIAGNOSIS:  Primary osteoarthritis and degenerative joint disease, right knee.  PROCEDURE:  Cemented right total knee arthroplasty.  IMPLANTS:  [name] knee system with size 3 femur, size 3 tibial tray, 9 mm thickness fixed-bearing polyethylene insert, size 29--8 mm symmetric patellar button.  SURGEON:  Jenna [name]. Jenna Linden, MD  ASSISTANT:  Jenna Emery, PA-C  ANESTHESIA: 1.  Right lower extremity adductor canal block. 2.  Spinal.  TOURNIQUET TIME:  Less than 1 hour.  ESTIMATED BLOOD LOSS:  Less than 100 mL.  ANTIBIOTICS:  900 mg IV clindamycin.  COMPLICATIONS:  None.  INDICATIONS:  The patient is a very pleasant 77 year old female with debilitating arthritis involving her right knee that has been well documented.  She has tried and failed all forms of conservative treatment.  Her pain is daily, and at this point it is  detrimentally affecting her activities of daily living, her quality of life and her mobility.  At this point, she does wish to proceed with a total knee arthroplasty.  She understands fully the risk of acute blood loss anemia, nerve or vessel injury,  fracture, infection, DVT and implant failure.  She understands her goals are to decrease pain, improve mobility and overall improve quality of life.  PROCEDURE DESCRIPTION:  After informed consent was obtained and appropriate right knee was marked, she was brought to the operating room where spinal anesthesia was obtained.  Of note, in the holding room, a right lower extremity adductor canal block was  also obtained.  She was laid in the supine position on the operating table.  A Foley  catheter was placed, and a nonsterile tourniquet was placed around her upper right thigh.  Her right thigh, knee, leg, ankle and foot were prepped and draped with  [name] and sterile drapes, and a sterile stockinette was utilized as well.  A time-out was called to identify correct patient, correct right knee.  We then used an Esmarch to wrap that leg, and tourniquet was inflated to 300 mm of pressure.  I then  made a direct midline incision over the patella and carried this proximally and distally.  We dissected down the knee joint and carried out a medial parapatellar arthrotomy, finding moderate joint effusion and significant osteoarthritis throughout her  right knee.  There was significant periarticular osteophytes and complete loss of lateral joint space.  With the knee in a flexed position, we removed the osteophytes from the knee as well as remnants of the medial and lateral meniscus and ACL and PCL.   Of note, she has a significant flexion contracture at the start of the case as well and slight valgus malalignment.  Using the extramedullary cutting guide, we set our proximal tibia cut for taking 9 mm off the high side, correcting varus and valgus and  neutral slope.  We made this cut without difficulty, but then we took an additional 2 mm.  We then went to the femur and used an intramedullary guide for the femur, setting this for a 10 mm distal femoral cut for a right knee at 5 degrees externally  rotated.  We made this cut without difficulty and still backed this down 2 more millimeters.  With a 9 mm extension block, we had achieved full extension.  We then went back to the femur and put our femoral sizing guide based off the epicondylar axis and  [place] line.  We chose a size 3 femur based off of this.  We then put a 4-in-1 cutting block for a size 3 femur, made our anterior and posterior cuts followed by our chamfer cuts and then our femoral box cut.  We then went back to the tibia and chose   a size 3 tibial tray for coverage of the tibia.  We set our rotation based on the tibial tubercle and the femur.  We made a keel cut off of this easily and then placed a trial size 3 tibial tray followed by the 3 right femur.  We trialed a fixed-bearing  9 mm polyethylene insert, and we were pleased with the range of motion and stability.  We put her knee through motion and stability testing with the trial implants in place.  We then made our patellar cut and drilled 3 holes for a central 28 mm  thickness, size 29 patellar button.  We then removed all instrumentation from the knee.  We irrigated the knee with normal saline solution using pulsatile lavage.  We mixed our cement and then cemented the real [name] tibial tray size 3  followed by the real size 3 right femur.  We removed cement debris from the knee and then placed our 9 mm fixed-bearing polyethylene insert.  We then cemented our patellar button as well.  When the cement had hardened, we removed cement debris from the  knee and hemostasis was obtained with electrocautery after the tourniquet was let down.  We then closed our arthrotomy with interrupted #1 Vicryl suture followed by 0 Vicryl in the deep tissue, 2-0 Vicryl subcutaneous tissue, 4-0 Monocryl subcuticular  stitch and Steri-Strips on the skin.  A well-padded sterile dressing was applied.  She was taken to recovery room in stable condition.  All final counts were correct.  There were no complications noted.  Of note, Jenna Stabile, PA-C, assisted the entire  case.  Assistance was crucial for facilitating all aspects of this case.  LN/NUANCE  D:07/28/2018 T:07/28/2018 JOB:[id number]

## 2018-07-28 NOTE — Anesthesia Procedure Notes (Signed)
Anesthesia Regional Block: Adductor canal block   Pre-Anesthetic Checklist: ,, timeout performed, Correct Patient, Correct Site, Correct Laterality, Correct Procedure, Correct Position, site marked, Risks and benefits discussed,  Surgical consent,  Pre-op evaluation,  At surgeon's request and post-op pain management  Laterality: Right  Prep: chloraprep       Needles:  Injection technique: Single-shot  Needle Type: Stimulator Needle - 80     Needle Length: 10cm  Needle Gauge: 21     Additional Needles:   Narrative:  Start time: 07/28/2018 1:02 PM End time: 07/28/2018 2:02 PM Injection made incrementally with aspirations every 5 mL.  Performed by: Personally

## 2018-07-28 NOTE — Anesthesia Postprocedure Evaluation (Signed)
Anesthesia Post Note  Patient: Jenna [name]  Procedure(s) Performed: RIGHT TOTAL KNEE ARTHROPLASTY (Right )     Patient location during evaluation: PACU Anesthesia Type: Spinal Level of consciousness: oriented, awake and alert and awake Pain management: pain level controlled Vital Signs Assessment: post-procedure vital signs reviewed and stable Respiratory status: spontaneous breathing, respiratory function stable and nonlabored ventilation Cardiovascular status: blood pressure returned to baseline and stable Postop Assessment: no headache, no backache, no apparent nausea or vomiting and spinal receding Anesthetic complications: no    Last Vitals:  Vitals:   07/28/18 1708 07/28/18 1730  BP:    Pulse: 89 (!) 54  Resp: 17 10  Temp: (!) 36.1 C   SpO2: 96% 100%    Last Pain:  Vitals:   07/28/18 1730  TempSrc:   PainSc: 0-No pain                 Catalina Gravel

## 2018-07-28 NOTE — Transfer of Care (Signed)
Immediate Anesthesia Transfer of Care Note  Patient: Jenna [name]  Procedure(s) Performed: RIGHT TOTAL KNEE ARTHROPLASTY (Right )  Patient Location: PACU  Anesthesia Type:MAC and Spinal  Level of Consciousness: awake, alert  and oriented  Airway & Oxygen Therapy: Patient Spontanous Breathing  Post-op Assessment: Report given to RN  Post vital signs: Reviewed and stable  Last Vitals:  Vitals Value Taken Time  BP 106/65 07/28/2018  5:04 PM  Temp    Pulse 62 07/28/2018  5:05 PM  Resp 17 07/28/2018  5:05 PM  SpO2 98 % 07/28/2018  5:05 PM  Vitals shown include unvalidated device data.  Last Pain:  Vitals:   07/28/18 1405  TempSrc:   PainSc: 0-No pain      Patients Stated Pain Goal: 3 (95/63/87 [id number])  Complications: No apparent anesthesia complications

## 2018-07-28 NOTE — Brief Op Note (Signed)
07/28/2018  4:32 PM  PATIENT:  Holly Bodily  77 y.o. female  PRE-OPERATIVE DIAGNOSIS:  right knee osteoarthritis  POST-OPERATIVE DIAGNOSIS:  right knee osteoarthritis  PROCEDURE:  Procedure(s): RIGHT TOTAL KNEE ARTHROPLASTY (Right)  SURGEON:  Surgeon(s) and Role:    Mcarthur Rossetti, MD - Primary  PHYSICIAN ASSISTANT: Benita Stabile, PA-C  ANESTHESIA:   regional and spinal  EBL:  50 mL   COUNTS:  YES  TOURNIQUET:   Total Tourniquet Time Documented: Thigh (Right) - 58 minutes Total: Thigh (Right) - 58 minutes   DICTATION: .Other Dictation: Dictation Number [id number]  PLAN OF CARE: Admit to inpatient   PATIENT DISPOSITION:  PACU - hemodynamically stable.   Delay start of Pharmacological VTE agent (>24hrs) due to surgical blood loss or risk of bleeding: no

## 2018-07-28 NOTE — H&P (Signed)
TOTAL KNEE ADMISSION H&P  Patient is being admitted for right total knee arthroplasty.  Subjective:  Chief Complaint:right knee pain.  HPI: Jenna [name], 77 y.o. female, has a history of pain and functional disability in the right knee due to arthritis and has failed non-surgical conservative treatments for greater than 12 weeks to includeNSAID's and/or analgesics, corticosteriod injections, viscosupplementation injections, flexibility and strengthening excercises, use of assistive devices, weight reduction as appropriate and activity modification.  Onset of symptoms was gradual, starting 1 years ago with gradually worsening course since that time. The patient noted no past surgery on the right knee(s).  Patient currently rates pain in the right knee(s) at 10 out of 10 with activity. Patient has night pain, worsening of pain with activity and weight bearing, pain that interferes with activities of daily living, pain with passive range of motion, crepitus and joint swelling.  Patient has evidence of subchondral sclerosis, periarticular osteophytes and joint space narrowing by imaging studies. There is no active infection.  Patient Active Problem List   Diagnosis Date Noted  . Primary osteoarthritis of right knee 12/08/2017  . Nonischemic cardiomyopathy ([place]) 05/14/2017  . S/P ablation of atrial flutter/ fib 01/10/17 01/11/2017  . A-fib ([place]) 01/10/2017  . Weakness 12/17/2016  . Acute kidney injury superimposed on chronic kidney disease ([place]) 12/17/2016  . Hyperglycemia 12/17/2016  . Normocytic anemia 12/17/2016  . Acute kidney injury ([place]) 12/08/2016  . Acute on chronic systolic congestive heart failure ([place]) 12/08/2016  . Hyperkalemia 12/08/2016  . Cystitis   . Persistent atrial fibrillation 11/15/2016  . Depressed 07/04/2015  . Peripheral vertigo 12/16/2014  . Vertigo 12/16/2014  . Aortic valve stenosis 07/26/2013  . HTN (hypertension) 07/26/2013  . Hypothyroidism 07/26/2013  . PAF  (paroxysmal atrial fibrillation) ([place]) 07/26/2013  . Anxiety 07/26/2013  . Aortic valve disorder 07/26/2013  . GERD (gastroesophageal reflux disease) 07/26/2013  . OA (osteoarthritis) 07/26/2013  . Obstructive sleep apnea 06/20/2008  . RESTLESS LEGS SYNDROME 06/20/2008   Past Medical History:  Diagnosis Date  . Anxiety   . CHF (congestive heart failure) ([place])   . Chronic lower back pain   . Chronic systolic dysfunction of left ventricle   . Depression   . Dysrhythmia    A-Fib  . Fatigue   . GERD (gastroesophageal reflux disease)   . High cholesterol   . Hypertension   . Hypothyroidism   . Melanoma of shoulder, left ([place])   . Mild aortic stenosis   . Nonischemic cardiomyopathy ([place])   . OSA on CPAP    reports compliance with CPAP  . Osteoarthritis   . Persistent atrial fibrillation with RVR    [name] 11/15/2016  . PONV (postoperative nausea and vomiting)   . RLS (restless legs syndrome)   . S/P ablation of atrial flutter/ fib 01/10/17 01/11/2017  . Type II diabetes mellitus ([facility])    no medications    Past Surgical History:  Procedure Laterality Date  . ATRIAL FIBRILLATION ABLATION N/A 01/10/2017   Procedure: Atrial Fibrillation Ablation;  Surgeon: Thompson Grayer, MD;  Location: [facility];  Service: Cardiovascular;  Laterality: N/A;  . CARDIOVERSION N/A 11/19/2016   Procedure: CARDIOVERSION;  Surgeon: Lelon Perla, MD;  Location: [facility] ENDOSCOPY;  Service: Cardiovascular;  Laterality: N/A;  . CARDIOVERSION N/A 12/11/2016   Procedure: CARDIOVERSION;  Surgeon: Dorothy Spark, MD;  Location: [facility] ENDOSCOPY;  Service: Cardiovascular;  Laterality: N/A;  . CARDIOVERSION N/A 12/18/2016   Procedure: CARDIOVERSION;  Surgeon: Skeet Latch, MD;  Location: [facility] ENDOSCOPY;  Service: Cardiovascular;  Laterality: N/A;  . CATARACT EXTRACTION W/ INTRAOCULAR LENS  IMPLANT, BILATERAL Bilateral   . DILATION AND CURETTAGE OF UTERUS    . LEXISCAN MYOVIEW  05/13/2012   No ECG changes. EKG  negative for ischemia. No significant ischemia demonstrated.  [name] MELANOMA EXCISION Left ~ 09/1979   "back of my shoulder"  . TEE WITHOUT CARDIOVERSION N/A 11/19/2016   Procedure: TRANSESOPHAGEAL ECHOCARDIOGRAM (TEE);  Surgeon: Lelon Perla, MD;  Location: [facility] ENDOSCOPY;  Service: Cardiovascular;  Laterality: N/A;  need CV, but no anesthesia available  . TEE WITHOUT CARDIOVERSION N/A 01/10/2017   Procedure: TRANSESOPHAGEAL ECHOCARDIOGRAM (TEE);  Surgeon: [name], MD;  Location: [facility] ENDOSCOPY;  Service: Cardiovascular;  Laterality: N/A;  . TRANSTHORACIC ECHOCARDIOGRAM  02/18/2012   EF >55%, mild aortic stenosis  . VAGINAL HYSTERECTOMY  1985    Current Facility-Administered Medications  Medication Dose Route Frequency Provider Last Rate Last Dose  . tranexamic acid (CYKLOKAPRON) IVPB 1,000 mg  1,000 mg Intravenous To OR Mcarthur Rossetti, MD       Allergies  Allergen Reactions  . Amoxicillin Nausea Only  . Crestor [Rosuvastatin] Other (See Comments)    Joints ached  . Lovastatin Other (See Comments)    Joints hurt all over body     Social History   Tobacco Use  . Smoking status: Never Smoker  . Smokeless tobacco: Never Used  Substance Use Topics  . Alcohol use: No    Family History  Problem Relation Age of Onset  . Hypertension Mother   . Diabetes Mother   . Heart failure Father   . Arrhythmia Sister   . Hyperlipidemia Sister   . Hypertension Sister   . Healthy Brother   . Stroke Paternal Grandmother   . Cancer Paternal Grandfather   . Healthy Daughter   . Hypertension Son   . Hyperlipidemia Son      Review of Systems  Musculoskeletal: Positive for joint pain.  All other systems reviewed and are negative.   Objective:  Physical Exam  Constitutional: She is oriented to person, place, and time. She appears well-developed and well-nourished.  HENT:  Head: Normocephalic and atraumatic.  Eyes: Pupils are equal, round, and reactive to light. EOM are normal.   Neck: Normal range of motion. Neck supple.  Cardiovascular: Normal rate and regular rhythm.  Respiratory: Effort normal and breath sounds normal.  GI: Soft. Bowel sounds are normal.  Musculoskeletal:       Right knee: She exhibits decreased range of motion, swelling, effusion and abnormal alignment. Tenderness found. Medial joint line and lateral joint line tenderness noted.  Neurological: She is alert and oriented to person, place, and time.  Skin: Skin is warm and dry.  Psychiatric: She has a normal mood and affect.    Vital signs in last 24 hours:    Labs:   Estimated body mass index is 33.03 kg/m as calculated from the following:   Height as of 07/20/18: 5\' 5"  (1.651 m).   Weight as of 07/20/18: 90 kg.   Imaging Review Plain radiographs demonstrate severe degenerative joint disease of the right knee(s). The overall alignment ismild valgus. The bone quality appears to be good for age and reported activity level.   Preoperative templating of the joint replacement has been completed, documented, and submitted to the Operating Room personnel in order to optimize intra-operative equipment management.   Anticipated LOS equal to or greater than 2 midnights due to - Age 37  and older with one or more of the following:  - Obesity  - Expected need for hospital services (PT, OT, Nursing) required for safe  discharge  - Anticipated need for postoperative skilled nursing care or inpatient rehab  - Active co-morbidities: Coronary Artery Disease and Respiratory Failure/COPD OR   - Unanticipated findings during/Post Surgery: None  - Patient is a high risk of re-admission due to: None     Assessment/Plan:  End stage arthritis, right knee   The patient history, physical examination, clinical judgment of the provider and imaging studies are consistent with end stage degenerative joint disease of the right knee(s) and total knee arthroplasty is deemed medically necessary. The  treatment options including medical management, injection therapy arthroscopy and arthroplasty were discussed at length. The risks and benefits of total knee arthroplasty were presented and reviewed. The risks due to aseptic loosening, infection, stiffness, patella tracking problems, thromboembolic complications and other imponderables were discussed. The patient acknowledged the explanation, agreed to proceed with the plan and consent was signed. Patient is being admitted for inpatient treatment for surgery, pain control, PT, OT, prophylactic antibiotics, VTE prophylaxis, progressive ambulation and ADL's and discharge planning. The patient is planning to be discharged home with home health services

## 2018-07-28 NOTE — Anesthesia Procedure Notes (Signed)
Procedure Name: MAC Date/Time: 07/28/2018 2:53 PM Performed by: Barrington Ellison, CRNA Pre-anesthesia Checklist: Patient identified, Emergency Drugs available, Suction available, Patient being monitored and Timeout performed Patient Re-evaluated:Patient Re-evaluated prior to induction Oxygen Delivery Method: Simple face mask

## 2018-07-28 NOTE — Progress Notes (Signed)
Orthopedic Tech Progress Note Patient Details:  Jenna [name] Apr 10, 1941 [id number] Viewed order from doctor's order list CPM Right Knee CPM Right Knee: On Right Knee Flexion (Degrees): 9 Right Knee Extension (Degrees): 0 Additional Comments: trapeze bar patient helper  Post Interventions Patient Tolerated: Well Instructions Provided: Care of device  Hildred Priest 07/28/2018, 5:46 PM

## 2018-07-28 NOTE — Anesthesia Preprocedure Evaluation (Addendum)
Anesthesia Evaluation  Patient identified by MRN, date of birth, ID band Patient awake    Reviewed: Allergy & Precautions, NPO status , Patient's Chart, lab work & pertinent test results, reviewed documented beta blocker date and time   History of Anesthesia Complications (+) PONV and history of anesthetic complications  Airway Mallampati: II  TM Distance: >3 FB Neck ROM: Full    Dental  (+) Teeth Intact, Dental Advisory Given   Pulmonary sleep apnea and Continuous Positive Airway Pressure Ventilation ,    breath sounds clear to auscultation       Cardiovascular hypertension, Pt. on medications and Pt. on home beta blockers +CHF (- Left ventricle: The cavity size was dilated. Systolic function)  + dysrhythmias Atrial Fibrillation + Valvular Problems/Murmurs  Rhythm:Regular Rate:Normal     Neuro/Psych PSYCHIATRIC DISORDERS Anxiety Depression negative neurological ROS     GI/Hepatic GERD  Medicated and Controlled,  Endo/Other  diabetes, Type 2, Oral Hypoglycemic AgentsHypothyroidism   Renal/GU Renal disease     Musculoskeletal   Abdominal (+) + obese,   Peds  Hematology   Anesthesia Other Findings   Reproductive/Obstetrics                             Anesthesia Physical  Anesthesia Plan  ASA: III  Anesthesia Plan: Spinal and MAC   Post-op Pain Management:    Induction: Intravenous  PONV Risk Score and Plan: 3 and Ondansetron, Dexamethasone and Propofol infusion  Airway Management Planned: Natural Airway and Simple Face Mask  Additional Equipment:   Intra-op Plan:   Post-operative Plan:   Informed Consent: I have reviewed the patients History and Physical, chart, labs and discussed the procedure including the risks, benefits and alternatives for the proposed anesthesia with the patient or authorized representative who has indicated his/her understanding and acceptance.   Dental  advisory given  Plan Discussed with: CRNA and Anesthesiologist  Anesthesia Plan Comments:         Anesthesia Quick Evaluation

## 2018-07-28 NOTE — Anesthesia Procedure Notes (Signed)
Spinal  Patient location during procedure: OR Start time: 07/28/2018 2:53 PM End time: 07/28/2018 2:58 PM Staffing Anesthesiologist: Barnet Glasgow, MD Performed: anesthesiologist  Preanesthetic Checklist Completed: patient identified, surgical consent, pre-op evaluation, timeout performed, IV checked, risks and benefits discussed and monitors and equipment checked Spinal Block Patient position: sitting Prep: site prepped and draped and [name] Patient monitoring: heart rate, cardiac monitor, continuous pulse ox and blood pressure Approach: midline Location: L2-3 Injection technique: single-shot Needle Needle type: Pencan  Needle gauge: 24 G Needle length: 10 cm Needle insertion depth: 7 cm Assessment Sensory level: T4 Additional Notes Pt tolerated procedure well

## 2018-07-29 ENCOUNTER — Encounter (HOSPITAL_COMMUNITY): Payer: Self-pay | Admitting: Orthopaedic Surgery

## 2018-07-29 DIAGNOSIS — M1711 Unilateral primary osteoarthritis, right knee: Secondary | ICD-10-CM | POA: Diagnosis not present

## 2018-07-29 LAB — BASIC METABOLIC PANEL
Anion gap: 9 (ref 5–15)
BUN: 17 mg/dL (ref 8–23)
CO2: 28 mmol/L (ref 22–32)
Calcium: 9.2 mg/dL (ref 8.9–10.3)
Chloride: 100 mmol/L (ref 98–111)
Creatinine, Ser: 0.95 mg/dL (ref 0.44–1.00)
GFR calc Af Amer: >60 mL/min (ref >60)
GFR calc non Af Amer: 57 mL/min — ABNORMAL LOW (ref >60)
Glucose, Bld: 193 mg/dL — ABNORMAL HIGH (ref 70–99)
Potassium: 4.2 mmol/L (ref 3.5–5.1)
Sodium: 137 mmol/L (ref 135–145)

## 2018-07-29 LAB — CBC
HCT: 38.2 % (ref 36.0–46.0)
Hemoglobin: 12.2 g/dL (ref 12.0–15.0)
MCH: 29 pg (ref 26.0–34.0)
MCHC: 31.9 g/dL (ref 30.0–36.0)
MCV: 91 fL (ref 80.0–100.0)
Platelets: 324 10*3/uL (ref 150–400)
RBC: 4.2 MIL/uL (ref 3.87–5.11)
RDW: 13.2 % (ref 11.5–15.5)
WBC: 12.5 10*3/uL — ABNORMAL HIGH (ref 4.0–10.5)
nRBC: 0 % (ref 0.0–0.2)

## 2018-07-29 NOTE — Evaluation (Signed)
Physical Therapy Evaluation Patient Details Name: Jenna [name] MRN: [id number] DOB: [birth year] Today's Date: 07/29/2018   History of Present Illness  Admitted for R TKA, WBAT;  has a pertinent past medical history of Anxiety, CHF (congestive heart failure) ([place]), Chronic lower back pain, Chronic systolic dysfunction of left ventricle, Depression, Dysrhythmia, Fatigue, Mild aortic stenosis, Nonischemic cardiomyopathy ([place]), , S/P ablation of atrial flutter/ fib 01/10/17 (01/11/2017), and Type II diabetes mellitus ([place]).  Clinical Impression   Pt is s/p TKA resulting in the deficits listed below (see PT Problem List). Independent with RW prior to [place]; Presents with decr functional mobility, Painful R knee, decr activity tolerance;  Pt will benefit from skilled PT to increase their independence and safety with mobility to allow discharge to the venue listed below.      Follow Up Recommendations Follow surgeon's recommendation for DC plan and follow-up therapies;Supervision/Assistance - 24 hour    Equipment Recommendations  Rolling walker with 5" wheels;3in1 (PT)    Recommendations for Other Services OT consult(as ordered)     Precautions / Restrictions Precautions Precautions: Knee;Fall Precaution Booklet Issued: Yes (comment) Precaution Comments: Pt educated to not allow any pillow or bolster under knee for healing with optimal range of motion.  Required Braces or Orthoses: Knee Immobilizer - Right Knee Immobilizer - Right: Other (comment)(KI in rm; not ordered, but useful on eval) Restrictions RLE Weight Bearing: Weight bearing as tolerated      Mobility  Bed Mobility Overal bed mobility: Needs Assistance Bed Mobility: Supine to Sit     Supine to sit: Mod assist     General bed mobility comments: Cues for technqiue; good half-bridge to EOB; light mod assist to pull to sit  Transfers Overall transfer level: Needs assistance Equipment used: Rolling walker (2  wheeled) Transfers: Sit to/from Stand Sit to Stand: Mod assist         General transfer comment: Mod assist to power up to stand; cues for hand placement  Ambulation/Gait Ambulation/Gait assistance: Min assist Gait Distance (Feet): 4 Feet Assistive device: Rolling walker (2 wheeled) Gait Pattern/deviations: Step-to pattern;Decreased step length - left;Decreased stance time - right;Antalgic;Trunk flexed     General Gait Details: Cues for sequence, and use of RW to take wiehgt off of painful RLE in stance; some dizziness with upright activity, which did not worsen with time upright  Stairs            Wheelchair Mobility    Modified Rankin (Stroke Patients Only)       Balance Overall balance assessment: Needs assistance Sitting-balance support: Feet supported;Bilateral upper extremity supported Sitting balance-Leahy Scale: Good       Standing balance-Leahy Scale: Poor Standing balance comment: dependent on UE support                             Pertinent Vitals/Pain Pain Assessment: 0-10 Pain Score: 10-Worst pain ever Pain Location: R knee with flexion therex and weight bearing Pain Descriptors / Indicators: Aching;Discomfort;Grimacing;Guarding Pain Intervention(s): Monitored during session    Home Living Family/patient expects to be discharged to:: Skilled nursing facility Living Arrangements: Alone                    Prior Function Level of Independence: Independent with assistive device(s)               Hand Dominance        Extremity/Trunk Assessment   Upper Extremity Assessment Upper  Extremity Assessment: Defer to OT evaluation    Lower Extremity Assessment Lower Extremity Assessment: RLE deficits/detail RLE Deficits / Details: Grossly decr aROM and strength, limited by pain postop; quad activation present, but weak contraction; decr tol of weight bearing RLE       Communication   Communication: No difficulties   Cognition Arousal/Alertness: Awake/alert Behavior During Therapy: WFL for tasks assessed/performed Overall Cognitive Status: Within Functional Limits for tasks assessed                                        General Comments General comments (skin integrity, edema, etc.): belching, hiccupping throughout session; nausea better than earlier this morning    Exercises Total Joint Exercises Quad Sets: AAROM;Right;5 reps Heel Slides: AAROM;Right;5 reps Straight Leg Raises: AAROM;Right;5 reps Goniometric ROM: approx 5-50 deg, limited by pain   Assessment/Plan    PT Assessment Patient needs continued PT services  PT Problem List Decreased strength;Decreased range of motion;Decreased activity tolerance;Decreased balance;Decreased mobility;Decreased coordination;Decreased knowledge of use of DME;Decreased safety awareness;Decreased knowledge of precautions;Pain       PT Treatment Interventions DME instruction;Gait training;Functional mobility training;Therapeutic activities;Balance training;Therapeutic exercise;Neuromuscular re-education;Cognitive remediation;Patient/family education    PT Goals (Current goals can be found in the Care Plan section)  Acute Rehab PT Goals Patient Stated Goal: more independence PT Goal Formulation: With patient Time For Goal Achievement: 08/05/18 Potential to Achieve Goals: Good    Frequency 7X/week   Barriers to discharge        Co-evaluation               AM-PAC PT "6 Clicks" Daily Activity  Outcome Measure Difficulty turning over in bed (including adjusting bedclothes, sheets and blankets)?: A Lot Difficulty moving from lying on back to sitting on the side of the bed? : Unable Difficulty sitting down on and standing up from a chair with arms (e.g., wheelchair, bedside commode, etc,.)?: Unable Help needed moving to and from a bed to chair (including a wheelchair)?: A Lot Help needed walking in hospital room?: A Lot Help  needed climbing 3-5 steps with a railing? : A Lot 6 Click Score: 10    End of Session Equipment Utilized During Treatment: Gait belt;Right knee immobilizer Activity Tolerance: Patient limited by pain Patient left: in chair;with call bell/phone within reach Nurse Communication: Mobility status PT Visit Diagnosis: Unsteadiness on feet (R26.81);Other abnormalities of gait and mobility (R26.89);Muscle weakness (generalized) (M62.81);Pain Pain - Right/Left: Right Pain - part of body: Knee    Time: 1962-2297 PT Time Calculation (min) (ACUTE ONLY): 37 min   Charges:   PT Evaluation $PT Eval Moderate Complexity: 1 Mod PT Treatments $Gait Training: 8-22 mins        Roney Marion, PT  Acute Rehabilitation Services Pager [phone] Office [phone]   Colletta [place] 07/29/2018, 11:01 AM

## 2018-07-29 NOTE — Progress Notes (Signed)
OT Cancellation Note  Patient Details Name: Jenna [name] MRN: [id number] DOB: 08/20/1941   Cancelled Treatment:    Reason Eval/Treat Not Completed: Pain limiting ability to participate. Pt reporting 10/10 pain in RLE. Reports she very recently took oral pain med. Plan to reattempt in a bit.  Tyrone Schimke, OT Acute Rehabilitation Services Pager: [phone] Office: [phone]  07/29/2018, 11:23 AM

## 2018-07-29 NOTE — Progress Notes (Signed)
Physical Therapy Treatment Patient Details Name: Jenna [name] MRN: [id number] DOB: 21-Dec-1940 Today's Date: 07/29/2018    History of Present Illness Admitted for R TKA, WBAT;  has a pertinent past medical history of Anxiety, CHF (congestive heart failure) ([place]), Chronic lower back pain, Chronic systolic dysfunction of left ventricle, Depression, Dysrhythmia, Fatigue, Mild aortic stenosis, Nonischemic cardiomyopathy ([place]), , S/P ablation of atrial flutter/ fib 01/10/17 (01/11/2017), and Type II diabetes mellitus ([place]).    PT Comments    Continuing work on functional mobility and activity tolerance;  Pain is still limiting activity tolerance; attempted set of exercises, but Jenna [name] was unable to tolerate; she did, however, show good progress with gait distance; will continue to follow.   Follow Up Recommendations  Follow surgeon's recommendation for DC plan and follow-up therapies;Supervision/Assistance - 24 hour     Equipment Recommendations  Rolling walker with 5" wheels;3in1 (PT)    Recommendations for Other Services       Precautions / Restrictions Precautions Precautions: Knee;Fall Precaution Booklet Issued: Yes (comment) Precaution Comments: Pt educated to not allow any pillow or bolster under knee for healing with optimal range of motion.  Required Braces or Orthoses: Knee Immobilizer - Right Knee Immobilizer - Right: Other (comment)(KI in rm; not ordered, but useful on eval) Restrictions RLE Weight Bearing: Weight bearing as tolerated    Mobility  Bed Mobility Overal bed mobility: Needs Assistance Bed Mobility: Supine to Sit     Supine to sit: Mod assist     General bed mobility comments: Cues for technqiue; good half-bridge to EOB; light mod assist to pull to sit  Transfers Overall transfer level: Needs assistance Equipment used: Rolling walker (2 wheeled) Transfers: Sit to/from Stand Sit to Stand: Mod assist         General transfer comment: Heavy mod assist  to pwoer up  Ambulation/Gait Ambulation/Gait assistance: Min assist;+2 safety/equipment Gait Distance (Feet): 30 Feet Assistive device: Rolling walker (2 wheeled) Gait Pattern/deviations: Step-to pattern;Decreased step length - left;Decreased stance time - right;Antalgic;Trunk flexed     General Gait Details: Cues for sequence, and use of RW to take wiehgt off of painful RLE in stance; some dizziness with upright activity, which did not worsen with time upright; very small steps and slow moving, but good progress with distance   Stairs             Wheelchair Mobility    Modified Rankin (Stroke Patients Only)       Balance     Sitting balance-Leahy Scale: Fair       Standing balance-Leahy Scale: Poor                              Cognition Arousal/Alertness: Awake/alert Behavior During Therapy: WFL for tasks assessed/performed Overall Cognitive Status: Within Functional Limits for tasks assessed                                        Exercises Total Joint Exercises Ankle Circles/Pumps: AROM;Both;10 reps Quad Sets: AAROM;Right;5 reps Heel Slides: AAROM;Right;5 reps Straight Leg Raises: AAROM;Right;5 reps    General Comments        Pertinent Vitals/Pain Pain Assessment: 0-10 Pain Score: 10-Worst pain ever Pain Location: R hip and knee Pain Descriptors / Indicators: Aching;Discomfort;Grimacing;Guarding Pain Intervention(s): Monitored during session    Home Living  Prior Function            PT Goals (current goals can now be found in the care plan section) Acute Rehab PT Goals Patient Stated Goal: more independence PT Goal Formulation: With patient Time For Goal Achievement: 08/05/18 Potential to Achieve Goals: Good Progress towards PT goals: Progressing toward goals(slowly)    Frequency    7X/week      PT Plan Current plan remains appropriate    Co-evaluation               AM-PAC PT "6 Clicks" Daily Activity  Outcome Measure  Difficulty turning over in bed (including adjusting bedclothes, sheets and blankets)?: A Lot Difficulty moving from lying on back to sitting on the side of the bed? : Unable Difficulty sitting down on and standing up from a chair with arms (e.g., wheelchair, bedside commode, etc,.)?: Unable Help needed moving to and from a bed to chair (including a wheelchair)?: A Lot Help needed walking in hospital room?: A Lot Help needed climbing 3-5 steps with a railing? : A Lot 6 Click Score: 10    End of Session Equipment Utilized During Treatment: Gait belt;Right knee immobilizer Activity Tolerance: Patient limited by pain Patient left: in chair;with call bell/phone within reach Nurse Communication: Mobility status PT Visit Diagnosis: Unsteadiness on feet (R26.81);Other abnormalities of gait and mobility (R26.89);Muscle weakness (generalized) (M62.81);Pain Pain - Right/Left: Right Pain - part of body: Knee     Time: 7078-6754 PT Time Calculation (min) (ACUTE ONLY): 38 min  Charges:  $Gait Training: 8-22 mins $Therapeutic Exercise: 8-22 mins $Therapeutic Activity: 8-22 mins                     Roney Marion, PT  Acute Rehabilitation Services Pager [phone] Office [place] 07/29/2018, 5:17 PM

## 2018-07-29 NOTE — Progress Notes (Signed)
Subjective: 1 Day Post-Op Procedure(s) (LRB): RIGHT TOTAL KNEE ARTHROPLASTY (Right) Patient reports pain as moderate.    Objective: Vital signs in last 24 hours: Temp:  [97 F (36.1 C)-98.5 F (36.9 C)] 98 F (36.7 C) (10/23 0534) Pulse Rate:  [52-89] 55 (10/23 0534) Resp:  [9-21] 18 (10/23 0534) BP: (106-192)/(55-96) 116/63 (10/23 0534) SpO2:  [94 %-100 %] 99 % (10/23 0534) Weight:  [90 kg] 90 kg (10/22 1324)  Intake/Output from previous day: 10/22 0701 - 10/23 0700 In: 1401.1 [P.O.:150; I.V.:1201.1; IV Piggyback:50] Out: 1900 [Urine:1850; Blood:50] Intake/Output this shift: No intake/output data recorded.  Recent Labs    07/29/18 0112  HGB 12.2   Recent Labs    07/29/18 0112  WBC 12.5*  RBC 4.20  HCT 38.2  PLT 324   Recent Labs    07/29/18 0112  NA 137  K 4.2  CL 100  CO2 28  BUN 17  CREATININE 0.95  GLUCOSE 193*  CALCIUM 9.2   No results for input(s): LABPT, INR in the last 72 hours.  Sensation intact distally Intact pulses distally Dorsiflexion/Plantar flexion intact Incision: dressing C/D/I Compartment soft  Anticipated LOS equal to or greater than 2 midnights due to - Age 5 and older with one or more of the following:  - Obesity  - Expected need for hospital services (PT, OT, Nursing) required for safe  discharge  - Anticipated need for postoperative skilled nursing care or inpatient rehab  - Active co-morbidities: None OR   - Unanticipated findings during/Post Surgery: None  - Patient is a high risk of re-admission due to: None   Assessment/Plan: 1 Day Post-Op Procedure(s) (LRB): RIGHT TOTAL KNEE ARTHROPLASTY (Right) Up with therapy Discharge to SNF by Friday.    Mcarthur Rossetti 07/29/2018, 7:33 AM

## 2018-07-29 NOTE — Evaluation (Signed)
Occupational Therapy Evaluation Patient Details Name: Jenna [name] MRN: [id number] DOB: 1941-03-10 Today's Date: 07/29/2018    History of Present Illness Admitted for R TKA, WBAT;  has a pertinent past medical history of Anxiety, CHF (congestive heart failure) ([place]), Chronic lower back pain, Chronic systolic dysfunction of left ventricle, Depression, Dysrhythmia, Fatigue, Mild aortic stenosis, Nonischemic cardiomyopathy ([place]), , S/P ablation of atrial flutter/ fib 01/10/17 (01/11/2017), and Type II diabetes mellitus ([place]).   Clinical Impression   Pt admitted with the above diagnoses and presents with below problem list. Pt will benefit from continued acute OT to address the below listed deficits and maximize independence with basic ADLs prior to d/c to next venue. PTA pt was mod I with ADLs. Pt is currently mod-max A with LB ADLs, mod A with toilet transfers (SPT this session). Pt limited by 10/10 pain in R hip and knee. Nursing aware and bringing additional pain meds.      Follow Up Recommendations  SNF    Equipment Recommendations  Other (comment)(defer to next venue)    Recommendations for Other Services       Precautions / Restrictions Precautions Precautions: Knee;Fall Precaution Booklet Issued: Yes (comment) Precaution Comments:  Required Braces or Orthoses: Knee Immobilizer - Right Knee Immobilizer - Right: Other (comment)(KI in rm; not ordered, but useful on eval) Restrictions Weight Bearing Restrictions: Yes RLE Weight Bearing: Weight bearing as tolerated      Mobility Bed Mobility Overal bed mobility: Needs Assistance Bed Mobility: Sit to Supine     Supine to sit: Mod assist Sit to supine: Mod assist   General bed mobility comments: assist to advance BLE onto bed, min A to pivot hips into position, centered on bed in supine.   Transfers Overall transfer level: Needs assistance Equipment used: Rolling walker (2 wheeled) Transfers: Sit to/from [employer] Sit to Stand: Mod assist Stand pivot transfers: Mod assist       General transfer comment: recliner to EOB. assist to power up and steady. cues for technique with rw.    Balance Overall balance assessment: Needs assistance Sitting-balance support: Feet supported;Bilateral upper extremity supported Sitting balance-Leahy Scale: Fair Sitting balance - Comments: Pt relying on single to bilateral UE support in unsupported seated position this session with pain 10/10.     Standing balance-Leahy Scale: Poor Standing balance comment: dependent on UE support                           ADL either performed or assessed with clinical judgement   ADL Overall ADL's : Needs assistance/impaired Eating/Feeding: Set up;Sitting   Grooming: Set up;Sitting   Upper Body Bathing: Minimal assistance;Sitting Upper Body Bathing Details (indicate cue type and reason): Pt relying on single to bilateral UE support in unsupported seated position. Lower Body Bathing: Moderate assistance;Maximal assistance;Sit to/from stand   Upper Body Dressing : Minimal assistance;Sitting Upper Body Dressing Details (indicate cue type and reason): Pt relying on single to bilateral UE support in unsupported seated position. Lower Body Dressing: Moderate assistance;Maximal assistance;Sit to/from stand   Toilet Transfer: Moderate assistance;Stand-pivot;BSC;RW   Toileting- Clothing Manipulation and Hygiene: Moderate assistance;Maximal assistance;Sit to/from stand   Tub/ Banker: Moderate assistance;Stand-pivot;3 in 1;Rolling walker   Functional mobility during ADLs: Moderate assistance;Rolling walker(pivotal steps) General ADL Comments: Pt completed SPT recliner to EOB and bed mobility as detailed above.     Vision         Perception  Praxis      Pertinent Vitals/Pain Pain Assessment: 0-10 Pain Score: 10-Worst pain ever Pain Location: R hip and knee Pain Descriptors / Indicators:  Aching;Discomfort;Grimacing;Guarding Pain Intervention(s): Limited activity within patient's tolerance;Monitored during session;Repositioned;Patient requesting pain meds-RN notified;Ice applied;Premedicated before session     Hand Dominance Right   Extremity/Trunk Assessment Upper Extremity Assessment Upper Extremity Assessment: Overall WFL for tasks assessed   Lower Extremity Assessment Lower Extremity Assessment: Defer to PT evaluation RLE Deficits / Details: Grossly decr aROM and strength, limited by pain postop; quad activation present, but weak contraction; decr tol of weight bearing RLE       Communication Communication Communication: No difficulties   Cognition Arousal/Alertness: Awake/alert Behavior During Therapy: WFL for tasks assessed/performed Overall Cognitive Status: Within Functional Limits for tasks assessed                                     General Comments  belching, hiccupping throughout session; nausea better than earlier this morning    Exercises   Shoulder Instructions      Home Living Family/patient expects to be discharged to:: Skilled nursing facility Living Arrangements: Alone                                      Prior Functioning/Environment Level of Independence: Independent with assistive device(s)                 OT Problem List: Decreased activity tolerance;Impaired balance (sitting and/or standing);Decreased knowledge of use of DME or AE;Decreased knowledge of precautions;Pain      OT Treatment/Interventions: Self-care/ADL training;DME and/or AE instruction;Therapeutic activities;Patient/family education;Balance training    OT Goals(Current goals can be found in the care plan section) Acute Rehab OT Goals Patient Stated Goal: more independence OT Goal Formulation: With patient Time For Goal Achievement: 08/12/18 Potential to Achieve Goals: Good ADL Goals Pt Will Perform Lower Body Bathing: sit  to/from stand;with min assist Pt Will Perform Lower Body Dressing: with min assist;sit to/from stand Pt Will Transfer to Toilet: with min guard assist;ambulating Pt Will Perform Toileting - Clothing Manipulation and hygiene: with min guard assist;sit to/from stand Additional ADL Goal #1: Pt will complete bed mobility at supervision level to prepare for OOB ADLs.  OT Frequency: Min 2X/week   Barriers to D/C:            Co-evaluation              AM-PAC PT "6 Clicks" Daily Activity     Outcome Measure Help from another person eating meals?: None Help from another person taking care of personal grooming?: None Help from another person toileting, which includes using toliet, bedpan, or urinal?: A Lot Help from another person bathing (including washing, rinsing, drying)?: A Lot Help from another person to put on and taking off regular upper body clothing?: A Little Help from another person to put on and taking off regular lower body clothing?: A Lot 6 Click Score: 17   End of Session Equipment Utilized During Treatment: Rolling walker;Right knee immobilizer CPM Right Knee CPM Right Knee: Off Nurse Communication: Mobility status  Activity Tolerance: Patient limited by pain Patient left: in bed;with call bell/phone within reach  OT Visit Diagnosis: Unsteadiness on feet (R26.81);Pain Pain - Right/Left: Right Pain - part of body: Hip;Knee  Time: 1203-1232 OT Time Calculation (min): 29 min Charges:  OT General Charges $OT Visit: 1 Visit OT Evaluation $OT Eval Low Complexity: 1 Low OT Treatments $Self Care/Home Management : 8-22 mins  Tyrone Schimke, OT Acute Rehabilitation Services Pager: [phone] Office: [phone]   Hortencia Pilar 07/29/2018, 12:52 PM

## 2018-07-30 DIAGNOSIS — M1711 Unilateral primary osteoarthritis, right knee: Secondary | ICD-10-CM | POA: Diagnosis not present

## 2018-07-30 MED ORDER — METHOCARBAMOL 500 MG PO TABS: 500.0000 mg | ORAL_TABLET | Freq: Four times a day (QID) | ORAL | 0 refills | Status: DC | PRN

## 2018-07-30 MED ORDER — OXYCODONE HCL 5 MG PO TABS: 5.0000 mg | ORAL_TABLET | ORAL | 0 refills | Status: DC | PRN

## 2018-07-30 NOTE — Progress Notes (Signed)
Physical Therapy Treatment Patient Details Name: Jenna [name] MRN: [id number] DOB: [birth year] Today's Date: 07/30/2018    History of Present Illness Admitted for R TKA, WBAT;  has a pertinent past medical history of Anxiety, CHF (congestive heart failure) ([place]), Chronic lower back pain, Chronic systolic dysfunction of left ventricle, Depression, Dysrhythmia, Fatigue, Mild aortic stenosis, Nonischemic cardiomyopathy ([place]), , S/P ablation of atrial flutter/ fib 01/10/17 (01/11/2017), and Type II diabetes mellitus ([place]).    PT Comments    Patient seen for mobility progression. Pt limited by pain and requires mod/max A for functional transfers and min/mod A for gait training.  Pt tolerated short distance gait in room and needs increased time for all mobility tasks. Continue to progress as tolerated with anticipated d/c to SNF for further skilled PT services.    Follow Up Recommendations  Follow surgeon's recommendation for DC plan and follow-up therapies;Supervision/Assistance - 24 hour     Equipment Recommendations  Rolling walker with 5" wheels;3in1 (PT)    Recommendations for Other Services OT consult(as ordered)     Precautions / Restrictions Precautions Precautions: Knee;Fall Required Braces or Orthoses: Knee Immobilizer - Right Knee Immobilizer - Right: Other (comment)(KI in rm; not ordered, but useful ) Restrictions Weight Bearing Restrictions: Yes RLE Weight Bearing: Weight bearing as tolerated    Mobility  Bed Mobility               General bed mobility comments: pt OOB in chair upon arrival  Transfers Overall transfer level: Needs assistance Equipment used: Rolling walker (2 wheeled) Transfers: Sit to/from Stand Sit to Stand: Mod assist;Max assist         General transfer comment: asssitance required to stand from recliner and BSC with cues for safe hand placement; pt requires assist for powering up, balance, R LE comfort  Ambulation/Gait Ambulation/Gait  assistance: Min assist;Mod assist Gait Distance (Feet): (50ft X 2 trials ) Assistive device: Rolling walker (2 wheeled) Gait Pattern/deviations: Step-to pattern;Decreased step length - left;Decreased stance time - right;Antalgic;Trunk flexed;Decreased step length - right;Decreased dorsiflexion - right;Decreased dorsiflexion - left;Shuffle     General Gait Details: cues for sequencing, safe use of AD, weight shifting and use of bilat UE to offload R LE and posture; assistance to steady , guide RW, and for weight shifting    Stairs             Wheelchair Mobility    Modified Rankin (Stroke Patients Only)       Balance Overall balance assessment: Needs assistance Sitting-balance support: Feet supported;Bilateral upper extremity supported Sitting balance-Leahy Scale: Fair       Standing balance-Leahy Scale: Poor                              Cognition Arousal/Alertness: Awake/alert Behavior During Therapy: WFL for tasks assessed/performed Overall Cognitive Status: Within Functional Limits for tasks assessed                                        Exercises Total Joint Exercises Ankle Circles/Pumps: AROM;Both Quad Sets: AROM;Both    General Comments        Pertinent Vitals/Pain Pain Assessment: Faces Faces Pain Scale: Hurts whole lot Pain Location: R LE Pain Descriptors / Indicators: Aching;Grimacing;Guarding;Sore;Moaning Pain Intervention(s): Limited activity within patient's tolerance;Monitored during session;Repositioned;Patient requesting pain meds-RN notified;RN gave pain meds during session  Home Living                      Prior Function            PT Goals (current goals can now be found in the care plan section) Acute Rehab PT Goals Patient Stated Goal: more independence Progress towards PT goals: Not progressing toward goals - comment(limited by pain)    Frequency    7X/week      PT Plan Current  plan remains appropriate    Co-evaluation              AM-PAC PT "6 Clicks" Daily Activity  Outcome Measure  Difficulty turning over in bed (including adjusting bedclothes, sheets and blankets)?: A Lot Difficulty moving from lying on back to sitting on the side of the bed? : Unable Difficulty sitting down on and standing up from a chair with arms (e.g., wheelchair, bedside commode, etc,.)?: Unable Help needed moving to and from a bed to chair (including a wheelchair)?: A Lot Help needed walking in hospital room?: A Lot Help needed climbing 3-5 steps with a railing? : Total 6 Click Score: 9    End of Session Equipment Utilized During Treatment: Gait belt;Right knee immobilizer Activity Tolerance: Patient limited by pain Patient left: in chair;with call bell/phone within reach Nurse Communication: Mobility status PT Visit Diagnosis: Unsteadiness on feet (R26.81);Other abnormalities of gait and mobility (R26.89);Muscle weakness (generalized) (M62.81);Pain Pain - Right/Left: Right Pain - part of body: Knee     Time: 0950-1050 PT Time Calculation (min) (ACUTE ONLY): 60 min  Charges:  $Gait Training: 23-37 mins $Therapeutic Activity: 23-37 mins                     Earney Navy, PTA Acute Rehabilitation Services Pager: [phone] Office: [phone]     Darliss Cheney 07/30/2018, 2:17 PM

## 2018-07-30 NOTE — Discharge Instructions (Signed)
INSTRUCTIONS AFTER JOINT REPLACEMENT  ° °o Remove items at home which could result in a fall. This includes throw rugs or furniture in walking pathways °o ICE to the affected joint every three hours while awake for 30 minutes at a time, for at least the first 3-5 days, and then as needed for pain and swelling.  Continue to use ice for pain and swelling. You may notice swelling that will progress down to the foot and ankle.  This is normal after surgery.  Elevate your leg when you are not up walking on it.   °o Continue to use the breathing machine you got in the hospital (incentive spirometer) which will help keep your temperature down.  It is common for your temperature to cycle up and down following surgery, especially at night when you are not up moving around and exerting yourself.  The breathing machine keeps your lungs expanded and your temperature down. ° ° °DIET:  As you were doing prior to hospitalization, we recommend a well-balanced diet. ° °DRESSING / WOUND CARE / SHOWERING ° °Keep the surgical dressing until follow up.  The dressing is water proof, so you can shower without any extra covering.  IF THE DRESSING FALLS OFF or the wound gets wet inside, change the dressing with sterile gauze.  Please use good hand washing techniques before changing the dressing.  Do not use any lotions or creams on the incision until instructed by your surgeon.   ° °ACTIVITY ° °o Increase activity slowly as tolerated, but follow the weight bearing instructions below.   °o No driving for 6 weeks or until further direction given by your physician.  You cannot drive while taking narcotics.  °o No lifting or carrying greater than 10 lbs. until further directed by your surgeon. °o Avoid periods of inactivity such as sitting longer than an hour when not asleep. This helps prevent blood clots.  °o You may return to work once you are authorized by your doctor.  ° ° ° °WEIGHT BEARING  ° °Weight bearing as tolerated with assist  device (walker, cane, etc) as directed, use it as long as suggested by your surgeon or therapist, typically at least 4-6 weeks. ° ° °EXERCISES ° °Results after joint replacement surgery are often greatly improved when you follow the exercise, range of motion and muscle strengthening exercises prescribed by your doctor. Safety measures are also important to protect the joint from further injury. Any time any of these exercises cause you to have increased pain or swelling, decrease what you are doing until you are comfortable again and then slowly increase them. If you have problems or questions, call your caregiver or physical therapist for advice.  ° °Rehabilitation is important following a joint replacement. After just a few days of immobilization, the muscles of the leg can become weakened and shrink (atrophy).  These exercises are designed to build up the tone and strength of the thigh and leg muscles and to improve motion. Often times heat used for twenty to thirty minutes before working out will loosen up your tissues and help with improving the range of motion but do not use heat for the first two weeks following surgery (sometimes heat can increase post-operative swelling).  ° °These exercises can be done on a training (exercise) mat, on the floor, on a table or on a bed. Use whatever works the best and is most comfortable for you.    Use music or television while you are exercising so that   the exercises are a pleasant break in your day. This will make your life better with the exercises acting as a break in your routine that you can look forward to.   Perform all exercises about fifteen times, three times per day or as directed.  You should exercise both the operative leg and the other leg as well. ° °Exercises include: °  °• Quad Sets - Tighten up the muscle on the front of the thigh (Quad) and hold for 5-10 seconds.   °• Straight Leg Raises - With your knee straight (if you were given a brace, keep it on),  lift the leg to 60 degrees, hold for 3 seconds, and slowly lower the leg.  Perform this exercise against resistance later as your leg gets stronger.  °• Leg Slides: Lying on your back, slowly slide your foot toward your buttocks, bending your knee up off the floor (only go as far as is comfortable). Then slowly slide your foot back down until your leg is flat on the floor again.  °• Angel Wings: Lying on your back spread your legs to the side as far apart as you can without causing discomfort.  °• Hamstring Strength:  Lying on your back, push your heel against the floor with your leg straight by tightening up the muscles of your buttocks.  Repeat, but this time bend your knee to a comfortable angle, and push your heel against the floor.  You may put a pillow under the heel to make it more comfortable if necessary.  ° °A rehabilitation program following joint replacement surgery can speed recovery and prevent re-injury in the future due to weakened muscles. Contact your doctor or a physical therapist for more information on knee rehabilitation.  ° ° °CONSTIPATION ° °Constipation is defined medically as fewer than three stools per week and severe constipation as less than one stool per week.  Even if you have a regular bowel pattern at home, your normal regimen is likely to be disrupted due to multiple reasons following surgery.  Combination of anesthesia, postoperative narcotics, change in appetite and fluid intake all can affect your bowels.  ° °YOU MUST use at least one of the following options; they are listed in order of increasing strength to get the job done.  They are all available over the counter, and you may need to use some, POSSIBLY even all of these options:   ° °Drink plenty of fluids (prune juice may be helpful) and high fiber foods °Colace 100 mg by mouth twice a day  °Senokot for constipation as directed and as needed Dulcolax (bisacodyl), take with full glass of water  °Miralax (polyethylene glycol)  once or twice a day as needed. ° °If you have tried all these things and are unable to have a bowel movement in the first 3-4 days after surgery call either your surgeon or your primary doctor.   ° °If you experience loose stools or diarrhea, hold the medications until you stool forms back up.  If your symptoms do not get better within 1 week or if they get worse, check with your doctor.  If you experience "the worst abdominal pain ever" or develop nausea or vomiting, please contact the office immediately for further recommendations for treatment. ° ° °ITCHING:  If you experience itching with your medications, try taking only a single pain pill, or even half a pain pill at a time.  You can also use Benadryl over the counter for itching or also to   help with sleep.  ° °TED HOSE STOCKINGS:  Use stockings on both legs until for at least 2 weeks or as directed by physician office. They may be removed at night for sleeping. ° °MEDICATIONS:  See your medication summary on the “After Visit Summary” that nursing will review with you.  You may have some home medications which will be placed on hold until you complete the course of blood thinner medication.  It is important for you to complete the blood thinner medication as prescribed. ° °PRECAUTIONS:  If you experience chest pain or shortness of breath - call 911 immediately for transfer to the hospital emergency department.  ° °If you develop a fever greater that 101 F, purulent drainage from wound, increased redness or drainage from wound, foul odor from the wound/dressing, or calf pain - CONTACT YOUR SURGEON.   °                                                °FOLLOW-UP APPOINTMENTS:  If you do not already have a post-op appointment, please call the office for an appointment to be seen by your surgeon.  Guidelines for how soon to be seen are listed in your “After Visit Summary”, but are typically between 1-4 weeks after surgery. ° °OTHER INSTRUCTIONS:  ° °Knee  Replacement:  Do not place pillow under knee, focus on keeping the knee straight while resting. CPM instructions: 0-90 degrees, 2 hours in the morning, 2 hours in the afternoon, and 2 hours in the evening. Place foam block, curve side up under heel at all times except when in CPM or when walking.  DO NOT modify, tear, cut, or change the foam block in any way. ° °MAKE SURE YOU:  °• Understand these instructions.  °• Get help right away if you are not doing well or get worse.  ° ° °Thank you for letting us be a part of your medical care team.  It is a privilege we respect greatly.  We hope these instructions will help you stay on track for a fast and full recovery!  ° °Information on my medicine - ELIQUIS® (apixaban) ° °Why was Eliquis® prescribed for you? °Eliquis® was prescribed for you to reduce the risk of a blood clot forming that can cause a stroke if you have a medical condition called atrial fibrillation (a type of irregular heartbeat). ° °What do You need to know about Eliquis® ? °Take your Eliquis® TWICE DAILY - one tablet in the morning and one tablet in the evening with or without food. If you have difficulty swallowing the tablet whole please discuss with your pharmacist how to take the medication safely. ° °Take Eliquis® exactly as prescribed by your doctor and DO NOT stop taking Eliquis® without talking to the doctor who prescribed the medication.  Stopping may increase your risk of developing a stroke.  Refill your prescription before you run out. ° °After discharge, you should have regular check-up appointments with your healthcare provider that is prescribing your Eliquis®.  In the future your dose may need to be changed if your kidney function or weight changes by a significant amount or as you get older. ° °What do you do if you miss a dose? °If you miss a dose, take it as soon as you remember on the same day and resume taking twice daily.    Do not take more than one dose of ELIQUIS at the same time  to make up a missed dose. ° °Important Safety Information °A possible side effect of Eliquis® is bleeding. You should call your healthcare provider right away if you experience any of the following: °? Bleeding from an injury or your nose that does not stop. °? Unusual colored urine (red or dark brown) or unusual colored stools (red or black). °? Unusual bruising for unknown reasons. °? A serious fall or if you hit your head (even if there is no bleeding). ° °Some medicines may interact with Eliquis® and might increase your risk of bleeding or clotting while on Eliquis®. To help avoid this, consult your healthcare provider or pharmacist prior to using any new prescription or non-prescription medications, including herbals, vitamins, non-steroidal anti-inflammatory drugs (NSAIDs) and supplements. ° °This website has more information on Eliquis® (apixaban): http://www.eliquis.com/eliquis/home °

## 2018-07-30 NOTE — Progress Notes (Signed)
Subjective: 2 Days Post-Op Procedure(s) (LRB): RIGHT TOTAL KNEE ARTHROPLASTY (Right) Patient reports pain as moderate.    Objective: Vital signs in last 24 hours: Temp:  [98 F (36.7 C)-98.7 F (37.1 C)] 98 F (36.7 C) (10/24 0813) Pulse Rate:  [70-86] 70 (10/24 0813) Resp:  [14-16] 16 (10/24 0813) BP: (125-134)/(57-67) 130/61 (10/24 0813) SpO2:  [95 %-99 %] 95 % (10/24 0813)  Intake/Output from previous day: 10/23 0701 - 10/24 0700 In: 480 [P.O.:480] Out: 1600 [Urine:1600] Intake/Output this shift: Total I/O In: -  Out: 200 [Urine:200]  Recent Labs    07/29/18 0112  HGB 12.2   Recent Labs    07/29/18 0112  WBC 12.5*  RBC 4.20  HCT 38.2  PLT 324   Recent Labs    07/29/18 0112  NA 137  K 4.2  CL 100  CO2 28  BUN 17  CREATININE 0.95  GLUCOSE 193*  CALCIUM 9.2   No results for input(s): LABPT, INR in the last 72 hours.  Sensation intact distally Intact pulses distally Dorsiflexion/Plantar flexion intact Incision: dressing C/D/I No cellulitis present Compartment soft   Assessment/Plan: 2 Days Post-Op Procedure(s) (LRB): RIGHT TOTAL KNEE ARTHROPLASTY (Right) Up with therapy Plan for discharge tomorrow Discharge to SNF    Mcarthur Rossetti 07/30/2018, 12:14 PM

## 2018-07-30 NOTE — Care Management Obs Status (Signed)
[place] NOTIFICATION   Patient Details  Name: Jenna [name] MRN: [id number] Date of Birth: 24-Aug-1941   Medicare Observation Status Notification Given:  Yes    Ninfa Meeker, RN 07/30/2018, 10:49 AM

## 2018-07-30 NOTE — NC FL2 (Signed)
[place] LEVEL OF CARE SCREENING TOOL     IDENTIFICATION  Patient Name: Jenna [name] Birthdate: [birth year] Sex: female Admission Date (Current Location): 07/28/2018  [facility] and [place] Number:  Herbalist and Address:  [facility], [place] [address], [place], [place] [address]      Provider Number: [id number]  Attending Physician Name and Address:  Mcarthur Rossetti  Relative Name and Phone Number:  Kenney Houseman (daughter) [phone]    Current Level of Care: Hospital Recommended Level of Care: [place] Prior Approval Number:    Date Approved/Denied:   PASRR Number: [id number]  Discharge Plan: SNF    Current Diagnoses: Patient Active Problem List   Diagnosis Date Noted  . Status post total knee replacement, right 07/28/2018  . Primary osteoarthritis of right knee 12/08/2017  . Nonischemic cardiomyopathy ([place]) 05/14/2017  . S/P ablation of atrial flutter/ fib 01/10/17 01/11/2017  . A-fib ([place]) 01/10/2017  . Weakness 12/17/2016  . Acute kidney injury superimposed on chronic kidney disease ([place]) 12/17/2016  . Hyperglycemia 12/17/2016  . Normocytic anemia 12/17/2016  . Acute kidney injury ([place]) 12/08/2016  . Acute on chronic systolic congestive heart failure ([place]) 12/08/2016  . Hyperkalemia 12/08/2016  . Cystitis   . Persistent atrial fibrillation 11/15/2016  . Depressed 07/04/2015  . Peripheral vertigo 12/16/2014  . Vertigo 12/16/2014  . Aortic valve stenosis 07/26/2013  . HTN (hypertension) 07/26/2013  . Hypothyroidism 07/26/2013  . PAF (paroxysmal atrial fibrillation) ([place]) 07/26/2013  . Anxiety 07/26/2013  . Aortic valve disorder 07/26/2013  . GERD (gastroesophageal reflux disease) 07/26/2013  . OA (osteoarthritis) 07/26/2013  . Obstructive sleep apnea 06/20/2008  . RESTLESS LEGS SYNDROME 06/20/2008    Orientation RESPIRATION BLADDER Height & Weight     Self, Time, Situation, Place  Normal Continent Weight: 198 lb 8 oz (90 kg) Height:  5\' 5"  (165.1 cm)  BEHAVIORAL SYMPTOMS/MOOD NEUROLOGICAL BOWEL NUTRITION STATUS      Continent Diet(see discharge summary)  AMBULATORY STATUS COMMUNICATION OF NEEDS Skin   Limited Assist Verbally Surgical wounds(right leg closed surgical incision)                       Personal Care Assistance Level of Assistance  Bathing, Feeding, Dressing, Total care Bathing Assistance: Limited assistance Feeding assistance: Independent Dressing Assistance: Limited assistance Total Care Assistance: Limited assistance   Functional Limitations Info  Sight, Hearing, Speech Sight Info: Adequate Hearing Info: Adequate Speech Info: Adequate    SPECIAL CARE FACTORS FREQUENCY  PT (By licensed PT), OT (By licensed OT)     PT Frequency: 7x weekly OT Frequency: 2x weekly            Contractures Contractures Info: Not present    Additional Factors Info  Allergies, Code Status Code Status Info: full Allergies Info: Allergies:  Amoxicillin, Crestor Rosuvastatin, Lovastatin           Current Medications (07/30/2018):  This is the current hospital active medication list Current Facility-Administered Medications  Medication Dose Route Frequency Provider Last Rate Last Dose  . 0.9 %  sodium chloride infusion   Intravenous Continuous Mcarthur Rossetti, MD   Stopped at 07/29/18 0301  . acetaminophen (TYLENOL) tablet 325-650 mg  325-650 mg Oral Q6H PRN Mcarthur Rossetti, MD   650 mg at 07/29/18 1551  . ALPRAZolam Duanne Moron) tablet 0.5 mg  0.5 mg Oral BID Mcarthur Rossetti, MD   0.5 mg at 07/30/18 1003  .  alum & mag hydroxide-simeth (MAALOX/MYLANTA) 200-200-20 MG/5ML suspension 30 mL  30 mL Oral Q4H PRN Mcarthur Rossetti, MD      . apixaban Arne Cleveland) tablet 5 mg  5 mg Oral BID Mcarthur Rossetti, MD   5 mg at 07/30/18 1009  . atorvastatin (LIPITOR) tablet 20 mg  20 mg Oral Q M,W,F Mcarthur Rossetti, MD   20 mg at  07/29/18 1050  . buPROPion (WELLBUTRIN XL) 24 hr tablet 300 mg  300 mg Oral Daily Mcarthur Rossetti, MD   300 mg at 07/30/18 1009  . carvedilol (COREG) tablet 6.25 mg  6.25 mg Oral BID Mcarthur Rossetti, MD   6.25 mg at 07/30/18 1009  . diphenhydrAMINE (BENADRYL) 12.5 MG/5ML elixir 12.5-25 mg  12.5-25 mg Oral Q4H PRN Mcarthur Rossetti, MD      . docusate sodium (COLACE) capsule 100 mg  100 mg Oral BID Mcarthur Rossetti, MD   100 mg at 07/30/18 1009  . furosemide (LASIX) tablet 40 mg  40 mg Oral Daily Mcarthur Rossetti, MD   40 mg at 07/30/18 1009  . HYDROmorphone (DILAUDID) injection 0.5-1 mg  0.5-1 mg Intravenous Q2H PRN Mcarthur Rossetti, MD   1 mg at 07/29/18 1248  . levothyroxine (SYNTHROID, LEVOTHROID) tablet 88 mcg  88 mcg Oral Q0600 Mcarthur Rossetti, MD   88 mcg at 07/30/18 0534  . magnesium oxide (MAG-OX) tablet 400 mg  400 mg Oral Daily Mcarthur Rossetti, MD   400 mg at 07/30/18 1010  . menthol-cetylpyridinium (CEPACOL) lozenge 3 mg  1 lozenge Oral PRN Mcarthur Rossetti, MD       Or  . phenol (CHLORASEPTIC) mouth spray 1 spray  1 spray Mouth/Throat PRN Mcarthur Rossetti, MD      . methocarbamol (ROBAXIN) tablet 500 mg  500 mg Oral Q6H PRN Mcarthur Rossetti, MD   500 mg at 07/30/18 1318   Or  . methocarbamol (ROBAXIN) 500 mg in dextrose 5 % 50 mL IVPB  500 mg Intravenous Q6H PRN Mcarthur Rossetti, MD      . metoCLOPramide (REGLAN) tablet 5-10 mg  5-10 mg Oral Q8H PRN Mcarthur Rossetti, MD       Or  . metoCLOPramide (REGLAN) injection 5-10 mg  5-10 mg Intravenous Q8H PRN Mcarthur Rossetti, MD   10 mg at 07/29/18 1546  . ondansetron (ZOFRAN) tablet 4 mg  4 mg Oral Q6H PRN Mcarthur Rossetti, MD       Or  . ondansetron [facility]) injection 4 mg  4 mg Intravenous Q6H PRN Mcarthur Rossetti, MD      . oxyCODONE (Oxy IR/ROXICODONE) immediate release tablet 10-15 mg  10-15 mg Oral Q3H PRN Mcarthur Rossetti, MD   15 mg at 07/30/18 1314  . oxyCODONE (Oxy IR/ROXICODONE) immediate release tablet 5-10 mg  5-10 mg Oral Q3H PRN Mcarthur Rossetti, MD   10 mg at 07/29/18 1052  . pantoprazole (PROTONIX) EC tablet 40 mg  40 mg Oral Daily Mcarthur Rossetti, MD   40 mg at 07/30/18 1010  . polyethylene glycol (MIRALAX / GLYCOLAX) packet 17 g  17 g Oral Daily PRN Mcarthur Rossetti, MD      . polyvinyl alcohol (LIQUIFILM TEARS) 1.4 % ophthalmic solution 2 drop  2 drop Both Eyes QID Mcarthur Rossetti, MD   2 drop at 07/30/18 1010  . potassium chloride SA (K-DUR,KLOR-CON) CR tablet 40 mEq  40 mEq Oral  BID Mcarthur Rossetti, MD   40 mEq at 07/30/18 1010  . pyridOXINE (VITAMIN B-6) tablet 100 mg  100 mg Oral Q supper Mcarthur Rossetti, MD   100 mg at 07/29/18 1730  . vitamin B-12 (CYANOCOBALAMIN) tablet 1,000 mcg  1,000 mcg Oral Daily Mcarthur Rossetti, MD   1,000 mcg at 07/30/18 1010     Discharge Medications: Please see discharge summary for a list of discharge medications.  Relevant Imaging Results:  Relevant Lab Results:   Additional Information SSN: [id number]  Alberteen Sam, LCSW

## 2018-07-30 NOTE — Care Management CC44 (Signed)
Condition Code 44 Documentation Completed  Patient Details  Name: Jenna [name] MRN: [id number] Date of Birth: Nov 12, 1940   Condition Code 44 given:  Yes Patient signature on Condition Code 44 notice:  Yes Documentation of 2 MD's agreement:  Yes Code 44 added to claim:  Yes    Ninfa Meeker, RN 07/30/2018, 10:49 AM

## 2018-07-30 NOTE — Clinical Social Work Note (Signed)
Clinical Social Work Assessment  Patient Details  Name: Jenna [name] MRN: [id number] Date of Birth: 25-Jun-1941  Date of referral:  07/30/18               Reason for consult:  Discharge Planning                Permission sought to share information with:  Case Manager, Facility Sport and exercise psychologist, Family Supports Permission granted to share information::  Yes, Verbal Permission Granted  Name::     Best boy::  SNF  Relationship::  daughter  Contact Information:  [phone]  Housing/Transportation Living arrangements for the past 2 months:  [place] of Information:  Patient Patient Interpreter Needed:  None Criminal Activity/Legal Involvement Pertinent to Current Situation/Hospitalization:  No - Comment as needed Significant Relationships:  Adult Children Lives with:  Self Do you feel safe going back to the place where you live?  No Need for family participation in patient care:  No (Coment)  Care giving concerns:  CSW received referral for possible SNF placement at time of discharge. Spoke with patient regarding possibility of SNF placement . Patient's family is currently unable to care for her at their home given patient's current needs and fall risk.  Patient  expressed understanding of PT recommendation and are agreeable to SNF placement at time of discharge. CSW to continue to follow and assist with discharge planning needs.     Social Worker assessment / plan:  Spoke with patient concerning possibility of rehab at SNF before returning home.    Employment status:  Retired Nurse, adult PT Recommendations:  [place] / Referral to community resources:  [place]  Patient/Family's Response to care:  Patient recognize need for rehab before returning home and are agreeable to a SNF in [place]. They report preference for  [facility] or [facility]  . CSW explained insurance  authorization process. Patient's family reported that they want patient to get stronger to be able to come back home.    Patient/Family's Understanding of and Emotional Response to Diagnosis, Current Treatment, and Prognosis:  Patient/family is realistic regarding therapy needs and expressed being hopeful for SNF placement. Patient expressed understanding of CSW role and discharge process as well as medical condition. No questions/concerns about plan or treatment.    Emotional Assessment Appearance:  Appears stated age Attitude/Demeanor/Rapport:  Gracious, Engaged Affect (typically observed):  Accepting, Adaptable Orientation:  Oriented to Self, Oriented to Place, Oriented to  Time, Oriented to Situation Alcohol / Substance use:  Not Applicable Psych involvement (Current and /or in the community):  No (Comment)  Discharge Needs  Concerns to be addressed:  Discharge Planning Concerns Readmission within the last 30 days:  No Current discharge risk:  Dependent with Mobility Barriers to Discharge:  Continued Medical Work up   [employer], LCSW 07/30/2018, 3:25 PM

## 2018-07-31 DIAGNOSIS — M1711 Unilateral primary osteoarthritis, right knee: Secondary | ICD-10-CM | POA: Diagnosis not present

## 2018-07-31 NOTE — Progress Notes (Signed)
Patient ID: Jenna [name], female   DOB: June 08, 1941, 77 y.o.   MRN: [id number] Doing well overall.  Can be discharged to SNF today.

## 2018-07-31 NOTE — Progress Notes (Signed)
Physical Therapy Treatment Patient Details Name: Jenna [name] MRN: [id number] DOB: Oct 26, 1940 Today's Date: 07/31/2018    History of Present Illness Admitted for R TKA, WBAT;  has a pertinent past medical history of Anxiety, CHF (congestive heart failure) ([place]), Chronic lower back pain, Chronic systolic dysfunction of left ventricle, Depression, Dysrhythmia, Fatigue, Mild aortic stenosis, Nonischemic cardiomyopathy ([place]), , S/P ablation of atrial flutter/ fib 01/10/17 (01/11/2017), and Type II diabetes mellitus ([place]).    PT Comments    Patient seen for mobility progression. Pt is making gradual progress toward PT goals. Pt requires mod A for supine to sit and mod +2 for sit to stand transfer and min A +2 for gait training this session.  Continue to progress as tolerated with anticipated d/c to SNF for further skilled PT services.    Follow Up Recommendations  Follow surgeon's recommendation for DC plan and follow-up therapies;Supervision/Assistance - 24 hour     Equipment Recommendations  Rolling walker with 5" wheels;3in1 (PT)    Recommendations for Other Services OT consult(as ordered)     Precautions / Restrictions Precautions Precautions: Knee;Fall Required Braces or Orthoses: Knee Immobilizer - Right Knee Immobilizer - Right: Other (comment)(KI in rm; not ordered, but useful ) Restrictions Weight Bearing Restrictions: Yes RLE Weight Bearing: Weight bearing as tolerated    Mobility  Bed Mobility Overal bed mobility: Needs Assistance Bed Mobility: Supine to Sit     Supine to sit: Mod assist     General bed mobility comments: assist to bring R LE to EOB and lower from EOB and to bring R hip to EOB with bed pad; cues for sequencing and use of rail  Transfers Overall transfer level: Needs assistance Equipment used: Rolling walker (2 wheeled) Transfers: Sit to/from Stand Sit to Stand: Mod assist;+2 physical assistance         General transfer comment: assist to power up  into standing and cues for hand placement/positioning   Ambulation/Gait Ambulation/Gait assistance: Min assist;+2 physical assistance Gait Distance (Feet): 14 Feet Assistive device: Rolling walker (2 wheeled) Gait Pattern/deviations: Step-to pattern;Decreased step length - left;Decreased stance time - right;Antalgic;Trunk flexed;Decreased step length - right;Decreased dorsiflexion - right;Decreased dorsiflexion - left;Shuffle Gait velocity: decreased   General Gait Details: max vc for sequencing, proximity to RW, bilat step lengths, posture, and use of bilat UE to offload painful R LE   Stairs             Wheelchair Mobility    Modified Rankin (Stroke Patients Only)       Balance Overall balance assessment: Needs assistance Sitting-balance support: Feet supported;Bilateral upper extremity supported Sitting balance-Leahy Scale: Fair       Standing balance-Leahy Scale: Poor                              Cognition Arousal/Alertness: Awake/alert Behavior During Therapy: WFL for tasks assessed/performed Overall Cognitive Status: Within Functional Limits for tasks assessed                                        Exercises Total Joint Exercises Ankle Circles/Pumps: AROM;Both    General Comments        Pertinent Vitals/Pain Pain Assessment: Faces Faces Pain Scale: Hurts whole lot Pain Location: R LE Pain Descriptors / Indicators: Aching;Grimacing;Guarding;Sore;Moaning Pain Intervention(s): Limited activity within patient's tolerance;Monitored during session;Repositioned;Patient requesting pain meds-RN  notified;RN gave pain meds during session    Home Living                      Prior Function            PT Goals (current goals can now be found in the care plan section) Acute Rehab PT Goals Patient Stated Goal: more independence Progress towards PT goals: Progressing toward goals    Frequency    7X/week      PT  Plan Current plan remains appropriate    Co-evaluation              AM-PAC PT "6 Clicks" Daily Activity  Outcome Measure  Difficulty turning over in bed (including adjusting bedclothes, sheets and blankets)?: A Lot Difficulty moving from lying on back to sitting on the side of the bed? : Unable Difficulty sitting down on and standing up from a chair with arms (e.g., wheelchair, bedside commode, etc,.)?: Unable Help needed moving to and from a bed to chair (including a wheelchair)?: A Lot Help needed walking in hospital room?: A Lot Help needed climbing 3-5 steps with a railing? : Total 6 Click Score: 9    End of Session Equipment Utilized During Treatment: Gait belt;Right knee immobilizer Activity Tolerance: Patient limited by pain Patient left: in chair;with call bell/phone within reach;with chair alarm set Nurse Communication: Mobility status PT Visit Diagnosis: Unsteadiness on feet (R26.81);Other abnormalities of gait and mobility (R26.89);Muscle weakness (generalized) (M62.81);Pain Pain - Right/Left: Right Pain - part of body: Knee     Time: 0933-1000 PT Time Calculation (min) (ACUTE ONLY): 27 min  Charges:  $Gait Training: 23-37 mins                     Earney Navy, PTA Acute Rehabilitation Services Pager: [phone] Office: [phone]     Darliss Cheney 07/31/2018, 11:13 AM

## 2018-07-31 NOTE — Progress Notes (Signed)
Placed patient on CPAP for the night with pressure set at 11cm

## 2018-07-31 NOTE — Discharge Summary (Signed)
Patient ID: Jenna [name] MRN: [id number] DOB/AGE: [birth year] 77 y.o.  Admit date: 07/28/2018 Discharge date: 07/31/2018  Admission Diagnoses:  Principal Problem:   Primary osteoarthritis of right knee Active Problems:   Status post total knee replacement, right   Discharge Diagnoses:  Same  Past Medical History:  Diagnosis Date  . Anxiety   . CHF (congestive heart failure) ([place])   . Chronic lower back pain   . Chronic systolic dysfunction of left ventricle   . Depression   . Dysrhythmia    A-Fib  . Fatigue   . GERD (gastroesophageal reflux disease)   . High cholesterol   . Hypertension   . Hypothyroidism   . Melanoma of shoulder, left ([place])   . Mild aortic stenosis   . Nonischemic cardiomyopathy ([place])   . OSA on CPAP    reports compliance with CPAP  . Osteoarthritis   . Persistent atrial fibrillation with RVR    [name] 11/15/2016  . PONV (postoperative nausea and vomiting)   . RLS (restless legs syndrome)   . S/P ablation of atrial flutter/ fib 01/10/17 01/11/2017  . Type II diabetes mellitus ([place])    no medications    Surgeries: Procedure(s): RIGHT TOTAL KNEE ARTHROPLASTY on 07/28/2018   Consultants:   Discharged Condition: Improved  Hospital Course: Jenna [name] is an 77 y.o. female who was admitted 07/28/2018 for operative treatment ofPrimary osteoarthritis of right knee. Patient has severe unremitting pain that affects sleep, daily activities, and work/hobbies. After pre-op clearance the patient was taken to the operating room on 07/28/2018 and underwent  Procedure(s): RIGHT TOTAL KNEE ARTHROPLASTY.    Patient was given perioperative antibiotics:  Anti-infectives (From admission, onward)   Start     Dose/Rate Route Frequency Ordered Stop   07/28/18 2015  clindamycin (CLEOCIN) IVPB 600 mg     600 mg 100 mL/hr over 30 Minutes Intravenous Every 6 hours 07/28/18 2004 07/29/18 0331   07/28/18 1300  clindamycin (CLEOCIN) IVPB 900 mg     900 mg 100 mL/hr over 30  Minutes Intravenous On call to O.R. 07/28/18 1255 07/28/18 1459       Patient was given sequential compression devices, early ambulation, and chemoprophylaxis to prevent DVT.  Patient benefited maximally from hospital stay and there were no complications.    Recent vital signs:  Patient Vitals for the past 24 hrs:  BP Temp Temp src Pulse Resp SpO2  07/31/18 0541 130/60 99.5 F (37.5 C) Oral 85 17 96 %  07/30/18 2250 - - - 79 16 93 %  07/30/18 2108 (!) 114/49 99.1 F (37.3 C) Oral - 14 93 %  07/30/18 1400 (!) 111/50 99.4 F (37.4 C) Oral 72 14 93 %  07/30/18 0813 130/61 98 F (36.7 C) Oral 70 16 95 %     Recent laboratory studies:  Recent Labs    07/29/18 0112  WBC 12.5*  HGB 12.2  HCT 38.2  PLT 324  NA 137  K 4.2  CL 100  CO2 28  BUN 17  CREATININE 0.95  GLUCOSE 193*  CALCIUM 9.2     Discharge Medications:   Allergies as of 07/31/2018      Reactions   Amoxicillin Nausea Only   Crestor [rosuvastatin] Other (See Comments)   Joints ached   Lovastatin Other (See Comments)   Joints hurt all over body       Medication List    STOP taking these medications   traMADol 50 MG tablet Commonly known  as:  ULTRAM     TAKE these medications   acetaminophen 500 MG tablet Commonly known as:  TYLENOL Take 500-1,000 mg by mouth every 6 (six) hours as needed for mild pain, moderate pain or headache.   ALPRAZolam 0.5 MG tablet Commonly known as:  XANAX Take 0.5 mg by mouth 2 (two) times daily.   atorvastatin 40 MG tablet Commonly known as:  LIPITOR take 20 mg three days a week, MON, WED and FRI What changed:    how much to take  how to take this  when to take this  additional instructions   buPROPion 150 MG 24 hr tablet Commonly known as:  WELLBUTRIN XL Take 300 mg by mouth daily.   CALCIUM + D PO Take 1 tablet by mouth 2 (two) times daily.   carvedilol 6.25 MG tablet Commonly known as:  COREG Take 1 tablet (6.25 mg total) by mouth 2 (two) times  daily.   ELIQUIS 5 MG Tabs tablet Generic drug:  apixaban TAKE 1 TABLET BY MOUTH TWICE A DAY What changed:  how much to take   furosemide 40 MG tablet Commonly known as:  LASIX Take 40 mg by mouth daily.   levothyroxine 88 MCG tablet Commonly known as:  SYNTHROID, LEVOTHROID Take 88 mcg by mouth daily before breakfast.   Magnesium 500 MG Tabs Take 500 mg by mouth daily.   methocarbamol 500 MG tablet Commonly known as:  ROBAXIN Take 1 tablet (500 mg total) by mouth every 6 (six) hours as needed for muscle spasms.   omeprazole 40 MG capsule Commonly known as:  PRILOSEC Take 40 mg by mouth daily.   oxyCODONE 5 MG immediate release tablet Commonly known as:  Oxy IR/ROXICODONE Take 1-2 tablets (5-10 mg total) by mouth every 3 (three) hours as needed for moderate pain (pain score 4-6).   potassium chloride SA 20 MEQ tablet Commonly known as:  K-DUR,KLOR-CON Take 2 tablets (40 mEq total) by mouth 2 (two) times daily.   PRESERVISION AREDS 2 Caps Take 1 capsule by mouth 2 (two) times daily.   pyridoxine 100 MG tablet Commonly known as:  B-6 Take 100 mg by mouth daily with supper.   rOPINIRole 0.5 MG tablet Commonly known as:  REQUIP Take 2-3 daily at bedtime as needed What changed:    how much to take  how to take this  when to take this  additional instructions   SYSTANE 0.4-0.3 % Soln Generic drug:  Polyethyl Glycol-Propyl Glycol Place 2 drops into both eyes 4 (four) times daily.   vitamin B-12 1000 MCG tablet Commonly known as:  CYANOCOBALAMIN Take 1,000 mcg by mouth daily.            Durable Medical Equipment  (From admission, onward)         Start     Ordered   07/28/18 2005  DME 3 n 1  Once     07/28/18 2004   07/28/18 2005  DME Walker rolling  Once    Question:  Patient needs a walker to treat with the following condition  Answer:  Status post total knee replacement, right   07/28/18 2004          Diagnostic Studies: Dg Knee Right  Port  Result Date: 07/28/2018 CLINICAL DATA:  Status post RIGHT total knee arthroplasty. EXAM: PORTABLE RIGHT KNEE - 1-2 VIEW COMPARISON:  RIGHT knee radiograph August 30, 2015 FINDINGS: Status post total knee arthroplasty with intact well seated femoral and tibial components,  expected appearance of the resurfaced patella. No acute fracture deformity. No dislocation. No destructive bony lesions. Small suprapatellar effusion. Subcutaneous emphysema consistent with recent surgery with overlying skin staples. IMPRESSION: 1. Status post recent total knee arthroplasty with expected postoperative change. Electronically Signed   By: Elon Alas M.D.   On: 07/28/2018 19:56    Disposition: Discharge disposition: 03-Skilled [facility]    Mcarthur Rossetti, MD Follow up in 2 week(s).   Specialty:  Orthopedic Surgery Contact information: [place] [address] [phone]            Signed: Mcarthur Rossetti 07/31/2018, 6:53 AM

## 2018-08-01 DIAGNOSIS — M1711 Unilateral primary osteoarthritis, right knee: Secondary | ICD-10-CM | POA: Diagnosis not present

## 2018-08-01 MED ORDER — BISACODYL 10 MG RE SUPP
10.0000 mg | Freq: Every day | RECTAL | Status: DC | PRN
  Administered 2018-08-01: 10 mg via RECTAL
  Filled 2018-08-01: qty 1

## 2018-08-01 MED ORDER — SENNOSIDES-DOCUSATE SODIUM 8.6-50 MG PO TABS
1.0000 | ORAL_TABLET | Freq: Once | ORAL | Status: AC
  Administered 2018-08-01: 1 via ORAL
  Filled 2018-08-01: qty 1

## 2018-08-01 NOTE — Progress Notes (Signed)
Physical Therapy Treatment Patient Details Name: Jenna [name] MRN: [id number] DOB: 09-11-1941 Today's Date: 08/01/2018    History of Present Illness Admitted for R TKA, WBAT;  has a pertinent past medical history of Anxiety, CHF (congestive heart failure) ([place]), Chronic lower back pain, Chronic systolic dysfunction of left ventricle, Depression, Dysrhythmia, Fatigue, Mild aortic stenosis, Nonischemic cardiomyopathy ([place]), , S/P ablation of atrial flutter/ fib 01/10/17 (01/11/2017), and Type II diabetes mellitus ([place]).    PT Comments    Patient seen second time for mobility progression. Pt is a drowsy but able to participate. Pt requires assistance for LE therex and with very limited ROM due to pain despite being premedicated. Pt requires mod A for functional transfers and short distance gait back to bed. Continue to progress as tolerated.   Follow Up Recommendations  Follow surgeon's recommendation for DC plan and follow-up therapies;Supervision/Assistance - 24 hour     Equipment Recommendations  Rolling walker with 5" wheels;3in1 (PT)    Recommendations for Other Services OT consult(as ordered)     Precautions / Restrictions Precautions Precautions: Knee;Fall Required Braces or Orthoses: Knee Immobilizer - Right Knee Immobilizer - Right: Other (comment)(KI in rm; not ordered, but useful ) Restrictions Weight Bearing Restrictions: Yes RLE Weight Bearing: Weight bearing as tolerated    Mobility  Bed Mobility Overal bed mobility: Needs Assistance Bed Mobility: Sit to Supine       Sit to supine: Mod assist   General bed mobility comments: assist to bring bilat LE into bed; cues for sequencing   Transfers Overall transfer level: Needs assistance Equipment used: Rolling walker (2 wheeled) Transfers: Sit to/from Stand Sit to Stand: Mod assist         General transfer comment: assist to stand from recliner and [facility]; cues for hand placement   Ambulation/Gait Ambulation/Gait  assistance: Mod assist Gait Distance (Feet): 6 Feet Assistive device: Rolling walker (2 wheeled) Gait Pattern/deviations: Step-to pattern;Decreased step length - left;Decreased stance time - right;Antalgic;Trunk flexed;Decreased dorsiflexion - right Gait velocity: decreased   General Gait Details: cues for sequencing, posture, weight shifting, and increased bilat step lengths; assist for balance and managing RW    Stairs             Wheelchair Mobility    Modified Rankin (Stroke Patients Only)       Balance Overall balance assessment: Needs assistance Sitting-balance support: Feet supported;Bilateral upper extremity supported Sitting balance-Leahy Scale: Fair     Standing balance support: Bilateral upper extremity supported;During functional activity Standing balance-Leahy Scale: Poor                              Cognition Arousal/Alertness: Awake/alert(drowsy) Behavior During Therapy: WFL for tasks assessed/performed Overall Cognitive Status: Within Functional Limits for tasks assessed                                        Exercises Total Joint Exercises Ankle Circles/Pumps: AROM;Both Quad Sets: AROM;Both Short Arc Quad: AAROM;Right;10 reps Heel Slides: AAROM;Right;10 reps Hip ABduction/ADduction: AAROM;Right;10 reps Knee Flexion: AAROM;Right;5 reps;Seated    General Comments        Pertinent Vitals/Pain Pain Assessment: Faces Faces Pain Scale: Hurts little more(6 with therex) Pain Location: R LE Pain Descriptors / Indicators: Aching;Grimacing;Guarding;Sore Pain Intervention(s): Limited activity within patient's tolerance;Monitored during session;Premedicated before session;Repositioned    Home Living  Prior Function            PT Goals (current goals can now be found in the care plan section) Acute Rehab PT Goals Patient Stated Goal: more independence Progress towards PT goals:  Progressing toward goals    Frequency    7X/week      PT Plan Current plan remains appropriate    Co-evaluation              AM-PAC PT "6 Clicks" Daily Activity  Outcome Measure  Difficulty turning over in bed (including adjusting bedclothes, sheets and blankets)?: A Lot Difficulty moving from lying on back to sitting on the side of the bed? : Unable Difficulty sitting down on and standing up from a chair with arms (e.g., wheelchair, bedside commode, etc,.)?: Unable Help needed moving to and from a bed to chair (including a wheelchair)?: A Lot Help needed walking in hospital room?: A Lot Help needed climbing 3-5 steps with a railing? : Total 6 Click Score: 9    End of Session Equipment Utilized During Treatment: Gait belt Activity Tolerance: Patient limited by pain;Patient limited by fatigue Patient left: in chair;with call bell/phone within reach Nurse Communication: Mobility status PT Visit Diagnosis: Unsteadiness on feet (R26.81);Other abnormalities of gait and mobility (R26.89);Muscle weakness (generalized) (M62.81);Pain Pain - Right/Left: Right Pain - part of body: Knee     Time: 1400-1444 PT Time Calculation (min) (ACUTE ONLY): 44 min  Charges:  $Gait Training: 8-22 mins $Therapeutic Exercise: 8-22 mins $Therapeutic Activity: 8-22 mins                     Earney Navy, PTA Acute Rehabilitation Services Pager: [phone] Office: [phone]     Darliss Cheney 08/01/2018, 3:28 PM

## 2018-08-01 NOTE — Progress Notes (Signed)
Clinical Social Worker following for support and discharge needs. Patient at this time has a bed at [facility] but does not have authorization through insurance. Facility stated they will reach out to CSW once authorization has been obtained. CSW will inform patients RN that patient will be unable to transfer to SNF without approval through insurance.   Rhea Pink, MSW,  [place]

## 2018-08-01 NOTE — Progress Notes (Signed)
Physical Therapy Treatment Patient Details Name: Jenna [name] MRN: [id number] DOB: [birth year] Today's Date: 08/01/2018    History of Present Illness Admitted for R TKA, WBAT;  has a pertinent past medical history of Anxiety, CHF (congestive heart failure) ([place]), Chronic lower back pain, Chronic systolic dysfunction of left ventricle, Depression, Dysrhythmia, Fatigue, Mild aortic stenosis, Nonischemic cardiomyopathy ([place]), , S/P ablation of atrial flutter/ fib 01/10/17 (01/11/2017), and Type II diabetes mellitus ([place]).    PT Comments    Patient seen for mobility progression. Pt continues to make gradual progress toward PT goals and with less pain this am.  Pt does continue to require the most assistance with functional transfers. Pt tolerated ambulating into hallway this session with min A (+2 for chair follow/safety) and mod/max cues. Continue to progress as tolerated with anticipated d/c to SNF for further skilled PT services.    Follow Up Recommendations  Follow surgeon's recommendation for DC plan and follow-up therapies;Supervision/Assistance - 24 hour     Equipment Recommendations  Rolling walker with 5" wheels;3in1 (PT)    Recommendations for Other Services OT consult(as ordered)     Precautions / Restrictions Precautions Precautions: Knee;Fall Required Braces or Orthoses: Knee Immobilizer - Right Knee Immobilizer - Right: Other (comment)(KI in rm; not ordered, but useful ) Restrictions Weight Bearing Restrictions: Yes RLE Weight Bearing: Weight bearing as tolerated    Mobility  Bed Mobility               General bed mobility comments: pt OOB in chair upon arrival   Transfers Overall transfer level: Needs assistance Equipment used: Rolling walker (2 wheeled) Transfers: Sit to/from Stand Sit to Stand: Mod assist;+2 physical assistance         General transfer comment: mod A +2 to stand from recliner and BSC; cues for safe hand placement and assist to manage R LE    Ambulation/Gait Ambulation/Gait assistance: Min assist;+2 physical assistance Gait Distance (Feet): (67ft to bathroom then 77ft ) Assistive device: Rolling walker (2 wheeled) Gait Pattern/deviations: Step-to pattern;Decreased step length - left;Decreased stance time - right;Antalgic;Trunk flexed;Decreased dorsiflexion - right;Step-through pattern Gait velocity: decreased   General Gait Details: cues for posture, sequencing, proximity to RW, and increased bilat step lengths; improving step through pattern and weight bearing    Stairs             Wheelchair Mobility    Modified Rankin (Stroke Patients Only)       Balance Overall balance assessment: Needs assistance Sitting-balance support: Feet supported;Bilateral upper extremity supported Sitting balance-Leahy Scale: Fair       Standing balance-Leahy Scale: Poor                              Cognition Arousal/Alertness: Awake/alert Behavior During Therapy: WFL for tasks assessed/performed Overall Cognitive Status: Within Functional Limits for tasks assessed                                        Exercises      General Comments        Pertinent Vitals/Pain Pain Assessment: Faces Faces Pain Scale: Hurts little more Pain Location: R LE Pain Descriptors / Indicators: Aching;Grimacing;Guarding;Sore Pain Intervention(s): Limited activity within patient's tolerance;Monitored during session;Premedicated before session;Repositioned    Home Living  Prior Function            PT Goals (current goals can now be found in the care plan section) Acute Rehab PT Goals Patient Stated Goal: more independence Progress towards PT goals: Progressing toward goals    Frequency    7X/week      PT Plan Current plan remains appropriate    Co-evaluation              AM-PAC PT "6 Clicks" Daily Activity  Outcome Measure  Difficulty turning over in bed  (including adjusting bedclothes, sheets and blankets)?: A Lot Difficulty moving from lying on back to sitting on the side of the bed? : Unable Difficulty sitting down on and standing up from a chair with arms (e.g., wheelchair, bedside commode, etc,.)?: Unable Help needed moving to and from a bed to chair (including a wheelchair)?: A Lot Help needed walking in hospital room?: A Lot Help needed climbing 3-5 steps with a railing? : Total 6 Click Score: 9    End of Session Equipment Utilized During Treatment: Gait belt;Right knee immobilizer Activity Tolerance: Patient limited by pain;Patient limited by fatigue Patient left: in chair;with call bell/phone within reach Nurse Communication: Mobility status PT Visit Diagnosis: Unsteadiness on feet (R26.81);Other abnormalities of gait and mobility (R26.89);Muscle weakness (generalized) (M62.81);Pain Pain - Right/Left: Right Pain - part of body: Knee     Time: 9767-3419 PT Time Calculation (min) (ACUTE ONLY): 36 min  Charges:  $Gait Training: 8-22 mins $Therapeutic Activity: 8-22 mins                     Earney Navy, PTA Acute Rehabilitation Services Pager: [phone] Office: [phone]     Darliss Cheney 08/01/2018, 11:34 AM

## 2018-08-01 NOTE — Progress Notes (Signed)
Patient ID: Jenna [name], female   DOB: 06-17-1941, 77 y.o.   MRN: [id number] Was discharged to SNF yesterday, but still here today.  According to the patient, bed availability was an issue.  No notes from Social Work yesterday afternoon.  Her right knee is stable.  She does complain of constipation.  Will try suppository if needed.  Will try to discharge again today.

## 2018-08-01 NOTE — Discharge Summary (Signed)
Patient ID: Jenna [name] MRN: [id number] DOB/AGE: 07-05-1941 77 y.o.  Admit date: 07/28/2018 Discharge date: 08/01/2018  Admission Diagnoses:  Principal Problem:   Primary osteoarthritis of right knee Active Problems:   Status post total knee replacement, right   Discharge Diagnoses:  Same  Past Medical History:  Diagnosis Date  . Anxiety   . CHF (congestive heart failure) ([place])   . Chronic lower back pain   . Chronic systolic dysfunction of left ventricle   . Depression   . Dysrhythmia    A-Fib  . Fatigue   . GERD (gastroesophageal reflux disease)   . High cholesterol   . Hypertension   . Hypothyroidism   . Melanoma of shoulder, left ([place])   . Mild aortic stenosis   . Nonischemic cardiomyopathy ([place])   . OSA on CPAP    reports compliance with CPAP  . Osteoarthritis   . Persistent atrial fibrillation with RVR    [name] 11/15/2016  . PONV (postoperative nausea and vomiting)   . RLS (restless legs syndrome)   . S/P ablation of atrial flutter/ fib 01/10/17 01/11/2017  . Type II diabetes mellitus ([place])    no medications    Surgeries: Procedure(s): RIGHT TOTAL KNEE ARTHROPLASTY on 07/28/2018   Consultants:   Discharged Condition: Improved  Hospital Course: Jenna [name] is an 77 y.o. female who was admitted 07/28/2018 for operative treatment ofPrimary osteoarthritis of right knee. Patient has severe unremitting pain that affects sleep, daily activities, and work/hobbies. After pre-op clearance the patient was taken to the operating room on 07/28/2018 and underwent  Procedure(s): RIGHT TOTAL KNEE ARTHROPLASTY.    Patient was given perioperative antibiotics:  Anti-infectives (From admission, onward)   Start     Dose/Rate Route Frequency Ordered Stop   07/28/18 2015  clindamycin (CLEOCIN) IVPB 600 mg     600 mg 100 mL/hr over 30 Minutes Intravenous Every 6 hours 07/28/18 2004 07/29/18 0331   07/28/18 1300  clindamycin (CLEOCIN) IVPB 900 mg     900 mg 100 mL/hr over  30 Minutes Intravenous On call to O.R. 07/28/18 1255 07/28/18 1459       Patient was given sequential compression devices, early ambulation, and chemoprophylaxis to prevent DVT.  Patient benefited maximally from hospital stay and there were no complications.    Recent vital signs:  Patient Vitals for the past 24 hrs:  BP Temp Temp src Pulse Resp SpO2  08/01/18 0635 121/74 99.1 F (37.3 C) Oral 79 - 94 %  07/31/18 2338 - - - 81 19 99 %  07/31/18 2126 (!) 119/51 98.9 F (37.2 C) Oral 72 - 100 %  07/31/18 1712 (!) 123/58 99 F (37.2 C) Oral 79 16 98 %     Recent laboratory studies: No results for input(s): WBC, HGB, HCT, PLT, NA, K, CL, CO2, BUN, CREATININE, GLUCOSE, INR, CALCIUM in the last 72 hours.  Invalid input(s): PT, 2   Discharge Medications:   Allergies as of 08/01/2018      Reactions   Amoxicillin Nausea Only   Crestor [rosuvastatin] Other (See Comments)   Joints ached   Lovastatin Other (See Comments)   Joints hurt all over body       Medication List    STOP taking these medications   traMADol 50 MG tablet Commonly known as:  ULTRAM     TAKE these medications   acetaminophen 500 MG tablet Commonly known as:  TYLENOL Take 500-1,000 mg by mouth every 6 (six) hours as needed  for mild pain, moderate pain or headache.   ALPRAZolam 0.5 MG tablet Commonly known as:  XANAX Take 0.5 mg by mouth 2 (two) times daily.   atorvastatin 40 MG tablet Commonly known as:  LIPITOR take 20 mg three days a week, MON, WED and FRI What changed:    how much to take  how to take this  when to take this  additional instructions   buPROPion 150 MG 24 hr tablet Commonly known as:  WELLBUTRIN XL Take 300 mg by mouth daily.   CALCIUM + D PO Take 1 tablet by mouth 2 (two) times daily.   carvedilol 6.25 MG tablet Commonly known as:  COREG Take 1 tablet (6.25 mg total) by mouth 2 (two) times daily.   ELIQUIS 5 MG Tabs tablet Generic drug:  apixaban TAKE 1 TABLET BY  MOUTH TWICE A DAY What changed:  how much to take   furosemide 40 MG tablet Commonly known as:  LASIX Take 40 mg by mouth daily.   levothyroxine 88 MCG tablet Commonly known as:  SYNTHROID, LEVOTHROID Take 88 mcg by mouth daily before breakfast.   Magnesium 500 MG Tabs Take 500 mg by mouth daily.   methocarbamol 500 MG tablet Commonly known as:  ROBAXIN Take 1 tablet (500 mg total) by mouth every 6 (six) hours as needed for muscle spasms.   omeprazole 40 MG capsule Commonly known as:  PRILOSEC Take 40 mg by mouth daily.   oxyCODONE 5 MG immediate release tablet Commonly known as:  Oxy IR/ROXICODONE Take 1-2 tablets (5-10 mg total) by mouth every 3 (three) hours as needed for moderate pain (pain score 4-6).   potassium chloride SA 20 MEQ tablet Commonly known as:  K-DUR,KLOR-CON Take 2 tablets (40 mEq total) by mouth 2 (two) times daily.   PRESERVISION AREDS 2 Caps Take 1 capsule by mouth 2 (two) times daily.   pyridoxine 100 MG tablet Commonly known as:  B-6 Take 100 mg by mouth daily with supper.   rOPINIRole 0.5 MG tablet Commonly known as:  REQUIP Take 2-3 daily at bedtime as needed What changed:    how much to take  how to take this  when to take this  additional instructions   SYSTANE 0.4-0.3 % Soln Generic drug:  Polyethyl Glycol-Propyl Glycol Place 2 drops into both eyes 4 (four) times daily.   vitamin B-12 1000 MCG tablet Commonly known as:  CYANOCOBALAMIN Take 1,000 mcg by mouth daily.            Durable Medical Equipment  (From admission, onward)         Start     Ordered   07/28/18 2005  DME 3 n 1  Once     07/28/18 2004   07/28/18 2005  DME Walker rolling  Once    Question:  Patient needs a walker to treat with the following condition  Answer:  Status post total knee replacement, right   07/28/18 2004          Diagnostic Studies: Dg Knee Right Port  Result Date: 07/28/2018 CLINICAL DATA:  Status post RIGHT total knee  arthroplasty. EXAM: PORTABLE RIGHT KNEE - 1-2 VIEW COMPARISON:  RIGHT knee radiograph August 30, 2015 FINDINGS: Status post total knee arthroplasty with intact well seated femoral and tibial components, expected appearance of the resurfaced patella. No acute fracture deformity. No dislocation. No destructive bony lesions. Small suprapatellar effusion. Subcutaneous emphysema consistent with recent surgery with overlying skin staples. IMPRESSION: 1. Status  post recent total knee arthroplasty with expected postoperative change. Electronically Signed   By: Elon Alas M.D.   On: 07/28/2018 19:56    Disposition: Discharge disposition: 03-Skilled [place] information for follow-up providers    Mcarthur Rossetti, MD Follow up in 2 week(s).   Specialty:  Orthopedic Surgery Contact information: [place] [address] [phone]            Contact information for after-discharge care    Destination    [facility]erred SN[facility] .   Service:  Skilled Nursing Contact information: [place] [phone]                   Signed: Mcarthur Rossetti 08/01/2018, 7:55 AM

## 2018-08-02 DIAGNOSIS — M1711 Unilateral primary osteoarthritis, right knee: Secondary | ICD-10-CM | POA: Diagnosis not present

## 2018-08-02 NOTE — Progress Notes (Signed)
Placed patient on CPAP at 11cm H20 for the night.

## 2018-08-02 NOTE — Progress Notes (Signed)
Physical Therapy Treatment Patient Details Name: Jenna [name] MRN: [id number] DOB: 04-09-1941 Today's Date: 08/02/2018    History of Present Illness Admitted for R TKA, WBAT;  has a pertinent past medical history of Anxiety, CHF (congestive heart failure) ([place]), Chronic lower back pain, Chronic systolic dysfunction of left ventricle, Depression, Dysrhythmia, Fatigue, Mild aortic stenosis, Nonischemic cardiomyopathy ([place]), , S/P ablation of atrial flutter/ fib 01/10/17 (01/11/2017), and Type II diabetes mellitus ([place]).    PT Comments    Continuing work on functional mobility and activity tolerance;  Able to make modest progress with amb distance today; Ms. Zafar requires encouragement to push herself to incr amb distance; cues also to incr L step length to get to a more step-through gait pattern; slow, but present progress; agree with dc to SNF for post-acute rehab to maximize independence and safety with mobility prior to dc home   Follow Up Recommendations  Follow surgeon's recommendation for DC plan and follow-up therapies     Equipment Recommendations  Rolling walker with 5" wheels;3in1 (PT)    Recommendations for Other Services       Precautions / Restrictions Precautions Precautions: Knee;Fall Precaution Comments: Pt educated to not allow any pillow or bolster under knee for healing with optimal range of motion.  Required Braces or Orthoses: Knee Immobilizer - Right Knee Immobilizer - Right: Other (comment)(KI in rm; not ordered, but useful ) Restrictions Weight Bearing Restrictions: Yes RLE Weight Bearing: Weight bearing as tolerated    Mobility  Bed Mobility                  Transfers Overall transfer level: Needs assistance Equipment used: Rolling walker (2 wheeled) Transfers: Sit to/from [employer] transfer comment: mod A +2 to stand from the chair; cues for walker and foot placement during pivot, and she did sit to stand,  stand to sit 2x each  Ambulation/Gait Ambulation/Gait assistance: Mod assist;+2 physical assistance Gait Distance (Feet): 25 Feet Assistive device: Rolling walker (2 wheeled) Gait Pattern/deviations: Decreased stance time - right;Step-to pattern;Decreased weight shift to right;Decreased stride length Gait velocity: decreased   General Gait Details: cues for sequencing, posture, weight shifting and step length; encouragement to push to walk greater distance; cues to work to step-through pattern   Marine [employer] Rankin (Stroke Patients Only)       Balance Overall balance assessment: Needs assistance Sitting-balance support: Feet supported       Standing balance support: Bilateral upper extremity supported;During functional activity                                Cognition Arousal/Alertness: Awake/alert Behavior During Therapy: WFL for tasks assessed/performed Overall Cognitive Status: Within Functional Limits for tasks assessed                                        Exercises      General Comments        Pertinent Vitals/Pain Pain Assessment: Faces Faces Pain Scale: Hurts whole lot Pain Location: R LE with motion Pain Descriptors / Indicators: Aching;Grimacing;Guarding;Sore Pain Intervention(s): Limited activity within patient's tolerance;Monitored during session;Premedicated before session    Home Living  Prior Function            PT Goals (current goals can now be found in the care plan section) Acute Rehab PT Goals Patient Stated Goal: to get out of her room and to walk farther than the previous treatment PT Goal Formulation: With patient Time For Goal Achievement: 08/05/18 Potential to Achieve Goals: Good    Frequency    7X/week      PT Plan Current plan remains appropriate    Co-evaluation              AM-PAC PT "6 Clicks" Daily  Activity  Outcome Measure  Difficulty turning over in bed (including adjusting bedclothes, sheets and blankets)?: A Lot Difficulty moving from lying on back to sitting on the side of the bed? : Unable Difficulty sitting down on and standing up from a chair with arms (e.g., wheelchair, bedside commode, etc,.)?: Unable Help needed moving to and from a bed to chair (including a wheelchair)?: A Lot Help needed walking in hospital room?: A Lot Help needed climbing 3-5 steps with a railing? : Total 6 Click Score: 9    End of Session Equipment Utilized During Treatment: Gait belt;Right knee immobilizer Activity Tolerance: Patient limited by fatigue;Patient limited by pain Patient left: with call bell/phone within reach;with family/visitor present;Other (comment)(sitting on [facility]) Nurse Communication: Mobility status;Other (comment)(ended session with pt on [facility]) PT Visit Diagnosis: Unsteadiness on feet (R26.81);Other abnormalities of gait and mobility (R26.89);Muscle weakness (generalized) (M62.81);Pain Pain - Right/Left: Right Pain - part of body: Knee     Time: 1351-1438 PT Time Calculation (min) (ACUTE ONLY): 47 min  Charges:  $Gait Training: 23-37 mins $Therapeutic Activity: 8-22 mins                     .ghs    Colletta [place] 08/02/2018, 6:46 PM

## 2018-08-02 NOTE — Progress Notes (Signed)
Patient ID: Jenna [name], female   DOB: 06/17/1941, 77 y.o.   MRN: [id number] No acute changes.  Vitals stable.  Right knee stable.  Awaiting approval for short-term skilled nursing placement.

## 2018-08-03 DIAGNOSIS — M1711 Unilateral primary osteoarthritis, right knee: Secondary | ICD-10-CM | POA: Diagnosis not present

## 2018-08-03 NOTE — Plan of Care (Signed)
  Problem: Education: Goal: Knowledge of the prescribed therapeutic regimen will improve Outcome: Progressing   Problem: Activity: Goal: Ability to avoid complications of mobility impairment will improve Outcome: Progressing   Problem: Pain Management: Goal: Pain level will decrease with appropriate interventions Outcome: Progressing   Problem: Skin Integrity: Goal: Will show signs of wound healing Outcome: Progressing   Problem: Activity: Goal: Risk for activity intolerance will decrease Outcome: Progressing

## 2018-08-03 NOTE — Progress Notes (Signed)
Patient to discharge to [facility]. Attempt to reach a nurse at SNF was unsuccessful, was only able to talk to the receptionist x3. Awaiting transportation.

## 2018-08-03 NOTE — Progress Notes (Signed)
Patient will DC to: [facility] Anticipated DC date: 08/03/18 Family notified: Patient is notifying family Transport by: Corey Harold  Per MD patient ready for DC to [facility] . RN, patient, patient's family, and facility notified of DC. Discharge Summary sent to facility. RN given number for report [phone] Room 3218. DC packet on chart. Ambulance transport requested for patient.  CSW signing off.  [place], [place]

## 2018-08-03 NOTE — Clinical Social Work Placement (Signed)
   CLINICAL SOCIAL WORK PLACEMENT  NOTE  Date:  08/03/2018  Patient Details  Name: Jenna [name] MRN: [id number] Date of Birth: 12/09/1940  Clinical Social Work is seeking post-discharge placement for this patient at the [place] level of care (*CSW will initial, date and re-position this form in  chart as items are completed):  Yes   Patient/family provided with [facility] Work Department's list of facilities offering this level of care within the geographic area requested by the patient (or if unable, by the patient's family).  Yes   Patient/family informed of their freedom to choose among providers that offer the needed level of care, that participate in Medicare, Medicaid or managed care program needed by the patient, have an available bed and are willing to accept the patient.      Patient/family informed of [facility]'s ownership interest in [facility] and [facility], as well as of the fact that they are under no obligation to receive care at these facilities.  PASRR submitted to EDS on       PASRR number received on 07/30/18     Existing PASRR number confirmed on       FL2 transmitted to all facilities in geographic area requested by pt/family on 07/30/18     FL2 transmitted to all facilities within larger geographic area on       Patient informed that his/her managed care company has contracts with or will negotiate with certain facilities, including the following:        Yes   Patient/family informed of bed offers received.  Patient chooses bed at [facility]     Physician recommends and patient chooses bed at      Patient to be transferred to [facility] on 08/03/18.  Patient to be transferred to facility by [facility]     Patient family notified on 08/03/18 of transfer.  Name of family member notified:  patient will notify family     PHYSICIAN       Additional Comment:     _______________________________________________ Alberteen Sam, LCSW 08/03/2018, 2:11 PM

## 2018-08-03 NOTE — Progress Notes (Signed)
Patient ID: Jenna [name], female   DOB: [birth year], 76 y.o.   MRN: [id number] No acute changes.  Can be discharged to SNF today.  Knee stable.

## 2018-08-03 NOTE — Progress Notes (Signed)
Physical Therapy Treatment Patient Details Name: Jenna [name] MRN: [id number] DOB: [birth year] Today's Date: 08/03/2018    History of Present Illness Admitted for R TKA, WBAT;  has a pertinent past medical history of Anxiety, CHF (congestive heart failure) ([place]), Chronic lower back pain, Chronic systolic dysfunction of left ventricle, Depression, Dysrhythmia, Fatigue, Mild aortic stenosis, Nonischemic cardiomyopathy ([place]), , S/P ablation of atrial flutter/ fib 01/10/17 (01/11/2017), and Type II diabetes mellitus ([place]).    PT Comments    Continuing work on functional mobility and activity tolerance;  Session focused on therex and assessing knee stabiltiy in stance; Pain continues to significantly limit quad contraction and knee flexion during focused therex; Walked without KI today with no noted knee buckling; Noted for dc to SNF for rehab today   Follow Up Recommendations  Follow surgeon's recommendation for DC plan and follow-up therapies     Equipment Recommendations  Rolling walker with 5" wheels;3in1 (PT)    Recommendations for Other Services       Precautions / Restrictions Precautions Precautions: Knee;Fall Precaution Comments: Pt educated to not allow any pillow or bolster under knee for healing with optimal range of motion.  Knee Immobilizer - Right: Other (comment)(No knee buckling with amb today) Restrictions RLE Weight Bearing: Weight bearing as tolerated    Mobility  Bed Mobility                  Transfers Overall transfer level: Needs assistance Equipment used: Rolling walker (2 wheeled) Transfers: Sit to/from Stand Sit to Stand: Mod assist         General transfer comment: mod A +2 to stand from the chair; cues for walker and foot placement for better tolerance of transition  Ambulation/Gait Ambulation/Gait assistance: Min assist Gait Distance (Feet): 20 Feet Assistive device: Rolling walker (2 wheeled) Gait Pattern/deviations: Decreased stance time -  right;Step-to pattern;Decreased weight shift to right;Decreased stride length Gait velocity: decreased   General Gait Details: Cues for sequence and to activate quad in R sance, as well as posture; Painful still in R stance, but no noted R knee buckle   Stairs             Wheelchair Mobility    Modified Rankin (Stroke Patients Only)       Balance     Sitting balance-Leahy Scale: Fair       Standing balance-Leahy Scale: Poor                              Cognition Arousal/Alertness: Awake/alert Behavior During Therapy: WFL for tasks assessed/performed Overall Cognitive Status: Within Functional Limits for tasks assessed                                        Exercises Total Joint Exercises Quad Sets: AROM;Right;10 reps(weak contraction) Short Arc Quad: AAROM;Right;10 reps Heel Slides: AAROM;Right;5 reps Straight Leg Raises: AAROM;Right;10 reps Goniometric ROM: approx 5-45, significantly limited by pain    General Comments        Pertinent Vitals/Pain Pain Assessment: Faces Faces Pain Scale: Hurts whole lot Pain Location: R LE with motion Pain Descriptors / Indicators: Aching;Grimacing;Guarding;Sore Pain Intervention(s): Monitored during session;Patient requesting pain meds-RN notified    Home Living                      Prior Function  PT Goals (current goals can now be found in the care plan section) Acute Rehab PT Goals Patient Stated Goal: Hopes to get to her rehab venue soon PT Goal Formulation: With patient Time For Goal Achievement: 08/05/18 Potential to Achieve Goals: Good Progress towards PT goals: Progressing toward goals    Frequency    7X/week      PT Plan Current plan remains appropriate    Co-evaluation              AM-PAC PT "6 Clicks" Daily Activity  Outcome Measure  Difficulty turning over in bed (including adjusting bedclothes, sheets and blankets)?: A  Lot Difficulty moving from lying on back to sitting on the side of the bed? : Unable Difficulty sitting down on and standing up from a chair with arms (e.g., wheelchair, bedside commode, etc,.)?: Unable Help needed moving to and from a bed to chair (including a wheelchair)?: A Little Help needed walking in hospital room?: A Little Help needed climbing 3-5 steps with a railing? : Total 6 Click Score: 11    End of Session Equipment Utilized During Treatment: Gait belt Activity Tolerance: Patient tolerated treatment well Patient left: in chair;with call bell/phone within reach Nurse Communication: Mobility status PT Visit Diagnosis: Unsteadiness on feet (R26.81);Other abnormalities of gait and mobility (R26.89);Muscle weakness (generalized) (M62.81);Pain Pain - Right/Left: Right Pain - part of body: Knee     Time: 1330-1413 PT Time Calculation (min) (ACUTE ONLY): 43 min  Charges:  $Gait Training: 8-22 mins $Therapeutic Exercise: 8-22 mins                     Roney Marion, PT  Acute Rehabilitation Services Pager [phone] Office [place] 08/03/2018, 3:02 PM

## 2018-08-03 NOTE — Discharge Summary (Signed)
Patient ID: Jenna [name] MRN: [id number] DOB/AGE: Sep 28, 1941 78 y.o.  Admit date: 07/28/2018 Discharge date: 08/03/2018  Admission Diagnoses:  Principal Problem:   Primary osteoarthritis of right knee Active Problems:   Status post total knee replacement, right   Discharge Diagnoses:  Same  Past Medical History:  Diagnosis Date  . Anxiety   . CHF (congestive heart failure) ([place])   . Chronic lower back pain   . Chronic systolic dysfunction of left ventricle   . Depression   . Dysrhythmia    A-Fib  . Fatigue   . GERD (gastroesophageal reflux disease)   . High cholesterol   . Hypertension   . Hypothyroidism   . Melanoma of shoulder, left ([place])   . Mild aortic stenosis   . Nonischemic cardiomyopathy ([place])   . OSA on CPAP    reports compliance with CPAP  . Osteoarthritis   . Persistent atrial fibrillation with RVR    [name] 11/15/2016  . PONV (postoperative nausea and vomiting)   . RLS (restless legs syndrome)   . S/P ablation of atrial flutter/ fib 01/10/17 01/11/2017  . Type II diabetes mellitus ([place])    no medications    Surgeries: Procedure(s): RIGHT TOTAL KNEE ARTHROPLASTY on 07/28/2018   Consultants:   Discharged Condition: Improved  Hospital Course: Jenna [name] is an 77 y.o. female who was admitted 07/28/2018 for operative treatment ofPrimary osteoarthritis of right knee. Patient has severe unremitting pain that affects sleep, daily activities, and work/hobbies. After pre-op clearance the patient was taken to the operating room on 07/28/2018 and underwent  Procedure(s): RIGHT TOTAL KNEE ARTHROPLASTY.    Patient was given perioperative antibiotics:  Anti-infectives (From admission, onward)   Start     Dose/Rate Route Frequency Ordered Stop   07/28/18 2015  clindamycin (CLEOCIN) IVPB 600 mg     600 mg 100 mL/hr over 30 Minutes Intravenous Every 6 hours 07/28/18 2004 07/29/18 0331   07/28/18 1300  clindamycin (CLEOCIN) IVPB 900 mg     900 mg 100 mL/hr over 30  Minutes Intravenous On call to O.R. 07/28/18 1255 07/28/18 1459       Patient was given sequential compression devices, early ambulation, and chemoprophylaxis to prevent DVT.  Patient benefited maximally from hospital stay and there were no complications.    Recent vital signs:  Patient Vitals for the past 24 hrs:  BP Temp Temp src Pulse Resp SpO2  08/03/18 0321 118/63 98.2 F (36.8 C) Oral 72 - 96 %  08/02/18 2024 117/68 99.5 F (37.5 C) Oral 83 - 95 %  08/02/18 1617 114/60 98.8 F (37.1 C) Oral 73 16 98 %     Recent laboratory studies: No results for input(s): WBC, HGB, HCT, PLT, NA, K, CL, CO2, BUN, CREATININE, GLUCOSE, INR, CALCIUM in the last 72 hours.  Invalid input(s): PT, 2   Discharge Medications:   Allergies as of 08/03/2018      Reactions   Amoxicillin Nausea Only   Crestor [rosuvastatin] Other (See Comments)   Joints ached   Lovastatin Other (See Comments)   Joints hurt all over body       Medication List    STOP taking these medications   traMADol 50 MG tablet Commonly known as:  ULTRAM     TAKE these medications   acetaminophen 500 MG tablet Commonly known as:  TYLENOL Take 500-1,000 mg by mouth every 6 (six) hours as needed for mild pain, moderate pain or headache.   ALPRAZolam 0.5 MG  tablet Commonly known as:  XANAX Take 0.5 mg by mouth 2 (two) times daily.   atorvastatin 40 MG tablet Commonly known as:  LIPITOR take 20 mg three days a week, MON, WED and FRI What changed:    how much to take  how to take this  when to take this  additional instructions   buPROPion 150 MG 24 hr tablet Commonly known as:  WELLBUTRIN XL Take 300 mg by mouth daily.   CALCIUM + D PO Take 1 tablet by mouth 2 (two) times daily.   carvedilol 6.25 MG tablet Commonly known as:  COREG Take 1 tablet (6.25 mg total) by mouth 2 (two) times daily.   ELIQUIS 5 MG Tabs tablet Generic drug:  apixaban TAKE 1 TABLET BY MOUTH TWICE A DAY What changed:  how much  to take   furosemide 40 MG tablet Commonly known as:  LASIX Take 40 mg by mouth daily.   levothyroxine 88 MCG tablet Commonly known as:  SYNTHROID, LEVOTHROID Take 88 mcg by mouth daily before breakfast.   Magnesium 500 MG Tabs Take 500 mg by mouth daily.   methocarbamol 500 MG tablet Commonly known as:  ROBAXIN Take 1 tablet (500 mg total) by mouth every 6 (six) hours as needed for muscle spasms.   omeprazole 40 MG capsule Commonly known as:  PRILOSEC Take 40 mg by mouth daily.   oxyCODONE 5 MG immediate release tablet Commonly known as:  Oxy IR/ROXICODONE Take 1-2 tablets (5-10 mg total) by mouth every 3 (three) hours as needed for moderate pain (pain score 4-6).   potassium chloride SA 20 MEQ tablet Commonly known as:  K-DUR,KLOR-CON Take 2 tablets (40 mEq total) by mouth 2 (two) times daily.   PRESERVISION AREDS 2 Caps Take 1 capsule by mouth 2 (two) times daily.   pyridoxine 100 MG tablet Commonly known as:  B-6 Take 100 mg by mouth daily with supper.   rOPINIRole 0.5 MG tablet Commonly known as:  REQUIP Take 2-3 daily at bedtime as needed What changed:    how much to take  how to take this  when to take this  additional instructions   SYSTANE 0.4-0.3 % Soln Generic drug:  Polyethyl Glycol-Propyl Glycol Place 2 drops into both eyes 4 (four) times daily.   vitamin B-12 1000 MCG tablet Commonly known as:  CYANOCOBALAMIN Take 1,000 mcg by mouth daily.            Durable Medical Equipment  (From admission, onward)         Start     Ordered   07/28/18 2005  DME 3 n 1  Once     07/28/18 2004   07/28/18 2005  DME Walker rolling  Once    Question:  Patient needs a walker to treat with the following condition  Answer:  Status post total knee replacement, right   07/28/18 2004          Diagnostic Studies: Dg Knee Right Port  Result Date: 07/28/2018 CLINICAL DATA:  Status post RIGHT total knee arthroplasty. EXAM: PORTABLE RIGHT KNEE - 1-2  VIEW COMPARISON:  RIGHT knee radiograph August 30, 2015 FINDINGS: Status post total knee arthroplasty with intact well seated femoral and tibial components, expected appearance of the resurfaced patella. No acute fracture deformity. No dislocation. No destructive bony lesions. Small suprapatellar effusion. Subcutaneous emphysema consistent with recent surgery with overlying skin staples. IMPRESSION: 1. Status post recent total knee arthroplasty with expected postoperative change. Electronically Signed  By: Elon Alas M.D.   On: 07/28/2018 19:56    Disposition: Discharge disposition: 03-Skilled [place] information for follow-up providers    Mcarthur Rossetti, MD Follow up in 2 week(s).   Specialty:  Orthopedic Surgery Contact information: [place] [address] [phone]            Contact information for after-discharge care    Destination    [facility] Preferred SNF .   Service:  Skilled Nursing Contact information: [place] [phone]                   Signed: Mcarthur Rossetti 08/03/2018, 7:47 AM

## 2018-08-03 NOTE — Plan of Care (Signed)
  Problem: Education: Goal: Knowledge of the prescribed therapeutic regimen will improve Outcome: Progressing Goal: Individualized Educational Video(s) Outcome: Progressing   

## 2018-08-03 NOTE — Progress Notes (Signed)
CSW consulted with [facility]'s rep regarding insurance authorization. Facility reports they have put a rush on the insurance authorization but have not received it yet. CSW will continue to follow up.   [place], [place]

## 2018-08-11 ENCOUNTER — Encounter (INDEPENDENT_AMBULATORY_CARE_PROVIDER_SITE_OTHER): Payer: Self-pay | Admitting: Orthopaedic Surgery

## 2018-08-11 ENCOUNTER — Ambulatory Visit (INDEPENDENT_AMBULATORY_CARE_PROVIDER_SITE_OTHER): Payer: Medicare Other | Admitting: Orthopaedic Surgery

## 2018-08-11 DIAGNOSIS — Z96651 Presence of right artificial knee joint: Secondary | ICD-10-CM

## 2018-08-11 NOTE — Progress Notes (Signed)
HPI: [name] returns today 2 weeks status post right total knee arthroplasty.  She is at [facility] facility currently.  She states she is working with physical therapy she feels she is progressing slowly.  She had no chest pain shortness of breath fevers chills or nausea or vomiting.  Physical exam: General: Well-developed well-nourished female no acute distress mood affect appropriate Right knee surgical incisions well approximated with subcutaneous stitch no signs of wound dehiscence or infection.  Right calf supple nontender.  She has full extension and flexion to approximately 80 degrees.  Dorsiflexion plantarflexion of the right ankle intact.  Impression: Status post right total knee arthroplasty 2 weeks  Plan: Have her continue work with therapy on range of motion strengthening.  Discussed scar tissue mobilization with her.  See her back in 1 month sooner if there is any questions or concerns or wound problems.  Explained to her that would like to see her to 100 110 degrees of flexion at return.

## 2018-08-24 ENCOUNTER — Ambulatory Visit: Payer: Medicare Other | Admitting: Adult Health

## 2018-08-25 ENCOUNTER — Telehealth (INDEPENDENT_AMBULATORY_CARE_PROVIDER_SITE_OTHER): Payer: Self-pay | Admitting: Orthopaedic Surgery

## 2018-08-25 NOTE — Telephone Encounter (Signed)
Costella Hatcher, PT, with [facility] called stating that the patient has done really well and does not feel like she needs OT, but is requesting VO for an aid to assist her with showering.  CB#[phone].  Thank you.

## 2018-08-25 NOTE — Telephone Encounter (Signed)
Verbal order given  

## 2018-08-25 NOTE — Telephone Encounter (Signed)
Adriane from [facility] called to request VO for the following  1x a week for 2 weeks 1x a week for 3 weeks 1x a week for 2 weeks  For [facility] PT.  CB#[phone].  Thank you.

## 2018-08-26 NOTE — Telephone Encounter (Signed)
Verbal order left on VM  

## 2018-08-27 ENCOUNTER — Other Ambulatory Visit: Payer: Self-pay | Admitting: Internal Medicine

## 2018-09-08 ENCOUNTER — Ambulatory Visit (INDEPENDENT_AMBULATORY_CARE_PROVIDER_SITE_OTHER): Payer: Medicare Other | Admitting: Orthopaedic Surgery

## 2018-09-08 ENCOUNTER — Encounter (INDEPENDENT_AMBULATORY_CARE_PROVIDER_SITE_OTHER): Payer: Self-pay | Admitting: Orthopaedic Surgery

## 2018-09-08 DIAGNOSIS — Z96651 Presence of right artificial knee joint: Secondary | ICD-10-CM

## 2018-09-08 NOTE — Progress Notes (Signed)
The patient is now 6 weeks status post a right total knee arthroplasty.  She is doing well overall.  She is ready to transition to outpatient physical therapy.  She is also feeling comfortable to drive.  She still getting around with a walker but does go without it on occasion.  She is an active 77 years old.  On exam she still lacks full extension by about 30 degrees.  I can flex her to 95 degrees in the office today.  The knee feels stable.  I gave her a prescription for outpatient physical therapy near her house in [place].  She is only taking some tramadol at night.  She will continue increase her activities as comfort allows.  She can drive from our standpoint.  We will see her back in 4 weeks to see how she is doing overall and for repeat exam but no x-rays are needed.

## 2018-09-13 ENCOUNTER — Other Ambulatory Visit: Payer: Self-pay

## 2018-09-13 ENCOUNTER — Encounter (HOSPITAL_COMMUNITY): Payer: Self-pay

## 2018-09-13 ENCOUNTER — Emergency Department (HOSPITAL_COMMUNITY)
Admission: EM | Admit: 2018-09-13 | Discharge: 2018-09-13 | Disposition: A | Discharge status: Home / Self Care | Payer: Medicare Other | Attending: Emergency Medicine | Admitting: Emergency Medicine

## 2018-09-13 ENCOUNTER — Emergency Department (HOSPITAL_COMMUNITY): Payer: Medicare Other

## 2018-09-13 DIAGNOSIS — Z79899 Other long term (current) drug therapy: Secondary | ICD-10-CM | POA: Insufficient documentation

## 2018-09-13 DIAGNOSIS — W109XXA Fall (on) (from) unspecified stairs and steps, initial encounter: Secondary | ICD-10-CM | POA: Insufficient documentation

## 2018-09-13 DIAGNOSIS — S0990XA Unspecified injury of head, initial encounter: Secondary | ICD-10-CM | POA: Insufficient documentation

## 2018-09-13 DIAGNOSIS — Y9301 Activity, walking, marching and hiking: Secondary | ICD-10-CM | POA: Diagnosis not present

## 2018-09-13 DIAGNOSIS — R0789 Other chest pain: Secondary | ICD-10-CM | POA: Insufficient documentation

## 2018-09-13 DIAGNOSIS — E039 Hypothyroidism, unspecified: Secondary | ICD-10-CM | POA: Insufficient documentation

## 2018-09-13 DIAGNOSIS — I5022 Chronic systolic (congestive) heart failure: Secondary | ICD-10-CM | POA: Insufficient documentation

## 2018-09-13 DIAGNOSIS — R51 Headache: Secondary | ICD-10-CM | POA: Insufficient documentation

## 2018-09-13 DIAGNOSIS — S0083XA Contusion of other part of head, initial encounter: Secondary | ICD-10-CM | POA: Insufficient documentation

## 2018-09-13 DIAGNOSIS — E119 Type 2 diabetes mellitus without complications: Secondary | ICD-10-CM | POA: Diagnosis not present

## 2018-09-13 DIAGNOSIS — Y9222 Religious institution as the place of occurrence of the external cause: Secondary | ICD-10-CM | POA: Insufficient documentation

## 2018-09-13 DIAGNOSIS — I10 Essential (primary) hypertension: Secondary | ICD-10-CM | POA: Diagnosis not present

## 2018-09-13 DIAGNOSIS — T07XXXA Unspecified multiple injuries, initial encounter: Secondary | ICD-10-CM | POA: Diagnosis present

## 2018-09-13 DIAGNOSIS — Y999 Unspecified external cause status: Secondary | ICD-10-CM | POA: Insufficient documentation

## 2018-09-13 LAB — CBC WITH DIFFERENTIAL/PLATELET
Abs Immature Granulocytes: 0.02 10*3/uL (ref 0.00–0.07)
Basophils Absolute: 0.1 10*3/uL (ref 0.0–0.1)
Basophils Relative: 1 %
Eosinophils Absolute: 0.3 10*3/uL (ref 0.0–0.5)
Eosinophils Relative: 4 %
HCT: 36.3 % (ref 36.0–46.0)
Hemoglobin: 11.3 g/dL — ABNORMAL LOW (ref 12.0–15.0)
Immature Granulocytes: 0 %
Lymphocytes Relative: 26 %
Lymphs Abs: 1.7 10*3/uL (ref 0.7–4.0)
MCH: 28.4 pg (ref 26.0–34.0)
MCHC: 31.1 g/dL (ref 30.0–36.0)
MCV: 91.2 fL (ref 80.0–100.0)
Monocytes Absolute: 0.7 10*3/uL (ref 0.1–1.0)
Monocytes Relative: 10 %
Neutro Abs: 3.9 10*3/uL (ref 1.7–7.7)
Neutrophils Relative %: 59 %
Platelets: 368 10*3/uL (ref 150–400)
RBC: 3.98 MIL/uL (ref 3.87–5.11)
RDW: 13.2 % (ref 11.5–15.5)
WBC: 6.7 10*3/uL (ref 4.0–10.5)
nRBC: 0 % (ref 0.0–0.2)

## 2018-09-13 LAB — I-STAT TROPONIN, ED: Troponin i, poc: 0 ng/mL (ref 0.00–0.08)

## 2018-09-13 LAB — BASIC METABOLIC PANEL
Anion gap: 6 (ref 5–15)
BUN: 22 mg/dL (ref 8–23)
CO2: 25 mmol/L (ref 22–32)
Calcium: 8.8 mg/dL — ABNORMAL LOW (ref 8.9–10.3)
Chloride: 105 mmol/L (ref 98–111)
Creatinine, Ser: 0.84 mg/dL (ref 0.44–1.00)
GFR calc Af Amer: >60 mL/min (ref >60)
GFR calc non Af Amer: >60 mL/min (ref >60)
Glucose, Bld: 92 mg/dL (ref 70–99)
Potassium: 4 mmol/L (ref 3.5–5.1)
Sodium: 136 mmol/L (ref 135–145)

## 2018-09-13 NOTE — ED Provider Notes (Signed)
[facility] DEPARTMENT Provider Note   CSN: [id number] Arrival date & time: 09/13/18  1043     History   Chief Complaint Chief Complaint  Patient presents with  . Fall    HPI Jenna [name] is a 77 y.o. female.  She tripped while walking [place] the steps at church on the last step and fell to the tile floor where she struck her head face.  No LOC.  She is on blood thinners.  She is complaining of a 2 out of 10 frontal head and face pain along with some pain in the right side of her chest.  She thinks she might of struck her chest going down.  No numbness no weakness no vomiting no blurry vision or double vision.  No shortness of breath.  The history is provided by the patient.  Fall  This is a new problem. The current episode started less than 1 hour ago. The problem has not changed since onset.Associated symptoms include chest pain and headaches. Pertinent negatives include no abdominal pain and no shortness of breath. Nothing aggravates the symptoms. Nothing relieves the symptoms. She has tried nothing for the symptoms. The treatment provided no relief.    Past Medical History:  Diagnosis Date  . Anxiety   . CHF (congestive heart failure) ([place])   . Chronic lower back pain   . Chronic systolic dysfunction of left ventricle   . Depression   . Dysrhythmia    A-Fib  . Fatigue   . GERD (gastroesophageal reflux disease)   . High cholesterol   . Hypertension   . Hypothyroidism   . Melanoma of shoulder, left ([place])   . Mild aortic stenosis   . Nonischemic cardiomyopathy ([place])   . OSA on CPAP    reports compliance with CPAP  . Osteoarthritis   . Persistent atrial fibrillation with RVR    [name] 11/15/2016  . PONV (postoperative nausea and vomiting)   . RLS (restless legs syndrome)   . S/P ablation of atrial flutter/ fib 01/10/17 01/11/2017  . Type II diabetes mellitus (HCC)    no medications    Patient Active Problem List   Diagnosis Date Noted  .  Status post total knee replacement, right 07/28/2018  . Primary osteoarthritis of right knee 12/08/2017  . Nonischemic cardiomyopathy ([place]) 05/14/2017  . S/P ablation of atrial flutter/ fib 01/10/17 01/11/2017  . A-fib ([place]) 01/10/2017  . Weakness 12/17/2016  . Acute kidney injury superimposed on chronic kidney disease ([place]) 12/17/2016  . Hyperglycemia 12/17/2016  . Normocytic anemia 12/17/2016  . Acute kidney injury ([place]) 12/08/2016  . Acute on chronic systolic congestive heart failure ([place]) 12/08/2016  . Hyperkalemia 12/08/2016  . Cystitis   . Persistent atrial fibrillation 11/15/2016  . Depressed 07/04/2015  . Peripheral vertigo 12/16/2014  . Vertigo 12/16/2014  . Aortic valve stenosis 07/26/2013  . HTN (hypertension) 07/26/2013  . Hypothyroidism 07/26/2013  . PAF (paroxysmal atrial fibrillation) ([place]) 07/26/2013  . Anxiety 07/26/2013  . Aortic valve disorder 07/26/2013  . GERD (gastroesophageal reflux disease) 07/26/2013  . OA (osteoarthritis) 07/26/2013  . Obstructive sleep apnea 06/20/2008  . RESTLESS LEGS SYNDROME 06/20/2008    Past Surgical History:  Procedure Laterality Date  . ATRIAL FIBRILLATION ABLATION N/A 01/10/2017   Procedure: Atrial Fibrillation Ablation;  Surgeon: Thompson Grayer, MD;  Location: [facility] CV LAB;  Service: Cardiovascular;  Laterality: N/A;  . CARDIOVERSION N/A 11/19/2016   Procedure: CARDIOVERSION;  Surgeon: Lelon Perla, MD;  Location:  [facility] ENDOSCOPY;  Service: Cardiovascular;  Laterality: N/A;  . CARDIOVERSION N/A 12/11/2016   Procedure: CARDIOVERSION;  Surgeon: Dorothy Spark, MD;  Location: [facility];  Service: Cardiovascular;  Laterality: N/A;  . CARDIOVERSION N/A 12/18/2016   Procedure: CARDIOVERSION;  Surgeon: Skeet Latch, MD;  Location: [facility] ENDOSCOPY;  Service: Cardiovascular;  Laterality: N/A;  . CATARACT EXTRACTION W/ INTRAOCULAR LENS  IMPLANT, BILATERAL Bilateral   . DILATION AND CURETTAGE OF UTERUS    . LEXISCAN MYOVIEW   05/13/2012   No ECG changes. EKG negative for ischemia. No significant ischemia demonstrated.  [name] MELANOMA EXCISION Left ~ 09/1979   "back of my shoulder"  . TEE WITHOUT CARDIOVERSION N/A 11/19/2016   Procedure: TRANSESOPHAGEAL ECHOCARDIOGRAM (TEE);  Surgeon: Lelon Perla, MD;  Location: [facility] ENDOSCOPY;  Service: Cardiovascular;  Laterality: N/A;  need CV, but no anesthesia available  . TEE WITHOUT CARDIOVERSION N/A 01/10/2017   Procedure: TRANSESOPHAGEAL ECHOCARDIOGRAM (TEE);  Surgeon: [name], MD;  Location[facility] ENDOSCOPY;  Service: Cardiovascular;  Laterality: N/A;  . TOTAL KNEE ARTHROPLASTY Right 07/28/2018   Procedure: RIGHT TOTAL KNEE ARTHROPLASTY;  Surgeon: Mcarthur Rossetti, MD;  Location: [facility];  Service: Orthopedics;  Laterality: Right;  . TRANSTHORACIC ECHOCARDIOGRAM  02/18/2012   EF >55%, mild aortic stenosis  . VAGINAL HYSTERECTOMY  1985     OB History   None      Home Medications    Prior to Admission medications   Medication Sig Start Date End Date Taking? Authorizing Provider  acetaminophen (TYLENOL) 500 MG tablet Take 500-1,000 mg by mouth every 6 (six) hours as needed for mild pain, moderate pain or headache.    [provider]  ALPRAZolam Duanne Moron) 0.5 MG tablet Take 0.5 mg by mouth 2 (two) times daily.  07/05/16   [provider]  atorvastatin (LIPITOR) 40 MG tablet take 20 mg three days a week, MON, WED and FRI Patient taking differently: Take 20 mg by mouth every Monday, Wednesday, and Friday.  06/30/18   Hilty, Nadean Corwin, MD  buPROPion (WELLBUTRIN XL) 150 MG 24 hr tablet Take 300 mg by mouth daily.  08/07/15   [provider]  Calcium Carbonate-Vitamin D (CALCIUM + D PO) Take 1 tablet by mouth 2 (two) times daily.     [provider]  carvedilol (COREG) 6.25 MG tablet Take 1 tablet (6.25 mg total) by mouth 2 (two) times daily. 12/27/16   Meng, Isaac Laud, PA  ELIQUIS 5 MG TABS tablet TAKE 1 TABLET BY MOUTH TWICE A DAY Patient  taking differently: Take 5 mg by mouth 2 (two) times daily.  07/06/18   Hilty, Nadean Corwin, MD  furosemide (LASIX) 40 MG tablet Take 40 mg by mouth daily.  09/24/17   [provider]  KLOR-CON M20 20 MEQ tablet TAKE 2 TABLETS (40 MEQ TOTAL) BY MOUTH 2 (TWO) TIMES DAILY. 08/27/18   Hilty, Nadean Corwin, MD  levothyroxine (SYNTHROID, LEVOTHROID) 88 MCG tablet Take 88 mcg by mouth daily before breakfast.    [provider]  Magnesium 500 MG TABS Take 500 mg by mouth daily.     [provider]  methocarbamol (ROBAXIN) 500 MG tablet Take 1 tablet (500 mg total) by mouth every 6 (six) hours as needed for muscle spasms. 07/30/18   Mcarthur Rossetti, MD  Multiple Vitamins-Minerals (PRESERVISION AREDS 2) CAPS Take 1 capsule by mouth 2 (two) times daily.    [provider]  omeprazole (PRILOSEC) 40 MG capsule Take 40 mg by mouth  daily.    [provider]  oxyCODONE (OXY IR/ROXICODONE) 5 MG immediate release tablet Take 1-2 tablets (5-10 mg total) by mouth every 3 (three) hours as needed for moderate pain (pain score 4-6). 07/30/18   Mcarthur Rossetti, MD  Polyethyl Glycol-Propyl Glycol (SYSTANE) 0.4-0.3 % SOLN Place 2 drops into both eyes 4 (four) times daily.    [provider]  pyridoxine (B-6) 100 MG tablet Take 100 mg by mouth daily with supper.     [provider]  rOPINIRole (REQUIP) 0.5 MG tablet Take 2-3 daily at bedtime as needed Patient taking differently: Take 0.5 mg by mouth 3 (three) times daily.  09/20/14   ClanceArmando Reichert, MD  vitamin B-12 (CYANOCOBALAMIN) 1000 MCG tablet Take 1,000 mcg by mouth daily.    [provider]    Family History Family History  Problem Relation Age of Onset  . Hypertension Mother   . Diabetes Mother   . Heart failure Father   . Arrhythmia Sister   . Hyperlipidemia Sister   . Hypertension Sister   . Healthy Brother   . Stroke Paternal Grandmother   . Cancer Paternal Grandfather   .  Healthy Daughter   . Hypertension Son   . Hyperlipidemia Son     Social History Social History   Tobacco Use  . Smoking status: Never Smoker  . Smokeless tobacco: Never Used  Substance Use Topics  . Alcohol use: No  . Drug use: Never     Allergies   Amoxicillin; Crestor [rosuvastatin]; and Lovastatin   Review of Systems Review of Systems  Constitutional: Negative for fever.  HENT: Negative for sore throat.   Eyes: Negative for visual disturbance.  Respiratory: Negative for shortness of breath.   Cardiovascular: Positive for chest pain.  Gastrointestinal: Negative for abdominal pain.  Genitourinary: Negative for dysuria.  Musculoskeletal: Negative for back pain and neck pain.  Skin: Negative for rash.  Neurological: Positive for headaches. Negative for seizures.     Physical Exam Updated Vital Signs BP (!) 162/80 (BP Location: Right Arm)   Pulse 93   Resp 19   Ht 5\' 6"  (1.676 m)   Wt 73.5 kg   SpO2 99%   BMI 26.15 kg/m   Physical Exam  Constitutional: She is oriented to person, place, and time. She appears well-developed and well-nourished. No distress.  HENT:  Head: Normocephalic.  She has some swelling and bruising around her right eye.  Pupils are equal round reactive to light.  There is no blood in the anterior chamber no subconjunctival hemorrhage.  Eyes: Pupils are equal, round, and reactive to light. Conjunctivae and EOM are normal.  Neck: No tracheal deviation present.  No posterior midline tenderness.  Currently in c-collar.  Cardiovascular: Normal rate. An irregularly irregular rhythm present.  No murmur heard. Pulmonary/Chest: Effort normal and breath sounds normal. No respiratory distress. She exhibits tenderness.    Abdominal: Soft. There is no tenderness.  Musculoskeletal: Normal range of motion. She exhibits no tenderness or deformity.  r leg more swollen, on e that recently had knee surgery. Baseline per patient.   Neurological: She is  alert and oriented to person, place, and time. She has normal strength. No cranial nerve deficit or sensory deficit. GCS eye subscore is 4. GCS verbal subscore is 5. GCS motor subscore is 6.  Skin: Skin is warm and dry.  Psychiatric: She has a normal mood and affect.  Nursing note and vitals reviewed.    ED Treatments /  Results  Labs (all labs ordered are listed, but only abnormal results are displayed) Labs Reviewed  BASIC METABOLIC PANEL - Abnormal; Notable for the following components:      Result Value   Calcium 8.8 (*)    All other components within normal limits  CBC WITH DIFFERENTIAL/PLATELET - Abnormal; Notable for the following components:   Hemoglobin 11.3 (*)    All other components within normal limits  I-STAT TROPONIN, ED    EKG EKG Interpretation  Date/Time:  Sunday September 13 2018 10:47:02 EST Ventricular Rate:  61 PR Interval:    QRS Duration: 98 QT Interval:  387 QTC Calculation: 390 R Axis:   -13 Text Interpretation:  Atrial fibrillation Low voltage, precordial leads similar pattern to prior 7/19 Confirmed by Aletta Edouard [phone]) on 09/13/2018 10:57:12 AM   Radiology Dg Chest 2 View  Result Date: 09/13/2018 CLINICAL DATA:  Chest wall pain. EXAM: CHEST - 2 VIEW COMPARISON:  01/26/2018 FINDINGS: There is no focal parenchymal opacity. There is no pleural effusion or pneumothorax. There is stable cardiomegaly. The osseous structures are unremarkable. IMPRESSION: No active cardiopulmonary disease. Electronically Signed   By: Kathreen Devoid   On: 09/13/2018 12:15   Ct Head Wo Contrast  Result Date: 09/13/2018 CLINICAL DATA:  Status post fall, blunt facial trauma EXAM: CT HEAD WITHOUT CONTRAST CT MAXILLOFACIAL WITHOUT CONTRAST CT CERVICAL SPINE WITHOUT CONTRAST TECHNIQUE: Multidetector CT imaging of the head, cervical spine, and maxillofacial structures were performed using the standard protocol without intravenous contrast. Multiplanar CT image reconstructions of  the cervical spine and maxillofacial structures were also generated. COMPARISON:  12/16/2014 FINDINGS: CT HEAD FINDINGS Brain: No evidence of acute infarction, hemorrhage, extra-axial collection, ventriculomegaly, or mass effect. Generalized cerebral atrophy. Periventricular white matter low attenuation likely secondary to microangiopathy. Vascular: Cerebrovascular atherosclerotic calcifications are noted. Skull: Negative for fracture or focal lesion. Sinuses/Orbits: Visualized portions of the orbits are unremarkable. Visualized portions of the paranasal sinuses and mastoid air cells are unremarkable. Other: None. CT MAXILLOFACIAL FINDINGS Osseous: No fracture or mandibular dislocation. No destructive process. Orbits: Negative. No traumatic or inflammatory finding. Sinuses: Mild mucosal thickening the left maxillary sinus. Soft tissues: Right periorbital soft tissue swelling. CT CERVICAL SPINE FINDINGS Alignment: Normal. Skull base and vertebrae: No acute fracture. No primary bone lesion or focal pathologic process. Soft tissues and spinal canal: No prevertebral fluid or swelling. No visible canal hematoma. Disc levels: Degenerative disc disease with disc height loss at C4-5, C5-6 and C6-7. Mild bilateral facet arthropathy at C2-3. Severe left facet arthropathy at C3-4. Mild bilateral foraminal stenosis at C4-5. Moderate bilateral facet arthropathy C7-T1 and T1-2. Upper chest: Mild biapical scarring. Other: No fluid collection or hematoma. Bilateral carotid artery atherosclerosis. IMPRESSION: 1. No acute intracranial pathology. 2.  No acute osseous injury of the maxillofacial bones. 3.  No acute osseous injury of the cervical spine. 4. Cervical spine spondylosis as described above. 5. Right periorbital soft tissue swelling. Electronically Signed   By: Kathreen Devoid   On: 09/13/2018 13:06   Ct Cervical Spine Wo Contrast  Result Date: 09/13/2018 CLINICAL DATA:  Status post fall, blunt facial trauma EXAM: CT HEAD  WITHOUT CONTRAST CT MAXILLOFACIAL WITHOUT CONTRAST CT CERVICAL SPINE WITHOUT CONTRAST TECHNIQUE: Multidetector CT imaging of the head, cervical spine, and maxillofacial structures were performed using the standard protocol without intravenous contrast. Multiplanar CT image reconstructions of the cervical spine and maxillofacial structures were also generated. COMPARISON:  12/16/2014 FINDINGS: CT HEAD FINDINGS Brain: No evidence of acute infarction, hemorrhage,  extra-axial collection, ventriculomegaly, or mass effect. Generalized cerebral atrophy. Periventricular white matter low attenuation likely secondary to microangiopathy. Vascular: Cerebrovascular atherosclerotic calcifications are noted. Skull: Negative for fracture or focal lesion. Sinuses/Orbits: Visualized portions of the orbits are unremarkable. Visualized portions of the paranasal sinuses and mastoid air cells are unremarkable. Other: None. CT MAXILLOFACIAL FINDINGS Osseous: No fracture or mandibular dislocation. No destructive process. Orbits: Negative. No traumatic or inflammatory finding. Sinuses: Mild mucosal thickening the left maxillary sinus. Soft tissues: Right periorbital soft tissue swelling. CT CERVICAL SPINE FINDINGS Alignment: Normal. Skull base and vertebrae: No acute fracture. No primary bone lesion or focal pathologic process. Soft tissues and spinal canal: No prevertebral fluid or swelling. No visible canal hematoma. Disc levels: Degenerative disc disease with disc height loss at C4-5, C5-6 and C6-7. Mild bilateral facet arthropathy at C2-3. Severe left facet arthropathy at C3-4. Mild bilateral foraminal stenosis at C4-5. Moderate bilateral facet arthropathy C7-T1 and T1-2. Upper chest: Mild biapical scarring. Other: No fluid collection or hematoma. Bilateral carotid artery atherosclerosis. IMPRESSION: 1. No acute intracranial pathology. 2.  No acute osseous injury of the maxillofacial bones. 3.  No acute osseous injury of the cervical  spine. 4. Cervical spine spondylosis as described above. 5. Right periorbital soft tissue swelling. Electronically Signed   By: Kathreen Devoid   On: 09/13/2018 13:06   Ct Maxillofacial Wo Cm  Result Date: 09/13/2018 CLINICAL DATA:  Status post fall, blunt facial trauma EXAM: CT HEAD WITHOUT CONTRAST CT MAXILLOFACIAL WITHOUT CONTRAST CT CERVICAL SPINE WITHOUT CONTRAST TECHNIQUE: Multidetector CT imaging of the head, cervical spine, and maxillofacial structures were performed using the standard protocol without intravenous contrast. Multiplanar CT image reconstructions of the cervical spine and maxillofacial structures were also generated. COMPARISON:  12/16/2014 FINDINGS: CT HEAD FINDINGS Brain: No evidence of acute infarction, hemorrhage, extra-axial collection, ventriculomegaly, or mass effect. Generalized cerebral atrophy. Periventricular white matter low attenuation likely secondary to microangiopathy. Vascular: Cerebrovascular atherosclerotic calcifications are noted. Skull: Negative for fracture or focal lesion. Sinuses/Orbits: Visualized portions of the orbits are unremarkable. Visualized portions of the paranasal sinuses and mastoid air cells are unremarkable. Other: None. CT MAXILLOFACIAL FINDINGS Osseous: No fracture or mandibular dislocation. No destructive process. Orbits: Negative. No traumatic or inflammatory finding. Sinuses: Mild mucosal thickening the left maxillary sinus. Soft tissues: Right periorbital soft tissue swelling. CT CERVICAL SPINE FINDINGS Alignment: Normal. Skull base and vertebrae: No acute fracture. No primary bone lesion or focal pathologic process. Soft tissues and spinal canal: No prevertebral fluid or swelling. No visible canal hematoma. Disc levels: Degenerative disc disease with disc height loss at C4-5, C5-6 and C6-7. Mild bilateral facet arthropathy at C2-3. Severe left facet arthropathy at C3-4. Mild bilateral foraminal stenosis at C4-5. Moderate bilateral facet arthropathy  C7-T1 and T1-2. Upper chest: Mild biapical scarring. Other: No fluid collection or hematoma. Bilateral carotid artery atherosclerosis. IMPRESSION: 1. No acute intracranial pathology. 2.  No acute osseous injury of the maxillofacial bones. 3.  No acute osseous injury of the cervical spine. 4. Cervical spine spondylosis as described above. 5. Right periorbital soft tissue swelling. Electronically Signed   By: Kathreen Devoid   On: 09/13/2018 13:06    Procedures Procedures (including critical care time)  Medications Ordered in ED Medications - No data to display   Initial Impression / Assessment and Plan / ED Course  I have reviewed the triage vital signs and the nursing notes.  Pertinent labs & imaging results that were available during my care of the patient were reviewed  by me and considered in my medical decision making (see chart for details).  Clinical Course as of Sep 14 932  Nancy Fetter Sep 13, 2018  1451 Patient was able to ambulate here with a little bit of assistance.  She is comfortable going home and her imaging and lab work did not show anything obvious that she needs to be admitted for.  She understands to return if any worsening symptoms.   [MB]    Clinical Course User Index [MB] Hayden Rasmussen, MD     Final Clinical Impressions(s) / ED Diagnoses   Final diagnoses:  Contusion of face, initial encounter  Injury of head, initial encounter  Chest wall pain    ED Discharge Orders    None       Hayden Rasmussen, MD 09/14/18 [phone]

## 2018-09-13 NOTE — ED Notes (Signed)
Patient transported to X-ray 

## 2018-09-13 NOTE — ED Notes (Signed)
Patient ambulated in the hallway.  Oxygen saturation remained above 99% while during ambulation.

## 2018-09-13 NOTE — ED Triage Notes (Signed)
Pt fell down last step flling on face and struck head on floor. Ambulatory on scene. . Hematoma noted right eye. Takes elliquis. C/o right upper chest pain.

## 2018-09-13 NOTE — ED Notes (Signed)
Pt given po fluids after discussed with Dr. Melina Copa. Tolerates well.

## 2018-09-13 NOTE — ED Notes (Signed)
Pt ambulates in hallway with tech assist

## 2018-09-13 NOTE — Discharge Instructions (Addendum)
You were evaluated in the emergency department for injuries from a fall today.  You had a CAT scan of your head and cervical spine along with a chest x-ray and blood work.  There were no obvious serious findings.  You should take Tylenol for pain and use ice to your bruised areas.  Follow-up with your doctor and return if any worsening symptoms.

## 2018-10-06 ENCOUNTER — Ambulatory Visit (INDEPENDENT_AMBULATORY_CARE_PROVIDER_SITE_OTHER): Payer: Medicare Other | Admitting: Orthopaedic Surgery

## 2018-10-06 ENCOUNTER — Encounter (INDEPENDENT_AMBULATORY_CARE_PROVIDER_SITE_OTHER): Payer: Self-pay | Admitting: Orthopaedic Surgery

## 2018-10-06 DIAGNOSIS — Z96651 Presence of right artificial knee joint: Secondary | ICD-10-CM

## 2018-10-06 NOTE — Progress Notes (Signed)
Patient is a very pleasant 77 year old female who is now about 9 weeks status post a right total knee arthroplasty.  She says she is doing well.  She does use a cane when she ambulates mainly for her back.  She does take an occasional tramadol at night.  Therapy has released her as of yesterday.  She has been still having some swelling in her leg.  On exam her right leg is swollen but her calf is soft.  She moves her foot and ankle easily.  She lacks full extension by maybe 2 to 3 degrees.  She flexes to past 100 degrees.  At this point she will continue to increase her activities as comfort allows.  I gave her reassurance that the swelling that she is experiencing is normal and should lessen with time.  I do not need to see her back in 3 months unless there is any issues.  At that visit I would like an AP and lateral of her right knee.

## 2018-10-13 ENCOUNTER — Telehealth (INDEPENDENT_AMBULATORY_CARE_PROVIDER_SITE_OTHER): Payer: Self-pay | Admitting: Orthopaedic Surgery

## 2018-10-13 ENCOUNTER — Other Ambulatory Visit (INDEPENDENT_AMBULATORY_CARE_PROVIDER_SITE_OTHER): Payer: Self-pay

## 2018-10-13 MED ORDER — CLINDAMYCIN HCL 300 MG PO CAPS: ORAL_CAPSULE | ORAL | 0 refills | Status: DC

## 2018-10-13 NOTE — Telephone Encounter (Signed)
Sent into pharmacy, dentist office aware

## 2018-10-13 NOTE — Telephone Encounter (Signed)
Dr. Norberta Keens dentist called wanting to know if patient need RX.(routine cleaning) Pt had knee replacement in October 2019.  [phone]

## 2018-10-19 ENCOUNTER — Ambulatory Visit: Payer: Medicare Other | Admitting: Adult Health

## 2018-10-19 ENCOUNTER — Encounter: Payer: Self-pay | Admitting: Adult Health

## 2018-10-19 DIAGNOSIS — G2581 Restless legs syndrome: Secondary | ICD-10-CM | POA: Diagnosis not present

## 2018-10-19 DIAGNOSIS — G4733 Obstructive sleep apnea (adult) (pediatric): Secondary | ICD-10-CM

## 2018-10-19 NOTE — Progress Notes (Signed)
@Patient  ID: Jenna [name], female    DOB: 1941/02/28, 78 y.o.   MRN: [id number]  Chief Complaint  Patient presents with  . Follow-up    OSA    Referring provider: Chesley Noon, MD  HPI: 78 year old female followed for obstructive sleep apnea and restless leg syndrome  TEST/EVENTS :  NPSG 2005: AHI 17/hr, optimal pressure 11cm Auto 2014: Optimal pressure 13cm   10/19/2018 Follow up : OSA , RLS  Patient presents for a yearly visit for sleep apnea.  Patient is on CPAP at bedtime.  She is doing well on CPAP.  Feels rested with no significant daytime sleepiness.  Download shows excellent compliance with average usage around 9 hours.  Patient is on CPAP 13 cm H2O.  AHI is 2.3. Got new machine in 2019   Patient has restless leg syndrome takes Requip 1 at lunch and 2 at bedtime.  Says it is helping her leg symptoms .   She had Right Knee replacement in Oct 2019, did very well.    Allergies  Allergen Reactions  . Amoxicillin Nausea Only  . Crestor [Rosuvastatin] Other (See Comments)    Joints ached  . Lovastatin Other (See Comments)    Joints hurt all over body     Immunization History  Administered Date(s) Administered  . H1N1 09/12/2008  . Influenza Split 07/22/2011, 07/16/2012, 07/18/2017  . Influenza, High Dose Seasonal PF 07/29/2016, 07/19/2018  . Influenza,inj,Quad PF,6+ Mos 07/13/2013, 07/18/2014, 07/24/2015  . Zoster 07/21/2017    Past Medical History:  Diagnosis Date  . Anxiety   . CHF (congestive heart failure) ([place])   . Chronic lower back pain   . Chronic systolic dysfunction of left ventricle   . Depression   . Dysrhythmia    A-Fib  . Fatigue   . GERD (gastroesophageal reflux disease)   . High cholesterol   . Hypertension   . Hypothyroidism   . Melanoma of shoulder, left ([place])   . Mild aortic stenosis   . Nonischemic cardiomyopathy ([place])   . OSA on CPAP    reports compliance with CPAP  . Osteoarthritis   . Persistent atrial fibrillation with  RVR    [name] 11/15/2016  . PONV (postoperative nausea and vomiting)   . RLS (restless legs syndrome)   . S/P ablation of atrial flutter/ fib 01/10/17 01/11/2017  . Type II diabetes mellitus (HCC)    no medications    Tobacco History: Social History   Tobacco Use  Smoking Status Never Smoker  Smokeless Tobacco Never Used   Counseling given: Not Answered   Outpatient Medications Prior to Visit  Medication Sig Dispense Refill  . acetaminophen (TYLENOL) 500 MG tablet Take 500-1,000 mg by mouth every 6 (six) hours as needed for mild pain, moderate pain or headache.    . ALPRAZolam (XANAX) 0.5 MG tablet Take 0.5 mg by mouth 2 (two) times daily.     [name] atorvastatin (LIPITOR) 40 MG tablet take 20 mg three days a week, MON, WED and FRI (Patient taking differently: Take 20 mg by mouth every Monday, Wednesday, and Friday. ) 90 tablet 1  . buPROPion (WELLBUTRIN XL) 150 MG 24 hr tablet Take 300 mg by mouth daily.     . Calcium Carbonate-Vitamin D (CALCIUM + D PO) Take 1 tablet by mouth 2 (two) times daily.     . carvedilol (COREG) 6.25 MG tablet Take 1 tablet (6.25 mg total) by mouth 2 (two) times daily. 60 tablet 1  . ELIQUIS  5 MG TABS tablet TAKE 1 TABLET BY MOUTH TWICE A DAY (Patient taking differently: Take 5 mg by mouth 2 (two) times daily. ) 180 tablet 1  . furosemide (LASIX) 40 MG tablet Take 40 mg by mouth daily.     [name] KLOR-CON M20 20 MEQ tablet TAKE 2 TABLETS (40 MEQ TOTAL) BY MOUTH 2 (TWO) TIMES DAILY. 360 tablet 3  . levothyroxine (SYNTHROID, LEVOTHROID) 88 MCG tablet Take 88 mcg by mouth daily before breakfast.    . Magnesium 500 MG TABS Take 500 mg by mouth daily.     . Multiple Vitamins-Minerals (PRESERVISION AREDS 2) CAPS Take 1 capsule by mouth 2 (two) times daily.    [name] omeprazole (PRILOSEC) 40 MG capsule Take 40 mg by mouth daily.    Vladimir Faster Glycol-Propyl Glycol (SYSTANE) 0.4-0.3 % SOLN Place 2 drops into both eyes 4 (four) times daily.    [name] pyridoxine (B-6) 100 MG tablet Take  100 mg by mouth daily with supper.     [name] rOPINIRole (REQUIP) 0.5 MG tablet Take 2-3 daily at bedtime as needed (Patient taking differently: Take 0.5 mg by mouth 3 (three) times daily. ) 90 tablet 3  . vitamin B-12 (CYANOCOBALAMIN) 1000 MCG tablet Take 1,000 mcg by mouth daily.    . clindamycin (CLEOCIN) 300 MG capsule 2 by mouth one hour before dental cleaning and 2 by mouth six hours after dental appointment (Patient not taking: Reported on 10/19/2018) 8 capsule 0  . methocarbamol (ROBAXIN) 500 MG tablet Take 1 tablet (500 mg total) by mouth every 6 (six) hours as needed for muscle spasms. (Patient not taking: Reported on 10/19/2018) 30 tablet 0  . oxyCODONE (OXY IR/ROXICODONE) 5 MG immediate release tablet Take 1-2 tablets (5-10 mg total) by mouth every 3 (three) hours as needed for moderate pain (pain score 4-6). (Patient not taking: Reported on 10/19/2018) 30 tablet 0   No facility-administered medications prior to visit.      Review of Systems:   Constitutional:   No  weight loss, night sweats,  Fevers, chills +, fatigue, or  lassitude.  HEENT:   No headaches,  Difficulty swallowing,  Tooth/dental problems, or  Sore throat,                No sneezing, itching, ear ache, nasal congestion, post nasal drip,   CV:  No chest pain,  Orthopnea, PND, swelling in lower extremities, anasarca, dizziness, palpitations, syncope.   GI  No heartburn, indigestion, abdominal pain, nausea, vomiting, diarrhea, change in bowel habits, loss of appetite, bloody stools.   Resp: No shortness of breath with exertion or at rest.  No excess mucus, no productive cough,  No non-productive cough,  No coughing up of blood.  No change in color of mucus.  No wheezing.  No chest wall deformity  Skin: no rash or lesions.  GU: no dysuria, change in color of urine, no urgency or frequency.  No flank pain, no hematuria   MS:  + chronic back pain     Physical Exam  BP 124/62 (BP Location: Left Arm, Cuff Size: Normal)    Pulse 75   Wt 191 lb 3.2 oz (86.7 kg)   SpO2 100%   BMI 30.86 kg/m   GEN: A/Ox3; pleasant , NAD, obese    HEENT:  [place]/AT,  EACs-clear, TMs-wnl, NOSE-clear, THROAT-clear, no lesions, no postnasal drip or exudate noted. Class 2 MP airway   NECK:  Supple w/ fair ROM; no JVD; normal carotid impulses w/o  bruits; no thyromegaly or nodules palpated; no lymphadenopathy.    RESP  Clear  P & A; w/o, wheezes/ rales/ or rhonchi. no accessory muscle use, no dullness to percussion  CARD:  RRR, no m/r/g, tr  peripheral edema, pulses intact, no cyanosis or clubbing.  GI:   Soft & nt; nml bowel sounds; no organomegaly or masses detected.   Musco: Warm bil, no deformities or joint swelling noted.   Neuro: alert, no focal deficits noted.    Skin: Warm, no lesions or rashes    Lab Results:  CBC  BNP  ProBNP No results found for: PROBNP  Imaging: No results found.    No flowsheet data found.  No results found for: NITRICOXIDE      Assessment & Plan:   Obstructive sleep apnea Excellent control with CPAP   Plan  Patient Instructions  Continue on CPAP at bedtime Continue to work on healthy weight Do not drive if sleepy Continue on Requip Follow-up with Dr. Elsworth Soho in 1 year and as needed     RESTLESS LEGS SYNDROME Controlled on Requip .  No changes      Rexene Edison, NP 10/19/2018

## 2018-10-19 NOTE — Assessment & Plan Note (Signed)
Excellent control with CPAP   Plan  Patient Instructions  Continue on CPAP at bedtime Continue to work on healthy weight Do not drive if sleepy Continue on Requip Follow-up with Dr. Elsworth Soho in 1 year and as needed

## 2018-10-19 NOTE — Patient Instructions (Signed)
Continue on CPAP at bedtime ?Continue to work on healthy weight ?Do not drive if sleepy ?Continue on Requip ?Follow-up with Dr. Alva in 1 year and as needed ?

## 2018-10-19 NOTE — Assessment & Plan Note (Signed)
Controlled on Requip .  No changes

## 2018-10-21 ENCOUNTER — Ambulatory Visit: Payer: Medicare Other | Admitting: Internal Medicine

## 2018-10-21 ENCOUNTER — Encounter: Payer: Self-pay | Admitting: Internal Medicine

## 2018-10-21 VITALS — BP 124/80 | HR 115 | Ht 66.0 in | Wt 193.6 lb

## 2018-10-21 DIAGNOSIS — G4733 Obstructive sleep apnea (adult) (pediatric): Secondary | ICD-10-CM

## 2018-10-21 DIAGNOSIS — I428 Other cardiomyopathies: Secondary | ICD-10-CM

## 2018-10-21 DIAGNOSIS — I4819 Other persistent atrial fibrillation: Secondary | ICD-10-CM

## 2018-10-21 DIAGNOSIS — I119 Hypertensive heart disease without heart failure: Secondary | ICD-10-CM

## 2018-10-21 NOTE — Progress Notes (Signed)
PCP: Chesley Noon, MD Primary Cardiologist: Dr Debara Pickett Primary EP: Dr Liston Jenna is a 78 y.o. female who presents today for routine electrophysiology followup.  Since last being seen in our clinic, the patient reports doing very well.  She did have rare palpitations over the holidays but none since.  Doing well currently.  She had knee surgery in October.  She has not been very active.  SOB is stable.  Her primary limitation is with chronic back pain. Today, she denies symptoms of chest pain, lower extremity edema, dizziness, presyncope, or syncope.  The patient is otherwise without complaint today.   Past Medical History:  Diagnosis Date  . Anxiety   . CHF (congestive heart failure) ([place])   . Chronic lower back pain   . Chronic systolic dysfunction of left ventricle   . Depression   . Dysrhythmia    A-Fib  . Fatigue   . GERD (gastroesophageal reflux disease)   . High cholesterol   . Hypertension   . Hypothyroidism   . Melanoma of shoulder, left ([place])   . Mild aortic stenosis   . Nonischemic cardiomyopathy ([place])   . OSA on CPAP    reports compliance with CPAP  . Osteoarthritis   . Persistent atrial fibrillation with RVR    Archie [name] 11/15/2016  . PONV (postoperative nausea and vomiting)   . RLS (restless legs syndrome)   . S/P ablation of atrial flutter/ fib 01/10/17 01/11/2017  . Type II diabetes mellitus (HCC)    no medications   Past Surgical History:  Procedure Laterality Date  . ATRIAL FIBRILLATION ABLATION N/A 01/10/2017   Procedure: Atrial Fibrillation Ablation;  Surgeon: Thompson Grayer, MD;  Location: [place] CV LAB;  Service: Cardiovascular;  Laterality: N/A;  . CARDIOVERSION N/A 11/19/2016   Procedure: CARDIOVERSION;  Surgeon: Lelon Perla, MD;  [facility] ENDOSCOPY;  Service: Cardiovascular;  Laterality: N/A;  . CARDIOVERSION N/A 12/11/2016   Procedure: CARDIOVERSION;  Surgeon: Dorothy Spark, MD;  Location: [facility] ENDOSCOPY;  Service: Cardiovascular;   Laterality: N/A;  . CARDIOVERSION N/A 12/18/2016   Procedure: CARDIOVERSION;  Surgeon: Skeet Latch, MD;  Location: [place];  Service: Cardiovascular;  Laterality: N/A;  . CATARACT EXTRACTION W/ INTRAOCULAR LENS  IMPLANT, BILATERAL Bilateral   . DILATION AND CURETTAGE OF UTERUS    . LEXISCAN MYOVIEW  05/13/2012   No ECG changes. EKG negative for ischemia. No significant ischemia demonstrated.  [name] MELANOMA EXCISION Left ~ 09/1979   "back of my shoulder"  . TEE WITHOUT CARDIOVERSION N/A 11/19/2016   Procedure: TRANSESOPHAGEAL ECHOCARDIOGRAM (TEE);  Surgeon: Lelon Perla, MD;  Location: [facility] ENDOSCOPY;  Service: Cardiovascular;  Laterality: N/A;  need CV, but no anesthesia available  . TEE WITHOUT CARDIOVERSION N/A 01/10/2017   Procedure: TRANSESOPHAGEAL ECHOCARDIOGRAM (TEE);  Surgeon: [name], MD;  Location: [facility] ENDOSCOPY;  Service: Cardiovascular;  Laterality: N/A;  . TOTAL KNEE ARTHROPLASTY Right 07/28/2018   Procedure: RIGHT TOTAL KNEE ARTHROPLASTY;  Surgeon: Mcarthur Rossetti, MD;  Location: [place];  Service: Orthopedics;  Laterality: Right;  . TRANSTHORACIC ECHOCARDIOGRAM  02/18/2012   EF >55%, mild aortic stenosis  . VAGINAL HYSTERECTOMY  1985    ROS- all systems are reviewed and negatives except as per HPI above  Current Outpatient Medications  Medication Sig Dispense Refill  . acetaminophen (TYLENOL) 500 MG tablet Take 500-1,000 mg by mouth every 6 (six) hours as needed for mild pain, moderate pain or headache.    . ALPRAZolam (  XANAX) 0.5 MG tablet Take 0.5 mg by mouth 2 (two) times daily.     [name] atorvastatin (LIPITOR) 40 MG tablet take 20 mg three days a week, MON, WED and FRI (Patient taking differently: Take 20 mg by mouth every Monday, Wednesday, and Friday. ) 90 tablet 1  . buPROPion (WELLBUTRIN XL) 150 MG 24 hr tablet Take 300 mg by mouth daily.     . Calcium Carbonate-Vitamin D (CALCIUM + D PO) Take 1 tablet by mouth 2 (two) times daily.     . carvedilol  (COREG) 6.25 MG tablet Take 1 tablet (6.25 mg total) by mouth 2 (two) times daily. 60 tablet 1  . furosemide (LASIX) 40 MG tablet Take 40 mg by mouth daily.     [name] KLOR-CON M20 20 MEQ tablet TAKE 2 TABLETS (40 MEQ TOTAL) BY MOUTH 2 (TWO) TIMES DAILY. 360 tablet 3  . levothyroxine (SYNTHROID, LEVOTHROID) 88 MCG tablet Take 88 mcg by mouth daily before breakfast.    . Magnesium 500 MG TABS Take 500 mg by mouth daily.     . Multiple Vitamins-Minerals (PRESERVISION AREDS 2) CAPS Take 1 capsule by mouth 2 (two) times daily.    [name] omeprazole (PRILOSEC) 40 MG capsule Take 40 mg by mouth daily.    Vladimir Faster Glycol-Propyl Glycol (SYSTANE) 0.4-0.3 % SOLN Place 2 drops into both eyes 4 (four) times daily.    [name] pyridoxine (B-6) 100 MG tablet Take 100 mg by mouth daily with supper.     [name] rOPINIRole (REQUIP) 0.5 MG tablet Take 2-3 daily at bedtime as needed (Patient taking differently: Take 0.5 mg by mouth 3 (three) times daily. ) 90 tablet 3  . traMADol (ULTRAM) 50 MG tablet Take 1 tablet by mouth daily.  0  . vitamin B-12 (CYANOCOBALAMIN) 1000 MCG tablet Take 1,000 mcg by mouth daily.     No current facility-administered medications for this visit.     Physical Exam: Vitals:   10/21/18 1356  BP: 124/80  Pulse: (!) 115  SpO2: 97%  Weight: 193 lb 9.6 oz (87.8 kg)  Height: 5\' 6"  (1.676 m)    GEN- The patient is overweight appearing, alert and oriented x 3 today.   Head- normocephalic, atraumatic Eyes-  Sclera clear, conjunctiva pink Ears- hearing intact Oropharynx- clear Lungs- Clear to ausculation bilaterally, normal work of breathing Heart- Regular rate and rhythm, no murmurs, rubs or gallops, PMI not laterally displaced GI- soft, NT, ND, + BS Extremities- no clubbing, cyanosis, or edema  Wt Readings from Last 3 Encounters:  10/21/18 193 lb 9.6 oz (87.8 kg)  10/19/18 191 lb 3.2 oz (86.7 kg)  09/13/18 162 lb (73.5 kg)    EKG tracing ordered today is personally reviewed and shows sinus  tachycardia, though I cannot exclude atrial tachycardia  Assessment and Plan:  1. Persistent afib Doing well post ablation off AAD therapy chads2vasc score is 5.  Continue long term anticoagulation  2. Nonischemic CM Tachycardia mediated resolved  3. HTN Stable No change required today  4. OSA Compliance with CPAP encouraged  Follow-up with AF clinic every 6 months and with Dr Debara Pickett I will see when needed  Thompson Grayer MD, [facility] 10/21/2018 2:16 PM

## 2018-10-21 NOTE — Patient Instructions (Addendum)
Medication Instructions:  Your physician recommends that you continue on your current medications as directed. Please refer to the Current Medication list given to you today.  Labwork: None ordered.  Testing/Procedures: None ordered.  Follow-Up: Your physician wants you to follow-up in: 6 months with Roderic Palau NP at the afib clinic.      You will receive a reminder letter in the mail two months in advance. If you don't receive a letter, please call our office to schedule the follow-up appointment.  Any Other Special Instructions Will Be Listed Below (If Applicable).  If you need a refill on your cardiac medications before your next appointment, please call your pharmacy.

## 2018-11-03 DIAGNOSIS — R600 Localized edema: Secondary | ICD-10-CM | POA: Insufficient documentation

## 2018-11-03 DIAGNOSIS — R609 Edema, unspecified: Secondary | ICD-10-CM | POA: Insufficient documentation

## 2018-12-28 ENCOUNTER — Telehealth (INDEPENDENT_AMBULATORY_CARE_PROVIDER_SITE_OTHER): Payer: Self-pay | Admitting: Orthopaedic Surgery

## 2018-12-28 NOTE — Telephone Encounter (Signed)
Can we just tell her if she's doing ok, to just cancel and re-schedule 4-5 weeks out?

## 2018-12-28 NOTE — Telephone Encounter (Signed)
Patient son has been around someone and he came to her house last Saturday to mow her lawn. He does not have COVID but a coworker tested positive.  Please call patient to discuss upcoming appt on 36/31/20  [phone]

## 2018-12-28 NOTE — Telephone Encounter (Signed)
Moved pt apt to the 8th of April

## 2019-01-05 ENCOUNTER — Ambulatory Visit (INDEPENDENT_AMBULATORY_CARE_PROVIDER_SITE_OTHER): Payer: Medicare Other | Admitting: Orthopaedic Surgery

## 2019-01-12 ENCOUNTER — Telehealth (INDEPENDENT_AMBULATORY_CARE_PROVIDER_SITE_OTHER): Payer: Self-pay | Admitting: Radiology

## 2019-01-12 NOTE — Telephone Encounter (Signed)
Called and spoke with patient, patient answered NO to all pre screening questions.  

## 2019-01-13 ENCOUNTER — Ambulatory Visit (INDEPENDENT_AMBULATORY_CARE_PROVIDER_SITE_OTHER): Payer: Self-pay

## 2019-01-13 ENCOUNTER — Ambulatory Visit (INDEPENDENT_AMBULATORY_CARE_PROVIDER_SITE_OTHER): Payer: Medicare Other | Admitting: Orthopaedic Surgery

## 2019-01-13 ENCOUNTER — Encounter (INDEPENDENT_AMBULATORY_CARE_PROVIDER_SITE_OTHER): Payer: Self-pay | Admitting: Orthopaedic Surgery

## 2019-01-13 ENCOUNTER — Other Ambulatory Visit: Payer: Self-pay

## 2019-01-13 DIAGNOSIS — Z96651 Presence of right artificial knee joint: Secondary | ICD-10-CM | POA: Diagnosis not present

## 2019-01-13 NOTE — Progress Notes (Signed)
Office Visit Note   Patient: Jenna [name]           Date of Birth: January 08, 1941           MRN: [id number] Visit Date: 01/13/2019              Requested by: Chesley Noon, MD [place], [place] [address] PCP: Chesley Noon, MD   Assessment & Plan: Visit Diagnoses:  1. History of total right knee replacement   2. Status post total right knee replacement     Plan: She will continue to work on range of motion strengthening of the right knee.  She did ask about some low back pain that she is having after twisting her back about a week ago.  She is having no radicular symptoms.  Therefore recommended moist heat low back and also handouts with some back exercises were given.  She will follow-up with [place] in 6 months AP and lateral views of the right knee at that time.  For back pain continues or becomes worse she can follow-up sooner.  Questions encouraged and answered.  Follow-Up Instructions: Return in about 6 months (around 07/15/2019).   Orders:  Orders Placed This Encounter  Procedures  . XR Knee 1-2 Views Right   No orders of the defined types were placed in this encounter.     Procedures: No procedures performed   Clinical Data: No additional findings.   Subjective: Chief Complaint  Patient presents with  . Right Knee - Follow-up    HPI  Mrs. Struss is here today for follow-up status post right total knee arthroplasty 07/28/2018.  States that her right knee is doing well.  She has no complaints in regards to the knee.  States that her surgical incisions well-healed.  She has had no fevers or chills.  Review of Systems Please see HPI otherwise negative  Objective: Vital Signs: There were no vitals taken for this visit.  Physical Exam Constitutional:      Appearance: She is not ill-appearing or diaphoretic.  Pulmonary:     Effort: Pulmonary effort is normal.  Neurological:     Mental Status: She is alert and oriented to person, place, and  time.     Ortho Exam Right knee full extension flexion to 110 degrees.  No instability valgus varus stressing.  Right calf supple nontender.  Specialty Comments:  No specialty comments available.  Imaging: Xr Knee 1-2 Views Right  Result Date: 01/13/2019 AP and lateral views right knee: Right total knee components well-seated.  No periosteal reactions.  No bony abnormalities.    PMFS History: Patient Active Problem List   Diagnosis Date Noted  . Status post total knee replacement, right 07/28/2018  . Primary osteoarthritis of right knee 12/08/2017  . Nonischemic cardiomyopathy ([place]) 05/14/2017  . S/P ablation of atrial flutter/ fib 01/10/17 01/11/2017  . A-fib ([place]) 01/10/2017  . Weakness 12/17/2016  . Acute kidney injury superimposed on chronic kidney disease ([place]) 12/17/2016  . Hyperglycemia 12/17/2016  . Normocytic anemia 12/17/2016  . Acute kidney injury ([place]) 12/08/2016  . Acute on chronic systolic congestive heart failure ([place]) 12/08/2016  . Hyperkalemia 12/08/2016  . Cystitis   . Persistent atrial fibrillation 11/15/2016  . Depressed 07/04/2015  . Peripheral vertigo 12/16/2014  . Vertigo 12/16/2014  . Aortic valve stenosis 07/26/2013  . HTN (hypertension) 07/26/2013  . Hypothyroidism 07/26/2013  . PAF (paroxysmal atrial fibrillation) ([place]) 07/26/2013  . Anxiety 07/26/2013  . Aortic valve  disorder 07/26/2013  . GERD (gastroesophageal reflux disease) 07/26/2013  . OA (osteoarthritis) 07/26/2013  . Obstructive sleep apnea 06/20/2008  . RESTLESS LEGS SYNDROME 06/20/2008   Past Medical History:  Diagnosis Date  . Anxiety   . CHF (congestive heart failure) ([place])   . Chronic lower back pain   . Chronic systolic dysfunction of left ventricle   . Depression   . Dysrhythmia    A-Fib  . Fatigue   . GERD (gastroesophageal reflux disease)   . High cholesterol   . Hypertension   . Hypothyroidism   . Melanoma of shoulder, left ([place])   . Mild aortic stenosis   .  Nonischemic cardiomyopathy ([place])   . OSA on CPAP    reports compliance with CPAP  . Osteoarthritis   . Persistent atrial fibrillation with RVR    [name] 11/15/2016  . PONV (postoperative nausea and vomiting)   . RLS (restless legs syndrome)   . S/P ablation of atrial flutter/ fib 01/10/17 01/11/2017  . Type II diabetes mellitus (HCC)    no medications    Family History  Problem Relation Age of Onset  . Hypertension Mother   . Diabetes Mother   . Heart failure Father   . Arrhythmia Sister   . Hyperlipidemia Sister   . Hypertension Sister   . Healthy Brother   . Stroke Paternal Grandmother   . Cancer Paternal Grandfather   . Healthy Daughter   . Hypertension Son   . Hyperlipidemia Son     Past Surgical History:  Procedure Laterality Date  . ATRIAL FIBRILLATION ABLATION N/A 01/10/2017   Procedure: Atrial Fibrillation Ablation;  Surgeon: Thompson Grayer, MD;  Location: [place] CV LAB;  Service: Cardiovascular;  Laterality: N/A;  . CARDIOVERSION N/A 11/19/2016   Procedure: CARDIOVERSION;  Surgeon: Lelon Perla, MD;  Location: [facility] ENDOSCOPY;  Service: Cardiovascular;  Laterality: N/A;  . CARDIOVERSION N/A 12/11/2016   Procedure: CARDIOVERSION;  Surgeon: Dorothy Spark, MD;  Location: [facility] ENDOSCOPY;  Service: Cardiovascular;  Laterality: N/A;  . CARDIOVERSION N/A 12/18/2016   Procedure: CARDIOVERSION;  Surgeon: Skeet Latch, MD;  Location: [place];  Service: Cardiovascular;  Laterality: N/A;  . CATARACT EXTRACTION W/ INTRAOCULAR LENS  IMPLANT, BILATERAL Bilateral   . DILATION AND CURETTAGE OF UTERUS    . LEXISCAN MYOVIEW  05/13/2012   No ECG changes. EKG negative for ischemia. No significant ischemia demonstrated.  [name] MELANOMA EXCISION Left ~ 09/1979   "back of my shoulder"  . TEE WITHOUT CARDIOVERSION N/A 11/19/2016   Procedure: TRANSESOPHAGEAL ECHOCARDIOGRAM (TEE);  Surgeon: Lelon Perla, MD;  Location: [facility] ENDOSCOPY;  Service: Cardiovascular;  Laterality: N/A;  need CV, but  no anesthesia available  . TEE WITHOUT CARDIOVERSION N/A 01/10/2017   Procedure: TRANSESOPHAGEAL ECHOCARDIOGRAM (TEE);  Surgeon: [name], MD;  Location: [facility] ENDOSCOPY;  Service: Cardiovascular;  Laterality: N/A;  . TOTAL KNEE ARTHROPLASTY Right 07/28/2018   Procedure: RIGHT TOTAL KNEE ARTHROPLASTY;  Surgeon: Mcarthur Rossetti, MD;  Location: [place];  Service: Orthopedics;  Laterality: Right;  . TRANSTHORACIC ECHOCARDIOGRAM  02/18/2012   EF >55%, mild aortic stenosis  . VAGINAL HYSTERECTOMY  1985   Social History   Occupational History  . Not on file  Tobacco Use  . Smoking status: Never Smoker  . Smokeless tobacco: Never Used  Substance and Sexual Activity  . Alcohol use: No  . Drug use: Never  . Sexual activity: Never

## 2019-02-06 ENCOUNTER — Other Ambulatory Visit: Payer: Self-pay | Admitting: Internal Medicine

## 2019-03-03 ENCOUNTER — Other Ambulatory Visit: Payer: Self-pay | Admitting: Family Medicine

## 2019-03-03 DIAGNOSIS — E785 Hyperlipidemia, unspecified: Secondary | ICD-10-CM | POA: Insufficient documentation

## 2019-03-03 DIAGNOSIS — Z1231 Encounter for screening mammogram for malignant neoplasm of breast: Secondary | ICD-10-CM

## 2019-04-19 ENCOUNTER — Other Ambulatory Visit: Payer: Self-pay

## 2019-04-19 ENCOUNTER — Ambulatory Visit
Admission: RE | Admit: 2019-04-19 | Discharge: 2019-04-19 | Disposition: A | Discharge status: Home / Self Care | Payer: Medicare Other | Source: Ambulatory Visit | Attending: Family Medicine | Admitting: Family Medicine

## 2019-04-19 DIAGNOSIS — Z1231 Encounter for screening mammogram for malignant neoplasm of breast: Secondary | ICD-10-CM

## 2019-04-22 ENCOUNTER — Ambulatory Visit (HOSPITAL_COMMUNITY)
Admission: RE | Admit: 2019-04-22 | Discharge: 2019-04-22 | Disposition: A | Discharge status: Home / Self Care | Payer: Medicare Other | Source: Ambulatory Visit | Attending: Nurse Practitioner | Admitting: Nurse Practitioner

## 2019-04-22 ENCOUNTER — Encounter (HOSPITAL_COMMUNITY): Payer: Self-pay | Admitting: Nurse Practitioner

## 2019-04-22 ENCOUNTER — Other Ambulatory Visit: Payer: Self-pay

## 2019-04-22 VITALS — BP 130/82 | HR 109 | Ht 66.0 in | Wt 188.4 lb

## 2019-04-22 DIAGNOSIS — Z96651 Presence of right artificial knee joint: Secondary | ICD-10-CM | POA: Diagnosis not present

## 2019-04-22 DIAGNOSIS — I428 Other cardiomyopathies: Secondary | ICD-10-CM | POA: Insufficient documentation

## 2019-04-22 DIAGNOSIS — E039 Hypothyroidism, unspecified: Secondary | ICD-10-CM | POA: Diagnosis not present

## 2019-04-22 DIAGNOSIS — I110 Hypertensive heart disease with heart failure: Secondary | ICD-10-CM | POA: Insufficient documentation

## 2019-04-22 DIAGNOSIS — G2581 Restless legs syndrome: Secondary | ICD-10-CM | POA: Insufficient documentation

## 2019-04-22 DIAGNOSIS — I509 Heart failure, unspecified: Secondary | ICD-10-CM | POA: Diagnosis not present

## 2019-04-22 DIAGNOSIS — Z833 Family history of diabetes mellitus: Secondary | ICD-10-CM | POA: Insufficient documentation

## 2019-04-22 DIAGNOSIS — Z809 Family history of malignant neoplasm, unspecified: Secondary | ICD-10-CM | POA: Insufficient documentation

## 2019-04-22 DIAGNOSIS — Z881 Allergy status to other antibiotic agents status: Secondary | ICD-10-CM | POA: Diagnosis not present

## 2019-04-22 DIAGNOSIS — K219 Gastro-esophageal reflux disease without esophagitis: Secondary | ICD-10-CM | POA: Diagnosis not present

## 2019-04-22 DIAGNOSIS — M1990 Unspecified osteoarthritis, unspecified site: Secondary | ICD-10-CM | POA: Insufficient documentation

## 2019-04-22 DIAGNOSIS — E119 Type 2 diabetes mellitus without complications: Secondary | ICD-10-CM | POA: Diagnosis not present

## 2019-04-22 DIAGNOSIS — I4819 Other persistent atrial fibrillation: Secondary | ICD-10-CM | POA: Diagnosis not present

## 2019-04-22 DIAGNOSIS — F419 Anxiety disorder, unspecified: Secondary | ICD-10-CM | POA: Diagnosis not present

## 2019-04-22 DIAGNOSIS — Z79890 Hormone replacement therapy (postmenopausal): Secondary | ICD-10-CM | POA: Insufficient documentation

## 2019-04-22 DIAGNOSIS — Z7901 Long term (current) use of anticoagulants: Secondary | ICD-10-CM | POA: Insufficient documentation

## 2019-04-22 DIAGNOSIS — F329 Major depressive disorder, single episode, unspecified: Secondary | ICD-10-CM | POA: Diagnosis not present

## 2019-04-22 DIAGNOSIS — I471 Supraventricular tachycardia: Secondary | ICD-10-CM | POA: Diagnosis not present

## 2019-04-22 DIAGNOSIS — G8929 Other chronic pain: Secondary | ICD-10-CM | POA: Insufficient documentation

## 2019-04-22 DIAGNOSIS — Z8249 Family history of ischemic heart disease and other diseases of the circulatory system: Secondary | ICD-10-CM | POA: Diagnosis not present

## 2019-04-22 DIAGNOSIS — I350 Nonrheumatic aortic (valve) stenosis: Secondary | ICD-10-CM | POA: Insufficient documentation

## 2019-04-22 DIAGNOSIS — Z8349 Family history of other endocrine, nutritional and metabolic diseases: Secondary | ICD-10-CM | POA: Insufficient documentation

## 2019-04-22 DIAGNOSIS — Z79899 Other long term (current) drug therapy: Secondary | ICD-10-CM | POA: Diagnosis not present

## 2019-04-22 DIAGNOSIS — E7800 Pure hypercholesterolemia, unspecified: Secondary | ICD-10-CM | POA: Insufficient documentation

## 2019-04-22 DIAGNOSIS — Z888 Allergy status to other drugs, medicaments and biological substances status: Secondary | ICD-10-CM | POA: Diagnosis not present

## 2019-04-22 DIAGNOSIS — G4733 Obstructive sleep apnea (adult) (pediatric): Secondary | ICD-10-CM | POA: Diagnosis not present

## 2019-04-22 MED ORDER — CARVEDILOL 6.25 MG PO TABS: 9.5000 mg | ORAL_TABLET | Freq: Two times a day (BID) | ORAL | 1 refills | Status: DC

## 2019-04-22 NOTE — Progress Notes (Signed)
Primary Care Physician: Jenna Noon, MD Referring Physician:Dr. Allred Cardiologist:  Dr. Marga [name] Jenna [name] is a 78 y.o. female with a h/o afib ablation 01/10/2017 by Dr. Rayann [name], in the [facility] for f/u.  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 Does c/o of some chronic shortness of breath with exertion. She had spironolactone added by PCP recently for some extra swelling. Continues on apixaban without missed doses for chadsvasc score of at least 5. She has an echo in July 2019 that showed EF in SR  had normalized.  Today, she denies symptoms of palpitations, chest pain, shortness of breath, orthopnea, PND, lower extremity edema, dizziness, presyncope, syncope, or neurologic sequela. The patient is tolerating medications without difficulties and is otherwise without complaint today.   Past Medical History:  Diagnosis Date  . Anxiety   . CHF (congestive heart failure) ([place])   . Chronic lower back pain   . Chronic systolic dysfunction of left ventricle   . Depression   . Dysrhythmia    A-Fib  . Fatigue   . GERD (gastroesophageal reflux disease)   . High cholesterol   . Hypertension   . Hypothyroidism   . Melanoma of shoulder, left ([place])   . Mild aortic stenosis   . Nonischemic cardiomyopathy ([place])   . OSA on CPAP    reports compliance with CPAP  . Osteoarthritis   . Persistent atrial fibrillation with RVR    Jenna [name] 11/15/2016  . PONV (postoperative nausea and vomiting)   . RLS (restless legs syndrome)   . S/P ablation of atrial flutter/ fib 01/10/17 01/11/2017  . Type II diabetes mellitus (HCC)    no medications   Past Surgical History:  Procedure Laterality Date  . ATRIAL FIBRILLATION ABLATION N/A 01/10/2017   Procedure: Atrial Fibrillation Ablation;  Surgeon: Jenna Grayer, MD;  Location: [place] [facility];  Service: Cardiovascular;  Laterality: N/A;  . CARDIOVERSION  N/A 11/19/2016   Procedure: CARDIOVERSION;  Surgeon: Jenna Perla, MD;  Location: [facility] ENDOSCOPY;  Service: Cardiovascular;  Laterality: N/A;  . CARDIOVERSION N/A 12/11/2016   Procedure: CARDIOVERSION;  Surgeon: Jenna Spark, MD;  Location: [facility] ENDOSCOPY;  Service: Cardiovascular;  Laterality: N/A;  . CARDIOVERSION N/A 12/18/2016   Procedure: CARDIOVERSION;  Surgeon: Jenna Latch, MD;  Location: [place];  Service: Cardiovascular;  Laterality: N/A;  . CATARACT EXTRACTION W/ INTRAOCULAR LENS  IMPLANT, BILATERAL Bilateral   . DILATION AND CURETTAGE OF UTERUS    . LEXISCAN MYOVIEW  05/13/2012   No ECG changes. EKG negative for ischemia. No significant ischemia demonstrated.  [name] MELANOMA EXCISION Left ~ 09/1979   "back of my shoulder"  . TEE WITHOUT CARDIOVERSION N/A 11/19/2016   Procedure: TRANSESOPHAGEAL ECHOCARDIOGRAM (TEE);  Surgeon: Jenna Perla, MD;  Location: [facility] ENDOSCOPY;  Service: Cardiovascular;  Laterality: N/A;  need CV, but no anesthesia available  . TEE WITHOUT CARDIOVERSION N/A 01/10/2017   Procedure: TRANSESOPHAGEAL ECHOCARDIOGRAM (TEE);  Surgeon: Jenna Casino, MD;  Location: [facility] ENDOSCOPY;  Service: Cardiovascular;  Laterality: N/A;  . TOTAL KNEE ARTHROPLASTY Right 07/28/2018   Procedure: RIGHT TOTAL KNEE ARTHROPLASTY;  Surgeon: Jenna Rossetti, MD;  Location: [place];  Service: Orthopedics;  Laterality: Right;  . TRANSTHORACIC ECHOCARDIOGRAM  02/18/2012   EF >55%, mild aortic stenosis  . VAGINAL HYSTERECTOMY  1985    Current Outpatient Medications  Medication Sig Dispense Refill  . acetaminophen (  TYLENOL) 500 MG tablet Take 500-1,000 mg by mouth every 6 (six) hours as needed for mild pain, moderate pain or headache.    . ALPRAZolam (XANAX) 0.5 MG tablet Take 0.5 mg by mouth 2 (two) times daily.     [name] atorvastatin (LIPITOR) 20 MG tablet Take 20 mg by mouth 3 (three) times a week.    [name] buPROPion (WELLBUTRIN XL) 150 MG 24 hr tablet Take 300 mg by mouth daily.      . Calcium Carbonate-Vitamin D (CALCIUM + D PO) Take 1 tablet by mouth 2 (two) times daily.     . carvedilol (COREG) 6.25 MG tablet Take 1 tablet (6.25 mg total) by mouth 2 (two) times daily. 60 tablet 1  . ELIQUIS 5 MG TABS tablet TAKE 1 TABLET BY MOUTH TWICE A DAY 180 tablet 1  . furosemide (LASIX) 40 MG tablet Take 40 mg by mouth daily.     [name] KLOR-CON M20 20 MEQ tablet TAKE 2 TABLETS (40 MEQ TOTAL) BY MOUTH 2 (TWO) TIMES DAILY. 360 tablet 3  . levothyroxine (SYNTHROID, LEVOTHROID) 88 MCG tablet Take 88 mcg by mouth daily before breakfast.    . Magnesium 500 MG TABS Take 500 mg by mouth daily.     . Multiple Vitamins-Minerals (PRESERVISION AREDS 2) CAPS Take 1 capsule by mouth 2 (two) times daily.    [name] omeprazole (PRILOSEC) 40 MG capsule Take 40 mg by mouth daily.    Vladimir Faster Glycol-Propyl Glycol (SYSTANE) 0.4-0.3 % SOLN Place 2 drops into both eyes 4 (four) times daily.    [name] pyridoxine (B-6) 100 MG tablet Take 100 mg by mouth daily with supper.     [name] rOPINIRole (REQUIP) 0.5 MG tablet Take 0.5 mg by mouth 3 (three) times daily.    [name] spironolactone (ALDACTONE) 50 MG tablet Take 1 tablet by mouth daily.    . traMADol (ULTRAM) 50 MG tablet Take 1 tablet by mouth daily.  0  . vitamin B-12 (CYANOCOBALAMIN) 1000 MCG tablet Take 1,000 mcg by mouth daily.     No current facility-administered medications for this encounter.     Allergies  Allergen Reactions  . Amoxicillin Nausea Only  . Crestor [Rosuvastatin] Other (See Comments)    Joints ached  . Lovastatin Other (See Comments)    Joints hurt all over body     Social History   Socioeconomic History  . Marital status: Widowed    Spouse name: Not on file  . Number of children: Not on file  . Years of education: Not on file  . Highest education level: Not on file  Occupational History  . Not on file  Social Needs  . Financial resource strain: Not on file  . Food insecurity    Worry: Not on file    Inability: Not on file  .  Transportation needs    Medical: Not on file    Non-medical: Not on file  Tobacco Use  . Smoking status: Never Smoker  . Smokeless tobacco: Never Used  Substance and Sexual Activity  . Alcohol use: No  . Drug use: Never  . Sexual activity: Never  Lifestyle  . Physical activity    Days per week: Not on file    Minutes per session: Not on file  . Stress: Not on file  Relationships  . Social Herbalist on phone: Not on file    Gets together: Not on file    Attends religious service: Not on file  Active member of club or organization: Not on file    Attends meetings of clubs or organizations: Not on file    Relationship status: Not on file  . Intimate partner violence    Fear of current or ex partner: Not on file    Emotionally abused: Not on file    Physically abused: Not on file    Forced sexual activity: Not on file  Other Topics Concern  . Not on file  Social History Narrative  . Not on file    Family History  Problem Relation Age of Onset  . Hypertension Mother   . Diabetes Mother   . Heart failure Father   . Arrhythmia Sister   . Hyperlipidemia Sister   . Hypertension Sister   . Healthy Brother   . Stroke Paternal Grandmother   . Cancer Paternal Grandfather   . Healthy Daughter   . Hypertension Son   . Hyperlipidemia Son     ROS- All systems are reviewed and negative except as per the HPI above  Physical Exam: Vitals:   04/22/19 1401  BP: 130/82  Pulse: (!) 109  Weight: 85.5 kg  Height: 5\' 6"  (1.676 m)   Wt Readings from Last 3 Encounters:  04/22/19 85.5 kg  10/21/18 87.8 kg  10/19/18 86.7 kg    Labs: Lab Results  Component Value Date   NA 136 09/13/2018   K 4.0 09/13/2018   CL 105 09/13/2018   CO2 25 09/13/2018   GLUCOSE 92 09/13/2018   BUN 22 09/13/2018   CREATININE 0.84 09/13/2018   CALCIUM 8.8 (L) 09/13/2018   Lab Results  Component Value Date   INR 2.89 12/08/2016   Lab Results  Component Value Date   CHOL 170  02/11/2017   HDL 82 02/11/2017   LDLCALC 69 02/11/2017   TRIG 93 02/11/2017     GEN- The patient is well appearing, alert and oriented x 3 today.   Head- normocephalic, atraumatic Eyes-  Sclera clear, conjunctiva pink Ears- hearing intact Oropharynx- clear Neck- supple, no JVP Lymph- no cervical lymphadenopathy Lungs- Clear to ausculation bilaterally, normal work of breathing Heart- Regular rate and rhythm, no murmurs, rubs or gallops, PMI not laterally displaced GI- soft, NT, ND, + BS Extremities- no clubbing, cyanosis, or edema MS- no significant deformity or atrophy Skin- no rash or lesion Psych- euthymic mood, full affect Neuro- strength and sensation are intact  EKG- Sinus tachycardia at 109 bpm although can not rule out atrial tachycardia, EKG similar to Dr. Jackalyn Lombard last note where his interpretation was the same  Epic records reviewed Echo-Study Conclusions  - Left ventricle: The cavity size was normal. Systolic function was   normal. The estimated ejection fraction was in the range of 60%   to 65%. Wall motion was normal; there were no regional wall   motion abnormalities. Doppler parameters are consistent with   abnormal left ventricular relaxation (grade 1 diastolic   dysfunction). There was no evidence of elevated ventricular   filling pressure by Doppler parameters. - Aortic valve: Functionally bicuspid; severely thickened, severely   calcified leaflets. There was moderate stenosis. There was   trivial regurgitation. Mean gradient (S): 27 mm Hg. Peak gradient   (S): 39 mm Hg. - Right atrium: The atrium was normal in size. - Tricuspid valve: There was no significant regurgitation. - Pulmonary arteries: Systolic pressure could not be accurately   estimated. - Inferior vena cava: The vessel was normal in size. - Pericardium, extracardiac: There was  no pericardial effusion.   Assessment and Plan: 1. Persistent  afib s/p ablation and is doing well with no  known afib burden However, it seems as though her HR stays around 100 bpm I think if her HR can be slowed down she will feel better Increase carvedilol  To 6.125 mg 1 1/2 tab bid Continue eliquis bid  2. NICM EF recovered with return of SR  3. OSA Continue cpap  4. HTN Stable  F/u in afib clinic for one week nurse visit for EKG and BP check on higher dose of BB Dr. Debara Pickett per recall   Geroge Baseman. Chyla Schlender, [facility] [address] [place], [place] [address] [phone]

## 2019-04-22 NOTE — Patient Instructions (Signed)
Increase your Carvedilol to 1.5 tablets twice a day  Call if you are not tolerating this change well.  We will see you in one week

## 2019-04-29 ENCOUNTER — Ambulatory Visit (HOSPITAL_COMMUNITY)
Admission: RE | Admit: 2019-04-29 | Discharge: 2019-04-29 | Disposition: A | Discharge status: Home / Self Care | Payer: Medicare Other | Source: Ambulatory Visit | Attending: Nurse Practitioner | Admitting: Nurse Practitioner

## 2019-04-29 ENCOUNTER — Encounter (HOSPITAL_COMMUNITY): Payer: Self-pay | Admitting: Nurse Practitioner

## 2019-04-29 ENCOUNTER — Other Ambulatory Visit: Payer: Self-pay

## 2019-04-29 DIAGNOSIS — I4891 Unspecified atrial fibrillation: Secondary | ICD-10-CM | POA: Diagnosis not present

## 2019-04-29 DIAGNOSIS — Z833 Family history of diabetes mellitus: Secondary | ICD-10-CM | POA: Insufficient documentation

## 2019-04-29 DIAGNOSIS — Z7901 Long term (current) use of anticoagulants: Secondary | ICD-10-CM | POA: Diagnosis not present

## 2019-04-29 DIAGNOSIS — E039 Hypothyroidism, unspecified: Secondary | ICD-10-CM | POA: Diagnosis not present

## 2019-04-29 DIAGNOSIS — Z8249 Family history of ischemic heart disease and other diseases of the circulatory system: Secondary | ICD-10-CM | POA: Insufficient documentation

## 2019-04-29 DIAGNOSIS — F419 Anxiety disorder, unspecified: Secondary | ICD-10-CM | POA: Insufficient documentation

## 2019-04-29 DIAGNOSIS — E119 Type 2 diabetes mellitus without complications: Secondary | ICD-10-CM | POA: Diagnosis not present

## 2019-04-29 DIAGNOSIS — Z888 Allergy status to other drugs, medicaments and biological substances status: Secondary | ICD-10-CM | POA: Diagnosis not present

## 2019-04-29 DIAGNOSIS — Z809 Family history of malignant neoplasm, unspecified: Secondary | ICD-10-CM | POA: Diagnosis not present

## 2019-04-29 DIAGNOSIS — I110 Hypertensive heart disease with heart failure: Secondary | ICD-10-CM | POA: Insufficient documentation

## 2019-04-29 DIAGNOSIS — R9431 Abnormal electrocardiogram [ECG] [EKG]: Secondary | ICD-10-CM | POA: Diagnosis not present

## 2019-04-29 DIAGNOSIS — Z880 Allergy status to penicillin: Secondary | ICD-10-CM | POA: Insufficient documentation

## 2019-04-29 DIAGNOSIS — F329 Major depressive disorder, single episode, unspecified: Secondary | ICD-10-CM | POA: Insufficient documentation

## 2019-04-29 DIAGNOSIS — Z79890 Hormone replacement therapy (postmenopausal): Secondary | ICD-10-CM | POA: Insufficient documentation

## 2019-04-29 DIAGNOSIS — I509 Heart failure, unspecified: Secondary | ICD-10-CM | POA: Diagnosis not present

## 2019-04-29 DIAGNOSIS — Z79899 Other long term (current) drug therapy: Secondary | ICD-10-CM | POA: Diagnosis not present

## 2019-04-29 DIAGNOSIS — G8929 Other chronic pain: Secondary | ICD-10-CM | POA: Diagnosis not present

## 2019-04-29 DIAGNOSIS — G4733 Obstructive sleep apnea (adult) (pediatric): Secondary | ICD-10-CM | POA: Diagnosis not present

## 2019-04-29 DIAGNOSIS — R001 Bradycardia, unspecified: Secondary | ICD-10-CM | POA: Insufficient documentation

## 2019-04-29 NOTE — Progress Notes (Signed)
Patient ID: Jenna [name], female   DOB: 08/23/1941, 78 y.o.   MRN: [id number] Pt in for EKG with increase of carvedilol to 6.25 mg 1 1/2 tab bid. Her  HR is now in the low 80's instead of 104 bpm. Her BP is staying in the 888'B systolic. Tolerating increase of BB well .

## 2019-05-31 DIAGNOSIS — F5101 Primary insomnia: Secondary | ICD-10-CM | POA: Insufficient documentation

## 2019-07-15 ENCOUNTER — Encounter: Payer: Self-pay | Admitting: Orthopaedic Surgery

## 2019-07-15 ENCOUNTER — Ambulatory Visit (INDEPENDENT_AMBULATORY_CARE_PROVIDER_SITE_OTHER): Payer: Medicare Other | Admitting: Orthopaedic Surgery

## 2019-07-15 ENCOUNTER — Telehealth: Payer: Self-pay | Admitting: Internal Medicine

## 2019-07-15 ENCOUNTER — Ambulatory Visit: Payer: Self-pay

## 2019-07-15 ENCOUNTER — Other Ambulatory Visit: Payer: Self-pay

## 2019-07-15 DIAGNOSIS — Z96651 Presence of right artificial knee joint: Secondary | ICD-10-CM | POA: Diagnosis not present

## 2019-07-15 NOTE — Telephone Encounter (Signed)
LM for patient to call back to confirm 07/23/2019 virtual appointment OR patient can r/s for first available in-office visit

## 2019-07-15 NOTE — Progress Notes (Signed)
Office Visit Note   Patient: Jenna [name]           Date of Birth: 14-Jul-1941           MRN: [id number] Visit Date: 07/15/2019              Requested by: Chesley Noon, MD [place],  [place] [id number] PCP: Chesley Noon, MD   Assessment & Plan: Visit Diagnoses:  1. History of total right knee replacement     Plan: She will continue to work on strengthening of both knees.  She will follow-up with [place] on an as-needed basis if she has any questions concerns.  Questions were encouraged and answered at length today.  Follow-Up Instructions: Return if symptoms worsen or fail to improve.   Orders:  Orders Placed This Encounter  Procedures  . XR Knee 1-2 Views Right   No orders of the defined types were placed in this encounter.     Procedures: No procedures performed   Clinical Data: No additional findings.   Subjective: Chief Complaint  Patient presents with  . Right Knee - Follow-up    HPI Jenna [name] returns now 1 year status post right total hip arthroplasty.  She is doing well.  She has no complaints in regards to her right knee.  Occasionally has some discomfort in the left knee particularly going up and down stairs. Review of Systems Negative for fevers, chills, shortness of breath chest pain  Objective: Vital Signs: There were no vitals taken for this visit.  Physical Exam Constitutional:      Appearance: She is not ill-appearing or diaphoretic.  Pulmonary:     Effort: Pulmonary effort is normal.  Neurological:     Mental Status: She is alert and oriented to person, place, and time.  Psychiatric:        Mood and Affect: Mood normal.     Ortho Exam Right knee full extension flexion 210 to 115 degrees.  No instability valgus varus stressing.  Surgical incisions healed well no signs of infection.  She ambulates with a cane in right hand. Specialty Comments:  No specialty comments available.  Imaging: Xr Knee 1-2 Views Right  Result Date: 07/15/2019 AP and lateral views right knee: No acute fractures.  Components appear well seated.  Knee is well located.  No bony abnormalities.    PMFS History: Patient Active Problem List   Diagnosis Date Noted  . Status post total knee replacement, right 07/28/2018  . Primary osteoarthritis of right knee 12/08/2017  . Nonischemic cardiomyopathy ([place]) 05/14/2017  . S/P ablation of atrial flutter/ fib 01/10/17 01/11/2017  . A-fib ([place]) 01/10/2017  . Weakness 12/17/2016  . Acute kidney injury superimposed on chronic kidney disease ([place]) 12/17/2016  . Hyperglycemia 12/17/2016  . Normocytic anemia 12/17/2016  . Acute kidney injury ([place]) 12/08/2016  . Acute on chronic systolic congestive heart failure ([place]) 12/08/2016  . Hyperkalemia 12/08/2016  . Cystitis   . Persistent atrial fibrillation ([place]) 11/15/2016  . Depressed 07/04/2015  . Peripheral vertigo 12/16/2014  . Vertigo 12/16/2014  . Aortic valve stenosis 07/26/2013  . HTN (hypertension) 07/26/2013  . Hypothyroidism 07/26/2013  . PAF (paroxysmal atrial fibrillation) ([place]) 07/26/2013  . Anxiety 07/26/2013  . Aortic valve disorder 07/26/2013  . GERD (gastroesophageal reflux disease) 07/26/2013  . OA (osteoarthritis) 07/26/2013  . Obstructive sleep apnea 06/20/2008  . RESTLESS LEGS SYNDROME 06/20/2008   Past Medical History:  Diagnosis Date  . Anxiety   .  CHF (congestive heart failure) ([place])   . Chronic lower back pain   . Chronic systolic dysfunction of left ventricle   . Depression   . Dysrhythmia    A-Fib  . Fatigue   . GERD (gastroesophageal reflux disease)   . High cholesterol   . Hypertension   . Hypothyroidism   . Melanoma of shoulder, left ([place])   . Mild aortic stenosis   . Nonischemic cardiomyopathy ([place])   . OSA on CPAP    reports compliance with CPAP  . Osteoarthritis   . Persistent atrial fibrillation with RVR    [name] 11/15/2016  . PONV (postoperative nausea and vomiting)   . RLS (restless  legs syndrome)   . S/P ablation of atrial flutter/ fib 01/10/17 01/11/2017  . Type II diabetes mellitus (H[facility])    no medications    Family History  Problem Relation Age of Onset  . Hypertension Mother   . Diabetes Mother   . Heart failure Father   . Arrhythmia Sister   . Hyperlipidemia Sister   . Hypertension Sister   . Healthy Brother   . Stroke Paternal Grandmother   . Cancer Paternal Grandfather   . Healthy Daughter   . Hypertension Son   . Hyperlipidemia Son     Past Surgical History:  Procedure Laterality Date  . ATRIAL FIBRILLATION ABLATION N/A 01/10/2017   Procedure: Atrial Fibrillation Ablation;  Surgeon: Thompson Grayer, MD;  Location: [place] [facility];  Service: Cardiovascular;  Laterality: N/A;  . CARDIOVERSION N/A 11/19/2016   Procedure: CARDIOVERSION;  Surgeon: Lelon Perla, MD;  Location: [facility] ENDOSCOPY;  Service: Cardiovascular;  Laterality: N/A;  . CARDIOVERSION N/A 12/11/2016   Procedure: CARDIOVERSION;  Surgeon: Dorothy Spark, MD;  Location: [facility] ENDOSCOPY;  Service: Cardiovascular;  Laterality: N/A;  . CARDIOVERSION N/A 12/18/2016   Procedure: CARDIOVERSION;  Surgeon: Skeet Latch, MD;  Location: [place];  Service: Cardiovascular;  Laterality: N/A;  . CATARACT EXTRACTION W/ INTRAOCULAR LENS  IMPLANT, BILATERAL Bilateral   . DILATION AND CURETTAGE OF UTERUS    . LEXISCAN MYOVIEW  05/13/2012   No ECG changes. EKG negative for ischemia. No significant ischemia demonstrated.  [name] MELANOMA EXCISION Left ~ 09/1979   "back of my shoulder"  . TEE WITHOUT CARDIOVERSION N/A 11/19/2016   Procedure: TRANSESOPHAGEAL ECHOCARDIOGRAM (TEE);  Surgeon: Lelon Perla, MD;  Location: [facility] ENDOSCOPY;  Service: Cardiovascular;  Laterality: N/A;  need CV, but no anesthesia available  . TEE WITHOUT CARDIOVERSION N/A 01/10/2017   Procedure: TRANSESOPHAGEAL ECHOCARDIOGRAM (TEE);  Surgeon: [name], MD;  Location: [facility] ENDOSCOPY;  Service: Cardiovascular;  Laterality: N/A;  . TOTAL  KNEE ARTHROPLASTY Right 07/28/2018   Procedure: RIGHT TOTAL KNEE ARTHROPLASTY;  Surgeon: Mcarthur Rossetti, MD;  Location: [place];  Service: Orthopedics;  Laterality: Right;  . TRANSTHORACIC ECHOCARDIOGRAM  02/18/2012   EF >55%, mild aortic stenosis  . VAGINAL HYSTERECTOMY  1985   Social History   Occupational History  . Not on file  Tobacco Use  . Smoking status: Never Smoker  . Smokeless tobacco: Never Used  Substance and Sexual Activity  . Alcohol use: No  . Drug use: Never  . Sexual activity: Never

## 2019-07-23 ENCOUNTER — Telehealth (INDEPENDENT_AMBULATORY_CARE_PROVIDER_SITE_OTHER): Payer: Medicare Other | Admitting: Internal Medicine

## 2019-07-23 ENCOUNTER — Encounter: Payer: Self-pay | Admitting: Internal Medicine

## 2019-07-23 VITALS — BP 135/69 | HR 70 | Ht 66.0 in | Wt 188.4 lb

## 2019-07-23 DIAGNOSIS — I5023 Acute on chronic systolic (congestive) heart failure: Secondary | ICD-10-CM | POA: Diagnosis not present

## 2019-07-23 DIAGNOSIS — I350 Nonrheumatic aortic (valve) stenosis: Secondary | ICD-10-CM | POA: Diagnosis not present

## 2019-07-23 DIAGNOSIS — I428 Other cardiomyopathies: Secondary | ICD-10-CM

## 2019-07-23 DIAGNOSIS — Z8679 Personal history of other diseases of the circulatory system: Secondary | ICD-10-CM

## 2019-07-23 DIAGNOSIS — I471 Supraventricular tachycardia: Secondary | ICD-10-CM

## 2019-07-23 DIAGNOSIS — I480 Paroxysmal atrial fibrillation: Secondary | ICD-10-CM

## 2019-07-23 NOTE — Patient Instructions (Signed)
Medication Instructions:  Your physician recommends that you continue on your current medications as directed. Please refer to the Current Medication list given to you today.  *If you need a refill on your cardiac medications before your next appointment, please call your pharmacy*  Lab Work: None needed  Testing/Procedures: Echocardiogram @ [address] 3rd Floor  Follow-Up: At [facility], you and your health needs are our priority.  As part of our continuing mission to provide you with exceptional heart care, we have created designated Provider Care Teams.  These Care Teams include your primary Cardiologist (physician) and Advanced Practice Providers (APPs -  Physician Assistants and Nurse Practitioners) who all work together to provide you with the care you need, when you need it.  Your next appointment:   6 months  The format for your next appointment:   In Person  Provider:   You may see [name], MD or one of the following Advanced Practice Providers on your designated Care Team:    Almyra Deforest, PA-C  Fabian Sharp, PA-C or   Roby Lofts, [place]

## 2019-07-23 NOTE — Progress Notes (Signed)
Virtual Visit via Telephone Note   This visit type was conducted due to national recommendations for restrictions regarding the COVID-19 Pandemic (e.g. social distancing) in an effort to limit this patient's exposure and mitigate transmission in our community.  Due to her co-morbid illnesses, this patient is at least at moderate risk for complications without adequate follow up.  This format is felt to be most appropriate for this patient at this time.  The patient did not have access to video technology/had technical difficulties with video requiring transitioning to audio format only (telephone).  All issues noted in this document were discussed and addressed.  No physical exam could be performed with this format.  Please refer to the patient's chart for her  consent to telehealth for [facility].   Evaluation Performed:  Telephone follow-up  Date:  07/23/2019   ID:  Jenna, [name] 01/27/1941, MRN [id number]  Patient Location:  [address] [place] [address]  Provider location:   [address] [place] [address] [place], [place] [address]  PCP:  Chesley Noon, MD  Cardiologist:  [name], MD Electrophysiologist:  None   Chief Complaint:  Fatigue, occasional dyspnea  History of Present Illness:    Jenna [name] is a 78 y.o. female who presents via audio/video conferencing for a telehealth visit today.  Jenna [name] is seen today for telephone follow-up.  She has a history of atrial fibrillation status post ablation, however recently had recurrent tachycardia and was felt to possibly have an atrial tachycardia.  She was seen by Roderic Palau, NP in the A. fib clinic who increased her carvedilol further.  Subsequent follow-up showed improvement in her atrial rates down into the 80s and her heart rate is in the 70s today.  Blood pressure is well controlled.  She has had some worsening shortness of breath and fatigue recently.  Her last echo in 2018 showed  normalization of LV function, however she had a heavily calcified and functionally bicuspid aortic valve with at least moderate stenosis and a mean gradient of 7 mmHg with a peak gradient of 39 mmHg.  This is overdue for repeat echo assessment, and I am concerned that she may have developed interval severe aortic stenosis.  She denies chest pain but again has had some fatigue and shortness of breath with exertion.  Specifically, when she was in the atrial tachycardia or A. fib she felt quite a bit worse, which is likely due to lack of atrial kick in the setting of at least moderate aortic stenosis.  The patient does not have symptoms concerning for COVID-19 infection (fever, chills, cough, or new SHORTNESS OF BREATH).    Prior CV studies:   The following studies were reviewed today:  Chart reviewed  PMHx:  Past Medical History:  Diagnosis Date  . Anxiety   . CHF (congestive heart failure) ([place])   . Chronic lower back pain   . Chronic systolic dysfunction of left ventricle   . Depression   . Dysrhythmia    A-Fib  . Fatigue   . GERD (gastroesophageal reflux disease)   . High cholesterol   . Hypertension   . Hypothyroidism   . Melanoma of shoulder, left ([place])   . Mild aortic stenosis   . Nonischemic cardiomyopathy ([place])   . OSA on CPAP    reports compliance with CPAP  . Osteoarthritis   . Persistent atrial fibrillation with RVR ([place])    [name] 11/15/2016  . PONV (postoperative  nausea and vomiting)   . RLS (restless legs syndrome)   . S/P ablation of atrial flutter/ fib 01/10/17 01/11/2017  . Type II diabetes mellitus (H[facility])    no medications    Past Surgical History:  Procedure Laterality Date  . ATRIAL FIBRILLATION ABLATION N/A 01/10/2017   Procedure: Atrial Fibrillation Ablation;  Surgeon: Thompson Grayer, MD;  Location: [facility];  Service: Cardiovascular;  Laterality: N/A;  . CARDIOVERSION N/A 11/19/2016   Procedure: CARDIOVERSION;  Surgeon: Lelon Perla, MD;  Location[facility]  [facility] ENDOSCOPY;  Service: Cardiovascular;  Laterality: N/A;  . CARDIOVERSION N/A 12/11/2016   Procedure: CARDIOVERSION;  Surgeon: Dorothy Spark, MD;  Location: [facility] ENDOSCOPY;  Service: Cardiovascular;  Laterality: N/A;  . CARDIOVERSION N/A 12/18/2016   Procedure: CARDIOVERSION;  Surgeon: Skeet Latch, MD;  Location: [facility];  Service: Cardiovascular;  Laterality: N/A;  . CATARACT EXTRACTION W/ INTRAOCULAR LENS  IMPLANT, BILATERAL Bilateral   . DILATION AND CURETTAGE OF UTERUS    . LEXISCAN MYOVIEW  05/13/2012   No ECG changes. EKG negative for ischemia. No significant ischemia demonstrated.  [name] MELANOMA EXCISION Left ~ 09/1979   "back of my shoulder"  . TEE WITHOUT CARDIOVERSION N/A 11/19/2016   Procedure: TRANSESOPHAGEAL ECHOCARDIOGRAM (TEE);  Surgeon: Lelon Perla, MD;  Location: [facility] ENDOSCOPY;  Service: Cardiovascular;  Laterality: N/A;  need CV, but no anesthesia available  . TEE WITHOUT CARDIOVERSION N/A 01/10/2017   Procedure: TRANSESOPHAGEAL ECHOCARDIOGRAM (TEE);  Surgeon: [name], MD;  Location: [facility] ENDOSCOPY;  Service: Cardiovascular;  Laterality: N/A;  . TOTAL KNEE ARTHROPLASTY Right 07/28/2018   Procedure: RIGHT TOTAL KNEE ARTHROPLASTY;  Surgeon: Mcarthur Rossetti, MD;  Location: [facility];  Service: Orthopedics;  Laterality: Right;  . TRANSTHORACIC ECHOCARDIOGRAM  02/18/2012   EF >55%, mild aortic stenosis  . VAGINAL HYSTERECTOMY  1985    FAMHx:  Family History  Problem Relation Age of Onset  . Hypertension Mother   . Diabetes Mother   . Heart failure Father   . Arrhythmia Sister   . Hyperlipidemia Sister   . Hypertension Sister   . Healthy Brother   . Stroke Paternal Grandmother   . Cancer Paternal Grandfather   . Healthy Daughter   . Hypertension Son   . Hyperlipidemia Son     SOCHx:   reports that she has never smoked. She has never used smokeless tobacco. She reports that she does not drink alcohol or use drugs.  ALLERGIES:  Allergies  Allergen  Reactions  . Amoxicillin Nausea Only  . Crestor [Rosuvastatin] Other (See Comments)    Joints ached  . Lovastatin Other (See Comments)    Joints hurt all over body     MEDS:  Current Meds  Medication Sig  . acetaminophen (TYLENOL) 500 MG tablet Take 500-1,000 mg by mouth every 6 (six) hours as needed for mild pain, moderate pain or headache.  . ALPRAZolam (XANAX) 0.5 MG tablet Take 0.5 mg by mouth 2 (two) times daily.   [name] atorvastatin (LIPITOR) 20 MG tablet Take 20 mg by mouth 3 (three) times a week.  [name] buPROPion (WELLBUTRIN XL) 150 MG 24 hr tablet Take 300 mg by mouth daily.   . Calcium Carbonate-Vitamin D (CALCIUM + D PO) Take 1 tablet by mouth 2 (two) times daily.   . carvedilol (COREG) 6.25 MG tablet Take 1.5 tablets (9.375 mg total) by mouth 2 (two) times daily.  [name] ELIQUIS 5 MG TABS tablet TAKE 1 TABLET BY MOUTH TWICE A DAY  .  furosemide (LASIX) 40 MG tablet Take 40 mg by mouth daily.   [name] KLOR-CON M20 20 MEQ tablet TAKE 2 TABLETS (40 MEQ TOTAL) BY MOUTH 2 (TWO) TIMES DAILY.  [name] levothyroxine (SYNTHROID, LEVOTHROID) 88 MCG tablet Take 88 mcg by mouth daily before breakfast.  . Magnesium 500 MG TABS Take 500 mg by mouth daily.   . Multiple Vitamins-Minerals (PRESERVISION AREDS 2) CAPS Take 1 capsule by mouth 2 (two) times daily.  [name] omeprazole (PRILOSEC) 40 MG capsule Take 40 mg by mouth daily.  Vladimir Faster Glycol-Propyl Glycol (SYSTANE) 0.4-0.3 % SOLN Place 2 drops into both eyes 4 (four) times daily.  [name] pyridoxine (B-6) 100 MG tablet Take 100 mg by mouth daily with supper.   [name] rOPINIRole (REQUIP) 0.5 MG tablet Take 0.5 mg by mouth 3 (three) times daily.  [name] spironolactone (ALDACTONE) 50 MG tablet Take 1 tablet by mouth daily.  . vitamin B-12 (CYANOCOBALAMIN) 1000 MCG tablet Take 1,000 mcg by mouth daily.  . [DISCONTINUED] traMADol (ULTRAM) 50 MG tablet Take 1 tablet by mouth daily.     ROS: Pertinent items noted in HPI and remainder of comprehensive ROS otherwise negative.   Labs/Other Tests and Data Reviewed:    Recent Labs: 09/13/2018: BUN 22; Creatinine, Ser 0.84; Hemoglobin 11.3; Platelets 368; Potassium 4.0; Sodium 136   Recent Lipid Panel Lab Results  Component Value Date/Time   CHOL 170 02/11/2017 09:41 AM   TRIG 93 02/11/2017 09:41 AM   HDL 82 02/11/2017 09:41 AM   CHOLHDL 2.1 02/11/2017 09:41 AM   LDLCALC 69 02/11/2017 09:41 AM    Wt Readings from Last 3 Encounters:  07/23/19 188 lb 6.4 oz (85.5 kg)  04/29/19 186 lb (84.4 kg)  04/22/19 188 lb 6.4 oz (85.5 kg)     Exam:    Vital Signs:  BP 135/69   Pulse 70   Ht 5\' 6"  (1.676 m)   Wt 188 lb 6.4 oz (85.5 kg)   BMI 30.41 kg/m    Exam not performed due to telephone visit  ASSESSMENT & PLAN:    1. Paroxysmal atrial fibrillation, s/p catheter ablation (01/2017) - [id number] on Eliquis 2. 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 HTN - controlled 4. Dyslipidemia - not on a statin, intolerant to Crestor and lovastatin 5. Moderate aortic stenosis (2018) 6. History of depression  Jenna [name] has not had recurrent A. fib rather probably a supraventricular tachycardia and a history of tachycardia mediated cardiomyopathy.  Her LVEF had improved to normal in 2018 however it was noted that she now had moderate aortic stenosis with a calcified and functionally bicuspid aortic valve.  She is well overdue for repeat echo which we will order today.  Since she has had fatigue and shortness of breath recently, she may now have severe aortic stenosis.  If this is the case then I would refer her to the multidisciplinary valve clinic for evaluation of possible TAVR.  COVID-19 Education: The signs and symptoms of COVID-19 were discussed with the patient and how to seek care for testing (follow up with PCP or arrange E-visit).  The importance of social distancing was discussed today.  Patient Risk:   After full review of this patients clinical status, I feel that they are at  least moderate risk at this time.  Time:   Today, I have spent 25 minutes with the patient with telehealth technology discussing aortic stenosis, A. fib, atrial tachycardia, hypertension, cardiomyopathy.     Medication Adjustments/Labs and Tests  Ordered: Current medicines are reviewed at length with the patient today.  Concerns regarding medicines are outlined above.   Tests Ordered: Orders Placed This Encounter  Procedures  . ECHOCARDIOGRAM COMPLETE    Medication Changes: No orders of the defined types were placed in this encounter.   Disposition:  in 6 month(s)  [name], MD, [facility], [place] Director of the Advanced Lipid Disorders &  Cardiovascular Risk Reduction Clinic Diplomate of the American Board of Clinical Lipidology Attending Cardiologist  Direct Dial: [phone]  Fax: [phone]  Website:  [email or web address]  [name], MD  07/23/2019 10:06 AM

## 2019-08-03 ENCOUNTER — Other Ambulatory Visit: Payer: Self-pay | Admitting: Internal Medicine

## 2019-08-06 ENCOUNTER — Other Ambulatory Visit (HOSPITAL_COMMUNITY): Payer: Medicare Other

## 2019-08-09 ENCOUNTER — Other Ambulatory Visit: Payer: Self-pay

## 2019-08-09 ENCOUNTER — Ambulatory Visit (HOSPITAL_COMMUNITY): Payer: Medicare Other | Attending: Internal Medicine

## 2019-08-09 DIAGNOSIS — I350 Nonrheumatic aortic (valve) stenosis: Secondary | ICD-10-CM | POA: Diagnosis not present

## 2019-08-09 MED ORDER — PERFLUTREN LIPID MICROSPHERE
1.0000 mL | INTRAVENOUS | Status: AC | PRN
  Administered 2019-08-09: 2 mL via INTRAVENOUS

## 2019-08-13 ENCOUNTER — Telehealth: Payer: Self-pay | Admitting: Internal Medicine

## 2019-08-13 NOTE — Telephone Encounter (Signed)
Ok to follow-up in 6 months unless symptoms worsen before then.  Dr. Lemmie Evens

## 2019-08-13 NOTE — Telephone Encounter (Signed)
Routing to primary nurse

## 2019-08-13 NOTE — Telephone Encounter (Signed)
Notes recorded by [name], MD on 08/09/2019 at 4:26 PM EST  Normal LVEF, moderate to severe AS   DR. H   Patient aware of echo results. AS has worsened some per echo report from 2018 to 2020. She reports SOB with some mild exertion. She reports progressive fatigue. HR has been elevated over 100 bpm this week.

## 2019-08-13 NOTE — Telephone Encounter (Signed)
Patient calling in regards to echo results.

## 2019-08-16 NOTE — Telephone Encounter (Signed)
Patient called with MD advice. She reports continued SOB, reports having to stop while walking to get her mail. She reports HR persistently in the 100s as well.   Offered her an in-office eval, as her last visit was a telemedicine appt - she will come in to see Denyse Amass NP on 11/12 @ 1015am

## 2019-08-18 NOTE — Progress Notes (Signed)
Cardiology Clinic Note   Patient Name: Jenna [name] Date of Encounter: 08/19/2019  Primary Care Provider:  Chesley Noon, MD Primary Cardiologist:  Jenna Casino, MD  Patient Profile    Jenna [name] 78 year old female presents today for follow-up of her hypertension, paroxysmal atrial fibrillation, aortic valve stenosis, and increased shortness of breath.  Past Medical History    Past Medical History:  Diagnosis Date  . Anxiety   . CHF (congestive heart failure) ([place])   . Chronic lower back pain   . Chronic systolic dysfunction of left ventricle   . Depression   . Dysrhythmia    A-Fib  . Fatigue   . GERD (gastroesophageal reflux disease)   . High cholesterol   . Hypertension   . Hypothyroidism   . Melanoma of shoulder, left ([place])   . Mild aortic stenosis   . Nonischemic cardiomyopathy ([place])   . OSA on CPAP    reports compliance with CPAP  . Osteoarthritis   . Persistent atrial fibrillation with RVR ([place])    Jenna [name] 11/15/2016  . PONV (postoperative nausea and vomiting)   . RLS (restless legs syndrome)   . S/P ablation of atrial flutter/ fib 01/10/17 01/11/2017  . Type II diabetes mellitus (HCC)    no medications   Past Surgical History:  Procedure Laterality Date  . ATRIAL FIBRILLATION ABLATION N/A 01/10/2017   Procedure: Atrial Fibrillation Ablation;  Surgeon: Thompson Grayer, MD;  Location: [facility];  Service: Cardiovascular;  Laterality: N/A;  . CARDIOVERSION N/A 11/19/2016   Procedure: CARDIOVERSION;  Surgeon: Lelon Perla, MD;  Location: [facility] ENDOSCOPY;  Service: Cardiovascular;  Laterality: N/A;  . CARDIOVERSION N/A 12/11/2016   Procedure: CARDIOVERSION;  Surgeon: Dorothy Spark, MD;  Location: [facility] ENDOSCOPY;  Service: Cardiovascular;  Laterality: N/A;  . CARDIOVERSION N/A 12/18/2016   Procedure: CARDIOVERSION;  Surgeon: Skeet Latch, MD;  Location: [facility];  Service: Cardiovascular;  Laterality: N/A;  . CATARACT EXTRACTION W/ INTRAOCULAR  LENS  IMPLANT, BILATERAL Bilateral   . DILATION AND CURETTAGE OF UTERUS    . LEXISCAN MYOVIEW  05/13/2012   No ECG changes. EKG negative for ischemia. No significant ischemia demonstrated.  [name] MELANOMA EXCISION Left ~ 09/1979   "back of my shoulder"  . TEE WITHOUT CARDIOVERSION N/A 11/19/2016   Procedure: TRANSESOPHAGEAL ECHOCARDIOGRAM (TEE);  Surgeon: Lelon Perla, MD;  Location: [facility] ENDOSCOPY;  Service: Cardiovascular;  Laterality: N/A;  need CV, but no anesthesia available  . TEE WITHOUT CARDIOVERSION N/A 01/10/2017   Procedure: TRANSESOPHAGEAL ECHOCARDIOGRAM (TEE);  Surgeon: Jenna Casino, MD;  Location: [facility] ENDOSCOPY;  Service: Cardiovascular;  Laterality: N/A;  . TOTAL KNEE ARTHROPLASTY Right 07/28/2018   Procedure: RIGHT TOTAL KNEE ARTHROPLASTY;  Surgeon: Mcarthur Rossetti, MD;  Location: [place];  Service: Orthopedics;  Laterality: Right;  . TRANSTHORACIC ECHOCARDIOGRAM  02/18/2012   EF >55%, mild aortic stenosis  . VAGINAL HYSTERECTOMY  1985    Allergies  Allergies  Allergen Reactions  . Amoxicillin Nausea Only  . Crestor [Rosuvastatin] Other (See Comments)    Joints ached  . Lovastatin Other (See Comments)    Joints hurt all over body     History of Present Illness    Jenna [name] has a past medical history of atrial fibrillation status post ablation, aortic stenosis, fatigue, shortness of breath, obstructive sleep apnea, hypothyroidism, GERD, acute kidney injury, hyperkalemia, weakness, anxiety, and essential hypertension.  She was last seen by Dr. Debara [name] via virtual platform on 07/23/2019.  During that time she stated she had increased shortness of breath and a echocardiogram was ordered (08/09/2019 ).  Her echocardiogram showed an LVEF of 60 to 65% with moderately increased LVH, grade 1 diastolic dysfunction, and moderate to severe aortic stenosis with peak gradient of 44.6 mmHg.   She denied chest pain but indicated that she had some fatigue and shortness of breath with  exertion.  She also sees Jenna [name] in the A. fib clinic who had increased her carvedilol.  It is believed that her atrial tachycardia or atrial fibrillation makes her shortness of breath worse due to a lack of atrial kick in the setting of her moderate to severe aortic stenosis.  She presents to the clinic today and states she has noticed increased shortness of breath over the last 5 to 6 months.  She states that her dyspnea with exertion has been consistent since the first week of June.  She does however state that after her carvedilol was increased she does feel somewhat better.  She states that when she accumulates fluid she mainly notices the increase in her abdominal region.  She states her pants are fitting much tighter today.  She has been fairly sedentary at home but states she does walk in her house daily.  She has been compliant with a low-sodium diet and limits her fluid to 2 L/day.  She has gained around 4.5 pounds since 07/23/2019.  I will increase her Lasix to 80 mg daily for 3 days and increase her potassium to 60 mEq for 3 days.  Have her return for a follow-up visit in 1 week and recheck her BMP.  She denies chest pain, increased lower extremity edema, fatigue, melena, hematuria, hemoptysis, diaphoresis, weakness, presyncope, syncope, orthopnea, and PND.   Home Medications    Prior to Admission medications   Medication Sig Start Date End Date Taking? Authorizing Provider  acetaminophen (TYLENOL) 500 MG tablet Take 500-1,000 mg by mouth every 6 (six) hours as needed for mild pain, moderate pain or headache.    [provider]  ALPRAZolam Duanne Moron) 0.5 MG tablet Take 0.5 mg by mouth 2 (two) times daily.  07/05/16   [provider]  atorvastatin (LIPITOR) 20 MG tablet Take 20 mg by mouth 3 (three) times a week.    [provider]  buPROPion (WELLBUTRIN XL) 150 MG 24 hr tablet Take 300 mg by mouth daily.  08/07/15   [provider]  Calcium  Carbonate-Vitamin D (CALCIUM + D PO) Take 1 tablet by mouth 2 (two) times daily.     [provider]  carvedilol (COREG) 6.25 MG tablet Take 1.5 tablets (9.375 mg total) by mouth 2 (two) times daily. 04/22/19   Sherran Needs, [name]  ELIQUIS 5 MG TABS tablet TAKE 1 TABLET BY MOUTH TWICE A DAY 08/03/19   Hilty, Nadean Corwin, MD  furosemide (LASIX) 40 MG tablet Take 40 mg by mouth daily.  09/24/17   [provider]  KLOR-CON M20 20 MEQ tablet TAKE 2 TABLETS (40 MEQ TOTAL) BY MOUTH 2 (TWO) TIMES DAILY. 08/27/18   Hilty, Nadean Corwin, MD  levothyroxine (SYNTHROID, LEVOTHROID) 88 MCG tablet Take 88 mcg by mouth daily before breakfast.    [provider]  Magnesium 500 MG TABS Take 500 mg by mouth daily.     [provider]  Multiple Vitamins-Minerals (PRESERVISION AREDS 2) CAPS Take 1 capsule by mouth 2 (two) times daily.    [provider]  omeprazole (PRILOSEC) 40  MG capsule Take 40 mg by mouth daily.    [provider]  Polyethyl Glycol-Propyl Glycol (SYSTANE) 0.4-0.3 % SOLN Place 2 drops into both eyes 4 (four) times daily.    [provider]  pyridoxine (B-6) 100 MG tablet Take 100 mg by mouth daily with supper.     [provider]  rOPINIRole (REQUIP) 0.5 MG tablet Take 0.5 mg by mouth 3 (three) times daily.    [provider]  spironolactone (ALDACTONE) 50 MG tablet Take 1 tablet by mouth daily. 03/23/19   [provider]  vitamin B-12 (CYANOCOBALAMIN) 1000 MCG tablet Take 1,000 mcg by mouth daily.    [provider]    Family History    Family History  Problem Relation Age of Onset  . Hypertension Mother   . Diabetes Mother   . Heart failure Father   . Arrhythmia Sister   . Hyperlipidemia Sister   . Hypertension Sister   . Healthy Brother   . Stroke Paternal Grandmother   . Cancer Paternal Grandfather   . Healthy Daughter   . Hypertension Son   . Hyperlipidemia Son    She indicated that her  mother is deceased. She indicated that her father is deceased. She indicated that her sister is alive. She indicated that her brother is alive. She indicated that her maternal grandmother is deceased. She indicated that her maternal grandfather is deceased. She indicated that her paternal grandmother is deceased. She indicated that her paternal grandfather is deceased. She indicated that the status of her daughter is unknown. She indicated that the status of her son is unknown.  Social History    Social History   Socioeconomic History  . Marital status: Widowed    Spouse name: Not on file  . Number of children: Not on file  . Years of education: Not on file  . Highest education level: Not on file  Occupational History  . Not on file  Social Needs  . Financial resource strain: Not on file  . Food insecurity    Worry: Not on file    Inability: Not on file  . Transportation needs    Medical: Not on file    Non-medical: Not on file  Tobacco Use  . Smoking status: Never Smoker  . Smokeless tobacco: Never Used  Substance and Sexual Activity  . Alcohol use: No  . Drug use: Never  . Sexual activity: Never  Lifestyle  . Physical activity    Days per week: Not on file    Minutes per session: Not on file  . Stress: Not on file  Relationships  . Social Herbalist on phone: Not on file    Gets together: Not on file    Attends religious service: Not on file    Active member of club or organization: Not on file    Attends meetings of clubs or organizations: Not on file    Relationship status: Not on file  . Intimate partner violence    Fear of current or ex partner: Not on file    Emotionally abused: Not on file    Physically abused: Not on file    Forced sexual activity: Not on file  Other Topics Concern  . Not on file  Social History Narrative  . Not on file     Review of Systems    General:  No chills, fever, night sweats or weight changes.  Cardiovascular:  No  chest pain, dyspnea  on exertion, edema, orthopnea, palpitations, paroxysmal nocturnal dyspnea. Dermatological: No rash, lesions/masses Respiratory: No cough, dyspnea Urologic: No hematuria, dysuria Abdominal:   No nausea, vomiting, diarrhea, bright red blood per rectum, melena, or hematemesis Neurologic:  No visual changes, wkns, changes in mental status. All other systems reviewed and are otherwise negative except as noted above.  Physical Exam    VS:  BP 133/83   Pulse 73   Ht 5\' 6"  (1.676 m)   Wt 192 lb (87.1 kg)   BMI 30.99 kg/m  , BMI Body mass index is 30.99 kg/m. GEN: Well nourished, well developed, in no acute distress. HEENT: normal. Neck: Supple, no JVD, carotid bruits, or masses. Cardiac: RRR,+ harsh crescendo-decrescendo 4/ 6 systolic murmur heard  along right sternal border, rubs, or gallops. No clubbing, cyanosis, edema through torso, generalized nonpitting lower extremity edema.  Radials/DP/PT 2+ and equal bilaterally.  Respiratory:  Respirations regular and unlabored, clear to auscultation bilaterally. GI: Soft, nontender, nondistended, BS + x 4.  Mild abdominal distention MS: no deformity or atrophy. Skin: warm and dry, no rash. Neuro:  Strength and sensation are intact. Psych: Normal affect.  Accessory Clinical Findings    ECG personally reviewed by me today-normal sinus rhythm 96 bpm- No acute changes  EKG 04/29/2019 Normal sinus rhythm with sinus arrhythmia left axis deviation LVH 82 bpm Echo 08/09/2019  IMPRESSIONS    1. Left ventricular ejection fraction, by visual estimation, is 60 to 65%. The left ventricle has normal function. There is moderately increased left ventricular hypertrophy.  2. Left ventricular diastolic parameters are consistent with Grade I diastolic dysfunction (impaired relaxation).  3. Global right ventricle has normal systolic function.The right ventricular size is normal. No increase in right ventricular wall thickness.  4. Left  atrial size was normal.  5. Right atrial size was normal.  6. The mitral valve is grossly normal. Trace mitral valve regurgitation.  7. The tricuspid valve is grossly normal. Tricuspid valve regurgitation is trivial.  8. Aortic valve area, by VTI measures 1.01 cm.  9. Aortic valve mean gradient measures 26.0 mmHg. 10. Aortic valve peak gradient measures 44.6 mmHg. 11. The aortic valve has an indeterminant number of cusps. Aortic valve regurgitation is not visualized. Moderate to severe aortic valve stenosis. 12. The pulmonic valve was grossly normal. Pulmonic valve regurgitation is not visualized. 13. The inferior vena cava is normal in size with greater than 50% respiratory variability, suggesting right atrial pressure of 3 mmHg. 14. The interatrial septum was not well visualized.  In comparison to the previous echocardiogram(s): Prior examinations were reviewed in a side by side comparison of images. 04/22/2017: EF 60-65%, moderate AS -mean gradient 27 mmHg.  Assessment & Plan   1.  Shortness of breath-only noting shortness of breath with exertion. Her atrial tachycardia or atrial fibrillation makes her shortness of breath worse due to a lack of atrial kick in the setting of her moderate to severe aortic stenosis.  She has noticed shortness of breath over the last 5 to 6 months. She states that since starting her increased dose of carvedilol she is feeling better.  Patient accumulates fluid in torso rather than lower extremities. Continue carvedilol 9.375 mg twice daily-we will consider increasing carvedilol dose after diuresis if her breathing is unchanged. Increase Lasix to 80 mg x 3 days and increase potassium to 60 mEq x 3 days twice daily then continue Lasix 40 mg tablet daily with 40 mEq twice daily Weight loss Increase physical activity as tolerated Order  BMP in 1 week  Atrial fibrillation/atrial tachycardia-EKG today normal sinus rhythm 96 bpm.  Pulse down to 73 bpm by the end of  her clinic visit. Continue carvedilol 9.375 twice daily Continue Eliquis 5 mg twice daily Avoid triggers caffeine, chocolate, nicotine and other stimulants   Essential hypertension-BP today 133/83 Continue carvedilol 9.375 twice daily Spironolactone 50 mg daily Heart healthy low-sodium diet-salty 6 given Increase physical activity as tolerated  Hyperlipidemia-LDL 69 02/11/2017 Continue atorvastatin 20 mg 3 times per week Heart healthy low-sodium high-fiber diet Increase physical activity as tolerated Managed by PCP  Obstructive sleep apnea-compliant with CPAP Continue use  Disposition: Follow-up with APP in 1 week.   Jossie Ng. [facility] [address] Office [phone] Fax [phone]

## 2019-08-19 ENCOUNTER — Other Ambulatory Visit: Payer: Self-pay

## 2019-08-19 ENCOUNTER — Encounter: Payer: Self-pay | Admitting: General Practice

## 2019-08-19 ENCOUNTER — Ambulatory Visit: Payer: Medicare Other | Admitting: General Practice

## 2019-08-19 VITALS — BP 133/83 | HR 73 | Ht 66.0 in | Wt 192.0 lb

## 2019-08-19 DIAGNOSIS — I428 Other cardiomyopathies: Secondary | ICD-10-CM | POA: Diagnosis not present

## 2019-08-19 DIAGNOSIS — I5023 Acute on chronic systolic (congestive) heart failure: Secondary | ICD-10-CM | POA: Diagnosis not present

## 2019-08-19 DIAGNOSIS — I350 Nonrheumatic aortic (valve) stenosis: Secondary | ICD-10-CM | POA: Diagnosis not present

## 2019-08-19 DIAGNOSIS — I4819 Other persistent atrial fibrillation: Secondary | ICD-10-CM | POA: Diagnosis not present

## 2019-08-19 DIAGNOSIS — I10 Essential (primary) hypertension: Secondary | ICD-10-CM

## 2019-08-19 NOTE — Patient Instructions (Addendum)
Medication Instructions:   TAKE Furosemide 80 mg (two 40 mg tablets) for three days.  TAKE Potassium 60 mEq (three 20 mEq tablets) for three days.  *If you need a refill on your cardiac medications before your next appointment, please call your pharmacy*  Lab Work: Your physician recommends that you return for lab work in 1 week: BMET  If you have labs (blood work) drawn today and your tests are completely normal, you will receive your results only by: [name] MyChart Message (if you have MyChart) OR . A paper copy in the mail If you have any lab test that is abnormal or we need to change your treatment, we will call you to review the results.   Follow-Up: At [facility], you and your health needs are our priority.  As part of our continuing mission to provide you with exceptional heart care, we have created designated Provider Care Teams.  These Care Teams include your primary Cardiologist (physician) and Advanced Practice Providers (APPs -  Physician Assistants and Nurse Practitioners) who all work together to provide you with the care you need, when you need it.  Your next appointment:   Friday, 08/27/19 at 8:30 AM  The format for your next appointment:   In Person  Provider:   Fabian Sharp, PA   Other Instructions:  Your physician has requested that you regularly monitor and record your blood pressure readings at home. Please use the same machine at the same time of day to check your readings and record them to bring to your follow-up visit.

## 2019-08-20 NOTE — Addendum Note (Signed)
Addended by: Crissie Reese on: 08/20/2019 03:25 PM   Modules accepted: Orders

## 2019-08-25 NOTE — Progress Notes (Signed)
Cardiology Office Note:    Date:  08/27/2019   ID:  Santi, Benitz [birth year], MRN [id number]  PCP:  Chesley Noon, MD  Cardiologist:  [name], MD   Referring MD: Chesley Noon, MD   Chief Complaint  Patient presents with  . Follow-up    CHF    History of Present Illness:    Jenna [name] is a 78 y.o. female with a hx of chronic diastolic heart failure, paroxysmal Afib s/p ablation, AS, HTN, and OSA. She was seen by Dr. Debara Pickett 07/23/19 - pt had recurrent tachycardia felt possibly atrial tacychardia. She was seen in Afib clinic and coreg was increased, which improved her rates. Echo was repeated on 08/09/19 to evaluate AS in the setting of worsening SOB and fatigue. Echo showed a progression to moderate to severe AS with mean gradient of 26 mmHg and peak gradient 44.6 mmHg; aortic valve area was 1.01 cm2. She was seen in clinic by Coletta Memos NP for progressive SOB and fatigue; although she does report some improvement after coreg was increased.  He increased her lasix to 80 mg x 3 day and increase potassium to 60 mEq BID. She normally take 40 mg lasix and 40 mg Kdur BID.    She presents back today for follow up. She brings her weight, BP, and HR log. Her pressures are well-controlled. HR has been in the 100s at times at home, other times in the 80s. EKG today with sinus rhythm.   Her weight decreased to 188 lbs on the 80 mg lasix, and remained there for several days. Today, her weight was up approximately 2 lbs. She felt well at 188 lbs, but wants to get down to 180-185 lbs. I discussed that a goal weight of 180 lbs may need to be reached through calorie restriction. By exam, I'm not convinced she has another 8 lbs of fluid on her.  She has had some tachycardia at home with HR in the 100s. This is likely atrial tachycardia - she was very aware of her Afib. She does not think she is having Afib, but does complain of fatigue with the faster heart rates. She is a great  historian and diligently records her vitals daily.    Past Medical History:  Diagnosis Date  . Anxiety   . CHF (congestive heart failure) ([place])   . Chronic lower back pain   . Chronic systolic dysfunction of left ventricle   . Depression   . Dysrhythmia    A-Fib  . Fatigue   . GERD (gastroesophageal reflux disease)   . High cholesterol   . Hypertension   . Hypothyroidism   . Melanoma of shoulder, left ([place])   . Mild aortic stenosis   . Nonischemic cardiomyopathy ([place])   . OSA on CPAP    reports compliance with CPAP  . Osteoarthritis   . Persistent atrial fibrillation with RVR ([place])    [name] 11/15/2016  . PONV (postoperative nausea and vomiting)   . RLS (restless legs syndrome)   . S/P ablation of atrial flutter/ fib 01/10/17 01/11/2017  . Type II diabetes mellitus (HCC)    no medications    Past Surgical History:  Procedure Laterality Date  . ATRIAL FIBRILLATION ABLATION N/A 01/10/2017   Procedure: Atrial Fibrillation Ablation;  Surgeon: Thompson Grayer, MD;  Location: [place] [facility];  Service: Cardiovascular;  Laterality: N/A;  . CARDIOVERSION N/A 11/19/2016   Procedure: CARDIOVERSION;  Surgeon: Lelon Perla, MD;  Location: [place];  Service: Cardiovascular;  Laterality: N/A;  . CARDIOVERSION N/A 12/11/2016   Procedure: CARDIOVERSION;  Surgeon: Dorothy Spark, MD;  Location: [place];  Service: Cardiovascular;  Laterality: N/A;  . CARDIOVERSION N/A 12/18/2016   Procedure: CARDIOVERSION;  Surgeon: Skeet Latch, MD;  Location: [place];  Service: Cardiovascular;  Laterality: N/A;  . CATARACT EXTRACTION W/ INTRAOCULAR LENS  IMPLANT, BILATERAL Bilateral   . DILATION AND CURETTAGE OF UTERUS    . LEXISCAN MYOVIEW  05/13/2012   No ECG changes. EKG negative for ischemia. No significant ischemia demonstrated.  [name] MELANOMA EXCISION Left ~ 09/1979   "back of my shoulder"  . TEE WITHOUT CARDIOVERSION N/A 11/19/2016   Procedure: TRANSESOPHAGEAL ECHOCARDIOGRAM (TEE);   Surgeon: Lelon Perla, MD;  Location: [facility] ENDOSCOPY;  Service: Cardiovascular;  Laterality: N/A;  need CV, but no anesthesia available  . TEE WITHOUT CARDIOVERSION N/A 01/10/2017   Procedure: TRANSESOPHAGEAL ECHOCARDIOGRAM (TEE);  Surgeon: [name], MD;  Location: [facility] ENDOSCOPY;  Service: Cardiovascular;  Laterality: N/A;  . TOTAL KNEE ARTHROPLASTY Right 07/28/2018   Procedure: RIGHT TOTAL KNEE ARTHROPLASTY;  Surgeon: Mcarthur Rossetti, MD;  Location: [place];  Service: Orthopedics;  Laterality: Right;  . TRANSTHORACIC ECHOCARDIOGRAM  02/18/2012   EF >55%, mild aortic stenosis  . VAGINAL HYSTERECTOMY  1985    Current Medications: Current Meds  Medication Sig  . acetaminophen (TYLENOL) 500 MG tablet Take 500-1,000 mg by mouth every 6 (six) hours as needed for mild pain, moderate pain or headache.  . ALPRAZolam (XANAX) 0.5 MG tablet Take 0.5 mg by mouth 2 (two) times daily.   [name] atorvastatin (LIPITOR) 20 MG tablet Take 20 mg by mouth 3 (three) times a week.  [name] buPROPion (WELLBUTRIN XL) 150 MG 24 hr tablet Take 300 mg by mouth daily.   . Calcium Carbonate-Vitamin D (CALCIUM + D PO) Take 1 tablet by mouth 2 (two) times daily.   . carvedilol (COREG) 12.5 MG tablet Take 1 tablet (12.5 mg total) by mouth 2 (two) times daily.  [name] ELIQUIS 5 MG TABS tablet TAKE 1 TABLET BY MOUTH TWICE A DAY  . furosemide (LASIX) 40 MG tablet Take 40 mg by mouth daily.   [name] KLOR-CON M20 20 MEQ tablet TAKE 2 TABLETS (40 MEQ TOTAL) BY MOUTH 2 (TWO) TIMES DAILY.  [name] levothyroxine (SYNTHROID, LEVOTHROID) 88 MCG tablet Take 88 mcg by mouth daily before breakfast.  . Magnesium 500 MG TABS Take 500 mg by mouth daily.   . Multiple Vitamins-Minerals (PRESERVISION AREDS 2) CAPS Take 1 capsule by mouth 2 (two) times daily.  [name] omeprazole (PRILOSEC) 40 MG capsule Take 40 mg by mouth daily.  Vladimir Faster Glycol-Propyl Glycol (SYSTANE) 0.4-0.3 % SOLN Place 2 drops into both eyes 4 (four) times daily.  [name] pyridoxine (B-6) 100 MG  tablet Take 100 mg by mouth daily with supper.   [name] rOPINIRole (REQUIP) 0.5 MG tablet Take 0.5 mg by mouth 4 (four) times daily.   [name] spironolactone (ALDACTONE) 50 MG tablet Take 1 tablet by mouth daily.  . vitamin B-12 (CYANOCOBALAMIN) 1000 MCG tablet Take 1,000 mcg by mouth daily.  . [DISCONTINUED] carvedilol (COREG) 6.25 MG tablet Take 1.5 tablets (9.375 mg total) by mouth 2 (two) times daily.     Allergies:   Amoxicillin, Crestor [rosuvastatin], and Lovastatin   Social History   Socioeconomic History  . Marital status: Widowed    Spouse name: Not on file  . Number of children: Not on file  .  Years of education: Not on file  . Highest education level: Not on file  Occupational History  . Not on file  Social Needs  . Financial resource strain: Not on file  . Food insecurity    Worry: Not on file    Inability: Not on file  . Transportation needs    Medical: Not on file    Non-medical: Not on file  Tobacco Use  . Smoking status: Never Smoker  . Smokeless tobacco: Never Used  Substance and Sexual Activity  . Alcohol use: No  . Drug use: Never  . Sexual activity: Never  Lifestyle  . Physical activity    Days per week: Not on file    Minutes per session: Not on file  . Stress: Not on file  Relationships  . Social Herbalist on phone: Not on file    Gets together: Not on file    Attends religious service: Not on file    Active member of club or organization: Not on file    Attends meetings of clubs or organizations: Not on file    Relationship status: Not on file  Other Topics Concern  . Not on file  Social History Narrative  . Not on file     Family History: The patient's family history includes Arrhythmia in her sister; Cancer in her paternal grandfather; Diabetes in her mother; Healthy in her brother and daughter; Heart failure in her father; Hyperlipidemia in her sister and son; Hypertension in her mother, sister, and son; Stroke in her paternal  grandmother.  ROS:   Please see the history of present illness.     All other systems reviewed and are negative.  EKGs/Labs/Other Studies Reviewed:    The following studies were reviewed today:  Echo 08/09/19:  1. Left ventricular ejection fraction, by visual estimation, is 60 to 65%. The left ventricle has normal function. There is moderately increased left ventricular hypertrophy.  2. Left ventricular diastolic parameters are consistent with Grade I diastolic dysfunction (impaired relaxation).  3. Global right ventricle has normal systolic function.The right ventricular size is normal. No increase in right ventricular wall thickness.  4. Left atrial size was normal.  5. Right atrial size was normal.  6. The mitral valve is grossly normal. Trace mitral valve regurgitation.  7. The tricuspid valve is grossly normal. Tricuspid valve regurgitation is trivial.  8. Aortic valve area, by VTI measures 1.01 cm.  9. Aortic valve mean gradient measures 26.0 mmHg. 10. Aortic valve peak gradient measures 44.6 mmHg. 11. The aortic valve has an indeterminant number of cusps. Aortic valve regurgitation is not visualized. Moderate to severe aortic valve stenosis. 12. The pulmonic valve was grossly normal. Pulmonic valve regurgitation is not visualized. 13. The inferior vena cava is normal in size with greater than 50% respiratory variability, suggesting right atrial pressure of 3 mmHg. 14. The interatrial septum was not well visualized.  EKG:  EKG is  ordered today.  The ekg ordered today demonstrates sinus rhythm HR 92  Recent Labs: 09/13/2018: BUN 22; Creatinine, Ser 0.84; Hemoglobin 11.3; Platelets 368; Potassium 4.0; Sodium 136  Recent Lipid Panel    Component Value Date/Time   CHOL 170 02/11/2017 0941   TRIG 93 02/11/2017 0941   HDL 82 02/11/2017 0941   CHOLHDL 2.1 02/11/2017 0941   VLDL 19 02/11/2017 0941   LDLCALC 69 02/11/2017 0941    Physical Exam:    VS:  BP 114/70   Pulse (!)  107  Ht 5\' 6"  (1.676 m)   Wt 193 lb (87.5 kg)   SpO2 98%   BMI 31.15 kg/m     Wt Readings from Last 3 Encounters:  08/27/19 193 lb (87.5 kg)  08/19/19 192 lb (87.1 kg)  07/23/19 188 lb 6.4 oz (85.5 kg)     GEN: Well nourished, well developed in no acute distress HEENT: Normal NECK: No JVD; No carotid bruits LYMPHATICS: No lymphadenopathy CARDIAC: RRR, systolic murmur, no rubs, gallops RESPIRATORY:  Clear to auscultation without rales, wheezing or rhonchi  ABDOMEN: Soft, non-tender, non-distended MUSCULOSKELETAL:  No edema; No deformity  SKIN: Warm and dry NEUROLOGIC:  Alert and oriented x 3 PSYCHIATRIC:  Normal affect   ASSESSMENT:    1. Persistent atrial fibrillation ([place])   2. Nonischemic cardiomyopathy ([place])   3. Acute on chronic systolic congestive heart failure ([place])   4. Aortic valve stenosis, etiology of cardiac valve disease unspecified   5. Acute kidney injury superimposed on chronic kidney disease (HCC)    PLAN:    In order of problems listed above:  SOB, lower extremity swelling Chronic diastolic heart failure - both have resolved after 3 days of 80 mg lasix - we discussed strategies for controlling fluid - she may stay at 40 mg lasix unless she has 3 lb weight gain in 1 day or 5 lbs in 1 week - then she will increase to 80 mg lasix for two days - if she finds herself increasing to 80 mg lasix every week, then we may need to adjust her baseline dose of lasix to 60 mg - she has currently been on 40 mg lasix for 5 days and has been relatively stable - likely confounded by progression of AS, as below - will collect a BMP today   Moderate to severe aortic stenosis - gradients have worsened - I will reach out to Dr. Debara Pickett to see if we should send her to structural heart team    Paroxsymal atrial fibrillation Atrial tachcyardia - I will further increase coreg to 12.5 mg BID - continue 5 mg eliquis BID - This patients CHA2DS2-VASc Score and unadjusted  Ischemic Stroke Rate (% per year) is equal to 7.2 % stroke rate/year from a score of 5 (female, age, HTN, CHF)   Hypertension - increase coreg to 12.5 mg BID, continue spironolactone 50 mg - pressures have been well-controlled   Follow up with me in January.    Medication Adjustments/Labs and Tests Ordered: Current medicines are reviewed at length with the patient today.  Concerns regarding medicines are outlined above.  Orders Placed This Encounter  Procedures  . Brain natriuretic peptide  . EKG 12-Lead   Meds ordered this encounter  Medications  . carvedilol (COREG) 12.5 MG tablet    Sig: Take 1 tablet (12.5 mg total) by mouth 2 (two) times daily.    Dispense:  90 tablet    Refill:  1    Signed, Ledora Bottcher, [place]  08/27/2019 10:13 AM    [place]

## 2019-08-27 ENCOUNTER — Other Ambulatory Visit: Payer: Self-pay

## 2019-08-27 ENCOUNTER — Encounter: Payer: Self-pay | Admitting: Physician Assistant

## 2019-08-27 ENCOUNTER — Ambulatory Visit: Payer: Medicare Other | Admitting: Physician Assistant

## 2019-08-27 VITALS — BP 114/70 | HR 107 | Ht 66.0 in | Wt 193.0 lb

## 2019-08-27 DIAGNOSIS — I428 Other cardiomyopathies: Secondary | ICD-10-CM

## 2019-08-27 DIAGNOSIS — I119 Hypertensive heart disease without heart failure: Secondary | ICD-10-CM

## 2019-08-27 DIAGNOSIS — I480 Paroxysmal atrial fibrillation: Secondary | ICD-10-CM | POA: Diagnosis not present

## 2019-08-27 DIAGNOSIS — I5032 Chronic diastolic (congestive) heart failure: Secondary | ICD-10-CM | POA: Diagnosis not present

## 2019-08-27 DIAGNOSIS — I350 Nonrheumatic aortic (valve) stenosis: Secondary | ICD-10-CM | POA: Diagnosis not present

## 2019-08-27 DIAGNOSIS — I10 Essential (primary) hypertension: Secondary | ICD-10-CM

## 2019-08-27 LAB — BASIC METABOLIC PANEL
BUN/Creatinine Ratio: 27 (ref 12–28)
BUN: 34 mg/dL — ABNORMAL HIGH (ref 8–27)
CO2: 26 mmol/L (ref 20–29)
Calcium: 10 mg/dL (ref 8.7–10.3)
Chloride: 97 mmol/L (ref 96–106)
Creatinine, Ser: 1.28 mg/dL — ABNORMAL HIGH (ref 0.57–1.00)
GFR calc Af Amer: 47 mL/min/{1.73_m2} — ABNORMAL LOW (ref >59)
GFR calc non Af Amer: 40 mL/min/{1.73_m2} — ABNORMAL LOW (ref >59)
Glucose: 97 mg/dL (ref 65–99)
Potassium: 4.7 mmol/L (ref 3.5–5.2)
Sodium: 137 mmol/L (ref 134–144)

## 2019-08-27 MED ORDER — CARVEDILOL 12.5 MG PO TABS: 12.5000 mg | ORAL_TABLET | Freq: Two times a day (BID) | ORAL | 1 refills | Status: DC

## 2019-08-27 NOTE — Patient Instructions (Signed)
Medication Instructions:  Increase Lasix to 80 mg daily for two days.  Increase Potassium to 60 mEq twice daily for two days.  Then, go back to 40 mg Lasix daily and 40 mg Potassium twice daily.  *If you need a refill on your cardiac medications before your next appointment, please call your pharmacy*   Follow-Up: At [facility], you and your health needs are our priority.  As part of our continuing mission to provide you with exceptional heart care, we have created designated Provider Care Teams.  These Care Teams include your primary Cardiologist (physician) and Advanced Practice Providers (APPs -  Physician Assistants and Nurse Practitioners) who all work together to provide you with the care you need, when you need it.  Your next appointment:   Thursday, 10/21/19 at 8:30 AM IN OFFICE---this may change to virtual. We will call you to let you know.  Provider:   Fabian Sharp, PA

## 2019-09-01 ENCOUNTER — Telehealth: Payer: Self-pay | Admitting: Physician Assistant

## 2019-09-01 DIAGNOSIS — I350 Nonrheumatic aortic (valve) stenosis: Secondary | ICD-10-CM

## 2019-09-01 NOTE — Telephone Encounter (Signed)
Case discussed with Dr. Debara Pickett. He has advised to repeat an echocardiogram in 6 months to evaluate aortic stenosis. If mean gradient is significantly worse, then I will refer to structural heart clinic.  I called and left a message.

## 2019-09-01 NOTE — Telephone Encounter (Addendum)
Patient has been notified and voiced understanding. Message sent to schedulers to reach out to patient to schedule repeat echo.   patient states her weight is trickling up  Friday 190.2 Saturday 187.6 Sunday 187.8 Monday 188.4 Tuesday 189.4 Wednesday (today) 190  She wanted me to give you these readings.

## 2019-09-21 DIAGNOSIS — N183 Chronic kidney disease, stage 3 unspecified: Secondary | ICD-10-CM | POA: Insufficient documentation

## 2019-09-21 DIAGNOSIS — F331 Major depressive disorder, recurrent, moderate: Secondary | ICD-10-CM | POA: Insufficient documentation

## 2019-10-03 ENCOUNTER — Other Ambulatory Visit: Payer: Self-pay | Admitting: Internal Medicine

## 2019-10-12 IMAGING — MG DIGITAL SCREENING BILATERAL MAMMOGRAM WITH TOMO AND CAD
8 series · 8 of 24 positions shown · non-contrast
Comparison: Previous exam(s).

CLINICAL DATA: Screening.

EXAM:
DIGITAL SCREENING BILATERAL MAMMOGRAM WITH TOMO AND CAD

[R CC synth-2D]
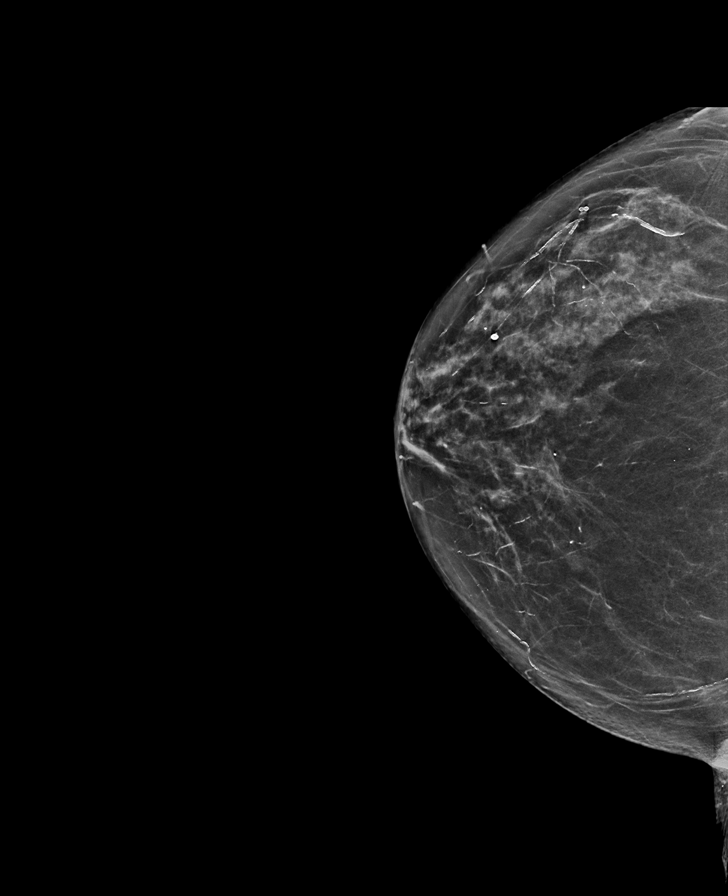

[L MLO synth-2D]
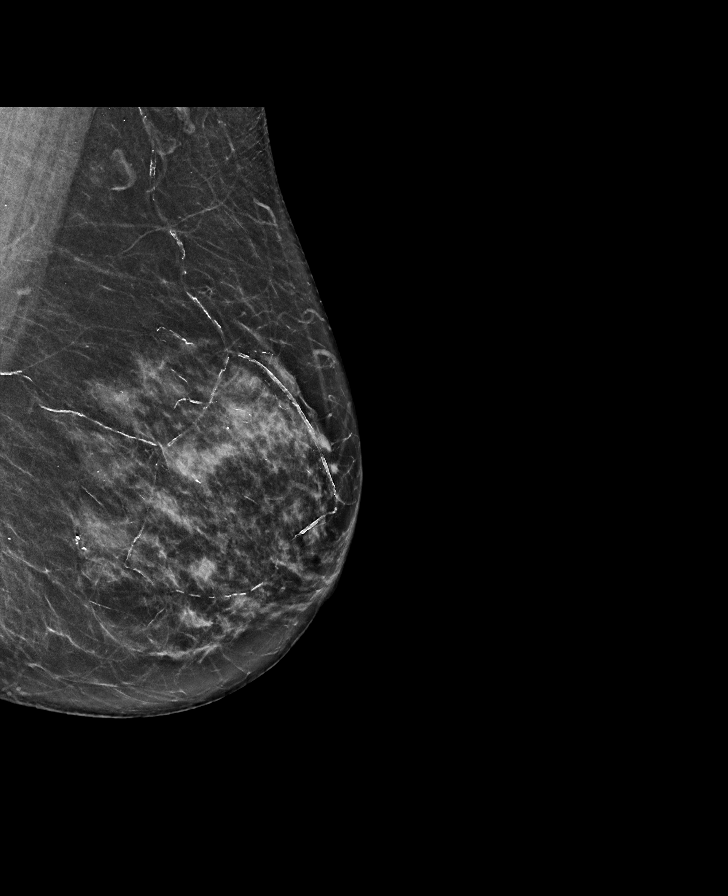

[R MLO synth-2D]
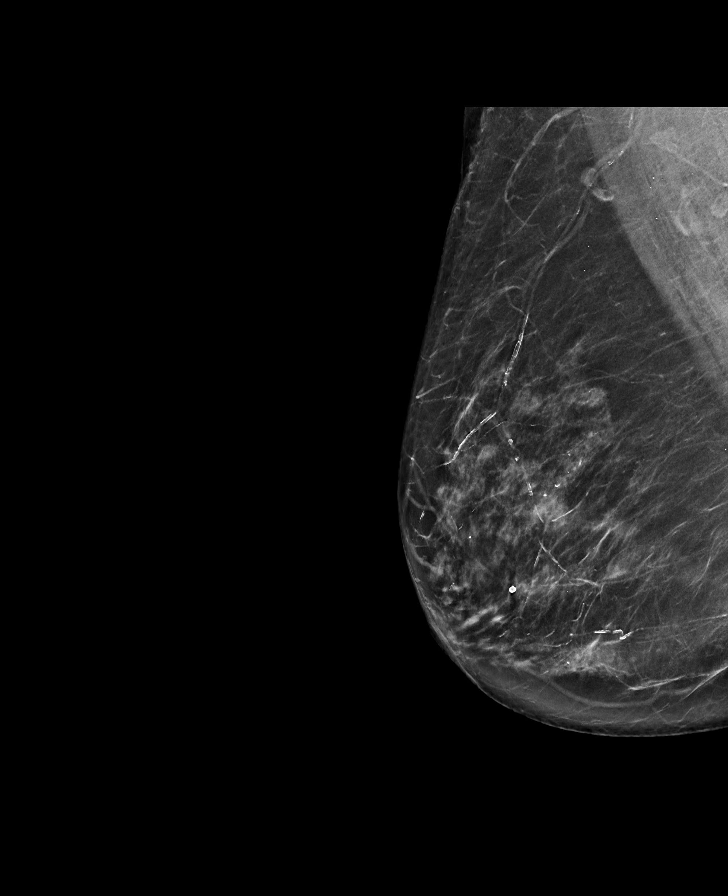

[L CC synth-2D]
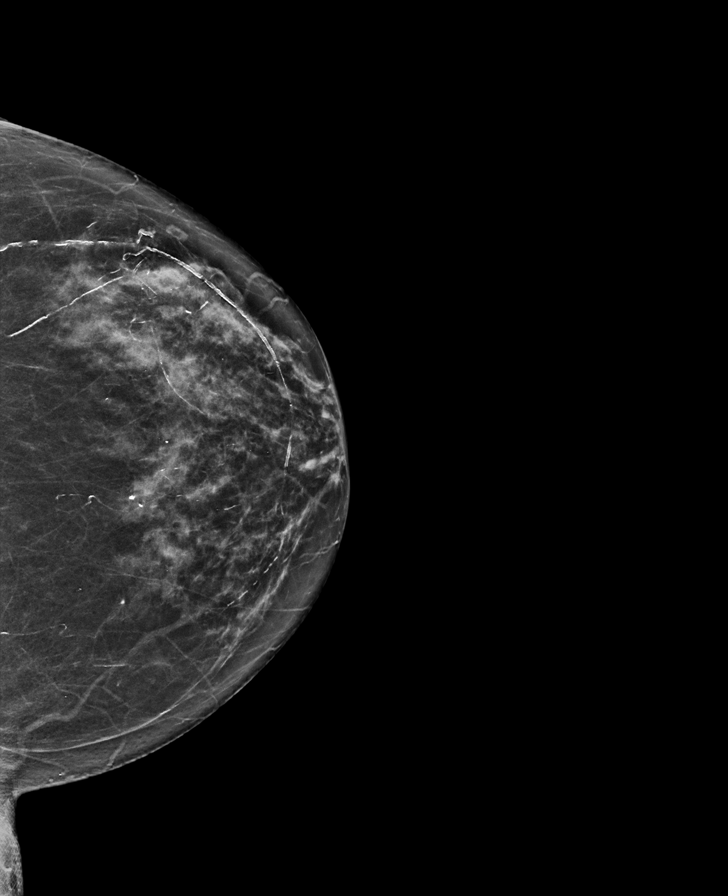

[L MLO tomo · tomo slice 37/72.0]
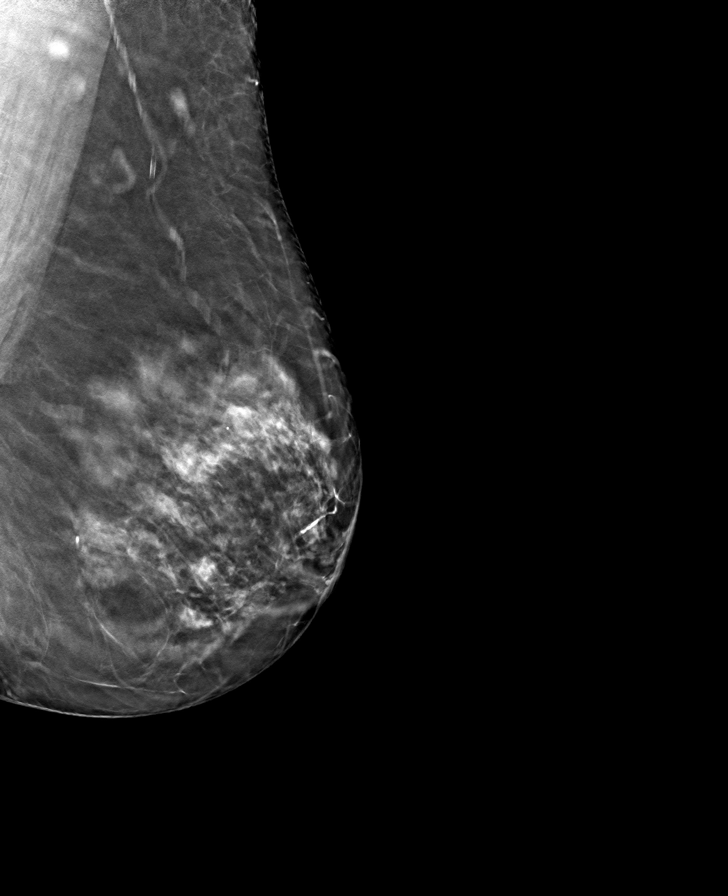

[R CC tomo · tomo slice 34/67.0]
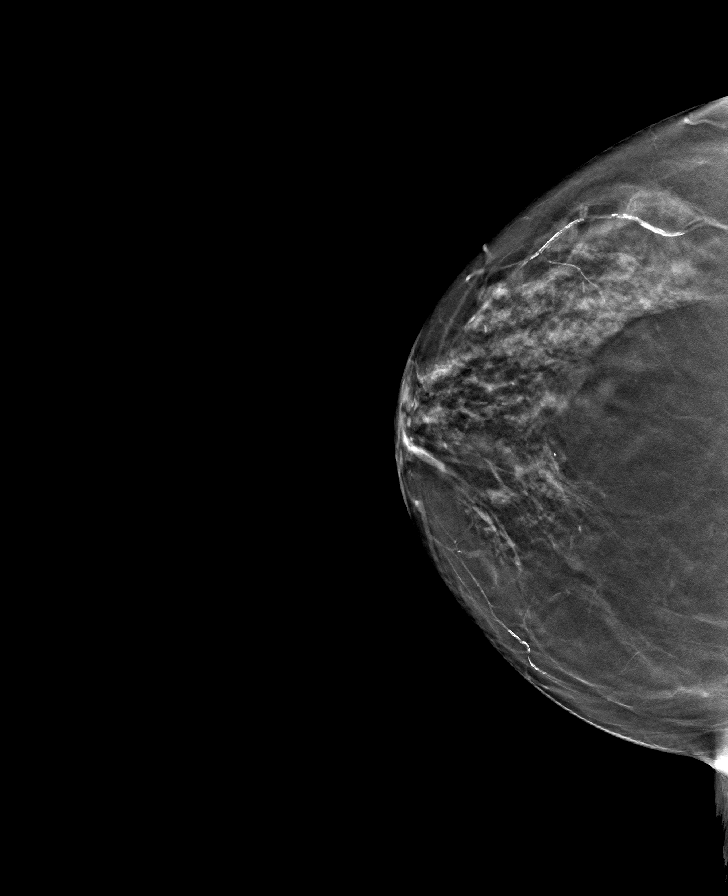

[R MLO tomo · tomo slice 39/76.0]
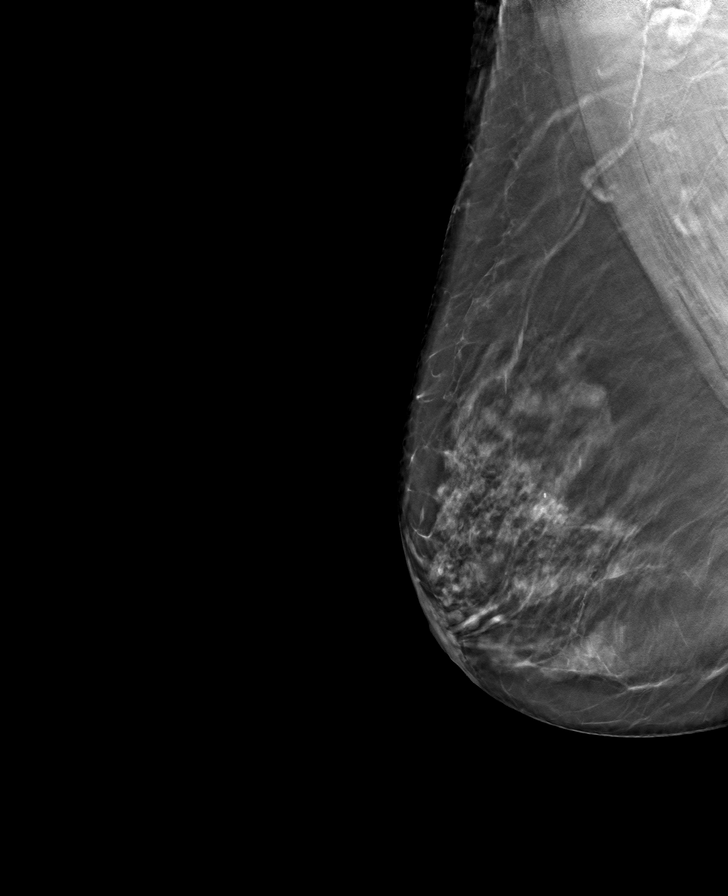

[L CC tomo · tomo slice 32/63.0]
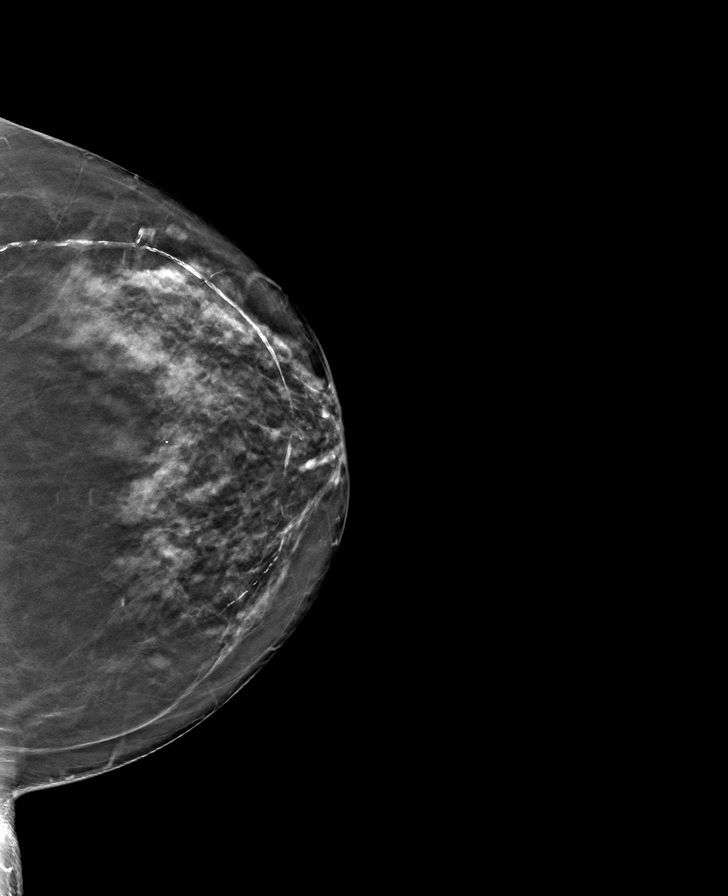

[8 of 24 positions shown; findings below may reference images not displayed]

ACR Breast Density Category c: The breast tissue is heterogeneously
dense, which may obscure small masses.
FINDINGS: There are no findings suspicious for malignancy. Images were
processed with CAD.
IMPRESSION: No mammographic evidence of malignancy. A result letter of this
screening mammogram will be mailed directly to the patient.

RECOMMENDATION:
Screening mammogram in one year. (Code:[id number]-U-LHB)

BI-RADS CATEGORY  1: Negative.

## 2019-10-14 NOTE — Progress Notes (Deleted)
Cardiology Office Note:    Date:  10/14/2019   ID:  Jenna [name], Jenna [name] 09/01/1941, MRN [id number]  PCP:  Chesley Noon, MD  Cardiologist:  [name], MD   Referring MD: Chesley Noon, MD   No chief complaint on file. ***  History of Present Illness:    Jenna [name] is a 79 y.o. female with a hx of chronic systolic heart failure, paroxysmal Afib s/p ablation, AS, HTN, and OSA. She was seen by Dr. Debara Pickett 07/23/19 - pt had recurrent tachycardia felt possibly atrial tacychardia. She was seen in Afib clinic and coreg was increased, which improved her rates. Echo was repeated on 08/09/19 to evaluate AS in the setting of worsening SOB and fatigue. Echo showed a progression to moderate to severe AS with mean gradient of 26 mmHg and peak gradient 44.6 mmHg; aortic valve area was 1.01 cm2. She was seen in clinic by Coletta Memos NP for progressive SOB and fatigue; although she does report some improvement after coreg was increased.  He increased her lasix to 80 mg x 3 days with close follow up. I last saw her in clinic on 08/27/19. She felt well after the increased lasix and was back down to 40 mg lasix daily with instructions on how to titrate PRN. Aortic valve stenosis discussed with Dr. Debara Pickett who advised repeat echo in 6 months (this has been scheduled).     Chronic diastolic heart failure Previously stable on 40 mg lasix daily.  Previously discussed possibly moving up to baseline 60 mg lasix if she was frequently taking 80 mg  Unclear what role AS progression may be playing in her dyspnea   Moderate to severe aortic stenosis - will repeat echo in May 2021   Parosyxmal atrial fibrillation Atrial tachcyardia - coreg increased to 12.5 mg BID at last visit - continue eliquis for This patients CHA2DS2-VASc Score and unadjusted Ischemic Stroke Rate (% per year) is equal to 7.2 % stroke rate/year from a score of 5 (female, age2, HTN, CHF)   Hypertension     Past Medical  History:  Diagnosis Date  . Anxiety   . CHF (congestive heart failure) ([place])   . Chronic lower back pain   . Chronic systolic dysfunction of left ventricle   . Depression   . Dysrhythmia    A-Fib  . Fatigue   . GERD (gastroesophageal reflux disease)   . High cholesterol   . Hypertension   . Hypothyroidism   . Melanoma of shoulder, left ([place])   . Mild aortic stenosis   . Nonischemic cardiomyopathy ([place])   . OSA on CPAP    reports compliance with CPAP  . Osteoarthritis   . Persistent atrial fibrillation with RVR ([place])    [name] 11/15/2016  . PONV (postoperative nausea and vomiting)   . RLS (restless legs syndrome)   . S/P ablation of atrial flutter/ fib 01/10/17 01/11/2017  . Type II diabetes mellitus (HCC)    no medications    Past Surgical History:  Procedure Laterality Date  . ATRIAL FIBRILLATION ABLATION N/A 01/10/2017   Procedure: Atrial Fibrillation Ablation;  Surgeon: Thompson Grayer, MD;  Location: [place] [facility];  Service: Cardiovascular;  Laterality: N/A;  . CARDIOVERSION N/A 11/19/2016   Procedure: CARDIOVERSION;  Surgeon: Lelon Perla, MD;  Location: [facility] ENDOSCOPY;  Service: Cardiovascular;  Laterality: N/A;  . CARDIOVERSION N/A 12/11/2016   Procedure: CARDIOVERSION;  Surgeon: Dorothy Spark, MD;  Location: [place];  Service: Cardiovascular;  Laterality: N/A;  . CARDIOVERSION N/A 12/18/2016   Procedure: CARDIOVERSION;  Surgeon: Skeet Latch, MD;  Location: [facility] ENDOSCOPY;  Service: Cardiovascular;  Laterality: N/A;  . CATARACT EXTRACTION W/ INTRAOCULAR LENS  IMPLANT, BILATERAL Bilateral   . DILATION AND CURETTAGE OF UTERUS    . LEXISCAN MYOVIEW  05/13/2012   No ECG changes. EKG negative for ischemia. No significant ischemia demonstrated.  [name] MELANOMA EXCISION Left ~ 09/1979   "back of my shoulder"  . TEE WITHOUT CARDIOVERSION N/A 11/19/2016   Procedure: TRANSESOPHAGEAL ECHOCARDIOGRAM (TEE);  Surgeon: Lelon Perla, MD;  [facility];  Service:  Cardiovascular;  Laterality: N/A;  need CV, but no anesthesia available  . TEE WITHOUT CARDIOVERSION N/A 01/10/2017   Procedure: TRANSESOPHAGEAL ECHOCARDIOGRAM (TEE);  Surgeon: [name], MD;  Location: [facility] ENDOSCOPY;  Service: Cardiovascular;  Laterality: N/A;  . TOTAL KNEE ARTHROPLASTY Right 07/28/2018   Procedure: RIGHT TOTAL KNEE ARTHROPLASTY;  Surgeon: Mcarthur Rossetti, MD;  Location: [place];  Service: Orthopedics;  Laterality: Right;  . TRANSTHORACIC ECHOCARDIOGRAM  02/18/2012   EF >55%, mild aortic stenosis  . VAGINAL HYSTERECTOMY  1985    Current Medications: No outpatient medications have been marked as taking for the 10/21/19 encounter (Appointment) with Ledora Bottcher, [place].     Allergies:   Amoxicillin, Crestor [rosuvastatin], and Lovastatin   Social History   Socioeconomic History  . Marital status: Widowed    Spouse name: Not on file  . Number of children: Not on file  . Years of education: Not on file  . Highest education level: Not on file  Occupational History  . Not on file  Tobacco Use  . Smoking status: Never Smoker  . Smokeless tobacco: Never Used  Substance and Sexual Activity  . Alcohol use: No  . Drug use: Never  . Sexual activity: Never  Other Topics Concern  . Not on file  Social History Narrative  . Not on file   Social Determinants of Health   Financial Resource Strain:   . Difficulty of Paying Living Expenses: Not on file  Food Insecurity:   . Worried About Charity fundraiser in the Last Year: Not on file  . Ran Out of Food in the Last Year: Not on file  Transportation Needs:   . Lack of Transportation (Medical): Not on file  . Lack of Transportation (Non-Medical): Not on file  Physical Activity:   . Days of Exercise per Week: Not on file  . Minutes of Exercise per Session: Not on file  Stress:   . Feeling of Stress : Not on file  Social Connections:   . Frequency of Communication with Friends and Family: Not on file  .  Frequency of Social Gatherings with Friends and Family: Not on file  . Attends Religious Services: Not on file  . Active Member of Clubs or Organizations: Not on file  . Attends Archivist Meetings: Not on file  . Marital Status: Not on file     Family History: The patient's family history includes Arrhythmia in her sister; Cancer in her paternal grandfather; Diabetes in her mother; Healthy in her brother and daughter; Heart failure in her father; Hyperlipidemia in her sister and son; Hypertension in her mother, sister, and son; Stroke in her paternal grandmother.  ROS:   Please see the history of present illness.    *** All other systems reviewed and are negative.  EKGs/Labs/Other Studies Reviewed:    The following studies were reviewed  today: ***  EKG:  EKG is *** ordered today.  The ekg ordered today demonstrates ***  Recent Labs: 08/27/2019: BUN 34; Creatinine, Ser 1.28; Potassium 4.7; Sodium 137  Recent Lipid Panel    Component Value Date/Time   CHOL 170 02/11/2017 0941   TRIG 93 02/11/2017 0941   HDL 82 02/11/2017 0941   CHOLHDL 2.1 02/11/2017 0941   VLDL 19 02/11/2017 0941   LDLCALC 69 02/11/2017 0941    Physical Exam:    VS:  There were no vitals taken for this visit.    Wt Readings from Last 3 Encounters:  08/27/19 193 lb (87.5 kg)  08/19/19 192 lb (87.1 kg)  07/23/19 188 lb 6.4 oz (85.5 kg)     GEN: *** Well nourished, well developed in no acute distress HEENT: Normal NECK: No JVD; No carotid bruits LYMPHATICS: No lymphadenopathy CARDIAC: ***RRR, no murmurs, rubs, gallops RESPIRATORY:  Clear to auscultation without rales, wheezing or rhonchi  ABDOMEN: Soft, non-tender, non-distended MUSCULOSKELETAL:  No edema; No deformity  SKIN: Warm and dry NEUROLOGIC:  Alert and oriented x 3 PSYCHIATRIC:  Normal affect   ASSESSMENT:    No diagnosis found. PLAN:    In order of problems listed above:  No diagnosis found.   Medication  Adjustments/Labs and Tests Ordered: Current medicines are reviewed at length with the patient today.  Concerns regarding medicines are outlined above.  No orders of the defined types were placed in this encounter.  No orders of the defined types were placed in this encounter.   Signed, Ledora Bottcher, PA  10/14/2019 4:00 PM    [facility]

## 2019-10-21 ENCOUNTER — Ambulatory Visit: Payer: Medicare Other | Admitting: Physician Assistant

## 2019-10-25 NOTE — Progress Notes (Signed)
Virtual Visit via Telephone Note   This visit type was conducted due to national recommendations for restrictions regarding the COVID-19 Pandemic (e.g. social distancing) in an effort to limit this patient's exposure and mitigate transmission in our community.  Due to her co-morbid illnesses, this patient is at least at moderate risk for complications without adequate follow up.  This format is felt to be most appropriate for this patient at this time.  The patient did not have access to video technology/had technical difficulties with video requiring transitioning to audio format only (telephone).  All issues noted in this document were discussed and addressed.  No physical exam could be performed with this format.  Please refer to the patient's chart for her  consent to telehealth for [facility]   Date:  10/26/2019   ID:  Jenna [name], Jenna [name] 1941-05-26, MRN [id number]  Patient Location: Home Provider Location: Office  PCP:  Jenna Noon, MD  Cardiologist:  Jenna Casino, MD  Electrophysiologist:  None   Evaluation Performed:  Follow-Up Visit  Chief Complaint:  Follow up dyspnea  History of Present Illness:    Jenna [name] is a 79 y.o. female with a hx of chronic systolic heart failure, paroxysmal Afib s/p ablation, AS, HTN, and OSA. She was seen by Jenna [name] 07/23/19 - pt had recurrent tachycardia felt possibly atrial tacychardia. She was seen in Afib clinic and coreg was increased, which improved her rates. Echo was repeated on 08/09/19 to evaluate AS in the setting of worsening SOB and fatigue. Echo showed a progression to moderate to severe AS with mean gradient of 26 mmHg and peak gradient 44.6 mmHg; aortic valve area was 1.01 cm2. She was seen in clinic by Jenna [name] for progressive SOB and fatigue; although she does report some improvement after coreg was increased.  He increased her lasix to 80 mg x 3 days with close follow up. I last saw her in clinic on  08/27/19. She felt well after the increased lasix and was back down to 40 mg lasix daily with instructions on how to titrate PRN. Aortic valve stenosis discussed with Jenna [name] who advised repeat echo in 6 months (this has been scheduled).   She presents today after not feeling well. They had several COVID-19+ cases in the family at Christmas. She got labs with her PCP the week before Christmas and had elevated K and sCr. She reports that she was dehydrated. PCP reduced lasix to 20 mg, D/C'ed potassium supplement, and reduced spironolactone to 25 mg. She reports feeling well. PCP will recheck labs on December 22, 2019. She denies SOB, DOE, orthopnea, and leg swelling. No anginal symptoms. Her weight is stable. She is overall doing very well from a cardiac standpoint.  She is worried that she does not have her annual appt with Jenna [name] or her regular July appt with Jenna [name].    The patient does not have symptoms concerning for COVID-19 infection (fever, chills, cough, or new shortness of breath).    Past Medical History:  Diagnosis Date  . Anxiety   . CHF (congestive heart failure) ([place])   . Chronic lower back pain   . Chronic systolic dysfunction of left ventricle   . Depression   . Dysrhythmia    A-Fib  . Fatigue   . GERD (gastroesophageal reflux disease)   . High cholesterol   . Hypertension   . Hypothyroidism   . Melanoma of shoulder, left ([place])   .  Mild aortic stenosis   . Nonischemic cardiomyopathy ([place])   . OSA on CPAP    reports compliance with CPAP  . Osteoarthritis   . Persistent atrial fibrillation with RVR ([place])    Jenna [name] 11/15/2016  . PONV (postoperative nausea and vomiting)   . RLS (restless legs syndrome)   . S/P ablation of atrial flutter/ fib 01/10/17 01/11/2017  . Type II diabetes mellitus (HCC)    no medications   Past Surgical History:  Procedure Laterality Date  . ATRIAL FIBRILLATION ABLATION N/A 01/10/2017   Procedure: Atrial Fibrillation Ablation;  Surgeon:  Thompson Grayer, MD;  Location: [place] CV LAB;  Service: Cardiovascular;  Laterality: N/A;  . CARDIOVERSION N/A 11/19/2016   Procedure: CARDIOVERSION;  Surgeon: Lelon Perla, MD;  Location: [facility] ENDOSCOPY;  Service: Cardiovascular;  Laterality: N/A;  . CARDIOVERSION N/A 12/11/2016   Procedure: CARDIOVERSION;  Surgeon: Dorothy Spark, MD;  Location: [facility] ENDOSCOPY;  Service: Cardiovascular;  Laterality: N/A;  . CARDIOVERSION N/A 12/18/2016   Procedure: CARDIOVERSION;  Surgeon: Skeet Latch, MD;  Location: [place];  Service: Cardiovascular;  Laterality: N/A;  . CATARACT EXTRACTION W/ INTRAOCULAR LENS  IMPLANT, BILATERAL Bilateral   . DILATION AND CURETTAGE OF UTERUS    . LEXISCAN MYOVIEW  05/13/2012   No ECG changes. EKG negative for ischemia. No significant ischemia demonstrated.  [name] MELANOMA EXCISION Left ~ 09/1979   "back of my shoulder"  . TEE WITHOUT CARDIOVERSION N/A 11/19/2016   Procedure: TRANSESOPHAGEAL ECHOCARDIOGRAM (TEE);  Surgeon: Lelon Perla, MD;  Location: [facility] ENDOSCOPY;  Service: Cardiovascular;  Laterality: N/A;  need CV, but no anesthesia available  . TEE WITHOUT CARDIOVERSION N/A 01/10/2017   Procedure: TRANSESOPHAGEAL ECHOCARDIOGRAM (TEE);  Surgeon: Jenna Casino, MD;  Location: [facility] ENDOSCOPY;  Service: Cardiovascular;  Laterality: N/A;  . TOTAL KNEE ARTHROPLASTY Right 07/28/2018   Procedure: RIGHT TOTAL KNEE ARTHROPLASTY;  Surgeon: Mcarthur Rossetti, MD;  Location: [place];  Service: Orthopedics;  Laterality: Right;  . TRANSTHORACIC ECHOCARDIOGRAM  02/18/2012   EF >55%, mild aortic stenosis  . VAGINAL HYSTERECTOMY  1985     Current Meds  Medication Sig  . acetaminophen (TYLENOL) 500 MG tablet Take 500-1,000 mg by mouth every 6 (six) hours as needed for mild pain, moderate pain or headache.  . ALPRAZolam (XANAX) 0.5 MG tablet Take 0.5 mg by mouth 2 (two) times daily.   [name] atorvastatin (LIPITOR) 20 MG tablet Take 20 mg by mouth 3 (three) times a week.  [name]  buPROPion (WELLBUTRIN XL) 150 MG 24 hr tablet Take 300 mg by mouth daily.   . Calcium Carbonate-Vitamin D (CALCIUM + D PO) Take 1 tablet by mouth 2 (two) times daily.   . carvedilol (COREG) 12.5 MG tablet Take 1 tablet (12.5 mg total) by mouth 2 (two) times daily.  [name] ELIQUIS 5 MG TABS tablet TAKE 1 TABLET BY MOUTH TWICE A DAY  . furosemide (LASIX) 40 MG tablet Take 20 mg by mouth daily.   [name] levothyroxine (SYNTHROID, LEVOTHROID) 88 MCG tablet Take 88 mcg by mouth daily before breakfast.  . Magnesium 500 MG TABS Take 500 mg by mouth daily.   . Multiple Vitamins-Minerals (PRESERVISION AREDS 2) CAPS Take 1 capsule by mouth 2 (two) times daily.  [name] omeprazole (PRILOSEC) 40 MG capsule Take 40 mg by mouth daily.  Vladimir Faster Glycol-Propyl Glycol (SYSTANE) 0.4-0.3 % SOLN Place 2 drops into both eyes 4 (four) times daily.  [name] rOPINIRole (REQUIP) 0.5 MG tablet Take 0.5 mg by mouth  4 (four) times daily.   [name] spironolactone (ALDACTONE) 50 MG tablet Take 0.5 tablets by mouth daily.   . vitamin B-12 (CYANOCOBALAMIN) 1000 MCG tablet Take 1,000 mcg by mouth daily.     Allergies:   Amoxicillin, Crestor [rosuvastatin], and Lovastatin   Social History   Tobacco Use  . Smoking status: Never Smoker  . Smokeless tobacco: Never Used  Substance Use Topics  . Alcohol use: No  . Drug use: Never     Family Hx: The patient's family history includes Arrhythmia in her sister; Cancer in her paternal grandfather; Diabetes in her mother; Healthy in her brother and daughter; Heart failure in her father; Hyperlipidemia in her sister and son; Hypertension in her mother, sister, and son; Stroke in her paternal grandmother.  ROS:   Please see the history of present illness.     All other systems reviewed and are negative.   Prior CV studies:   The following studies were reviewed today:  Echo 08/09/19 1. Left ventricular ejection fraction, by visual estimation, is 60 to 65%. The left ventricle has normal function.  There is moderately increased left ventricular hypertrophy.  2. Left ventricular diastolic parameters are consistent with Grade I diastolic dysfunction (impaired relaxation).  3. Global right ventricle has normal systolic function.The right ventricular size is normal. No increase in right ventricular wall thickness.  4. Left atrial size was normal.  5. Right atrial size was normal.  6. The mitral valve is grossly normal. Trace mitral valve regurgitation.  7. The tricuspid valve is grossly normal. Tricuspid valve regurgitation is trivial.  8. Aortic valve area, by VTI measures 1.01 cm.  9. Aortic valve mean gradient measures 26.0 mmHg. 10. Aortic valve peak gradient measures 44.6 mmHg. 11. The aortic valve has an indeterminant number of cusps. Aortic valve regurgitation is not visualized. Moderate to severe aortic valve stenosis. 12. The pulmonic valve was grossly normal. Pulmonic valve regurgitation is not visualized. 13. The inferior vena cava is normal in size with greater than 50% respiratory variability, suggesting right atrial pressure of 3 mmHg. 14. The interatrial septum was not well visualized.  Labs/Other Tests and Data Reviewed:    EKG:  No ECG reviewed.  Recent Labs: 08/27/2019: BUN 34; Creatinine, Ser 1.28; Potassium 4.7; Sodium 137   Recent Lipid Panel Lab Results  Component Value Date/Time   CHOL 170 02/11/2017 09:41 AM   TRIG 93 02/11/2017 09:41 AM   HDL 82 02/11/2017 09:41 AM   CHOLHDL 2.1 02/11/2017 09:41 AM   LDLCALC 69 02/11/2017 09:41 AM    Wt Readings from Last 3 Encounters:  10/26/19 185 lb 12.8 oz (84.3 kg)  08/27/19 193 lb (87.5 kg)  08/19/19 192 lb (87.1 kg)     Objective:    Vital Signs:  BP 125/61   Pulse 67   Ht 5\' 5"  (1.651 m)   Wt 185 lb 12.8 oz (84.3 kg)   BMI 30.92 kg/m    VITAL SIGNS:  reviewed GEN:  no acute distress RESPIRATORY:  respirations unlabored, no cough NEURO:  alert and oriented x 3, no obvious focal deficit PSYCH:   normal affect  ASSESSMENT & PLAN:    Chronic diastolic heart failure She had been stable on 40 mg lasix. Due to rise in creatinine and potassium, her PCP reduced lasix to 20 mg, reduced spiro to 25 mg, and stopped potassium supplement.  Unclear what role AS progression may be playing in her dyspnea.    Moderate to severe aortic stenosis -  will repeat echo in May 2021   Parosyxmal atrial fibrillation Atrial tachcyardia - coreg previously increased to 12.5 mg BID  - continue eliquis for CHA2DS2-VASc Score and unadjusted Ischemic Stroke Rate (% per year) is equal to 7.2 % stroke rate/year from a score of 5 (female, age2, HTN, CHF)   Hypertension - pressure is well-controlled - she is concerned about hypotension with a BP of 116/56 - she was asymptomatic - no further medication changes   Will send messages to EP schedulers for her annual follow up.    COVID-19 Education: The signs and symptoms of COVID-19 were discussed with the patient and how to seek care for testing (follow up with PCP or arrange E-visit).  The importance of social distancing was discussed today.  Time:   Today, I have spent 16 minutes with the patient with telehealth technology discussing the above problems.     Medication Adjustments/Labs and Tests Ordered: Current medicines are reviewed at length with the patient today.  Concerns regarding medicines are outlined above.   Tests Ordered: No orders of the defined types were placed in this encounter.   Medication Changes: No orders of the defined types were placed in this encounter.   Follow Up:  Either In Person or Virtual follow up already scheduled with Jenna [name]  Signed, Tami Lin [place], [place]  10/26/2019 9:12 AM    [place]

## 2019-10-26 ENCOUNTER — Telehealth (INDEPENDENT_AMBULATORY_CARE_PROVIDER_SITE_OTHER): Payer: Medicare PPO | Admitting: Physician Assistant

## 2019-10-26 ENCOUNTER — Encounter: Payer: Self-pay | Admitting: Physician Assistant

## 2019-10-26 VITALS — BP 125/61 | HR 67 | Ht 65.0 in | Wt 185.8 lb

## 2019-10-26 DIAGNOSIS — I10 Essential (primary) hypertension: Secondary | ICD-10-CM

## 2019-10-26 DIAGNOSIS — I350 Nonrheumatic aortic (valve) stenosis: Secondary | ICD-10-CM

## 2019-10-26 DIAGNOSIS — I428 Other cardiomyopathies: Secondary | ICD-10-CM

## 2019-10-26 DIAGNOSIS — I5032 Chronic diastolic (congestive) heart failure: Secondary | ICD-10-CM

## 2019-10-26 DIAGNOSIS — I471 Supraventricular tachycardia: Secondary | ICD-10-CM

## 2019-10-26 DIAGNOSIS — I480 Paroxysmal atrial fibrillation: Secondary | ICD-10-CM

## 2019-10-26 NOTE — Patient Instructions (Addendum)
Medication Instructions:  The current medical regimen is effective;  continue present plan and medications as directed. Please refer to the Current Medication list given to you today. If you need a refill on your cardiac medications before your next appointment, please call your pharmacy.  Follow-Up:  keep scheduled appointment In Person K. Mali Hilty, MD.    Special Instructions: I have sent a messge for the scheduler to call you to schedule this appointment.  Please have Dr Melford Aase forward lab results. Our fax number is [phone]  Reduce your risk of getting COVID-19 With your heart disease it is especially important for people at increased risk of severe illness from COVID-19, and those who live with them, to protect themselves from getting COVID-19. The best way to protect yourself and to help reduce the spread of the virus that causes COVID-19 is to: [name] Limit your interactions with other people as much as possible. . Take precautions to prevent getting COVID-19 when you do interact with others. If you start feeling sick and think you may have COVID-19, get in touch with your healthcare provider within 24  At [facility], you and your health needs are our priority.  As part of our continuing mission to provide you with exceptional heart care, we have created designated Provider Care Teams.  These Care Teams include your primary Cardiologist (physician) and Advanced Practice Providers (APPs -  Physician Assistants and Nurse Practitioners) who all work together to provide you with the care you need, when you need it.  Thank you for choosing [facility] at [facility]!!

## 2019-10-28 DIAGNOSIS — G8929 Other chronic pain: Secondary | ICD-10-CM | POA: Insufficient documentation

## 2019-11-03 ENCOUNTER — Telehealth: Payer: Self-pay

## 2019-11-04 ENCOUNTER — Ambulatory Visit: Payer: Medicare PPO | Admitting: Pulmonary Disease

## 2019-11-04 ENCOUNTER — Encounter: Payer: Self-pay | Admitting: Pulmonary Disease

## 2019-11-04 ENCOUNTER — Other Ambulatory Visit: Payer: Self-pay

## 2019-11-04 DIAGNOSIS — G2581 Restless legs syndrome: Secondary | ICD-10-CM | POA: Diagnosis not present

## 2019-11-04 DIAGNOSIS — G4733 Obstructive sleep apnea (adult) (pediatric): Secondary | ICD-10-CM | POA: Diagnosis not present

## 2019-11-04 NOTE — Patient Instructions (Signed)
CPAP is working well Stay on requip

## 2019-11-04 NOTE — Progress Notes (Signed)
   Subjective:    Patient ID: Jenna [name], female    DOB: [birth year], 79 y.o.   MRN: [id number]  HPI  79 yo followed for obstructive sleep apnea and restless leg syndrome  PMH - A fibn  We got her new CPAP machine in 2018-she likes this, compliance is good on her machine. She goes through cycles where she sleeps well for a few weeks and then for a few weeks she has a hard time sleeping.  She uses nasal pillows She has restless leg for several years and uses 0.5 mg of Requip about 4 times a day, surprisingly she reports symptoms in the morning and also in the afternoon  Several family members tested positive for Covid, she felt weakness but tested negative.  She has vaccination scheduled in April  CPAP download report was reviewed -no residual events on 13 cm but over the last week, compliance has been poor, large leak  Significant tests/ events reviewed   NPSG 2005: AHI 17/hr, optimal pressure 11cm Auto 2014: Optimal pressure 13cm   Review of Systems Patient denies significant dyspnea,cough, hemoptysis,  chest pain, palpitations, pedal edema, orthopnea, paroxysmal nocturnal dyspnea, lightheadedness, nausea, vomiting, abdominal or  leg pains      Objective:   Physical Exam   Gen. Pleasant, well-nourished, elderly, in no distress ENT - no thrush, no pallor/icterus,no post nasal drip Neck: No JVD, no thyromegaly, no carotid bruits Lungs: no use of accessory muscles, no dullness to percussion, clear without rales or rhonchi  Cardiovascular: Rhythm regular, heart sounds  normal, no murmurs or gallops, no peripheral edema Musculoskeletal: No deformities, no cyanosis or clubbing         Assessment & Plan:

## 2019-11-04 NOTE — Assessment & Plan Note (Signed)
Surprisingly she has symptoms after waking up and in the early afternoon which is not classic for RLS. I asked her to cluster for Requip dosing about 1 to 1.5 mg towards evening which may help with her insomnia and try to take only 1 tablet in the daytime as needed

## 2019-11-04 NOTE — Assessment & Plan Note (Signed)
Continue CPAP 13 cm with nasal pillows  She is mostly compliant except when her sleep cycles are off  Weight loss encouraged, compliance with goal of at least 4-6 hrs every night is the expectation. Advised against medications with sedative side effects Cautioned against driving when sleepy - understanding that sleepiness will vary on a day to day basis

## 2019-11-08 ENCOUNTER — Encounter: Payer: Self-pay | Admitting: Internal Medicine

## 2019-11-08 ENCOUNTER — Telehealth (INDEPENDENT_AMBULATORY_CARE_PROVIDER_SITE_OTHER): Payer: Medicare PPO | Admitting: Internal Medicine

## 2019-11-08 ENCOUNTER — Telehealth: Payer: Self-pay

## 2019-11-08 VITALS — BP 125/66 | HR 77 | Ht 65.0 in | Wt 188.8 lb

## 2019-11-08 DIAGNOSIS — I4819 Other persistent atrial fibrillation: Secondary | ICD-10-CM

## 2019-11-08 DIAGNOSIS — I428 Other cardiomyopathies: Secondary | ICD-10-CM | POA: Diagnosis not present

## 2019-11-08 DIAGNOSIS — I10 Essential (primary) hypertension: Secondary | ICD-10-CM | POA: Diagnosis not present

## 2019-11-08 DIAGNOSIS — G4733 Obstructive sleep apnea (adult) (pediatric): Secondary | ICD-10-CM

## 2019-11-08 DIAGNOSIS — I350 Nonrheumatic aortic (valve) stenosis: Secondary | ICD-10-CM

## 2019-11-08 NOTE — Progress Notes (Signed)
Electrophysiology TeleHealth Note  Due to national recommendations of social distancing due to [place] 19, an audio telehealth visit is felt to be most appropriate for this patient at this time.  Verbal consent was obtained by me for the telehealth visit today.  The patient does not have capability for a virtual visit.  A phone visit is therefore required today.   Date:  11/08/2019   ID:  Jenna [name], Jenna [name] [birth year], MRN [id number]  Location: patient's home  Provider location:  [place]  Evaluation Performed: Follow-up visit  PCP:  Chesley Noon, MD   Electrophysiologist:  Dr Rayann Heman  Chief Complaint:  palpitations  History of Present Illness:    Jenna [name] is a 79 y.o. female who presents via telehealth conferencing today.  Since last being seen in our clinic, the patient reports doing very well.  She finds that hear heart rate is frequently up to 100 bpm.  She is not certain as to whether she has had afib or not.  She has had some SOB.  She thinks that she may have had COVID in December though she tested negative.  Her son had tested positive.  Today, she denies symptoms of palpitations, chest pain, ower extremity edema, dizziness, presyncope, or syncope.  The patient is otherwise without complaint today.  Past Medical History:  Diagnosis Date  . Anxiety   . CHF (congestive heart failure) ([place])   . Chronic lower back pain   . Chronic systolic dysfunction of left ventricle   . Depression   . Dysrhythmia    A-Fib  . Fatigue   . GERD (gastroesophageal reflux disease)   . High cholesterol   . Hypertension   . Hypothyroidism   . Melanoma of shoulder, left ([place])   . Mild aortic stenosis   . Nonischemic cardiomyopathy ([place])   . OSA on CPAP    reports compliance with CPAP  . Osteoarthritis   . Persistent atrial fibrillation with RVR ([place])    [name] 11/15/2016  . PONV (postoperative nausea and vomiting)   . RLS (restless legs syndrome)   . S/P ablation of  atrial flutter/ fib 01/10/17 01/11/2017  . Type II diabetes mellitus (HCC)    no medications    Past Surgical History:  Procedure Laterality Date  . ATRIAL FIBRILLATION ABLATION N/A 01/10/2017   Procedure: Atrial Fibrillation Ablation;  Surgeon: Thompson Grayer, MD;  Location: [place] CV LAB;  Service: Cardiovascular;  Laterality: N/A;  . CARDIOVERSION N/A 11/19/2016   Procedure: CARDIOVERSION;  Surgeon: Lelon Perla, MD;  Location: [facility] ENDOSCOPY;  Service: Cardiovascular;  Laterality: N/A;  . CARDIOVERSION N/A 12/11/2016   Procedure: CARDIOVERSION;  Surgeon: Dorothy Spark, MD;  Location: [facility] ENDOSCOPY;  Service: Cardiovascular;  Laterality: N/A;  . CARDIOVERSION N/A 12/18/2016   Procedure: CARDIOVERSION;  Surgeon: Skeet Latch, MD;  Location: [place];  Service: Cardiovascular;  Laterality: N/A;  . CATARACT EXTRACTION W/ INTRAOCULAR LENS  IMPLANT, BILATERAL Bilateral   . DILATION AND CURETTAGE OF UTERUS    . LEXISCAN MYOVIEW  05/13/2012   No ECG changes. EKG negative for ischemia. No significant ischemia demonstrated.  [name] MELANOMA EXCISION Left ~ 09/1979   "back of my shoulder"  . TEE WITHOUT CARDIOVERSION N/A 11/19/2016   Procedure: TRANSESOPHAGEAL ECHOCARDIOGRAM (TEE);  Surgeon: Lelon Perla, MD;  Location: [facility] ENDOSCOPY;  Service: Cardiovascular;  Laterality: N/A;  need CV, but no anesthesia available  . TEE WITHOUT CARDIOVERSION N/A 01/10/2017   Procedure: TRANSESOPHAGEAL ECHOCARDIOGRAM (  TEE);  Surgeon: [name], MD;  Location: [facility] ENDOSCOPY;  Service: Cardiovascular;  Laterality: N/A;  . TOTAL KNEE ARTHROPLASTY Right 07/28/2018   Procedure: RIGHT TOTAL KNEE ARTHROPLASTY;  Surgeon: Mcarthur Rossetti, MD;  Location: [place];  Service: Orthopedics;  Laterality: Right;  . TRANSTHORACIC ECHOCARDIOGRAM  02/18/2012   EF >55%, mild aortic stenosis  . VAGINAL HYSTERECTOMY  1985    Current Outpatient Medications  Medication Sig Dispense Refill  . acetaminophen (TYLENOL) 500  MG tablet Take 500-1,000 mg by mouth every 6 (six) hours as needed for mild pain, moderate pain or headache.    . ALPRAZolam (XANAX) 0.5 MG tablet Take 0.5 mg by mouth 2 (two) times daily.     [name] atorvastatin (LIPITOR) 20 MG tablet Take 20 mg by mouth 3 (three) times a week.    [name] buPROPion (WELLBUTRIN XL) 150 MG 24 hr tablet Take 300 mg by mouth daily.     . Calcium Carbonate-Vitamin D (CALCIUM + D PO) Take 1 tablet by mouth 2 (two) times daily.     . carvedilol (COREG) 12.5 MG tablet Take 1 tablet (12.5 mg total) by mouth 2 (two) times daily. 90 tablet 1  . ELIQUIS 5 MG TABS tablet TAKE 1 TABLET BY MOUTH TWICE A DAY 180 tablet 1  . furosemide (LASIX) 40 MG tablet Take 20 mg by mouth daily.     [name] KLOR-CON M20 20 MEQ tablet TAKE 2 TABLETS (40 MEQ TOTAL) BY MOUTH 2 (TWO) TIMES DAILY. 360 tablet 1  . levothyroxine (SYNTHROID, LEVOTHROID) 88 MCG tablet Take 88 mcg by mouth daily before breakfast.    . Magnesium 500 MG TABS Take 500 mg by mouth daily.     . Multiple Vitamins-Minerals (PRESERVISION AREDS 2) CAPS Take 1 capsule by mouth 2 (two) times daily.    [name] omeprazole (PRILOSEC) 40 MG capsule Take 40 mg by mouth daily.    Vladimir Faster Glycol-Propyl Glycol (SYSTANE) 0.4-0.3 % SOLN Place 2 drops into both eyes 4 (four) times daily.    [name] pyridoxine (B-6) 100 MG tablet Take 100 mg by mouth daily with supper.     [name] rOPINIRole (REQUIP) 0.5 MG tablet Take 0.5 mg by mouth 4 (four) times daily.     [name] spironolactone (ALDACTONE) 50 MG tablet Take 0.5 tablets by mouth daily.     . vitamin B-12 (CYANOCOBALAMIN) 1000 MCG tablet Take 1,000 mcg by mouth daily.     No current facility-administered medications for this visit.    Allergies:   Amoxicillin, Crestor [rosuvastatin], and Lovastatin   Social History:  The patient  reports that she has never smoked. She has never used smokeless tobacco. She reports that she does not drink alcohol or use drugs.   Family History:  The patient's family history includes  Arrhythmia in her sister; Cancer in her paternal grandfather; Diabetes in her mother; Healthy in her brother and daughter; Heart failure in her father; Hyperlipidemia in her sister and son; Hypertension in her mother, sister, and son; Stroke in her paternal grandmother.   ROS:  Please see the history of present illness.   All other systems are personally reviewed and negative.    Exam:    Vital Signs:  BP 125/66   Pulse 77   Ht 5\' 5"  (1.651 m)   Wt 188 lb 12.8 oz (85.6 kg)   BMI 31.42 kg/m   Well sounding, alert and conversant   Labs/Other Tests and Data Reviewed:    Recent Labs: 08/27/2019:  BUN 34; Creatinine, Ser 1.28; Potassium 4.7; Sodium 137   Wt Readings from Last 3 Encounters:  11/08/19 188 lb 12.8 oz (85.6 kg)  11/04/19 195 lb 6.6 oz (88.6 kg)  10/26/19 185 lb 12.8 oz (84.3 kg)     ASSESSMENT & PLAN:    1.  Persistent atrial fibrillation She has done very well post ablation She has occasional heart rates of up to 100 bp with associated fatigue. She thinks that this could be afib, though we have not well documented this.  She has previously worn event monitors.  She did not tolerate wearing monitors very well. I discussed ILR implantation at length today.  I would advise long term monitoring for further evaluation of her palpitations and for afib management post ablation.  She understands that risks include but are not limited to bleeding and infection and wishes to proceed.  We will schedule ILR at the next available time. chads2vasc score is 5.  She is on eliquis  2. HTN Stable No change required today  3. OSA  Compliance with CPAP encouraged  4. Nonischemic CM (tachycardia mediated) EF has normalized with sinus  5. Moderate to severe AS Possibly the cause of her SOB We discussed at length today   Patient Risk:  after full review of this patients clinical status, I feel that they are at moderate risk at this time.  Today, I have spent 15 minutes with the  patient with telehealth technology discussing arrhythmia management .    Army Fossa, MD  11/08/2019 9:47 AM     [facility] [address] [place] [address] [phone] (office) [phone] (fax)

## 2019-11-08 NOTE — Patient Instructions (Signed)
Medication Instructions: Your physician recommends that you continue on your current medications as directed. Please refer to the Current Medication list given to you today.  *If you need a refill on your cardiac medications before your next appointment, please call your pharmacy*  Lab Work: None ordered.  If you have labs (blood work) drawn today and your tests are completely normal, you will receive your results only by: [name] MyChart Message (if you have MyChart) OR . A paper copy in the mail If you have any lab test that is abnormal or we need to change your treatment, we will call you to review the results.  Testing/Procedures: You are scheduled for an Internal Loop Recorder Implant on 11/25/2019 at 930am with Dr Rayann Heman.    Follow-Up: At [facility], you and your health needs are our priority.  As part of our continuing mission to provide you with exceptional heart care, we have created designated Provider Care Teams.  These Care Teams include your primary Cardiologist (physician) and Advanced Practice Providers (APPs -  Physician Assistants and Nurse Practitioners) who all work together to provide you with the care you need, when you need it.

## 2019-11-08 NOTE — Telephone Encounter (Signed)
Phone call to pt re: appointment for Internal Loop Recorder Implant.  Left voicemail message to return call to Dr Jackalyn Lombard nurse, Myrtie Hawk at [phone].

## 2019-11-09 NOTE — Telephone Encounter (Signed)
Patient returning call for Sonia Baller.

## 2019-11-09 NOTE — Telephone Encounter (Signed)
Call placed to Pt.  Confirmed loop placement office visit.  All questions answered.

## 2019-11-25 ENCOUNTER — Ambulatory Visit: Payer: Medicare PPO | Admitting: Internal Medicine

## 2019-12-06 ENCOUNTER — Telehealth: Payer: Self-pay | Admitting: Internal Medicine

## 2019-12-06 ENCOUNTER — Ambulatory Visit: Payer: Medicare PPO | Attending: Internal Medicine

## 2019-12-06 DIAGNOSIS — Z23 Encounter for immunization: Secondary | ICD-10-CM

## 2019-12-06 NOTE — Progress Notes (Signed)
   Covid-19 Vaccination Clinic  Name:  Jenna [name]    MRN: [id number] DOB: 13-Mar-1941  12/06/2019  [name] was observed post Covid-19 immunization for 15 minutes without incidence. She was provided with Vaccine Information Sheet and instruction to access the V-Safe system.   [name] was instructed to call 911 with any severe reactions post vaccine: [name] Difficulty breathing  . Swelling of your face and throat  . A fast heartbeat  . A bad rash all over your body  . Dizziness and weakness    Immunizations Administered    Name Date Dose VIS Date Route   Pfizer COVID-19 Vaccine 12/06/2019 11:08 AM 0.3 mL 09/17/2019 Intramuscular   Manufacturer: [place]   Lot: [id number]   [place]: [id number]

## 2019-12-06 NOTE — Telephone Encounter (Signed)
   Jenna [name] from [place] with the Pt called and requesting to extend the authorization for pt's loop implant. It is good for 11/25/2019 to 12/25/2019. However her appt with Dr. Rayann Heman is on 12/27/2019. Jenna [name] ask Dr. Rayann Heman to extend the auth date to make sure that procedure is covered. He said to contact health help [phone].  Please advise

## 2019-12-07 DIAGNOSIS — M47816 Spondylosis without myelopathy or radiculopathy, lumbar region: Secondary | ICD-10-CM | POA: Insufficient documentation

## 2019-12-15 ENCOUNTER — Other Ambulatory Visit: Payer: Self-pay | Admitting: Internal Medicine

## 2019-12-23 ENCOUNTER — Telehealth: Payer: Self-pay

## 2019-12-23 NOTE — Telephone Encounter (Signed)
Call returned to Pt per her request.  Pt wanted to know if she should take her blood thinner on Monday prior to loop implant.  Advised she should.  Pt had a few questions about the loop implant.  Answered all her questions.  She is ready for her procedure.

## 2019-12-27 ENCOUNTER — Encounter: Payer: Self-pay | Admitting: Internal Medicine

## 2019-12-27 ENCOUNTER — Ambulatory Visit: Payer: Medicare PPO | Admitting: Internal Medicine

## 2019-12-27 ENCOUNTER — Other Ambulatory Visit: Payer: Self-pay

## 2019-12-27 VITALS — BP 108/68 | HR 61 | Ht 65.5 in | Wt 190.2 lb

## 2019-12-27 DIAGNOSIS — I10 Essential (primary) hypertension: Secondary | ICD-10-CM | POA: Diagnosis not present

## 2019-12-27 DIAGNOSIS — D6869 Other thrombophilia: Secondary | ICD-10-CM

## 2019-12-27 DIAGNOSIS — G4733 Obstructive sleep apnea (adult) (pediatric): Secondary | ICD-10-CM | POA: Diagnosis not present

## 2019-12-27 DIAGNOSIS — I4819 Other persistent atrial fibrillation: Secondary | ICD-10-CM

## 2019-12-27 HISTORY — PX: OTHER SURGICAL HISTORY: SHX169

## 2019-12-27 NOTE — Progress Notes (Signed)
PCP: Chesley Noon, MD   Primary EP: Dr Erskine Speed Jenna [name] is a 79 y.o. female who presents today for routine electrophysiology followup.  Since last being seen in our clinic, the patient reports doing very well.  She has palpitations of unclear significance. Today, she denies symptoms of  chest pain, shortness of breath,  lower extremity edema, dizziness, presyncope, or syncope.  The patient is otherwise without complaint today.   Past Medical History:  Diagnosis Date  . Anxiety   . CHF (congestive heart failure) ([place])   . Chronic lower back pain   . Chronic systolic dysfunction of left ventricle   . Depression   . Dysrhythmia    A-Fib  . Fatigue   . GERD (gastroesophageal reflux disease)   . High cholesterol   . Hypertension   . Hypothyroidism   . Melanoma of shoulder, left ([place])   . Mild aortic stenosis   . Nonischemic cardiomyopathy ([place])   . OSA on CPAP    reports compliance with CPAP  . Osteoarthritis   . Persistent atrial fibrillation with RVR ([place])    [name] 11/15/2016  . PONV (postoperative nausea and vomiting)   . RLS (restless legs syndrome)   . S/P ablation of atrial flutter/ fib 01/10/17 01/11/2017  . Type II diabetes mellitus (HCC)    no medications   Past Surgical History:  Procedure Laterality Date  . ATRIAL FIBRILLATION ABLATION N/A 01/10/2017   Procedure: Atrial Fibrillation Ablation;  Surgeon: Thompson Grayer, MD;  Location: [place] [facility] LAB;  Service: Cardiovascular;  Laterality: N/A;  . CARDIOVERSION N/A 11/19/2016   Procedure: CARDIOVERSION;  Surgeon: Lelon Perla, MD;  Location[facility] ENDOSCOPY;  Service: Cardiovascular;  Laterality: N/A;  . CARDIOVERSION N/A 12/11/2016   Procedure: CARDIOVERSION;  Surgeon: Dorothy Spark, MD;  Location: [facility] ENDOSCOPY;  Service: Cardiovascular;  Laterality: N/A;  . CARDIOVERSION N/A 12/18/2016   Procedure: CARDIOVERSION;  Surgeon: Skeet Latch, MD;  Location: [place];  Service: Cardiovascular;  Laterality:  N/A;  . CATARACT EXTRACTION W/ INTRAOCULAR LENS  IMPLANT, BILATERAL Bilateral   . DILATION AND CURETTAGE OF UTERUS    . LEXISCAN MYOVIEW  05/13/2012   No ECG changes. EKG negative for ischemia. No significant ischemia demonstrated.  [name] MELANOMA EXCISION Left ~ 09/1979   "back of my shoulder"  . TEE WITHOUT CARDIOVERSION N/A 11/19/2016   Procedure: TRANSESOPHAGEAL ECHOCARDIOGRAM (TEE);  Surgeon: Lelon Perla, MD;  Location: [facility] ENDOSCOPY;  Service: Cardiovascular;  Laterality: N/A;  need CV, but no anesthesia available  . TEE WITHOUT CARDIOVERSION N/A 01/10/2017   Procedure: TRANSESOPHAGEAL ECHOCARDIOGRAM (TEE);  Surgeon: [name], MD;  Location: [facility] ENDOSCOPY;  Service: Cardiovascular;  Laterality: N/A;  . TOTAL KNEE ARTHROPLASTY Right 07/28/2018   Procedure: RIGHT TOTAL KNEE ARTHROPLASTY;  Surgeon: Mcarthur Rossetti, MD;  Location: [place];  Service: Orthopedics;  Laterality: Right;  . TRANSTHORACIC ECHOCARDIOGRAM  02/18/2012   EF >55%, mild aortic stenosis  . VAGINAL HYSTERECTOMY  1985    ROS- all systems are reviewed and negatives except as per HPI above  Current Outpatient Medications  Medication Sig Dispense Refill  . acetaminophen (TYLENOL) 500 MG tablet Take 500-1,000 mg by mouth every 6 (six) hours as needed for mild pain, moderate pain or headache.    . ALPRAZolam (XANAX) 0.5 MG tablet Take 0.5 mg by mouth 2 (two) times daily.     [name] atorvastatin (LIPITOR) 20 MG tablet Take 1 tablet (20 mg total) by mouth 3 (  three) times a week. 36 tablet 3  . buPROPion (WELLBUTRIN XL) 150 MG 24 hr tablet Take 300 mg by mouth daily.     . Calcium Carbonate-Vitamin D (CALCIUM + D PO) Take 1 tablet by mouth 2 (two) times daily.     . carvedilol (COREG) 12.5 MG tablet Take 1 tablet (12.5 mg total) by mouth 2 (two) times daily. 90 tablet 1  . ELIQUIS 5 MG TABS tablet TAKE 1 TABLET BY MOUTH TWICE A DAY 180 tablet 1  . furosemide (LASIX) 40 MG tablet Take 40 mg by mouth daily. Use as directed     . KLOR-CON M20 20 MEQ tablet TAKE 2 TABLETS (40 MEQ TOTAL) BY MOUTH 2 (TWO) TIMES DAILY. (Patient taking differently: Take 20 mEq by mouth 2 (two) times daily. Use as directed) 360 tablet 1  . levothyroxine (SYNTHROID, LEVOTHROID) 88 MCG tablet Take 88 mcg by mouth daily before breakfast.    . Magnesium 500 MG TABS Take 500 mg by mouth daily.     . Multiple Vitamins-Minerals (PRESERVISION AREDS 2) CAPS Take 1 capsule by mouth 2 (two) times daily.    [name] omeprazole (PRILOSEC) 40 MG capsule Take 40 mg by mouth daily.    Vladimir Faster Glycol-Propyl Glycol (SYSTANE) 0.4-0.3 % SOLN Place 2 drops into both eyes 4 (four) times daily.    [name] pyridoxine (B-6) 100 MG tablet Take 100 mg by mouth daily with supper.     [name] rOPINIRole (REQUIP) 0.5 MG tablet Take 0.5 mg by mouth 4 (four) times daily.     [name] spironolactone (ALDACTONE) 50 MG tablet Take 0.5 tablets by mouth daily.     . vitamin B-12 (CYANOCOBALAMIN) 1000 MCG tablet Take 1,000 mcg by mouth daily.     No current facility-administered medications for this visit.    Physical Exam: Vitals:   12/27/19 0943  BP: 108/68  Pulse: 61  SpO2: 98%  Weight: 190 lb 3.2 oz (86.3 kg)  Height: 5' 5.5" (1.664 m)    GEN- The patient is well appearing, alert and oriented x 3 today.   Head- normocephalic, atraumatic Eyes-  Sclera clear, conjunctiva pink Ears- hearing intact Oropharynx- clear Lungs-   normal work of breathing Heart- Regular rate and rhythm  Extremities- no clubbing, cyanosis, or edema  Wt Readings from Last 3 Encounters:  12/27/19 190 lb 3.2 oz (86.3 kg)  11/08/19 188 lb 12.8 oz (85.6 kg)  11/04/19 195 lb 6.6 oz (88.6 kg)    EKG tracing ordered today is personally reviewed and shows sinus rhythm 61 bpm, PR 182 msec, Qtc 374 msec  Assessment and Plan:  1. Persistent atrial fibrillation She has done well post ablation but has palpitations of unclear significance post ablation.  She worries this could be due to afib.   I would therefore  advise implantation of an implantable loop recorder for long term arrhythmia monitoring.  Risks and benefits to ILR were discussed at length with the patient today, including but not limited to risks of bleeding and infection.  Extensive device education was performed.  Remote monitoring was also discussed at length today.  The patient understands and wishes to proceed.  We will proceed at this time with ILR implantation.  2. HTN Stable No change required today  3. OSA Compliant with CPAP  4. Nonischemic CM EF has normalized post ablation  5. Moderate to severe AS Continue to follow  Follow-up in AF clinic in 3 months I will see in 6 months  Jeneen Rinks  Aloha Bartok MD, [facility] 12/27/2019 10:04 AM      DESCRIPTION OF PROCEDURE:  Informed written consent was obtained.  The patient required no sedation for the procedure today.  The patients left chest was prepped and draped. Mapping over the patient's chest was performed to identify the appropriate ILR site.  This area was found to be the left  parasternal region over the 3rd-4th intercostal space.  The skin overlying this region was infiltrated with lidocaine for local analgesia.  A 0.5-cm incision was made at the implant site.  A subcutaneous ILR pocket was fashioned using a combination of sharp and blunt dissection.  A Medtronic Reveal Linq model LNQ 11 (SN [id number]) implantable loop recorder was then placed into the pocket R waves were very prominent and measured > 0.2 mV. EBL<1 ml.  Steri- Strips and a sterile dressing were then applied.  There were no early apparent complications.     CONCLUSIONS:   1. Successful implantation of a Medtronic Reveal LINQ implantable loop recorder for afib management post ablation and to further evaluate her palpitations.  2. No early apparent complications.   Thompson Grayer MD, [facility] 12/27/2019 10:04 AM

## 2019-12-27 NOTE — Patient Instructions (Signed)
Medication Instructions:  Your physician recommends that you continue on your current medications as directed. Please refer to the Current Medication list given to you today.  *If you need a refill on your cardiac medications before your next appointment, please call your pharmacy*   Lab Work: None ordered If you have labs (blood work) drawn today and your tests are completely normal, you will receive your results only by: [name] MyChart Message (if you have MyChart) OR . A paper copy in the mail If you have any lab test that is abnormal or we need to change your treatment, we will call you to review the results.   Testing/Procedures: None ordered   Follow-Up: At [facility], you and your health needs are our priority.  As part of our continuing mission to provide you with exceptional heart care, we have created designated Provider Care Teams.  These Care Teams include your primary Cardiologist (physician) and Advanced Practice Providers (APPs -  Physician Assistants and Nurse Practitioners) who all work together to provide you with the care you need, when you need it.  Your next appointment:   3 month(s)  The format for your next appointment:   In Person  Provider:   AFib clinic   Your physician wants you to follow-up in: 6 months with Dr. Rayann Heman. You will receive a reminder letter in the mail two months in advance. If you don't receive a letter, please call our office to schedule the follow-up appointment.  Thank you for choosing [facility]!!   Trinidad Curet, RN [phone]    Other Instructions   Implantable Loop Recorder Placement  An implantable loop recorder is a small electronic device that is placed under the skin of your chest. It is about the size of an AA ("double A") battery. The device records the electrical activity of your heart over a long period of time. Your health care provider can download these recordings to monitor your heart. You may need an  implantable loop recorder if you have periods of abnormal heart activity (arrhythmias) or unexplained fainting (syncope). The recorder can be left in place for 1 year or longer. Tell a health care provider about:  Any allergies you have.  All medicines you are taking, including vitamins, herbs, eye drops, creams, and over-the-counter medicines.  Any problems you or family members have had with anesthetic medicines.  Any blood disorders you have.  Any surgeries you have had.  Any medical conditions you have.  Whether you are pregnant or may be pregnant. What are the risks? Generally, this is a safe procedure. However, problems may occur, including:  Infection.  Bleeding.  Allergic reactions to anesthetic medicines.  Damage to nerves or blood vessels.  Failure of the device to work. This could require another surgery to replace it. What happens before the procedure?   You may have a physical exam, blood tests, and imaging tests of your heart, such as a chest X-ray.  Follow instructions from your health care provider about eating or drinking restrictions.  Ask your health care provider about: ? Changing or stopping your regular medicines. This is especially important if you are taking diabetes medicines or blood thinners. ? Taking medicines such as aspirin and ibuprofen. These medicines can thin your blood. Do not take these medicines unless your health care provider tells you to take them. ? Taking over-the-counter medicines, vitamins, herbs, and supplements.  Ask your health care provider how your surgical site will be marked or identified.  Ask your health care provider what steps will be taken to help prevent infection. These may include: ? Removing hair at the surgery site. ? Washing skin with a germ-killing soap.  Plan to have someone take you home from the hospital or clinic.  Plan to have a responsible adult care for you for at least 24 hours after you leave the  hospital or clinic. This is important.  Do not use any products that contain nicotine or tobacco, such as cigarettes and e-cigarettes. If you need help quitting, ask your health care provider. What happens during the procedure?  An IV will be inserted into one of your veins.  You may be given one or more of the following: ? A medicine to help you relax (sedative). ? A medicine to numb the area (local anesthetic).  A small incision will be made on the left side of your upper chest.  A pocket will be created under your skin.  The device will be placed in the pocket.  The incision will be closed with stitches (sutures) or adhesive strips.  A bandage (dressing) will be placed over the incision. The procedure may vary among health care providers and hospitals. What happens after the procedure?  Your blood pressure, heart rate, breathing rate, and blood oxygen level will be monitored until you leave the hospital or clinic.  You may be able to go home on the day of your surgery. Before you go home: ? Your health care provider will program your recorder. ? You will learn how to trigger your device with a handheld activator. ? You will learn how to send recordings to your health care provider. ? You will get an ID card for your device, and you will be told when to use it.  Do not drive for 24 hours if you were given a sedative during your procedure. Summary  An implantable loop recorder is a small electronic device that is placed under the skin of your chest to monitor your heart over a long period of time.  The recorder can be left in place for 1 year or longer.  Plan to have someone take you home from the hospital or clinic. This information is not intended to replace advice given to you by your health care provider. Make sure you discuss any questions you have with your health care provider. Document Revised: 11/27/2017 Document Reviewed: 11/08/2017 Elsevier Patient Education   2020 [employer].

## 2019-12-29 ENCOUNTER — Encounter: Payer: Self-pay | Admitting: Internal Medicine

## 2020-01-04 ENCOUNTER — Ambulatory Visit: Payer: Medicare PPO | Attending: Internal Medicine

## 2020-01-04 DIAGNOSIS — Z23 Encounter for immunization: Secondary | ICD-10-CM

## 2020-01-04 NOTE — Progress Notes (Signed)
   Covid-19 Vaccination Clinic  Name:  Jenna [name]    MRN: [id number] DOB: 16-Jul-1941  01/04/2020  Jenna [name] was observed post Covid-19 immunization for 15 minutes without incident. She was provided with Vaccine Information Sheet and instruction to access the V-Safe system.   Jenna [name] was instructed to call 911 with any severe reactions post vaccine: [name] Difficulty breathing  . Swelling of face and throat  . A fast heartbeat  . A bad rash all over body  . Dizziness and weakness   Immunizations Administered    Name Date Dose VIS Date Route   Pfizer COVID-19 Vaccine 01/04/2020 11:39 AM 0.3 mL 09/17/2019 Intramuscular   Manufacturer: [place]   Lot: [id number]   [place]: [id number]

## 2020-01-10 ENCOUNTER — Other Ambulatory Visit: Payer: Self-pay

## 2020-01-10 ENCOUNTER — Ambulatory Visit: Payer: Medicare PPO | Admitting: Internal Medicine

## 2020-01-10 ENCOUNTER — Encounter: Payer: Self-pay | Admitting: Internal Medicine

## 2020-01-10 VITALS — BP 124/62 | HR 69 | Temp 96.3°F | Ht 65.5 in | Wt 191.0 lb

## 2020-01-10 DIAGNOSIS — I5032 Chronic diastolic (congestive) heart failure: Secondary | ICD-10-CM

## 2020-01-10 DIAGNOSIS — Z8679 Personal history of other diseases of the circulatory system: Secondary | ICD-10-CM | POA: Diagnosis not present

## 2020-01-10 DIAGNOSIS — I350 Nonrheumatic aortic (valve) stenosis: Secondary | ICD-10-CM | POA: Diagnosis not present

## 2020-01-10 DIAGNOSIS — Z9889 Other specified postprocedural states: Secondary | ICD-10-CM | POA: Diagnosis not present

## 2020-01-10 MED ORDER — POTASSIUM CHLORIDE CRYS ER 20 MEQ PO TBCR: 40.0000 meq | EXTENDED_RELEASE_TABLET | ORAL | 3 refills | Status: DC

## 2020-01-10 MED ORDER — APIXABAN 5 MG PO TABS: 5.0000 mg | ORAL_TABLET | Freq: Two times a day (BID) | ORAL | 1 refills | Status: DC

## 2020-01-10 NOTE — Patient Instructions (Signed)
Medication Instructions:  Your physician recommends that you continue on your current medications as directed. Please refer to the Current Medication list given to you today.  *If you need a refill on your cardiac medications before your next appointment, please call your pharmacy*  Testing/Procedures: Echocardiogram as scheduled on May 4   Follow-Up: At [facility], you and your health needs are our priority.  As part of our continuing mission to provide you with exceptional heart care, we have created designated Provider Care Teams.  These Care Teams include your primary Cardiologist (physician) and Advanced Practice Providers (APPs -  Physician Assistants and Nurse Practitioners) who all work together to provide you with the care you need, when you need it.  We recommend signing up for the patient portal called "MyChart".  Sign up information is provided on this After Visit Summary.  MyChart is used to connect with patients for Virtual Visits (Telemedicine).  Patients are able to view lab/test results, encounter notes, upcoming appointments, etc.  Non-urgent messages can be sent to your provider as well.   To learn more about what you can do with MyChart, go to [email or web address].    Your next appointment:   6 month(s)  The format for your next appointment:   In Person  Provider:   You may see [name], MD or one of the following Advanced Practice Providers on your designated Care Team:    Almyra Deforest, PA-C  Fabian Sharp, PA-C or   Roby Lofts, [place]    Other Instructions

## 2020-01-10 NOTE — Progress Notes (Signed)
OFFICE NOTE  Chief Complaint:   Shortness of breath  Primary Care Physician: Chesley Noon, MD  HPI:  Jenna [name] is a 79 year old female with a history of mild aortic stenosis and a negative nuclear stress test in August 2013. Recently she had an episode of palpitations which she felt lasted up to about 10 minutes. During that time she felt a decrease in exercise tolerance with fatigue and no energy. This was concerning for AFib; however, she wore a monitor for a short period of time and it did not show that.  I set her up for a 30-day monitor, which she worse very faithfully between February 26 and December 31, 2012. The results indicated 1 very brief episode of AFib of less than 10 seconds in duration out of a total of over 40,000 seconds of monitoring. This correlates with a burden of less than 0.003%. At this point, given her low burden I feel that it is still reasonable to treat her with low-dose aspirin. However, we will continue to see her closely and adjust her anticoagulation as necessary. Since her last follow-up she denies any significant palpitations. She has reported some LE swelling, which she thinks is better now that she is off of her ARB?  [name] returns today for followup. She reports doing fairly well. In August she said she had an episode of age of fibrillation when coming home from the beach. That day she walked up and down the stairs several times and felt her heart racing. It never seemed to slow down. Finally she felt better the next day however she was fatigued and eventually recovered. Since that time she's had no further events. She does get some mild swelling in her legs. She is reporting some fatigue since starting a beta blocker and feels that it may be a side effect.  I saw [name] back today in the office. She seems to doing fairly well from a cardiac standpoint. She denies any significant palpitations and thinks that the diltiazem is generally controlled  them. If she is having A. fib is very infrequent and a low burden. She is maintained only on aspirin. Unfortunate she's been struggling with depression which occurred after grieving for the death of her husband. She's been apparently on several different antidepressive medicines with mixed results and recently on Wellbutrin which she thinks is somewhat helpful. She did have somewhat of a flat affect in the office today.  07/08/2016  [name] returns today for follow-up. She is doing generally well and denies any palpitations of significance. EKG today shows sinus rhythm at 72. Her blood pressure is mildly elevated 141/79. Weight is up a few pounds. She continues to struggle with depression related to the death of her husband which is now a couple of years ago.  05/13/2017  [name] returns today for follow-up. Recently she was hospitalized in March with A. fib with RVR and symptoms of heart failure. Her diuretics were increased and she underwent cardioversion. This was initially successful, however ultimately she went back into A. fib. She was seen by EP and was placed on amiodarone and Ranexa. She was later evaluated and deemed a candidate for ablation. I performed a cardiac transesophageal echocardiogram which demonstrated an LVEF of 20-25% and no evidence of LV thrombus. Subsequently she underwent catheter ablation with successful conversion to sinus rhythm. Since that time she is maintaining sinus rhythm and a repeat echocardiogram on 04/22/2017 demonstrates normalization of LV function to an LVEF  of 60-65%. She reports that she's been feeling well. She's been worked with her primary care provider who recently obtained labs and indicated she may be a little dehydrated. She was on high-dose Lasix 80 mg daily daily which was reduced to 40 mg alternating with 80 mg every other day. She denies any worsening shortness of breath or chest pain. Blood pressure is at goal today. Her weight is been stable. She is  compliant with Eliquis for anticoagulation and denies any bleeding problems.  06/22/2018  [name] returns today for follow-up.  She reports some recent increase in swelling.  She denies any recurrent A. fib.  She was seen in the A. fib clinic in July and had not noted to have any A. fib at the time.  Fortunately her LVEF had normalized after ablation.  She denies any chest pain.  She continues to struggle with hip pain.  She says she has arthritis which is "bone-on-bone".  01/10/2020  [name] returns today for follow-up.  She has been followed in the A. fib clinic and has had good control of her A. fib after ablation however more recently had worsening palpitations.  She saw Dr. Rayann Heman and just had an implanted loop recorder placed a week ago.  Is a remote pacer check later this month.  In November she had a repeat echocardiogram which showed moderate to severe aortic stenosis with normal LV function.  She does report dyspnea on exertion, particularly walking upstairs or going long distances.  This seems to have worsened somewhat.  Another echo has been ordered for early May 2021.  EKG today shows sinus rhythm at 69.  PMHx:  Past Medical History:  Diagnosis Date  . Anxiety   . CHF (congestive heart failure) ([place])   . Chronic lower back pain   . Chronic systolic dysfunction of left ventricle   . Depression   . Dysrhythmia    A-Fib  . Fatigue   . GERD (gastroesophageal reflux disease)   . High cholesterol   . Hypertension   . Hypothyroidism   . Melanoma of shoulder, left ([place])   . Mild aortic stenosis   . Nonischemic cardiomyopathy ([place])   . OSA on CPAP    reports compliance with CPAP  . Osteoarthritis   . Persistent atrial fibrillation with RVR ([place])    [name] 11/15/2016  . PONV (postoperative nausea and vomiting)   . RLS (restless legs syndrome)   . S/P ablation of atrial flutter/ fib 01/10/17 01/11/2017  . Type II diabetes mellitus (HCC)    no medications    Past Surgical History:   Procedure Laterality Date  . ATRIAL FIBRILLATION ABLATION N/A 01/10/2017   Procedure: Atrial Fibrillation Ablation;  Surgeon: Thompson Grayer, MD;  Location: [facility];  Service: Cardiovascular;  Laterality: N/A;  . CARDIOVERSION N/A 11/19/2016   Procedure: CARDIOVERSION;  Surgeon: Lelon Perla, MD;  Location[facility] ENDOSCOPY;  Service: Cardiovascular;  Laterality: N/A;  . CARDIOVERSION N/A 12/11/2016   Procedure: CARDIOVERSION;  Surgeon: Dorothy Spark, MD;  Location: [facility] ENDOSCOPY;  Service: Cardiovascular;  Laterality: N/A;  . CARDIOVERSION N/A 12/18/2016   Procedure: CARDIOVERSION;  Surgeon: Skeet Latch, MD;  Location: [facility] ENDOSCOPY;  Service: Cardiovascular;  Laterality: N/A;  . CATARACT EXTRACTION W/ INTRAOCULAR LENS  IMPLANT, BILATERAL Bilateral   . DILATION AND CURETTAGE OF UTERUS    . implantable loop recorder placement  12/27/2019   Medtronic Reveal Linq model LNQ 40 (SN RLA [id number]) implanted in office by Dr Rayann Heman  .  LEXISCAN MYOVIEW  05/13/2012   No ECG changes. EKG negative for ischemia. No significant ischemia demonstrated.  [name] MELANOMA EXCISION Left ~ 09/1979   "back of my shoulder"  . TEE WITHOUT CARDIOVERSION N/A 11/19/2016   Procedure: TRANSESOPHAGEAL ECHOCARDIOGRAM (TEE);  Surgeon: Lelon Perla, MD;  Location: [facility] ENDOSCOPY;  Service: Cardiovascular;  Laterality: N/A;  need CV, but no anesthesia available  . TEE WITHOUT CARDIOVERSION N/A 01/10/2017   Procedure: TRANSESOPHAGEAL ECHOCARDIOGRAM (TEE);  Surgeon: [name], MD;  Location: [facility] ENDOSCOPY;  Service: Cardiovascular;  Laterality: N/A;  . TOTAL KNEE ARTHROPLASTY Right 07/28/2018   Procedure: RIGHT TOTAL KNEE ARTHROPLASTY;  Surgeon: Mcarthur Rossetti, MD;  Location: [facility];  Service: Orthopedics;  Laterality: Right;  . TRANSTHORACIC ECHOCARDIOGRAM  02/18/2012   EF >55%, mild aortic stenosis  . VAGINAL HYSTERECTOMY  1985    FAMHx:  Family History  Problem Relation Age of Onset  . Hypertension  Mother   . Diabetes Mother   . Heart failure Father   . Arrhythmia Sister   . Hyperlipidemia Sister   . Hypertension Sister   . Healthy Brother   . Stroke Paternal Grandmother   . Cancer Paternal Grandfather   . Healthy Daughter   . Hypertension Son   . Hyperlipidemia Son     SOCHx:   reports that she has never smoked. She has never used smokeless tobacco. She reports that she does not drink alcohol or use drugs.  ALLERGIES:  Allergies  Allergen Reactions  . Amoxicillin Nausea Only  . Crestor [Rosuvastatin] Other (See Comments)    Joints ached  . Lovastatin Other (See Comments)    Joints hurt all over body     ROS: Pertinent items noted in HPI and remainder of comprehensive ROS otherwise negative.  HOME MEDS: Current Outpatient Medications  Medication Sig Dispense Refill  . acetaminophen (TYLENOL) 500 MG tablet Take 500-1,000 mg by mouth daily.     [name] ALPRAZolam (XANAX) 0.5 MG tablet Take 0.5 mg by mouth 2 (two) times daily.     [name] atorvastatin (LIPITOR) 20 MG tablet Take 1 tablet (20 mg total) by mouth 3 (three) times a week. 36 tablet 3  . buPROPion (WELLBUTRIN XL) 150 MG 24 hr tablet Take 300 mg by mouth daily.     . Calcium Carbonate-Vitamin D (CALCIUM + D PO) Take 1 tablet by mouth 2 (two) times daily.     . carvedilol (COREG) 12.5 MG tablet Take 1 tablet (12.5 mg total) by mouth 2 (two) times daily. 90 tablet 1  . ELIQUIS 5 MG TABS tablet TAKE 1 TABLET BY MOUTH TWICE A DAY 180 tablet 1  . furosemide (LASIX) 20 MG tablet Take 20 mg by mouth every other day. Alternating with the 40 MG.    . furosemide (LASIX) 40 MG tablet Take 40 mg by mouth every other day. Use as directed    . KLOR-CON M20 20 MEQ tablet TAKE 2 TABLETS (40 MEQ TOTAL) BY MOUTH 2 (TWO) TIMES DAILY. (Patient taking differently: Take 20 mEq by mouth 2 (two) times daily. Take when taking Lasix 40 MG.) 360 tablet 1  . levothyroxine (SYNTHROID, LEVOTHROID) 88 MCG tablet Take 88 mcg by mouth daily before  breakfast.    . Magnesium 500 MG TABS Take 500 mg by mouth daily.     . Multiple Vitamins-Minerals (PRESERVISION AREDS 2) CAPS Take 1 capsule by mouth 2 (two) times daily.    [name] omeprazole (PRILOSEC) 40 MG capsule Take 40 mg by  mouth daily.    Vladimir Faster Glycol-Propyl Glycol (SYSTANE) 0.4-0.3 % SOLN Place 2 drops into both eyes 4 (four) times daily.    [name] pyridoxine (B-6) 100 MG tablet Take 100 mg by mouth daily with supper.     [name] rOPINIRole (REQUIP) 0.5 MG tablet Take 0.5 mg by mouth 4 (four) times daily.     [name] spironolactone (ALDACTONE) 50 MG tablet Take 0.5 tablets by mouth daily.     . vitamin B-12 (CYANOCOBALAMIN) 1000 MCG tablet Take 1,000 mcg by mouth daily.     No current facility-administered medications for this visit.    LABS/IMAGING: No results found for this or any previous visit (from the past 48 hour(s)). No results found.  VITALS: BP 124/62 (BP Location: Left Arm, Patient Position: Sitting, Cuff Size: Large)   Pulse 69   Temp (!) 96.3 F (35.7 C)   Ht 5' 5.5" (1.664 m)   Wt 191 lb (86.6 kg)   BMI 31.30 kg/m   EXAM: General appearance: alert and no distress Neck: no carotid bruit, no JVD and thyroid not enlarged, symmetric, no tenderness/mass/nodules Lungs: clear to auscultation bilaterally Heart: regular rate and rhythm, S1, S2 normal and systolic murmur: early systolic 2/6, crescendo at 2nd right intercostal space Abdomen: soft, non-tender; bowel sounds normal; no masses,  no organomegaly Extremities: extremities normal, atraumatic, no cyanosis or edema Pulses: 2+ and symmetric Skin: Skin color, texture, turgor normal. No rashes or lesions Neurologic: Grossly normal Psych: Pleasant  EKG: Normal sinus rhythm at 69-personally reviewed  ASSESSMENT: 1. Paroxysmal atrial fibrillation, s/p catheter ablation (01/2017) - [id number] on Eliquis 2. 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 HTN - controlled 4. Dyslipidemia -on  low-dose atorvastatin  5. Moderate to severe aortic stenosis-mean gradient 26 mmHg (08/2019) 6. History of depression  PLAN: 1.  Mrs. Prive has only had more palpitations and had an implanted loop recorder placed.  Her echo in November showed moderate to severe aortic stenosis however on exam today I feel like it is most likely moderate.  There is a clear second heart sound.  She will have a repeat echo in about a month.  Will contact her with those results however I think her dyspnea is likely multifactorial.  More recently her diuretics have been decreased due to some worsening renal function and signs of dehydration.  She may have been more symptomatic if her preload was low.  Plan follow-up in 6 months or sooner as necessary.  [name], MD, [facility], [place] Director of the Advanced Lipid Disorders &  Cardiovascular Risk Reduction Clinic Diplomate of the American Board of Clinical Lipidology Attending Cardiologist  Direct Dial: [phone]  Fax: [phone]  Website:  [email or web address] 01/10/2020, 2:59 PM

## 2020-01-27 ENCOUNTER — Ambulatory Visit (INDEPENDENT_AMBULATORY_CARE_PROVIDER_SITE_OTHER): Payer: Medicare PPO | Admitting: *Deleted

## 2020-01-27 DIAGNOSIS — I4819 Other persistent atrial fibrillation: Secondary | ICD-10-CM

## 2020-01-27 LAB — CUP PACEART REMOTE DEVICE CHECK
Date Time Interrogation Session: 20210422092152
Implantable Pulse Generator Implant Date: 20210322

## 2020-01-28 NOTE — Progress Notes (Signed)
ILR Remote 

## 2020-02-08 ENCOUNTER — Ambulatory Visit (HOSPITAL_COMMUNITY): Payer: Medicare PPO | Attending: Cardiology

## 2020-02-08 ENCOUNTER — Other Ambulatory Visit: Payer: Self-pay

## 2020-02-08 DIAGNOSIS — I350 Nonrheumatic aortic (valve) stenosis: Secondary | ICD-10-CM | POA: Diagnosis present

## 2020-02-28 LAB — CUP PACEART REMOTE DEVICE CHECK
Date Time Interrogation Session: 20210523092241
Implantable Pulse Generator Implant Date: 20210322

## 2020-02-29 ENCOUNTER — Ambulatory Visit (INDEPENDENT_AMBULATORY_CARE_PROVIDER_SITE_OTHER): Payer: Medicare PPO | Admitting: *Deleted

## 2020-02-29 DIAGNOSIS — I480 Paroxysmal atrial fibrillation: Secondary | ICD-10-CM | POA: Diagnosis not present

## 2020-02-29 NOTE — Progress Notes (Signed)
[facility] Summary Report / Loop Recorder 

## 2020-03-15 ENCOUNTER — Ambulatory Visit (HOSPITAL_COMMUNITY): Payer: Medicare PPO | Admitting: Nurse Practitioner

## 2020-03-16 ENCOUNTER — Other Ambulatory Visit: Payer: Self-pay

## 2020-03-16 ENCOUNTER — Ambulatory Visit (HOSPITAL_COMMUNITY)
Admission: RE | Admit: 2020-03-16 | Discharge: 2020-03-16 | Disposition: A | Discharge status: Home / Self Care | Payer: Medicare PPO | Source: Ambulatory Visit | Attending: Nurse Practitioner | Admitting: Nurse Practitioner

## 2020-03-16 VITALS — BP 118/66 | HR 70 | Ht 65.5 in | Wt 189.0 lb

## 2020-03-16 DIAGNOSIS — I509 Heart failure, unspecified: Secondary | ICD-10-CM | POA: Diagnosis not present

## 2020-03-16 DIAGNOSIS — Z8249 Family history of ischemic heart disease and other diseases of the circulatory system: Secondary | ICD-10-CM | POA: Diagnosis not present

## 2020-03-16 DIAGNOSIS — G2581 Restless legs syndrome: Secondary | ICD-10-CM | POA: Insufficient documentation

## 2020-03-16 DIAGNOSIS — Z8349 Family history of other endocrine, nutritional and metabolic diseases: Secondary | ICD-10-CM | POA: Insufficient documentation

## 2020-03-16 DIAGNOSIS — Z79899 Other long term (current) drug therapy: Secondary | ICD-10-CM | POA: Diagnosis not present

## 2020-03-16 DIAGNOSIS — D6869 Other thrombophilia: Secondary | ICD-10-CM | POA: Diagnosis not present

## 2020-03-16 DIAGNOSIS — I110 Hypertensive heart disease with heart failure: Secondary | ICD-10-CM | POA: Insufficient documentation

## 2020-03-16 DIAGNOSIS — I480 Paroxysmal atrial fibrillation: Secondary | ICD-10-CM

## 2020-03-16 DIAGNOSIS — M1990 Unspecified osteoarthritis, unspecified site: Secondary | ICD-10-CM | POA: Diagnosis not present

## 2020-03-16 DIAGNOSIS — G4733 Obstructive sleep apnea (adult) (pediatric): Secondary | ICD-10-CM | POA: Diagnosis not present

## 2020-03-16 DIAGNOSIS — E039 Hypothyroidism, unspecified: Secondary | ICD-10-CM | POA: Diagnosis not present

## 2020-03-16 DIAGNOSIS — Z7901 Long term (current) use of anticoagulants: Secondary | ICD-10-CM | POA: Insufficient documentation

## 2020-03-16 DIAGNOSIS — M545 Low back pain: Secondary | ICD-10-CM | POA: Insufficient documentation

## 2020-03-16 DIAGNOSIS — G8929 Other chronic pain: Secondary | ICD-10-CM | POA: Insufficient documentation

## 2020-03-16 DIAGNOSIS — K219 Gastro-esophageal reflux disease without esophagitis: Secondary | ICD-10-CM | POA: Diagnosis not present

## 2020-03-16 DIAGNOSIS — Z79890 Hormone replacement therapy (postmenopausal): Secondary | ICD-10-CM | POA: Insufficient documentation

## 2020-03-16 DIAGNOSIS — I428 Other cardiomyopathies: Secondary | ICD-10-CM | POA: Diagnosis not present

## 2020-03-16 DIAGNOSIS — E119 Type 2 diabetes mellitus without complications: Secondary | ICD-10-CM | POA: Insufficient documentation

## 2020-03-16 DIAGNOSIS — I4819 Other persistent atrial fibrillation: Secondary | ICD-10-CM | POA: Diagnosis present

## 2020-03-16 DIAGNOSIS — Z881 Allergy status to other antibiotic agents status: Secondary | ICD-10-CM | POA: Insufficient documentation

## 2020-03-16 DIAGNOSIS — Z833 Family history of diabetes mellitus: Secondary | ICD-10-CM | POA: Insufficient documentation

## 2020-03-16 DIAGNOSIS — F329 Major depressive disorder, single episode, unspecified: Secondary | ICD-10-CM | POA: Diagnosis not present

## 2020-03-16 DIAGNOSIS — F419 Anxiety disorder, unspecified: Secondary | ICD-10-CM | POA: Diagnosis not present

## 2020-03-16 NOTE — Progress Notes (Signed)
Primary Care Physician: Chesley Noon, MD Referring Physician:Dr. Allred Cardiologist:  Dr. Marga Melnick Jenna [name] is a 79 y.o. female with a h/o afib ablation 01/10/2017 by Dr. Rayann Heman, in the [facility] clinic for f/u.  She had become refractory to amiodarone and developed  TCM with multiple admissions for CHF.She has done well since the procedure. She had a recent Linq implanted and on first Paceart report late May, did not have any arrhythmia noted.  Continues on apixaban without missed doses for chadsvasc score of at least 5. She is due for her physical in June and is due to have screening labs done.  Today, she denies symptoms of palpitations, chest pain, shortness of breath, orthopnea, PND, lower extremity edema, dizziness, presyncope, syncope, or neurologic sequela. The patient is tolerating medications without difficulties and is otherwise without complaint today.   Past Medical History:  Diagnosis Date  . Anxiety   . CHF (congestive heart failure) ([place])   . Chronic lower back pain   . Chronic systolic dysfunction of left ventricle   . Depression   . Dysrhythmia    A-Fib  . Fatigue   . GERD (gastroesophageal reflux disease)   . High cholesterol   . Hypertension   . Hypothyroidism   . Melanoma of shoulder, left ([place])   . Mild aortic stenosis   . Nonischemic cardiomyopathy ([place])   . OSA on CPAP    reports compliance with CPAP  . Osteoarthritis   . Persistent atrial fibrillation with RVR ([place])    [name] 11/15/2016  . PONV (postoperative nausea and vomiting)   . RLS (restless legs syndrome)   . S/P ablation of atrial flutter/ fib 01/10/17 01/11/2017  . Type II diabetes mellitus (HCC)    no medications   Past Surgical History:  Procedure Laterality Date  . ATRIAL FIBRILLATION ABLATION N/A 01/10/2017   Procedure: Atrial Fibrillation Ablation;  Surgeon: Thompson Grayer, MD;  Location: [facility];  Service: Cardiovascular;  Laterality: N/A;  . CARDIOVERSION N/A 11/19/2016    Procedure: CARDIOVERSION;  Surgeon: Lelon Perla, MD;  Location: [facility] ENDOSCOPY;  Service: Cardiovascular;  Laterality: N/A;  . CARDIOVERSION N/A 12/11/2016   Procedure: CARDIOVERSION;  Surgeon: Dorothy Spark, MD;  Location: [facility] ENDOSCOPY;  Service: Cardiovascular;  Laterality: N/A;  . CARDIOVERSION N/A 12/18/2016   Procedure: CARDIOVERSION;  Surgeon: Skeet Latch, MD;  Location: [facility] ENDOSCOPY;  Service: Cardiovascular;  Laterality: N/A;  . CATARACT EXTRACTION W/ INTRAOCULAR LENS  IMPLANT, BILATERAL Bilateral   . DILATION AND CURETTAGE OF UTERUS    . implantable loop recorder placement  12/27/2019   Medtronic Reveal Linq model LNQ 33 (SN RLA [id number]) implanted in office by Dr Rayann Heman  . LEXISCAN MYOVIEW  05/13/2012   No ECG changes. EKG negative for ischemia. No significant ischemia demonstrated.  [name] MELANOMA EXCISION Left ~ 09/1979   "back of my shoulder"  . TEE WITHOUT CARDIOVERSION N/A 11/19/2016   Procedure: TRANSESOPHAGEAL ECHOCARDIOGRAM (TEE);  Surgeon: Lelon Perla, MD;  Location: [facility] ENDOSCOPY;  Service: Cardiovascular;  Laterality: N/A;  need CV, but no anesthesia available  . TEE WITHOUT CARDIOVERSION N/A 01/10/2017   Procedure: TRANSESOPHAGEAL ECHOCARDIOGRAM (TEE);  Surgeon: [name], MD;  Location: [facility] ENDOSCOPY;  Service: Cardiovascular;  Laterality: N/A;  . TOTAL KNEE ARTHROPLASTY Right 07/28/2018   Procedure: RIGHT TOTAL KNEE ARTHROPLASTY;  Surgeon: Mcarthur Rossetti, MD;  Location: [facility];  Service: Orthopedics;  Laterality: Right;  . TRANSTHORACIC ECHOCARDIOGRAM  02/18/2012   EF >  55%, mild aortic stenosis  . VAGINAL HYSTERECTOMY  1985    Current Outpatient Medications  Medication Sig Dispense Refill  . acetaminophen (TYLENOL) 500 MG tablet Take 500-1,000 mg by mouth daily.     [name] ALPRAZolam (XANAX) 0.5 MG tablet Take 0.5 mg by mouth 2 (two) times daily.     [name] apixaban (ELIQUIS) 5 MG TABS tablet Take 1 tablet (5 mg total) by mouth 2 (two) times daily. 180 tablet  1  . atorvastatin (LIPITOR) 20 MG tablet Take 1 tablet (20 mg total) by mouth 3 (three) times a week. 36 tablet 3  . buPROPion (WELLBUTRIN XL) 150 MG 24 hr tablet Take 300 mg by mouth daily.     . Calcium Carbonate-Vitamin D (CALCIUM + D PO) Take 1 tablet by mouth 2 (two) times daily.     . carvedilol (COREG) 12.5 MG tablet Take 1 tablet (12.5 mg total) by mouth 2 (two) times daily. 90 tablet 1  . furosemide (LASIX) 20 MG tablet Take 20 mg by mouth every other day. Alternating with the 40 MG.    . furosemide (LASIX) 40 MG tablet Take 40 mg by mouth every other day. Use as directed    . levothyroxine (SYNTHROID, LEVOTHROID) 88 MCG tablet Take 88 mcg by mouth daily before breakfast.    . Magnesium 500 MG TABS Take 500 mg by mouth daily.     . Multiple Vitamins-Minerals (PRESERVISION AREDS 2) CAPS Take 1 capsule by mouth 2 (two) times daily.    [name] omeprazole (PRILOSEC) 40 MG capsule Take 40 mg by mouth daily.    [name] ULTRA test strip     . Polyethyl Glycol-Propyl Glycol (SYSTANE) 0.4-0.3 % SOLN Place 2 drops into both eyes 4 (four) times daily.    . potassium chloride SA (KLOR-CON M20) 20 MEQ tablet Take 2 tablets (40 mEq total) by mouth every other day. 180 tablet 3  . pyridoxine (B-6) 100 MG tablet Take 100 mg by mouth daily with supper.     [name] rOPINIRole (REQUIP) 0.5 MG tablet Take 0.5 mg by mouth 4 (four) times daily.     [name] spironolactone (ALDACTONE) 50 MG tablet Take 0.5 tablets by mouth daily.     . vitamin B-12 (CYANOCOBALAMIN) 1000 MCG tablet Take 1,000 mcg by mouth daily.     No current facility-administered medications for this encounter.    Allergies  Allergen Reactions  . Amoxicillin Nausea Only  . Crestor [Rosuvastatin] Other (See Comments)    Joints ached  . Lovastatin Other (See Comments)    Joints hurt all over body     Social History   Socioeconomic History  . Marital status: Widowed    Spouse name: Not on file  . Number of children: Not on file  . Years of  education: Not on file  . Highest education level: Not on file  Occupational History  . Not on file  Tobacco Use  . Smoking status: Never Smoker  . Smokeless tobacco: Never Used  Vaping Use  . Vaping Use: Never used  Substance and Sexual Activity  . Alcohol use: No  . Drug use: Never  . Sexual activity: Never  Other Topics Concern  . Not on file  Social History Narrative  . Not on file   Social Determinants of Health   Financial Resource Strain:   . Difficulty of Paying Living Expenses:   Food Insecurity:   . Worried About Charity fundraiser in the Last Year:   .  Ran Out of Food in the Last Year:   Transportation Needs:   . Film/video editor (Medical):   [name] Lack of Transportation (Non-Medical):   Physical Activity:   . Days of Exercise per Week:   . Minutes of Exercise per Session:   Stress:   . Feeling of Stress :   Social Connections:   . Frequency of Communication with Friends and Family:   . Frequency of Social Gatherings with Friends and Family:   . Attends Religious Services:   . Active Member of Clubs or Organizations:   . Attends Archivist Meetings:   [name] Marital Status:   Intimate Partner Violence:   . Fear of Current or Ex-Partner:   . Emotionally Abused:   [name] Physically Abused:   . Sexually Abused:     Family History  Problem Relation Age of Onset  . Hypertension Mother   . Diabetes Mother   . Heart failure Father   . Arrhythmia Sister   . Hyperlipidemia Sister   . Hypertension Sister   . Healthy Brother   . Stroke Paternal Grandmother   . Cancer Paternal Grandfather   . Healthy Daughter   . Hypertension Son   . Hyperlipidemia Son     ROS- All systems are reviewed and negative except as per the HPI above  Physical Exam: Vitals:   03/16/20 1441  BP: 118/66  Pulse: 70  Weight: 85.7 kg  Height: 5' 5.5" (1.664 m)   Wt Readings from Last 3 Encounters:  03/16/20 85.7 kg  01/10/20 86.6 kg  12/27/19 86.3 kg    Labs: Lab  Results  Component Value Date   NA 137 08/27/2019   K 4.7 08/27/2019   CL 97 08/27/2019   CO2 26 08/27/2019   GLUCOSE 97 08/27/2019   BUN 34 (H) 08/27/2019   CREATININE 1.28 (H) 08/27/2019   CALCIUM 10.0 08/27/2019   Lab Results  Component Value Date   INR 2.89 12/08/2016   Lab Results  Component Value Date   CHOL 170 02/11/2017   HDL 82 02/11/2017   LDLCALC 69 02/11/2017   TRIG 93 02/11/2017     GEN- The patient is well appearing, alert and oriented x 3 today.   Head- normocephalic, atraumatic Eyes-  Sclera clear, conjunctiva pink Ears- hearing intact Oropharynx- clear Neck- supple, no JVP Lymph- no cervical lymphadenopathy Lungs- Clear to ausculation bilaterally, normal work of breathing Heart- Regular rate and rhythm, no murmurs, rubs or gallops, PMI not laterally displaced GI- soft, NT, ND, + BS Extremities- no clubbing, cyanosis, or edema MS- no significant deformity or atrophy Skin- no rash or lesion Psych- euthymic mood, full affect Neuro- strength and sensation are intact  EKG- NSR at 70 bpm, pr int 182 ms, qrs int 94 ms, qtc 401 ms   Epic records reviewed   Assessment and Plan: 1. Persistent  afib S/p  ablation and is doing well with no known afib burden Recent Linq implanted and first report did not show any arrhythmia  Continue  carvedilol 12.5 mg bid  Continue Eliquis 5 mg bid for CHA2DS2VASc score of at least 5   2. NICM EF recovered with return of SR  3. OSA Continue cpap  4. HTN Stable  5. Linq  Per device clinic and Dr. Rayann Heman  F/u with Dr. Rayann Heman and Dr. Debara Pickett per recall    Geroge Baseman. Bruin Bolger, [facility] [address] [place], [place] [address] [phone]

## 2020-03-24 ENCOUNTER — Other Ambulatory Visit: Payer: Self-pay | Admitting: Family Medicine

## 2020-03-24 DIAGNOSIS — Z1231 Encounter for screening mammogram for malignant neoplasm of breast: Secondary | ICD-10-CM

## 2020-04-03 ENCOUNTER — Ambulatory Visit (INDEPENDENT_AMBULATORY_CARE_PROVIDER_SITE_OTHER): Payer: Medicare PPO | Admitting: *Deleted

## 2020-04-03 DIAGNOSIS — I480 Paroxysmal atrial fibrillation: Secondary | ICD-10-CM

## 2020-04-03 LAB — CUP PACEART REMOTE DEVICE CHECK
Date Time Interrogation Session: 20210628001219
Implantable Pulse Generator Implant Date: 20210322

## 2020-04-04 NOTE — Progress Notes (Signed)
[facility] Summary Report / Loop Recorder 

## 2020-04-14 ENCOUNTER — Telehealth: Payer: Self-pay

## 2020-04-14 NOTE — Telephone Encounter (Signed)
The pt states her handheld is saying Remember on top in red. I gave the number to tech support to get additional help.

## 2020-04-16 ENCOUNTER — Encounter (HOSPITAL_COMMUNITY): Payer: Self-pay | Admitting: Emergency Medicine

## 2020-04-16 ENCOUNTER — Other Ambulatory Visit: Payer: Self-pay

## 2020-04-16 ENCOUNTER — Emergency Department (HOSPITAL_COMMUNITY): Payer: Medicare PPO

## 2020-04-16 ENCOUNTER — Emergency Department (HOSPITAL_COMMUNITY)
Admission: EM | Admit: 2020-04-16 | Discharge: 2020-04-16 | Disposition: A | Discharge status: Home / Self Care | Payer: Medicare PPO | Attending: Emergency Medicine | Admitting: Emergency Medicine

## 2020-04-16 DIAGNOSIS — Z7901 Long term (current) use of anticoagulants: Secondary | ICD-10-CM | POA: Diagnosis not present

## 2020-04-16 DIAGNOSIS — I5023 Acute on chronic systolic (congestive) heart failure: Secondary | ICD-10-CM | POA: Insufficient documentation

## 2020-04-16 DIAGNOSIS — E119 Type 2 diabetes mellitus without complications: Secondary | ICD-10-CM | POA: Insufficient documentation

## 2020-04-16 DIAGNOSIS — I110 Hypertensive heart disease with heart failure: Secondary | ICD-10-CM | POA: Insufficient documentation

## 2020-04-16 DIAGNOSIS — Z79899 Other long term (current) drug therapy: Secondary | ICD-10-CM | POA: Insufficient documentation

## 2020-04-16 DIAGNOSIS — Z79890 Hormone replacement therapy (postmenopausal): Secondary | ICD-10-CM | POA: Insufficient documentation

## 2020-04-16 DIAGNOSIS — D649 Anemia, unspecified: Secondary | ICD-10-CM

## 2020-04-16 DIAGNOSIS — E039 Hypothyroidism, unspecified: Secondary | ICD-10-CM | POA: Diagnosis not present

## 2020-04-16 DIAGNOSIS — R531 Weakness: Secondary | ICD-10-CM

## 2020-04-16 DIAGNOSIS — I480 Paroxysmal atrial fibrillation: Secondary | ICD-10-CM | POA: Insufficient documentation

## 2020-04-16 LAB — TROPONIN I (HIGH SENSITIVITY)
Troponin I (High Sensitivity): 6 ng/L (ref <18)
Troponin I (High Sensitivity): 7 ng/L (ref <18)

## 2020-04-16 LAB — BASIC METABOLIC PANEL
Anion gap: 11 (ref 5–15)
BUN: 26 mg/dL — ABNORMAL HIGH (ref 8–23)
CO2: 28 mmol/L (ref 22–32)
Calcium: 9.7 mg/dL (ref 8.9–10.3)
Chloride: 98 mmol/L (ref 98–111)
Creatinine, Ser: 1.03 mg/dL — ABNORMAL HIGH (ref 0.44–1.00)
GFR calc Af Amer: >60 mL/min (ref >60)
GFR calc non Af Amer: 52 mL/min — ABNORMAL LOW (ref >60)
Glucose, Bld: 94 mg/dL (ref 70–99)
Potassium: 4.3 mmol/L (ref 3.5–5.1)
Sodium: 137 mmol/L (ref 135–145)

## 2020-04-16 LAB — URINALYSIS, ROUTINE W REFLEX MICROSCOPIC
Bilirubin Urine: NEGATIVE
Glucose, UA: NEGATIVE mg/dL
Hgb urine dipstick: NEGATIVE
Ketones, ur: NEGATIVE mg/dL
Leukocytes,Ua: NEGATIVE
Nitrite: NEGATIVE
Protein, ur: NEGATIVE mg/dL
Specific Gravity, Urine: 1.005 (ref 1.005–1.030)
pH: 7 (ref 5.0–8.0)

## 2020-04-16 LAB — BRAIN NATRIURETIC PEPTIDE: B Natriuretic Peptide: 109.6 pg/mL — ABNORMAL HIGH (ref 0.0–100.0)

## 2020-04-16 LAB — CBC
HCT: 34.6 % — ABNORMAL LOW (ref 36.0–46.0)
Hemoglobin: 10.3 g/dL — ABNORMAL LOW (ref 12.0–15.0)
MCH: 22.9 pg — ABNORMAL LOW (ref 26.0–34.0)
MCHC: 29.8 g/dL — ABNORMAL LOW (ref 30.0–36.0)
MCV: 76.9 fL — ABNORMAL LOW (ref 80.0–100.0)
Platelets: 486 10*3/uL — ABNORMAL HIGH (ref 150–400)
RBC: 4.5 MIL/uL (ref 3.87–5.11)
RDW: 17.3 % — ABNORMAL HIGH (ref 11.5–15.5)
WBC: 5.8 10*3/uL (ref 4.0–10.5)
nRBC: 0 % (ref 0.0–0.2)

## 2020-04-16 MED ORDER — SODIUM CHLORIDE 0.9% FLUSH: 3.0000 mL | Freq: Once | INTRAVENOUS | Status: DC

## 2020-04-16 NOTE — ED Provider Notes (Signed)
[facility] EMERGENCY DEPARTMENT Provider Note   CSN: [id number] Arrival date & time: 04/16/20  1355     History Chief Complaint  Patient presents with  . Fatigue  . Weakness    Colleene Arnetha Silverthorne is a 79 y.o. female.  HPI    79 yo female ho chf, paroxysmal a fib, hypertension, on lasix and spironolactone with some dyspnea on exertion and increased peripheral edema presents today after episode of lightheadedness at church today that lasted about 15 minutes.  She denies chest pain, increased dyspnea, fever, chills, nausea, vomiting, or pain.  She had increased her lasix last weekend but went back to usual dose.  She denies lateralized symptoms, she did not lose consciousness.  She had ablation April 2018 but has had ongoing paroxysmal a fib.  Loop recorder in place- patient states she is pretty sure she had an episode this am.  She states her bp was low at 105 and she had a strange feeling in her chest which she associates with a fib.  Past Medical History:  Diagnosis Date  . Anxiety   . CHF (congestive heart failure) ([place])   . Chronic lower back pain   . Chronic systolic dysfunction of left ventricle   . Depression   . Dysrhythmia    A-Fib  . Fatigue   . GERD (gastroesophageal reflux disease)   . High cholesterol   . Hypertension   . Hypothyroidism   . Melanoma of shoulder, left ([place])   . Mild aortic stenosis   . Nonischemic cardiomyopathy ([place])   . OSA on CPAP    reports compliance with CPAP  . Osteoarthritis   . Persistent atrial fibrillation with RVR ([place])    [name] 11/15/2016  . PONV (postoperative nausea and vomiting)   . RLS (restless legs syndrome)   . S/P ablation of atrial flutter/ fib 01/10/17 01/11/2017  . Type II diabetes mellitus ([facility])    no medications    Patient Active Problem List   Diagnosis Date Noted  . Status post total knee replacement, right 07/28/2018  . Primary osteoarthritis of right knee 12/08/2017  . Nonischemic cardiomyopathy  ([place]) 05/14/2017  . S/P ablation of atrial flutter/ fib 01/10/17 01/11/2017  . A-fib ([place]) 01/10/2017  . Weakness 12/17/2016  . Acute kidney injury superimposed on chronic kidney disease ([place]) 12/17/2016  . Hyperglycemia 12/17/2016  . Normocytic anemia 12/17/2016  . Acute kidney injury ([place]) 12/08/2016  . Acute on chronic systolic congestive heart failure ([place]) 12/08/2016  . Hyperkalemia 12/08/2016  . Cystitis   . Persistent atrial fibrillation ([place]) 11/15/2016  . Depressed 07/04/2015  . Peripheral vertigo 12/16/2014  . Vertigo 12/16/2014  . Aortic valve stenosis 07/26/2013  . HTN (hypertension) 07/26/2013  . Hypothyroidism 07/26/2013  . PAF (paroxysmal atrial fibrillation) ([place]) 07/26/2013  . Anxiety 07/26/2013  . Aortic valve disorder 07/26/2013  . GERD (gastroesophageal reflux disease) 07/26/2013  . OA (osteoarthritis) 07/26/2013  . Obstructive sleep apnea 06/20/2008  . RESTLESS LEGS SYNDROME 06/20/2008    Past Surgical History:  Procedure Laterality Date  . ATRIAL FIBRILLATION ABLATION N/A 01/10/2017   Procedure: Atrial Fibrillation Ablation;  Surgeon: Thompson Grayer, MD;  Location: [facility] LAB;  Service: Cardiovascular;  Laterality: N/A;  . CARDIOVERSION N/A 11/19/2016   Procedure: CARDIOVERSION;  Surgeon: Lelon Perla, MD;  Location[facility] ENDOSCOPY;  Service: Cardiovascular;  Laterality: N/A;  . CARDIOVERSION N/A 12/11/2016   Procedure: CARDIOVERSION;  Surgeon: Dorothy Spark, MD;  Location: [facility];  Service:  Cardiovascular;  Laterality: N/A;  . CARDIOVERSION N/A 12/18/2016   Procedure: CARDIOVERSION;  Surgeon: Skeet Latch, MD;  Location: [facility] ENDOSCOPY;  Service: Cardiovascular;  Laterality: N/A;  . CATARACT EXTRACTION W/ INTRAOCULAR LENS  IMPLANT, BILATERAL Bilateral   . DILATION AND CURETTAGE OF UTERUS    . implantable loop recorder placement  12/27/2019   Medtronic Reveal Linq model LNQ 11 (SN [id number]) implanted in office by Dr Rayann Heman  . LEXISCAN  MYOVIEW  05/13/2012   No ECG changes. EKG negative for ischemia. No significant ischemia demonstrated.  [name] MELANOMA EXCISION Left ~ 09/1979   "back of my shoulder"  . TEE WITHOUT CARDIOVERSION N/A 11/19/2016   Procedure: TRANSESOPHAGEAL ECHOCARDIOGRAM (TEE);  Surgeon: Lelon Perla, MD;  Location: [facility] ENDOSCOPY;  Service: Cardiovascular;  Laterality: N/A;  need CV, but no anesthesia available  . TEE WITHOUT CARDIOVERSION N/A 01/10/2017   Procedure: TRANSESOPHAGEAL ECHOCARDIOGRAM (TEE);  Surgeon: [name], MD;  Location[facility] ENDOSCOPY;  Service: Cardiovascular;  Laterality: N/A;  . TOTAL KNEE ARTHROPLASTY Right 07/28/2018   Procedure: RIGHT TOTAL KNEE ARTHROPLASTY;  Surgeon: Mcarthur Rossetti, MD;  Location: [facility];  Service: Orthopedics;  Laterality: Right;  . TRANSTHORACIC ECHOCARDIOGRAM  02/18/2012   EF >55%, mild aortic stenosis  . VAGINAL HYSTERECTOMY  1985     OB History   No obstetric history on file.     Family History  Problem Relation Age of Onset  . Hypertension Mother   . Diabetes Mother   . Heart failure Father   . Arrhythmia Sister   . Hyperlipidemia Sister   . Hypertension Sister   . Healthy Brother   . Stroke Paternal Grandmother   . Cancer Paternal Grandfather   . Healthy Daughter   . Hypertension Son   . Hyperlipidemia Son     Social History   Tobacco Use  . Smoking status: Never Smoker  . Smokeless tobacco: Never Used  Vaping Use  . Vaping Use: Never used  Substance Use Topics  . Alcohol use: No  . Drug use: Never    Home Medications Prior to Admission medications   Medication Sig Start Date End Date Taking? Authorizing Provider  acetaminophen (TYLENOL) 500 MG tablet Take 500-1,000 mg by mouth daily.     [provider]  ALPRAZolam Duanne Moron) 0.5 MG tablet Take 0.5 mg by mouth 2 (two) times daily.  07/05/16   [provider]  apixaban (ELIQUIS) 5 MG TABS tablet Take 1 tablet (5 mg total) by mouth 2 (two) times daily. 01/10/20    Hilty, Nadean Corwin, MD  atorvastatin (LIPITOR) 20 MG tablet Take 1 tablet (20 mg total) by mouth 3 (three) times a week. 12/17/19   Allred, Jeneen Rinks, MD  buPROPion (WELLBUTRIN XL) 150 MG 24 hr tablet Take 300 mg by mouth daily.  08/07/15   [provider]  Calcium Carbonate-Vitamin D (CALCIUM + D PO) Take 1 tablet by mouth 2 (two) times daily.     [provider]  carvedilol (COREG) 12.5 MG tablet Take 1 tablet (12.5 mg total) by mouth 2 (two) times daily. 08/27/19   Duke, Tami Lin, PA  furosemide (LASIX) 20 MG tablet Take 20 mg by mouth every other day. Alternating with the 40 MG.    [provider]  furosemide (LASIX) 40 MG tablet Take 40 mg by mouth every other day. Use as directed 09/24/17   [provider]  levothyroxine (SYNTHROID, LEVOTHROID) 88 MCG tablet Take 88 mcg by mouth daily before breakfast.  [provider]  Magnesium 500 MG TABS Take 500 mg by mouth daily.     [provider]  Multiple Vitamins-Minerals (PRESERVISION AREDS 2) CAPS Take 1 capsule by mouth 2 (two) times daily.    [provider]  omeprazole (PRILOSEC) 40 MG capsule Take 40 mg by mouth daily.    [provider]  [facility] ULTRA test strip  02/08/20   [provider]  Polyethyl Glycol-Propyl Glycol (SYSTANE) 0.4-0.3 % SOLN Place 2 drops into both eyes 4 (four) times daily.    [provider]  potassium chloride SA (KLOR-CON M20) 20 MEQ tablet Take 2 tablets (40 mEq total) by mouth every other day. 01/10/20   Hilty, Nadean Corwin, MD  pyridoxine (B-6) 100 MG tablet Take 100 mg by mouth daily with supper.     [provider]  rOPINIRole (REQUIP) 0.5 MG tablet Take 0.5 mg by mouth 4 (four) times daily.     [provider]  spironolactone (ALDACTONE) 50 MG tablet Take 0.5 tablets by mouth daily.  03/23/19   [provider]  vitamin B-12 (CYANOCOBALAMIN) 1000 MCG tablet Take 1,000 mcg by mouth daily.    [provider]    Allergies    Amoxicillin, Crestor [rosuvastatin], and Lovastatin  Review of Systems   Review of Systems  All other systems reviewed and are negative.   Physical Exam Updated Vital Signs BP (!) 153/101   Pulse 100   Temp 97.7 F (36.5 C) (Oral)   Resp 20   SpO2 100%   Physical Exam Vitals reviewed.  Constitutional:      Appearance: Normal appearance.  HENT:     Head: Normocephalic.     Right Ear: External ear normal.     Left Ear: External ear normal.     Nose: Nose normal.     Mouth/Throat:     Mouth: Mucous membranes are moist.  Eyes:     Pupils: Pupils are equal, round, and reactive to light.  Cardiovascular:     Rate and Rhythm: Normal rate and regular rhythm.     Pulses: Normal pulses.  Pulmonary:     Effort: Pulmonary effort is normal.  Abdominal:     General: Abdomen is flat.     Palpations: Abdomen is soft.  Musculoskeletal:        General: Normal range of motion.     Cervical back: Normal range of motion and neck supple.     Right lower leg: Edema present.     Left lower leg: Edema present.     Comments: Trace edema bilateral lower extremities  Skin:    Capillary Refill: Capillary refill takes less than 2 seconds.  Neurological:     General: No focal deficit present.     Mental Status: She is alert.  Psychiatric:        Mood and Affect: Mood normal.        Behavior: Behavior normal.     ED Results / Procedures / Treatments   Labs (all labs ordered are listed, but only abnormal results are displayed) Labs Reviewed  BASIC METABOLIC PANEL - Abnormal; Notable for the following components:      Result Value   BUN 26 (*)    Creatinine, Ser 1.03 (*)    GFR calc non Af Amer 52 (*)    All other components within normal limits  CBC - Abnormal; Notable for the following components:   Hemoglobin 10.3 (*)    HCT 34.6 (*)  MCV 76.9 (*)    MCH 22.9 (*)    MCHC 29.8 (*)    RDW 17.3 (*)    Platelets 486 (*)    All other components  within normal limits  URINALYSIS, ROUTINE W REFLEX MICROSCOPIC - Abnormal; Notable for the following components:   Color, Urine STRAW (*)    All other components within normal limits  BRAIN NATRIURETIC PEPTIDE  CBG MONITORING, ED  TROPONIN I (HIGH SENSITIVITY)    EKG EKG Interpretation  Date/Time:  Sunday April 16 2020 13:57:15 EDT Ventricular Rate:  89 PR Interval:  228 QRS Duration: 82 QT Interval:  350 QTC Calculation: 425 R Axis:   -44 Text Interpretation: Sinus rhythm with 1st degree A-V block Left axis deviation Inferior infarct , age undetermined Possible Anterior infarct , age undetermined Abnormal ECG Confirmed by Aubery Douthat ([id number]) on 04/16/2020 5:58:22 PM   Radiology DG Chest Port 1 View  Result Date: 04/16/2020 CLINICAL DATA:  78 year old female with shortness of breath. EXAM: PORTABLE CHEST 1 VIEW COMPARISON:  Chest radiographs 09/13/2018 and earlier. FINDINGS: Portable AP semi upright view at 2248 hours. Stable mild cardiomegaly. There is now a cutaneous type AICD device on the left. Other mediastinal contours are within normal limits. Visualized tracheal air column is within normal limits. Allowing for portable technique the lungs are clear. No pneumothorax. No acute osseous abnormality identified. IMPRESSION: No acute cardiopulmonary abnormality. Electronically Signed   By: H  Hall M.D.   On: 04/16/2020 22:57    Procedures Procedures (including critical care time)  Medications Ordered in ED Medications  sodium chloride flush (NS) 0.9 % injection 3 mL (has no administration in time range)    ED Course  I have reviewed the triage vital signs and the nursing notes.  Pertinent labs & imaging results that were available during my care of the patient were reviewed by me and considered in my medical decision making (see chart for details).    MDM Rules/Calculators/A&P                          78  yo female with history of paroxysmal a fib who presents today with  episode of this while at church.  Symptoms have now improved.  UA shin here reveals mild anemia with hemoglobin 10.3.  She is on Eliquis and has no history of GI bleeding.  We attempted to obtain stool for Hemoccult but were not able to obtain any here in the ED.  She is evaluated with labs, EKG, and troponin and repeat troponin which are normal.  Patient advised regarding need for follow-up with her cardiologist and primary care doctor.  Patient is advised of return precautions and voices understanding.  Final Clinical Impression(s) / ED Diagnoses Final diagnoses:  Weakness  Anemia, unspecified type    Rx / DC Orders ED Discharge Orders    None       Pattricia Boss, MD 04/16/20 2311

## 2020-04-16 NOTE — ED Notes (Signed)
Lab to add on troponin and BNP.

## 2020-04-16 NOTE — ED Triage Notes (Signed)
Pt to triage via [facility] from church.  Reports fatigue, generalized weakness, and intermittent dizziness x 1 week.  Stopped Potassium 2 weeks ago per PCP.  Also reports "irritation" with urination.  Hx of afib and states she felt like she was in afib a few times over the last week.    20g IV RAC.

## 2020-04-18 NOTE — Progress Notes (Signed)
Virtual Visit via Telephone Note   This visit type was conducted due to national recommendations for restrictions regarding the COVID-19 Pandemic (e.g. social distancing) in an effort to limit this patient's exposure and mitigate transmission in our community.  Due to her co-morbid illnesses, this patient is at least at moderate risk for complications without adequate follow up.  This format is felt to be most appropriate for this patient at this time.  The patient did not have access to video technology/had technical difficulties with video requiring transitioning to audio format only (telephone).  All issues noted in this document were discussed and addressed.  No physical exam could be performed with this format.  Please refer to the patient's chart for her  consent to telehealth for [facility].   Date:  04/20/2020   ID:  Jenna, [name] [birth year], MRN [id number]  Patient Location: Home Provider Location: Office/Clinic  PCP:  Chesley Noon, MD  Cardiologist:  [name], MD  Electrophysiologist:  None   Evaluation Performed:  Follow-Up Visit  Chief Complaint:  [facility] ED Follow Up  History of Present Illness:    Jenna [name] is a 79 y.o. female who presents virtually for ongoing assessment and management of atrial fibrillation, also followed by the A. fib clinic.  She is also been seen by Dr. Rayann Heman with ILR placed.  She also has a history of moderate aortic valve stenosis with normal LV function.  On last office visit on 01/10/2020 she reported dyspnea on exertion especially walking up stairs or long distances.  Was in ED on 04/16/2020 for weakness and fatigue. She was found to be anemic,  Hgb 10.3.down from 11.3. She was at church and had near syncopal episode and EMS was called. She has not had any blood in urine, or stool. She has some epistaxis,  Lasted about 10 minutes. They are rare now.  Other history includes anxiety, chronic systolic heart failure,  hyperlipidemia, GERD, hypertension, and hypothyroidism. She takes her BP daily and it usually runs 983-382 systolic. She does have some issues with some positional dizziness which has been chronic.  She denies chest pain, DOE, PND, orthopnea. She states that she has swelling in her legs which worsens during the day, but first thing in the morning her legs are not swollen. She states that she sometimes has heart racing but this is infrequent.   The patient does not have symptoms concerning for COVID-19 infection (fever, chills, cough, or new shortness off breath).    Past Medical History:  Diagnosis Date  . Anxiety   . CHF (congestive heart failure) ([place])   . Chronic lower back pain   . Chronic systolic dysfunction of left ventricle   . Depression   . Dysrhythmia    A-Fib  . Fatigue   . GERD (gastroesophageal reflux disease)   . High cholesterol   . Hypertension   . Hypothyroidism   . Melanoma of shoulder, left ([place])   . Mild aortic stenosis   . Nonischemic cardiomyopathy ([place])   . OSA on CPAP    reports compliance with CPAP  . Osteoarthritis   . Persistent atrial fibrillation with RVR ([place])    [name] 11/15/2016  . PONV (postoperative nausea and vomiting)   . RLS (restless legs syndrome)   . S/P ablation of atrial flutter/ fib 01/10/17 01/11/2017  . Type II diabetes mellitus (HCC)    no medications   Past Surgical History:  Procedure Laterality Date  .  ATRIAL FIBRILLATION ABLATION N/A 01/10/2017   Procedure: Atrial Fibrillation Ablation;  Surgeon: Thompson Grayer, MD;  Location: [name] CV LAB;  Service: Cardiovascular;  Laterality: N/A;  . CARDIOVERSION N/A 11/19/2016   Procedure: CARDIOVERSION;  Surgeon: Lelon Perla, MD;  Location: [facility] ENDOSCOPY;  Service: Cardiovascular;  Laterality: N/A;  . CARDIOVERSION N/A 12/11/2016   Procedure: CARDIOVERSION;  Surgeon: Dorothy Spark, MD;  Location: [facility] ENDOSCOPY;  Service: Cardiovascular;  Laterality: N/A;  . CARDIOVERSION N/A 12/18/2016    Procedure: CARDIOVERSION;  Surgeon: Skeet Latch, MD;  Location: [facility] ENDOSCOPY;  Service: Cardiovascular;  Laterality: N/A;  . CATARACT EXTRACTION W/ INTRAOCULAR LENS  IMPLANT, BILATERAL Bilateral   . DILATION AND CURETTAGE OF UTERUS    . implantable loop recorder placement  12/27/2019   Medtronic Reveal Linq model LNQ 19 (SN [id number] [id number]) implanted in office by Dr Rayann Heman  . LEXISCAN MYOVIEW  05/13/2012   No ECG changes. EKG negative for ischemia. No significant ischemia demonstrated.  [name] MELANOMA EXCISION Left ~ 09/1979   "back of my shoulder"  . TEE WITHOUT CARDIOVERSION N/A 11/19/2016   Procedure: TRANSESOPHAGEAL ECHOCARDIOGRAM (TEE);  Surgeon: Lelon Perla, MD;  Location: [facility] ENDOSCOPY;  Service: Cardiovascular;  Laterality: N/A;  need CV, but no anesthesia available  . TEE WITHOUT CARDIOVERSION N/A 01/10/2017   Procedure: TRANSESOPHAGEAL ECHOCARDIOGRAM (TEE);  Surgeon: [name], MD;  Location: [facility] ENDOSCOPY;  Service: Cardiovascular;  Laterality: N/A;  . TOTAL KNEE ARTHROPLASTY Right 07/28/2018   Procedure: RIGHT TOTAL KNEE ARTHROPLASTY;  Surgeon: Mcarthur Rossetti, MD;  Location: [place];  Service: Orthopedics;  Laterality: Right;  . TRANSTHORACIC ECHOCARDIOGRAM  02/18/2012   EF >55%, mild aortic stenosis  . VAGINAL HYSTERECTOMY  1985     Current Meds  Medication Sig  . acetaminophen (TYLENOL) 500 MG tablet Take 500-1,000 mg by mouth daily.   [name] ALPRAZolam (XANAX) 0.5 MG tablet Take 0.5 mg by mouth 2 (two) times daily.   [name] apixaban (ELIQUIS) 5 MG TABS tablet Take 1 tablet (5 mg total) by mouth 2 (two) times daily.  [name] atorvastatin (LIPITOR) 20 MG tablet Take 1 tablet (20 mg total) by mouth 3 (three) times a week.  [name] buPROPion (WELLBUTRIN XL) 150 MG 24 hr tablet Take 300 mg by mouth daily.   . Calcium Carbonate-Vitamin D (CALCIUM + D PO) Take 1 tablet by mouth 2 (two) times daily.   . carvedilol (COREG) 12.5 MG tablet Take 1 tablet (12.5 mg total) by mouth 2 (two) times  daily.  . furosemide (LASIX) 20 MG tablet Take 20 mg by mouth every other day. Alternating with the 40 MG.  . furosemide (LASIX) 40 MG tablet Take 40 mg by mouth every other day. Use as directed  . levothyroxine (SYNTHROID, LEVOTHROID) 88 MCG tablet Take 88 mcg by mouth daily before breakfast.  . Magnesium 500 MG TABS Take 500 mg by mouth daily.   . Multiple Vitamins-Minerals (PRESERVISION AREDS 2) CAPS Take 1 capsule by mouth 2 (two) times daily.  [name] omeprazole (PRILOSEC) 40 MG capsule Take 40 mg by mouth daily.  [name] ULTRA test strip   . Polyethyl Glycol-Propyl Glycol (SYSTANE) 0.4-0.3 % SOLN Place 2 drops into both eyes 4 (four) times daily.  [name] pyridoxine (B-6) 100 MG tablet Take 100 mg by mouth daily with supper.   [name] rOPINIRole (REQUIP) 0.5 MG tablet Take 0.5 mg by mouth 4 (four) times daily.   [name] spironolactone (ALDACTONE) 50 MG tablet Take 0.5 tablets by mouth daily.   [name]  vitamin B-12 (CYANOCOBALAMIN) 1000 MCG tablet Take 1,000 mcg by mouth daily.     Allergies:   Amoxicillin, Crestor [rosuvastatin], and Lovastatin   Social History   Tobacco Use  . Smoking status: Never Smoker  . Smokeless tobacco: Never Used  Vaping Use  . Vaping Use: Never used  Substance Use Topics  . Alcohol use: No  . Drug use: Never     Family Hx: The patient's family history includes Arrhythmia in her sister; Cancer in her paternal grandfather; Diabetes in her mother; Healthy in her brother and daughter; Heart failure in her father; Hyperlipidemia in her sister and son; Hypertension in her mother, sister, and son; Stroke in her paternal grandmother.  ROS:   Please see the history of present illness.    All other systems reviewed and are negative.   Prior CV studies:   The following studies were reviewed today:  Echocardiogram 02/08/2020 1. Left ventricular ejection fraction, by estimation, is 60 to 65%. The  left ventricle has normal function. The left ventricle has no regional  wall motion  abnormalities. Left ventricular diastolic parameters are  consistent with Grade I diastolic  dysfunction (impaired relaxation).  2. Right ventricular systolic function is normal. The right ventricular  size is normal. There is normal pulmonary artery systolic pressure.  3. The mitral valve is normal in structure. No evidence of mitral valve  regurgitation. No evidence of mitral stenosis.  4. The aortic valve is tricuspid. Aortic valve regurgitation is mild.  Moderate aortic valve stenosis. Aortic valve mean gradient measures 24.0  mmHg. Aortic valve Vmax measures 3.07 m/s.  5. The inferior vena cava is normal in size with greater than 50%  respiratory variability, suggesting right atrial pressure of 3 mmHg.   Labs/Other Tests and Data Reviewed:    EKG:  No ECG reviewed.  Recent Labs: 04/16/2020: B Natriuretic Peptide 109.6; BUN 26; Creatinine, Ser 1.03; Hemoglobin 10.3; Platelets 486; Potassium 4.3; Sodium 137   Recent Lipid Panel Lab Results  Component Value Date/Time   CHOL 170 02/11/2017 09:41 AM   TRIG 93 02/11/2017 09:41 AM   HDL 82 02/11/2017 09:41 AM   CHOLHDL 2.1 02/11/2017 09:41 AM   LDLCALC 69 02/11/2017 09:41 AM    Wt Readings from Last 3 Encounters:  04/20/20 188 lb 12.8 oz (85.6 kg)  03/16/20 189 lb (85.7 kg)  01/10/20 191 lb (86.6 kg)     Objective:    Vital Signs:  BP 114/63   Pulse 74   Ht 5' 5.5" (1.664 m)   Wt 188 lb 12.8 oz (85.6 kg)   BMI 30.94 kg/m  Limited assessment as this is telephone visit.  VITAL SIGNS:  reviewed GEN:  no acute distress RESPIRATORY:  normal respiratory effort, symmetric expansion NEURO:  alert and oriented x 3, no obvious focal deficit PSYCH:  normal affect  ASSESSMENT & PLAN:    1. Atrial fib: Remains on coreg  12.5 mg BID. Heart rate moderately controlled. She states that HR ranges from 70's to 90's at times. She remains on Eliquis 5 mg BID. I have re-calculated her dose with her recent creatinine. She is on the  correct dosage. She will follow up in the Afib clinic.   2. Hypertension: BP is labile.  Usually down to 114 and then up to 465 systolic depending on when she takes it and if she has had higher dose of lasix that day. She continues on spironolactone and coreg. I have asked her to decrease dose of  lasix to 20 mg daily and take extra 20 mg for edema instead of alternating doses. She is to avoid salt and keep feet elevated. If she is having to take a lot of extra doses she is instructed to go back to her alternating doses.   3. Anemia: She had lower Hgb in ED of 10.1 from 11.1 on previous lab work.  I will check an anemia profile. She will need to see PCP for follow up and and appropriate referral at their discretion.   4. Moderate AoV stenosis: Noted per echo in 02/2020. She will need to have repeat echo in one year unless she is symptomatic.   COVID-19 Education: The signs and symptoms of COVID-19 were discussed with the patient and how to seek care for testing (follow up with PCP or arrange E-visit). The importance of social distancing was discussed today.  Time:   Today, I have spent 25  minutes with the patient with telehealth technology discussing the above problems, reviewing recent ED visit and prior documentation.   Medication Adjustments/Labs and Tests Ordered: Current medicines are reviewed at length with the patient today.  Concerns regarding medicines are outlined above.   Tests Ordered: No orders of the defined types were placed in this encounter.   Medication Changes: No orders of the defined types were placed in this encounter.   Disposition:  Follow up one month with Dr. Debara Pickett in person.  Signed, Phill Myron. West Pugh, ANP, AACC  04/20/2020 9:02 AM    [facility]

## 2020-04-20 ENCOUNTER — Encounter: Payer: Self-pay | Admitting: Adult Health

## 2020-04-20 ENCOUNTER — Telehealth (INDEPENDENT_AMBULATORY_CARE_PROVIDER_SITE_OTHER): Payer: Medicare PPO | Admitting: Adult Health

## 2020-04-20 VITALS — BP 114/63 | HR 74 | Ht 65.5 in | Wt 188.8 lb

## 2020-04-20 DIAGNOSIS — I10 Essential (primary) hypertension: Secondary | ICD-10-CM

## 2020-04-20 DIAGNOSIS — I350 Nonrheumatic aortic (valve) stenosis: Secondary | ICD-10-CM

## 2020-04-20 DIAGNOSIS — I4821 Permanent atrial fibrillation: Secondary | ICD-10-CM

## 2020-04-20 DIAGNOSIS — E039 Hypothyroidism, unspecified: Secondary | ICD-10-CM | POA: Diagnosis not present

## 2020-04-20 DIAGNOSIS — D649 Anemia, unspecified: Secondary | ICD-10-CM

## 2020-04-20 NOTE — Addendum Note (Signed)
Addended by: Vennie Homans on: 04/20/2020 09:45 AM   Modules accepted: Orders

## 2020-04-20 NOTE — Patient Instructions (Addendum)
Medication Instructions:  DECREASE- Furosemide(Lasix) 20 mg by mouth daily, take an extra 20 mg if needed for Edema  *If you need a refill on your cardiac medications before your next appointment, please call your pharmacy*   Lab Work: Anemia Panel  If you have labs (blood work) drawn today and your tests are completely normal, you will receive your results only by: [name] MyChart Message (if you have MyChart) OR . A paper copy in the mail If you have any lab test that is abnormal or we need to change your treatment, we will call you to review the results.   Testing/Procedures: None Ordered   Follow-Up: At [facility], you and your health needs are our priority.  As part of our continuing mission to provide you with exceptional heart care, we have created designated Provider Care Teams.  These Care Teams include your primary Cardiologist (physician) and Advanced Practice Providers (APPs -  Physician Assistants and Nurse Practitioners) who all work together to provide you with the care you need, when you need it.  We recommend signing up for the patient portal called "MyChart".  Sign up information is provided on this After Visit Summary.  MyChart is used to connect with patients for Virtual Visits (Telemedicine).  Patients are able to view lab/test results, encounter notes, upcoming appointments, etc.  Non-urgent messages can be sent to your provider as well.   To learn more about what you can do with MyChart, go to [email or web address].    Your next appointment:   August 26th @ 2:45 pm  The format for your next appointment:   Person  Provider:   Almyra Deforest, PA-C

## 2020-04-21 ENCOUNTER — Other Ambulatory Visit: Payer: Self-pay

## 2020-04-21 ENCOUNTER — Other Ambulatory Visit: Payer: Medicare PPO | Admitting: *Deleted

## 2020-04-21 ENCOUNTER — Ambulatory Visit
Admission: RE | Admit: 2020-04-21 | Discharge: 2020-04-21 | Disposition: A | Discharge status: Home / Self Care | Payer: Medicare PPO | Source: Ambulatory Visit | Attending: Family Medicine | Admitting: Family Medicine

## 2020-04-21 DIAGNOSIS — D649 Anemia, unspecified: Secondary | ICD-10-CM

## 2020-04-21 DIAGNOSIS — I4821 Permanent atrial fibrillation: Secondary | ICD-10-CM

## 2020-04-21 DIAGNOSIS — I350 Nonrheumatic aortic (valve) stenosis: Secondary | ICD-10-CM

## 2020-04-21 DIAGNOSIS — Z1231 Encounter for screening mammogram for malignant neoplasm of breast: Secondary | ICD-10-CM

## 2020-04-22 LAB — IRON AND TIBC
Iron Saturation: 4 % — CL (ref 15–55)
Iron: 19 ug/dL — ABNORMAL LOW (ref 27–139)
Total Iron Binding Capacity: 445 ug/dL (ref 250–450)
UIBC: 426 ug/dL — ABNORMAL HIGH (ref 118–369)

## 2020-04-22 LAB — VITAMIN B12: Vitamin B-12: >2000 pg/mL — ABNORMAL HIGH (ref 232–1245)

## 2020-04-22 LAB — FERRITIN: Ferritin: 9 ng/mL — ABNORMAL LOW (ref 15–150)

## 2020-04-22 LAB — FOLATE: Folate: 8.7 ng/mL (ref >3.0)

## 2020-04-25 ENCOUNTER — Telehealth: Payer: Self-pay | Admitting: Adult Health

## 2020-04-25 ENCOUNTER — Telehealth: Payer: Self-pay

## 2020-04-25 NOTE — Telephone Encounter (Signed)
Advised pt, it sounds like she woke up the monitor, maybe it got bumped in some way.  It does not appear that there are any issues with the monitor on our end.

## 2020-04-25 NOTE — Telephone Encounter (Signed)
LVM2CB 7/20 

## 2020-04-25 NOTE — Telephone Encounter (Signed)
Pt called to receive her blood test results. Please call back

## 2020-04-25 NOTE — Telephone Encounter (Signed)
Spoke to patient Jenna [name]'s advice given.She will decrease B12 to one dose daily.She will call PCP for hematology referral.

## 2020-04-25 NOTE — Telephone Encounter (Signed)
Reports she is wearing a loop recorder implanted by Dr. Rayann Heman. She reports that last night she woke up and her monitor was showing a blue light and there was an arrow pointing to a black button. Pt reports that she read that if her machine had a blue light she was supposed to call the number that it showed but pt reported there was no number to call. Pt would like a call back from either someone with the device clinic or Dr.Allred's office to discuss this. She has an appt at 1:15pm with another provider so she would like to be called before 20 minutes til 1:00pm. Will send this message to the device clinic and Dr.Allred. no other questions from pt at this time.

## 2020-04-25 NOTE — Telephone Encounter (Signed)
She can cut back to one dose of B 12 a day. She is to see her PCP for referral to hematology if they think it is best.   Craig Staggers

## 2020-04-25 NOTE — Telephone Encounter (Signed)
Follow up   Pt is returning call    

## 2020-04-25 NOTE — Telephone Encounter (Signed)
Called and reviewed lab results with Jenna [name]. Notified she is to follow up with her hematology/oncology regarding her anemia. Jenna [name] reports she does not have an oncologist or a hematologist. Jenna [name] would also like to know what to do with her elevated B12. She reports she was taking 250mg  twice a day and didn't know if she needed to cut back or stop. Notified I would send this message to Jory Sims NP for review.

## 2020-04-27 ENCOUNTER — Telehealth: Payer: Self-pay | Admitting: Adult Health

## 2020-04-27 NOTE — Telephone Encounter (Signed)
Spoke with pt, pt stated she will shred labs slip I advised pt I will mailed out correct lab result to her, pt voice understanding and thanks

## 2020-04-27 NOTE — Telephone Encounter (Signed)
Patient states she received lab work for a different patient instead of her own. The patient's name is Jenna [name]. She is 65 and lives on [address]. Patient states she still needs her lab work.

## 2020-05-08 ENCOUNTER — Ambulatory Visit (INDEPENDENT_AMBULATORY_CARE_PROVIDER_SITE_OTHER): Payer: Medicare PPO | Admitting: *Deleted

## 2020-05-08 DIAGNOSIS — I4819 Other persistent atrial fibrillation: Secondary | ICD-10-CM

## 2020-05-08 LAB — CUP PACEART REMOTE DEVICE CHECK
Date Time Interrogation Session: 20210731001520
Implantable Pulse Generator Implant Date: 20210322

## 2020-05-11 NOTE — Progress Notes (Signed)
[facility] Summary Report / Loop Recorder 

## 2020-05-16 ENCOUNTER — Telehealth: Payer: Self-pay | Admitting: Internal Medicine

## 2020-05-16 NOTE — Telephone Encounter (Signed)
Labs from 04/21/2020 faxed via Epic to Dr. Penelope Coop @ # provided  Patient aware

## 2020-05-16 NOTE — Telephone Encounter (Signed)
Jenna [name] is calling stating Dr. Penelope Coop is requesting her last lab results be faxed to their office. The fax number is [phone]. Please advise.

## 2020-06-01 ENCOUNTER — Encounter: Payer: Self-pay | Admitting: Physician Assistant

## 2020-06-01 ENCOUNTER — Other Ambulatory Visit: Payer: Self-pay

## 2020-06-01 ENCOUNTER — Ambulatory Visit: Payer: Medicare PPO | Admitting: Physician Assistant

## 2020-06-01 VITALS — BP 116/62 | HR 99 | Ht 66.0 in | Wt 191.4 lb

## 2020-06-01 DIAGNOSIS — I350 Nonrheumatic aortic (valve) stenosis: Secondary | ICD-10-CM

## 2020-06-01 DIAGNOSIS — I4821 Permanent atrial fibrillation: Secondary | ICD-10-CM

## 2020-06-01 DIAGNOSIS — I428 Other cardiomyopathies: Secondary | ICD-10-CM

## 2020-06-01 DIAGNOSIS — D649 Anemia, unspecified: Secondary | ICD-10-CM

## 2020-06-01 DIAGNOSIS — I10 Essential (primary) hypertension: Secondary | ICD-10-CM

## 2020-06-01 NOTE — Patient Instructions (Signed)
Medication Instructions:  No Changes In Medications at this time.   *If you need a refill on your cardiac medications before your next appointment, please call your pharmacy*   Lab Work: None Ordered At This Time.   If you have labs (blood work) drawn today and your tests are completely normal, you will receive your results only by: [name] MyChart Message (if you have MyChart) OR . A paper copy in the mail If you have any lab test that is abnormal or we need to change your treatment, we will call you to review the results.   Testing/Procedures: None Ordered At This Time.     Follow-Up: At [facility], you and your health needs are our priority.  As part of our continuing mission to provide you with exceptional heart care, we have created designated Provider Care Teams.  These Care Teams include your primary Cardiologist (physician) and Advanced Practice Providers (APPs -  Physician Assistants and Nurse Practitioners) who all work together to provide you with the care you need, when you need it.  We recommend signing up for the patient portal called "MyChart".  Sign up information is provided on this After Visit Summary.  MyChart is used to connect with patients for Virtual Visits (Telemedicine).  Patients are able to view lab/test results, encounter notes, upcoming appointments, etc.  Non-urgent messages can be sent to your provider as well.   To learn more about what you can do with MyChart, go to [email or web address].    Your next appointment:   3 month(s)  The format for your next appointment:   In Person  Provider:   K. Mali Hilty, MD

## 2020-06-01 NOTE — Progress Notes (Addendum)
Cardiology Office Note  Date:  06/01/2020   ID:  Jenna, [name] 03-Oct-1941, MRN [id number]  PCP:  Chesley Noon, MD  Cardiologist:  Dr. Debara Pickett _____________  Chief Complaint  Patient presents with  . Follow-up    1 month f/u    _____________   History of Present Illness: Jenna [name] is a 79 y.o. female who presents today for cardiology evaluation.  She has a history of chornic systolic CHF, HLD, GERD, HTN, hypothyroidism, afib, mod AS, OSA on CPAP, negative nuclear stress test in August 2013. She has a history of afib followed by the Afib clinic and seen by Dr. Rayann Heman. She is s/p ILR. Also has Mod AS with normal LV function.  She was in the ED 04/16/20 for weakness and fatigue found to be anemic, Hgb down to 10.3 from 11.3. She had a syncopal episode at church. Denied any overt bleeding.   She was last seen 04/20/20 virtual visit reporting lower leg edema and labile pressures. She was taking alternating doses of lasix daily. It was changed to 20 mg lasix daily with extra 20 as needed. Recommended low salt diet and leg elevation. Also taking coreg and spironolactone.  Today, she reports she is overall doing well. She had labs done by her PCP showing high potassium so this was stopped. Recent labs 8/9 showed K+ 4.4. She has been taking lasix 20 mg daily and spiro 25mg  daily. Weights stable and no lower leg edema. She has been seeing hematology and GI for anemia, also taking iron daily. Recent hemoglobin 9.9. Denies overt bleeding. Feels cold at times. She is compliant with CPAP. No orthopnea or PND. No chest pain or sob. She lives by herself and is able to performs ADLs. Daughter calls to check on her every night. BP at home have been around 119/70 with good heart rates. She has rare palpitations. The patient is tolerating medications without difficulties and is otherwise without complaint today.   Echocardiogram 02/08/2020 1. Left ventricular ejection fraction, by estimation, is  60 to 65%. The  left ventricle has normal function. The left ventricle has no regional  wall motion abnormalities. Left ventricular diastolic parameters are  consistent with Grade I diastolic  dysfunction (impaired relaxation).  2. Right ventricular systolic function is normal. The right ventricular  size is normal. There is normal pulmonary artery systolic pressure.  3. The mitral valve is normal in structure. No evidence of mitral valve  regurgitation. No evidence of mitral stenosis.  4. The aortic valve is tricuspid. Aortic valve regurgitation is mild.  Moderate aortic valve stenosis. Aortic valve mean gradient measures 24.0  mmHg. Aortic valve Vmax measures 3.07 m/s.  5. The inferior vena cava is normal in size with greater than 50%  respiratory variability, suggesting right atrial pressure of 3 mmHg.  _____________   Past Medical History:  Diagnosis Date  . Anxiety   . CHF (congestive heart failure) ([place])   . Chronic lower back pain   . Chronic systolic dysfunction of left ventricle   . Depression   . Dysrhythmia    A-Fib  . Fatigue   . GERD (gastroesophageal reflux disease)   . High cholesterol   . Hypertension   . Hypothyroidism   . Melanoma of shoulder, left ([place])   . Mild aortic stenosis   . Nonischemic cardiomyopathy ([place])   . OSA on CPAP    reports compliance with CPAP  . Osteoarthritis   . Persistent atrial fibrillation with RVR (  [place])    [name] 11/15/2016  . PONV (postoperative nausea and vomiting)   . RLS (restless legs syndrome)   . S/P ablation of atrial flutter/ fib 01/10/17 01/11/2017  . Type II diabetes mellitus (HCC)    no medications   Past Surgical History:  Procedure Laterality Date  . ATRIAL FIBRILLATION ABLATION N/A 01/10/2017   Procedure: Atrial Fibrillation Ablation;  Surgeon: Thompson Grayer, MD;  Location: [place] CV [facility];  Service: Cardiovascular;  Laterality: N/A;  . CARDIOVERSION N/A 11/19/2016   Procedure: CARDIOVERSION;  Surgeon: Lelon Perla, MD;  Location: [facility] ENDOSCOPY;  Service: Cardiovascular;  Laterality: N/A;  . CARDIOVERSION N/A 12/11/2016   Procedure: CARDIOVERSION;  Surgeon: Dorothy Spark, MD;  Location: [facility] ENDOSCOPY;  Service: Cardiovascular;  Laterality: N/A;  . CARDIOVERSION N/A 12/18/2016   Procedure: CARDIOVERSION;  Surgeon: Skeet Latch, MD;  Location: [facility] ENDOSCOPY;  Service: Cardiovascular;  Laterality: N/A;  . CATARACT EXTRACTION W/ INTRAOCULAR LENS  IMPLANT, BILATERAL Bilateral   . DILATION AND CURETTAGE OF UTERUS    . implantable loop recorder placement  12/27/2019   Medtronic Reveal Linq model LNQ 66 (SN RLA [id number]) implanted in office by Dr Rayann Heman  . LEXISCAN MYOVIEW  05/13/2012   No ECG changes. EKG negative for ischemia. No significant ischemia demonstrated.  [name] MELANOMA EXCISION Left ~ 09/1979   "back of my shoulder"  . TEE WITHOUT CARDIOVERSION N/A 11/19/2016   Procedure: TRANSESOPHAGEAL ECHOCARDIOGRAM (TEE);  Surgeon: Lelon Perla, MD;  Location: [facility] ENDOSCOPY;  Service: Cardiovascular;  Laterality: N/A;  need CV, but no anesthesia available  . TEE WITHOUT CARDIOVERSION N/A 01/10/2017   Procedure: TRANSESOPHAGEAL ECHOCARDIOGRAM (TEE);  Surgeon: [name], MD;  Location: [facility] ENDOSCOPY;  Service: Cardiovascular;  Laterality: N/A;  . TOTAL KNEE ARTHROPLASTY Right 07/28/2018   Procedure: RIGHT TOTAL KNEE ARTHROPLASTY;  Surgeon: Mcarthur Rossetti, MD;  Location: [place];  Service: Orthopedics;  Laterality: Right;  . TRANSTHORACIC ECHOCARDIOGRAM  02/18/2012   EF >55%, mild aortic stenosis  . VAGINAL HYSTERECTOMY  1985   _____________  Current Outpatient Medications  Medication Sig Dispense Refill  . acetaminophen (TYLENOL) 500 MG tablet Take 500-1,000 mg by mouth daily.     [name] ALPRAZolam (XANAX) 0.5 MG tablet Take 0.5 mg by mouth 2 (two) times daily.     [name] apixaban (ELIQUIS) 5 MG TABS tablet Take 1 tablet (5 mg total) by mouth 2 (two) times daily. 180 tablet 1  . atorvastatin (LIPITOR)  20 MG tablet Take 1 tablet (20 mg total) by mouth 3 (three) times a week. 36 tablet 3  . buPROPion (WELLBUTRIN XL) 150 MG 24 hr tablet Take 300 mg by mouth daily.     . Calcium Carbonate-Vitamin D (CALCIUM + D PO) Take 1 tablet by mouth 2 (two) times daily.     . carvedilol (COREG) 12.5 MG tablet Take 1 tablet (12.5 mg total) by mouth 2 (two) times daily. 90 tablet 1  . furosemide (LASIX) 20 MG tablet Take 20 mg by mouth daily. Alternating with the 40 MG.     . levothyroxine (SYNTHROID, LEVOTHROID) 88 MCG tablet Take 88 mcg by mouth daily before breakfast.    . Magnesium 500 MG TABS Take 500 mg by mouth daily.     . Multiple Vitamins-Minerals (PRESERVISION AREDS 2) CAPS Take 1 capsule by mouth 2 (two) times daily.    [name] omeprazole (PRILOSEC) 40 MG capsule Take 40 mg by mouth daily.    [name] ULTRA test strip     .  Polyethyl Glycol-Propyl Glycol (SYSTANE) 0.4-0.3 % SOLN Place 2 drops into both eyes 4 (four) times daily.    [name] pyridoxine (B-6) 100 MG tablet Take 100 mg by mouth daily with supper.     [name] rOPINIRole (REQUIP) 0.5 MG tablet Take 0.5 mg by mouth 4 (four) times daily.     [name] spironolactone (ALDACTONE) 50 MG tablet Take 0.5 tablets by mouth daily.     . vitamin B-12 (CYANOCOBALAMIN) 1000 MCG tablet Take 1,000 mcg by mouth daily.    [name] zinc gluconate 50 MG tablet Take 100 mg by mouth in the morning.    . potassium chloride SA (KLOR-CON M20) 20 MEQ tablet Take 2 tablets (40 mEq total) by mouth every other day. (Patient not taking: Reported on 06/01/2020) 180 tablet 3   No current facility-administered medications for this visit.   _____________   Allergies:   Amoxicillin, Crestor [rosuvastatin], and Lovastatin  _____________   Social History:  The patient  reports that she has never smoked. She has never used smokeless tobacco. She reports that she does not drink alcohol and does not use drugs.  _____________   Family History:  The patient's family history includes Arrhythmia in her  sister; Cancer in her paternal grandfather; Diabetes in her mother; Healthy in her brother and daughter; Heart failure in her father; Hyperlipidemia in her sister and son; Hypertension in her mother, sister, and son; Stroke in her paternal grandmother.  _____________   ROS:  Please see the history of present illness.   Positive for rare palpitations,   All other systems are reviewed and negative.  _____________   PHYSICAL EXAM: VS:  BP 116/62   Pulse 99   Ht 5\' 6"  (1.676 m)   Wt 191 lb 6.4 oz (86.8 kg)   BMI 30.89 kg/m  , BMI Body mass index is 30.89 kg/m. GEN: Well nourished, well developed, in no acute distress  HEENT: normal  Neck: no JVD, carotid bruits, or masses Cardiac: RRR; + murmurs, no rubs, or gallops. No clubbing, cyanosis, edema.  Radials/DP/PT 2+ and equal bilaterally.  Respiratory:  clear to auscultation bilaterally, normal work of breathing GI: soft, nontender, nondistended, + BS MS: no deformity or atrophy  Skin: warm and dry, no rash Neuro:  Strength and sensation are intact Psych: euthymic mood, full affect _____________  EKG:   NO EKG  Recent Labs: 04/16/2020: B Natriuretic Peptide 109.6; BUN 26; Creatinine, Ser 1.03; Hemoglobin 10.3; Platelets 486; Potassium 4.3; Sodium 137  No results found for requested labs within last 8760 hours.  CrCl cannot be calculated (Patient's most recent lab result is older than the maximum 21 days allowed.).  Wt Readings from Last 3 Encounters:  06/01/20 191 lb 6.4 oz (86.8 kg)  04/20/20 188 lb 12.8 oz (85.6 kg)  03/16/20 189 lb (85.7 kg)    _____________   ASSESSMENT AND PLAN:  Atrial fibrillation - Coreg 12.5mg  BID for rate control. Rates relatively controlled, HR today 99 although patient says this is high.  - Eliquis 5mg  BID for stroke prevention, CADSVASC of 5 - follows with Afib clinic - s/p ILR device followed by Dr. Rayann Heman - In sinus on exam  HTN - pressures stable on coreg and spironolactone and lasix 20 mg  daily - BP today 116/62  Anemia - This is being worked up by GI and hematology. Recent Hgb 9.9. She is taking iron daily. Denies overt bleeding.   Mod AS/mild AI - per echo  Mean gradient 44mmHg  -  plan for repeat echo in a year or per Dr. Debara Pickett recs  NICM - tachy-mediated CM with EF down to 20-25%, improved to 60-65% - Most recent echo 08/2019 LVEF 60-65%, G1DD - Lasix 20 mg daily - spiro 25 mg daily - Coreg 12.5mg  BID - potassium stopped per PCP for hyperkalemia. Labs 05/15/20 K+ 4.4 - Euvolemic on exam   Disposition:   FU with Dr. Debara Pickett 3 months   Signed, Sabatino Williard Ninfa Meeker, NP 06/01/2020 3:37 PM    _____________ [facility] [address] [place] [address]  [phone] (office) [phone] (fax)

## 2020-06-08 ENCOUNTER — Ambulatory Visit (INDEPENDENT_AMBULATORY_CARE_PROVIDER_SITE_OTHER): Payer: Medicare PPO | Admitting: *Deleted

## 2020-06-08 DIAGNOSIS — I4821 Permanent atrial fibrillation: Secondary | ICD-10-CM

## 2020-06-08 LAB — CUP PACEART REMOTE DEVICE CHECK
Date Time Interrogation Session: 20210902002159
Implantable Pulse Generator Implant Date: 20210322

## 2020-06-09 NOTE — Progress Notes (Signed)
[facility] Summary Report / Loop Recorder 

## 2020-06-28 ENCOUNTER — Other Ambulatory Visit: Payer: Self-pay

## 2020-06-28 ENCOUNTER — Ambulatory Visit: Payer: Medicare PPO | Admitting: Internal Medicine

## 2020-06-28 ENCOUNTER — Encounter: Payer: Self-pay | Admitting: Internal Medicine

## 2020-06-28 VITALS — BP 118/92 | HR 74 | Ht 66.0 in | Wt 198.0 lb

## 2020-06-28 DIAGNOSIS — D6869 Other thrombophilia: Secondary | ICD-10-CM

## 2020-06-28 DIAGNOSIS — I4819 Other persistent atrial fibrillation: Secondary | ICD-10-CM | POA: Diagnosis not present

## 2020-06-28 DIAGNOSIS — D649 Anemia, unspecified: Secondary | ICD-10-CM | POA: Diagnosis not present

## 2020-06-28 DIAGNOSIS — I10 Essential (primary) hypertension: Secondary | ICD-10-CM | POA: Diagnosis not present

## 2020-06-28 LAB — CUP PACEART INCLINIC DEVICE CHECK
Date Time Interrogation Session: 20210922160802
Implantable Pulse Generator Implant Date: 20210322

## 2020-06-28 NOTE — Patient Instructions (Addendum)
Medication Instructions:  Your physician recommends that you continue on your current medications as directed. Please refer to the Current Medication list given to you today.  *If you need a refill on your cardiac medications before your next appointment, please call your pharmacy*  Lab Work: None ordered.  If you have labs (blood work) drawn today and your tests are completely normal, you will receive your results only by: [name] MyChart Message (if you have MyChart) OR . A paper copy in the mail If you have any lab test that is abnormal or we need to change your treatment, we will call you to review the results.  Testing/Procedures: None ordered.  Follow-Up: At [facility], you and your health needs are our priority.  As part of our continuing mission to provide you with exceptional heart care, we have created designated Provider Care Teams.  These Care Teams include your primary Cardiologist (physician) and Advanced Practice Providers (APPs -  Physician Assistants and Nurse Practitioners) who all work together to provide you with the care you need, when you need it.  We recommend signing up for the patient portal called "MyChart".  Sign up information is provided on this After Visit Summary.  MyChart is used to connect with patients for Virtual Visits (Telemedicine).  Patients are able to view lab/test results, encounter notes, upcoming appointments, etc.  Non-urgent messages can be sent to your provider as well.   To learn more about what you can do with MyChart, go to [email or web address].    Your next appointment:   Your physician wants you to follow-up in: 07/02/2021 at 2 pm with Dr. Rayann Heman.    Other Instructions:

## 2020-06-28 NOTE — Progress Notes (Signed)
PCP: Chesley Noon, MD Primary Cardiologist: Dr Debara Pickett Primary EP: Dr Erskine Speed Jenna [name] is a 79 y.o. female who presents today for routine electrophysiology followup.  Since last being seen in our clinic, the patient reports doing very well.  No afib events.  She has primarily had issues with anemia.   Today, she denies symptoms of palpitations, chest pain, shortness of breath,  lower extremity edema, dizziness, presyncope, or syncope.  The patient is otherwise without complaint today.   Past Medical History:  Diagnosis Date  . Anxiety   . CHF (congestive heart failure) ([place])   . Chronic lower back pain   . Chronic systolic dysfunction of left ventricle   . Depression   . Dysrhythmia    A-Fib  . Fatigue   . GERD (gastroesophageal reflux disease)   . High cholesterol   . Hypertension   . Hypothyroidism   . Melanoma of shoulder, left ([place])   . Mild aortic stenosis   . Nonischemic cardiomyopathy ([place])   . OSA on CPAP    reports compliance with CPAP  . Osteoarthritis   . Persistent atrial fibrillation with RVR ([place])    [name] 11/15/2016  . PONV (postoperative nausea and vomiting)   . RLS (restless legs syndrome)   . S/P ablation of atrial flutter/ fib 01/10/17 01/11/2017  . Type II diabetes mellitus (HCC)    no medications   Past Surgical History:  Procedure Laterality Date  . ATRIAL FIBRILLATION ABLATION N/A 01/10/2017   Procedure: Atrial Fibrillation Ablation;  Surgeon: Thompson Grayer, MD;  Location: [place] CV LAB;  Service: Cardiovascular;  Laterality: N/A;  . CARDIOVERSION N/A 11/19/2016   Procedure: CARDIOVERSION;  Surgeon: Lelon Perla, MD;  Location: [facility] ENDOSCOPY;  Service: Cardiovascular;  Laterality: N/A;  . CARDIOVERSION N/A 12/11/2016   Procedure: CARDIOVERSION;  Surgeon: Dorothy Spark, MD;  Location: [facility] ENDOSCOPY;  Service: Cardiovascular;  Laterality: N/A;  . CARDIOVERSION N/A 12/18/2016   Procedure: CARDIOVERSION;  Surgeon: Skeet Latch, MD;   Location: [facility] ENDOSCOPY;  Service: Cardiovascular;  Laterality: N/A;  . CATARACT EXTRACTION W/ INTRAOCULAR LENS  IMPLANT, BILATERAL Bilateral   . DILATION AND CURETTAGE OF UTERUS    . implantable loop recorder placement  12/27/2019   Medtronic Reveal Linq model LNQ 87 (SN RLA [id number]) implanted in office by Dr Rayann Heman  . LEXISCAN MYOVIEW  05/13/2012   No ECG changes. EKG negative for ischemia. No significant ischemia demonstrated.  [name] MELANOMA EXCISION Left ~ 09/1979   "back of my shoulder"  . TEE WITHOUT CARDIOVERSION N/A 11/19/2016   Procedure: TRANSESOPHAGEAL ECHOCARDIOGRAM (TEE);  Surgeon: Lelon Perla, MD;  Location: [facility] ENDOSCOPY;  Service: Cardiovascular;  Laterality: N/A;  need CV, but no anesthesia available  . TEE WITHOUT CARDIOVERSION N/A 01/10/2017   Procedure: TRANSESOPHAGEAL ECHOCARDIOGRAM (TEE);  Surgeon: [name], MD;  Location: [facility] ENDOSCOPY;  Service: Cardiovascular;  Laterality: N/A;  . TOTAL KNEE ARTHROPLASTY Right 07/28/2018   Procedure: RIGHT TOTAL KNEE ARTHROPLASTY;  Surgeon: Mcarthur Rossetti, MD;  Location: [place];  Service: Orthopedics;  Laterality: Right;  . TRANSTHORACIC ECHOCARDIOGRAM  02/18/2012   EF >55%, mild aortic stenosis  . VAGINAL HYSTERECTOMY  1985    ROS- all systems are reviewed and negatives except as per HPI above  Current Outpatient Medications  Medication Sig Dispense Refill  . acetaminophen (TYLENOL) 500 MG tablet Take 500-1,000 mg by mouth daily.     [name] ALPRAZolam (XANAX) 0.5 MG tablet Take 0.5 mg by mouth  2 (two) times daily.     [name] apixaban (ELIQUIS) 5 MG TABS tablet Take 1 tablet (5 mg total) by mouth 2 (two) times daily. 180 tablet 1  . atorvastatin (LIPITOR) 20 MG tablet Take 1 tablet (20 mg total) by mouth 3 (three) times a week. 36 tablet 3  . buPROPion (WELLBUTRIN XL) 150 MG 24 hr tablet Take 300 mg by mouth daily.     . Calcium Carbonate-Vitamin D (CALCIUM + D PO) Take 1 tablet by mouth 2 (two) times daily.     . carvedilol  (COREG) 12.5 MG tablet Take 1 tablet (12.5 mg total) by mouth 2 (two) times daily. 90 tablet 1  . furosemide (LASIX) 20 MG tablet Take 20 mg by mouth daily. Alternating with the 40 MG.     . levothyroxine (SYNTHROID, LEVOTHROID) 88 MCG tablet Take 88 mcg by mouth daily before breakfast.    . Magnesium 500 MG TABS Take 500 mg by mouth daily.     . Multiple Vitamins-Minerals (PRESERVISION AREDS 2) CAPS Take 1 capsule by mouth 2 (two) times daily.    [name] omeprazole (PRILOSEC) 40 MG capsule Take 40 mg by mouth daily.    [name] ULTRA test strip     . Polyethyl Glycol-Propyl Glycol (SYSTANE) 0.4-0.3 % SOLN Place 2 drops into both eyes 4 (four) times daily.    [name] pyridoxine (B-6) 100 MG tablet Take 100 mg by mouth daily with supper.     [name] rOPINIRole (REQUIP) 0.5 MG tablet Take 0.5 mg by mouth 4 (four) times daily.     [name] spironolactone (ALDACTONE) 50 MG tablet Take 0.5 tablets by mouth daily.      No current facility-administered medications for this visit.    Physical Exam: Vitals:   06/28/20 1556  BP: (!) 118/92  Pulse: 74  SpO2: 97%  Weight: 198 lb (89.8 kg)  Height: 5\' 6"  (1.676 m)    GEN- The patient is elderly appearing, alert and oriented x 3 today.   Head- normocephalic, atraumatic Eyes-  Sclera clear, conjunctiva pink Ears- hearing intact Oropharynx- clear Lungs-   normal work of breathing Heart- Regular rate and rhythm  GI- soft  Extremities- no clubbing, cyanosis, or edema  Wt Readings from Last 3 Encounters:  06/28/20 198 lb (89.8 kg)  06/01/20 191 lb 6.4 oz (86.8 kg)  04/20/20 188 lb 12.8 oz (85.6 kg)    Assessment and Plan:  1. persistent afib She has done well post ablation ILR reveals afib burden of 0%! chads2vasc score is 5.  She is on eliquis I worry about her worsening anemia. In the setting of an AF burden of 0%, we may need to think about stopping her [facility] therapy.   We discussed at length today.  For now, we will continue eliquis.  If she has worsening  bleeding or anemia, we will consider stopping eliquis and then monitoring her ILR closely, restarting eliquis with any further AF seen.  2. HTN Stable No change required today  3. Tachycardia mediated CM Resolved with sinus  4. OSA Compliance with CPAP advised  5. Anemia (iron deficiency) As above  Risks, benefits and potential toxicities for medications prescribed and/or refilled reviewed with patient today.   Return in a year  Thompson Grayer MD, [facility] 06/28/2020 4:12 PM

## 2020-07-11 ENCOUNTER — Ambulatory Visit (INDEPENDENT_AMBULATORY_CARE_PROVIDER_SITE_OTHER): Payer: Medicare PPO

## 2020-07-11 DIAGNOSIS — I4819 Other persistent atrial fibrillation: Secondary | ICD-10-CM | POA: Diagnosis not present

## 2020-07-11 LAB — CUP PACEART REMOTE DEVICE CHECK
Date Time Interrogation Session: 20211005002725
Implantable Pulse Generator Implant Date: 20210322

## 2020-07-14 NOTE — Progress Notes (Signed)
[facility] Summary Report / Loop Recorder 

## 2020-07-19 ENCOUNTER — Encounter: Payer: Self-pay | Admitting: Physician Assistant

## 2020-07-19 ENCOUNTER — Ambulatory Visit: Payer: Self-pay

## 2020-07-19 ENCOUNTER — Ambulatory Visit: Payer: Medicare PPO | Admitting: Physician Assistant

## 2020-07-19 DIAGNOSIS — M25562 Pain in left knee: Secondary | ICD-10-CM | POA: Diagnosis not present

## 2020-07-19 DIAGNOSIS — G8929 Other chronic pain: Secondary | ICD-10-CM

## 2020-07-19 DIAGNOSIS — M1712 Unilateral primary osteoarthritis, left knee: Secondary | ICD-10-CM

## 2020-07-19 DIAGNOSIS — M47819 Spondylosis without myelopathy or radiculopathy, site unspecified: Secondary | ICD-10-CM | POA: Insufficient documentation

## 2020-07-19 NOTE — Progress Notes (Signed)
Office Visit Note   Patient: Jenna [name]           Date of Birth: [birth year]           MRN: [id number] Visit Date: 07/19/2020              Requested by: Chesley Noon, MD [place],  [place] [address] PCP: Chesley Noon, MD   Assessment & Plan: Visit Diagnoses:  1. Chronic pain of left knee   2. Primary osteoarthritis of left knee     Plan: Discussed with the patient aspirating the knee and injecting the knee.  However she just recently had a flu vaccine this past Monday would recommend at least waiting 10 days prior to injecting the knee with corticosteroid.  Therefore at her request we aspirated the knee today and obtained 10 cc of synovial fluid patient tolerated well.  We will see her back next Thursday and at that time inject the knee with a steroid.  Questions were encouraged and answered  Follow-Up Instructions: Return in about 1 week (around 07/26/2020).   Orders:  Orders Placed This Encounter  Procedures  . XR Knee 1-2 Views Left   No orders of the defined types were placed in this encounter.     Procedures: No procedures performed   Clinical Data: No additional findings.   Subjective: Chief Complaint  Patient presents with  . Left Knee - Pain    HPI Mrs. Dohn returns today due to left knee pain notes been ongoing for the last 2 months.  She was last seen 07/15/2019 with of her right total arthroplasty.  Also at that time she was having some pain in her left knee.  She comes in today due to left knee pain worse over the last 2 months.  Worse with steps and getting up and down from seated position.  She notes grinding popping in the knee.  She has known osteoarthritis of the left knee.  Currently she is dealing with work-up of A. fib is wearing a loop recorder.  Review of Systems  Constitutional: Negative for chills and fever.     Objective: Vital Signs: There were no vitals taken for this visit.  Physical Exam Constitutional:       Appearance: She is not ill-appearing or diaphoretic.  Pulmonary:     Effort: Pulmonary effort is normal.  Neurological:     Mental Status: She is alert and oriented to person, place, and time.  Psychiatric:        Mood and Affect: Mood normal.     Ortho Exam Left knee significant crepitus patellofemoral region with passive range of motion.  She has full range of motion of the knee actively.  No abnormal warmth or erythema.  Slight effusion.  Tenderness along medial joint line.  No instability. Specialty Comments:  No specialty comments available.  Imaging: XR Knee 1-2 Views Left  Result Date: 07/19/2020 AP and lateral views left knee: No acute fractures.  Severe end-stage patellofemoral joint disease.  Moderate medial joint line narrowing.  Knee is well located.    PMFS History: Patient Active Problem List   Diagnosis Date Noted  . Facet arthropathy 07/19/2020  . Spondylosis without myelopathy or radiculopathy, lumbar region 12/07/2019  . Chronic back pain 10/28/2019  . Chronic kidney disease (CKD), stage III (moderate) ([place]) 09/21/2019  . Moderate episode of recurrent major depressive disorder ([place]) 09/21/2019  . Primary insomnia 05/31/2019  . Hyperlipidemia 03/03/2019  .  Peripheral edema 11/03/2018  . Status post total knee replacement, right 07/28/2018  . Primary osteoarthritis of right knee 12/08/2017  . Nonischemic cardiomyopathy ([place]) 05/14/2017  . S/P ablation of atrial flutter/ fib 01/10/17 01/11/2017  . A-fib ([place]) 01/10/2017  . Weakness 12/17/2016  . Acute kidney injury superimposed on chronic kidney disease ([place]) 12/17/2016  . Hyperglycemia 12/17/2016  . Normocytic anemia 12/17/2016  . Acute kidney injury ([place]) 12/08/2016  . Acute on chronic systolic congestive heart failure ([place]) 12/08/2016  . Hyperkalemia 12/08/2016  . Cystitis   . Persistent atrial fibrillation ([place]) 11/15/2016  . Depressed 07/04/2015  . Peripheral vertigo 12/16/2014  . Vertigo  12/16/2014  . Aortic valve stenosis 07/26/2013  . HTN (hypertension) 07/26/2013  . Hypothyroidism 07/26/2013  . PAF (paroxysmal atrial fibrillation) ([place]) 07/26/2013  . Anxiety 07/26/2013  . Aortic valve disorder 07/26/2013  . GERD (gastroesophageal reflux disease) 07/26/2013  . OA (osteoarthritis) 07/26/2013  . Obstructive sleep apnea 06/20/2008  . RESTLESS LEGS SYNDROME 06/20/2008   Past Medical History:  Diagnosis Date  . Anxiety   . CHF (congestive heart failure) ([place])   . Chronic lower back pain   . Chronic systolic dysfunction of left ventricle   . Depression   . Dysrhythmia    A-Fib  . Fatigue   . GERD (gastroesophageal reflux disease)   . High cholesterol   . Hypertension   . Hypothyroidism   . Melanoma of shoulder, left ([place])   . Mild aortic stenosis   . Nonischemic cardiomyopathy ([place])   . OSA on CPAP    reports compliance with CPAP  . Osteoarthritis   . Persistent atrial fibrillation with RVR ([place])    [name] 11/15/2016  . PONV (postoperative nausea and vomiting)   . RLS (restless legs syndrome)   . S/P ablation of atrial flutter/ fib 01/10/17 01/11/2017  . Type II diabetes mellitus (HCC)    no medications    Family History  Problem Relation Age of Onset  . Hypertension Mother   . Diabetes Mother   . Heart failure Father   . Arrhythmia Sister   . Hyperlipidemia Sister   . Hypertension Sister   . Healthy Brother   . Stroke Paternal Grandmother   . Cancer Paternal Grandfather   . Healthy Daughter   . Hypertension Son   . Hyperlipidemia Son     Past Surgical History:  Procedure Laterality Date  . ATRIAL FIBRILLATION ABLATION N/A 01/10/2017   Procedure: Atrial Fibrillation Ablation;  Surgeon: Thompson Grayer, MD;  Location: [place] [facility];  Service: Cardiovascular;  Laterality: N/A;  . CARDIOVERSION N/A 11/19/2016   Procedure: CARDIOVERSION;  Surgeon: Lelon Perla, MD;  Location: [facility] ENDOSCOPY;  Service: Cardiovascular;  Laterality: N/A;  . CARDIOVERSION  N/A 12/11/2016   Procedure: CARDIOVERSION;  Surgeon: Dorothy Spark, MD;  Location: [facility] ENDOSCOPY;  Service: Cardiovascular;  Laterality: N/A;  . CARDIOVERSION N/A 12/18/2016   Procedure: CARDIOVERSION;  Surgeon: Skeet Latch, MD;  Location: [facility] ENDOSCOPY;  Service: Cardiovascular;  Laterality: N/A;  . CATARACT EXTRACTION W/ INTRAOCULAR LENS  IMPLANT, BILATERAL Bilateral   . DILATION AND CURETTAGE OF UTERUS    . implantable loop recorder placement  12/27/2019   Medtronic Reveal Linq model LNQ 21 (SN RLA [id number]) implanted in office by Dr Rayann Heman  . LEXISCAN MYOVIEW  05/13/2012   No ECG changes. EKG negative for ischemia. No significant ischemia demonstrated.  [name] MELANOMA EXCISION Left ~ 09/1979   "back of my shoulder"  . TEE WITHOUT CARDIOVERSION N/A  11/19/2016   Procedure: TRANSESOPHAGEAL ECHOCARDIOGRAM (TEE);  Surgeon: Lelon Perla, MD;  Location: [facility] ENDOSCOPY;  Service: Cardiovascular;  Laterality: N/A;  need CV, but no anesthesia available  . TEE WITHOUT CARDIOVERSION N/A 01/10/2017   Procedure: TRANSESOPHAGEAL ECHOCARDIOGRAM (TEE);  Surgeon: [name], MD;  Location: [facility]COPY;  Service: Cardiovascular;  Laterality: N/A;  . TOTAL KNEE ARTHROPLASTY Right 07/28/2018   Procedure: RIGHT TOTAL KNEE ARTHROPLASTY;  Surgeon: Mcarthur Rossetti, MD;  Location: [place];  Service: Orthopedics;  Laterality: Right;  . TRANSTHORACIC ECHOCARDIOGRAM  02/18/2012   EF >55%, mild aortic stenosis  . VAGINAL HYSTERECTOMY  1985   Social History   Occupational History  . Not on file  Tobacco Use  . Smoking status: Never Smoker  . Smokeless tobacco: Never Used  Vaping Use  . Vaping Use: Never used  Substance and Sexual Activity  . Alcohol use: No  . Drug use: Never  . Sexual activity: Never

## 2020-07-27 ENCOUNTER — Encounter: Payer: Self-pay | Admitting: Physician Assistant

## 2020-07-27 ENCOUNTER — Ambulatory Visit (INDEPENDENT_AMBULATORY_CARE_PROVIDER_SITE_OTHER): Payer: Medicare PPO | Admitting: Physician Assistant

## 2020-07-27 DIAGNOSIS — M1712 Unilateral primary osteoarthritis, left knee: Secondary | ICD-10-CM | POA: Diagnosis not present

## 2020-07-27 MED ORDER — LIDOCAINE HCL 1 % IJ SOLN
3.0000 mL | INTRAMUSCULAR | Status: AC | PRN
  Administered 2020-07-27: 3 mL

## 2020-07-27 MED ORDER — METHYLPREDNISOLONE ACETATE 40 MG/ML IJ SUSP
40.0000 mg | INTRAMUSCULAR | Status: AC | PRN
  Administered 2020-07-27: 40 mg via INTRA_ARTICULAR

## 2020-07-27 NOTE — Progress Notes (Signed)
   Procedure Note  Patient: Jenna [name]             Date of Birth: 11-10-1940           MRN: [id number]             Visit Date: 07/27/2020 HPI: Mrs. Karlene Einstein comes in today for scheduled left knee injection.  She said no known injury.  She did have the flu shot 2 weeks ago at this point and had no side effects.  Physical exam: Left knee no abnormal warmth erythema or effusion. Procedures: Visit Diagnoses:  1. Primary osteoarthritis of left knee     Large Joint Inj: L knee on 07/27/2020 2:04 PM Indications: pain Details: 22 G 1.5 in needle, anterolateral approach  Arthrogram: No  Medications: 3 mL lidocaine 1 %; 40 mg methylPREDNISolone acetate 40 MG/ML Outcome: tolerated well, no immediate complications Procedure, treatment alternatives, risks and benefits explained, specific risks discussed. Consent was given by the patient. Immediately prior to procedure a time out was called to verify the correct patient, procedure, equipment, support staff and site/side marked as required. Patient was prepped and draped in the usual sterile fashion.     Plan: She will follow up with [place] as needed.  She understands to wait least 3 months between cortisone injections.  Questions were encouraged and answered

## 2020-07-31 ENCOUNTER — Other Ambulatory Visit: Payer: Self-pay | Admitting: Internal Medicine

## 2020-08-13 LAB — CUP PACEART REMOTE DEVICE CHECK
Date Time Interrogation Session: 20211107005101
Implantable Pulse Generator Implant Date: 20210322

## 2020-08-14 ENCOUNTER — Ambulatory Visit (INDEPENDENT_AMBULATORY_CARE_PROVIDER_SITE_OTHER): Payer: Medicare PPO

## 2020-08-14 DIAGNOSIS — I4819 Other persistent atrial fibrillation: Secondary | ICD-10-CM

## 2020-08-15 NOTE — Progress Notes (Signed)
[facility] Summary Report / Loop Recorder 

## 2020-09-05 ENCOUNTER — Other Ambulatory Visit: Payer: Self-pay

## 2020-09-05 ENCOUNTER — Encounter: Payer: Self-pay | Admitting: Internal Medicine

## 2020-09-05 ENCOUNTER — Ambulatory Visit: Payer: Medicare PPO | Admitting: Internal Medicine

## 2020-09-05 VITALS — BP 125/73 | HR 95 | Temp 97.0°F | Ht 66.0 in | Wt 192.0 lb

## 2020-09-05 DIAGNOSIS — I350 Nonrheumatic aortic (valve) stenosis: Secondary | ICD-10-CM

## 2020-09-05 DIAGNOSIS — I10 Essential (primary) hypertension: Secondary | ICD-10-CM | POA: Diagnosis not present

## 2020-09-05 DIAGNOSIS — I480 Paroxysmal atrial fibrillation: Secondary | ICD-10-CM | POA: Diagnosis not present

## 2020-09-05 DIAGNOSIS — Z9889 Other specified postprocedural states: Secondary | ICD-10-CM

## 2020-09-05 DIAGNOSIS — Z8679 Personal history of other diseases of the circulatory system: Secondary | ICD-10-CM

## 2020-09-05 NOTE — Patient Instructions (Signed)
Medication Instructions:  Your physician recommends that you continue on your current medications as directed. Please refer to the Current Medication list given to you today.  *If you need a refill on your cardiac medications before your next appointment, please call your pharmacy*  Testing/Procedures: Your physician has requested that you have an echocardiogram. Echocardiography is a painless test that uses sound waves to create images of your heart. It provides your doctor with information about the size and shape of your heart and how well your heart's chambers and valves are working. This procedure takes approximately one hour. There are no restrictions for this procedure. -- due in 6 months   Follow-Up: At [facility], you and your health needs are our priority.  As part of our continuing mission to provide you with exceptional heart care, we have created designated Provider Care Teams.  These Care Teams include your primary Cardiologist (physician) and Advanced Practice Providers (APPs -  Physician Assistants and Nurse Practitioners) who all work together to provide you with the care you need, when you need it.  We recommend signing up for the patient portal called "MyChart".  Sign up information is provided on this After Visit Summary.  MyChart is used to connect with patients for Virtual Visits (Telemedicine).  Patients are able to view lab/test results, encounter notes, upcoming appointments, etc.  Non-urgent messages can be sent to your provider as well.   To learn more about what you can do with MyChart, go to [email or web address].    Your next appointment:   6 month(s)  The format for your next appointment:   In Person  Provider:   You may see [name], MD or one of the following Advanced Practice Providers on your designated Care Team:    Almyra Deforest, PA-C  Fabian Sharp, PA-C or   Roby Lofts, [place]    Other Instructions

## 2020-09-05 NOTE — Progress Notes (Signed)
OFFICE NOTE  Chief Complaint:   Shortness of breath  Primary Care Physician: Chesley Noon, MD  HPI:  Jenna [name] is a 79 year old female with a history of mild aortic stenosis and a negative nuclear stress test in August 2013. Recently she had an episode of palpitations which she felt lasted up to about 10 minutes. During that time she felt a decrease in exercise tolerance with fatigue and no energy. This was concerning for AFib; however, she wore a monitor for a short period of time and it did not show that.  I set her up for a 30-day monitor, which she worse very faithfully between February 26 and December 31, 2012. The results indicated 1 very brief episode of AFib of less than 10 seconds in duration out of a total of over 40,000 seconds of monitoring. This correlates with a burden of less than 0.003%. At this point, given her low burden I feel that it is still reasonable to treat her with low-dose aspirin. However, we will continue to see her closely and adjust her anticoagulation as necessary. Since her last follow-up she denies any significant palpitations. She has reported some LE swelling, which she thinks is better now that she is off of her ARB?  Jenna [name] returns today for followup. She reports doing fairly well. In August she said she had an episode of age of fibrillation when coming home from the beach. That day she walked up and down the stairs several times and felt her heart racing. It never seemed to slow down. Finally she felt better the next day however she was fatigued and eventually recovered. Since that time she's had no further events. She does get some mild swelling in her legs. She is reporting some fatigue since starting a beta blocker and feels that it may be a side effect.  I saw Jenna [name] back today in the office. She seems to doing fairly well from a cardiac standpoint. She denies any significant palpitations and thinks that the diltiazem is generally controlled  them. If she is having A. fib is very infrequent and a low burden. She is maintained only on aspirin. Unfortunate she's been struggling with depression which occurred after grieving for the death of her husband. She's been apparently on several different antidepressive medicines with mixed results and recently on Wellbutrin which she thinks is somewhat helpful. She did have somewhat of a flat affect in the office today.  07/08/2016  Jenna [name] returns today for follow-up. She is doing generally well and denies any palpitations of significance. EKG today shows sinus rhythm at 72. Her blood pressure is mildly elevated 141/79. Weight is up a few pounds. She continues to struggle with depression related to the death of her husband which is now a couple of years ago.  05/13/2017  Jenna [name] returns today for follow-up. Recently she was hospitalized in March with A. fib with RVR and symptoms of heart failure. Her diuretics were increased and she underwent cardioversion. This was initially successful, however ultimately she went back into A. fib. She was seen by EP and was placed on amiodarone and Ranexa. She was later evaluated and deemed a candidate for ablation. I performed a cardiac transesophageal echocardiogram which demonstrated an LVEF of 20-25% and no evidence of LV thrombus. Subsequently she underwent catheter ablation with successful conversion to sinus rhythm. Since that time she is maintaining sinus rhythm and a repeat echocardiogram on 04/22/2017 demonstrates normalization of LV function to an LVEF of  60-65%. She reports that she's been feeling well. She's been worked with her primary care provider who recently obtained labs and indicated she may be a little dehydrated. She was on high-dose Lasix 80 mg daily daily which was reduced to 40 mg alternating with 80 mg every other day. She denies any worsening shortness of breath or chest pain. Blood pressure is at goal today. Her weight is been stable. She is  compliant with Eliquis for anticoagulation and denies any bleeding problems.  06/22/2018  Jenna [name] returns today for follow-up.  She reports some recent increase in swelling.  She denies any recurrent A. fib.  She was seen in the A. fib clinic in July and had not noted to have any A. fib at the time.  Fortunately her LVEF had normalized after ablation.  She denies any chest pain.  She continues to struggle with hip pain.  She says she has arthritis which is "bone-on-bone".  01/10/2020  Jenna [name] returns today for follow-up.  She has been followed in the A. fib clinic and has had good control of her A. fib after ablation however more recently had worsening palpitations.  She saw Dr. Rayann Heman and just had an implanted loop recorder placed a week ago.  Is a remote pacer check later this month.  In November she had a repeat echocardiogram which showed moderate to severe aortic stenosis with normal LV function.  She does report dyspnea on exertion, particularly walking upstairs or going long distances.  This seems to have worsened somewhat.  Another echo has been ordered for early May 2021.  EKG today shows sinus rhythm at 69.  09/05/2020  Jenna [name] is seen today in follow-up.  She has been seen by number of my physician assistants over the past several months as well as Dr. Rayann Heman back in September.  After ablation she has had no significant recurrent atrial fibrillation.  He even discussed with her about possibly stopping her anticoagulation but she is reluctant to do that.  She does seem somewhat anxious about her cardiac health.  We did an echo in May of this year which showed normal systolic function and moderate aortic stenosis with mild aortic insufficiency.  This is been fairly stable.  Blood pressure is good today.  EKG shows a sinus rhythm with sinus arrhythmia at 95.  PMHx:  Past Medical History:  Diagnosis Date  . Anxiety   . CHF (congestive heart failure) ([place])   . Chronic lower back pain   . Chronic  systolic dysfunction of left ventricle   . Depression   . Dysrhythmia    A-Fib  . Fatigue   . GERD (gastroesophageal reflux disease)   . High cholesterol   . Hypertension   . Hypothyroidism   . Melanoma of shoulder, left ([place])   . Mild aortic stenosis   . Nonischemic cardiomyopathy ([place])   . OSA on CPAP    reports compliance with CPAP  . Osteoarthritis   . Persistent atrial fibrillation with RVR ([place])    Jenna [name] 11/15/2016  . PONV (postoperative nausea and vomiting)   . RLS (restless legs syndrome)   . S/P ablation of atrial flutter/ fib 01/10/17 01/11/2017  . Type II diabetes mellitus (HCC)    no medications    Past Surgical History:  Procedure Laterality Date  . ATRIAL FIBRILLATION ABLATION N/A 01/10/2017   Procedure: Atrial Fibrillation Ablation;  Surgeon: Thompson Grayer, MD;  Location: [place] [facility];  Service: Cardiovascular;  Laterality: N/A;  . CARDIOVERSION N/A  11/19/2016   Procedure: CARDIOVERSION;  Surgeon: Lelon Perla, MD;  Location: [facility] ENDOSCOPY;  Service: Cardiovascular;  Laterality: N/A;  . CARDIOVERSION N/A 12/11/2016   Procedure: CARDIOVERSION;  Surgeon: Dorothy Spark, MD;  Location: [facility] ENDOSCOPY;  Service: Cardiovascular;  Laterality: N/A;  . CARDIOVERSION N/A 12/18/2016   Procedure: CARDIOVERSION;  Surgeon: Skeet Latch, MD;  Location: [facility] ENDOSCOPY;  Service: Cardiovascular;  Laterality: N/A;  . CATARACT EXTRACTION W/ INTRAOCULAR LENS  IMPLANT, BILATERAL Bilateral   . DILATION AND CURETTAGE OF UTERUS    . implantable loop recorder placement  12/27/2019   Medtronic Reveal Linq model LNQ 97 (SN RLA [id number]) implanted in office by Dr Rayann Heman  . LEXISCAN MYOVIEW  05/13/2012   No ECG changes. EKG negative for ischemia. No significant ischemia demonstrated.  [name] MELANOMA EXCISION Left ~ 09/1979   "back of my shoulder"  . TEE WITHOUT CARDIOVERSION N/A 11/19/2016   Procedure: TRANSESOPHAGEAL ECHOCARDIOGRAM (TEE);  Surgeon: Lelon Perla, MD;  Location: [facility]  ENDOSCOPY;  Service: Cardiovascular;  Laterality: N/A;  need CV, but no anesthesia available  . TEE WITHOUT CARDIOVERSION N/A 01/10/2017   Procedure: TRANSESOPHAGEAL ECHOCARDIOGRAM (TEE);  Surgeon: [name], MD;  Location: [facility] ENDOSCOPY;  Service: Cardiovascular;  Laterality: N/A;  . TOTAL KNEE ARTHROPLASTY Right 07/28/2018   Procedure: RIGHT TOTAL KNEE ARTHROPLASTY;  Surgeon: Mcarthur Rossetti, MD;  Location: [place];  Service: Orthopedics;  Laterality: Right;  . TRANSTHORACIC ECHOCARDIOGRAM  02/18/2012   EF >55%, mild aortic stenosis  . VAGINAL HYSTERECTOMY  1985    FAMHx:  Family History  Problem Relation Age of Onset  . Hypertension Mother   . Diabetes Mother   . Heart failure Father   . Arrhythmia Sister   . Hyperlipidemia Sister   . Hypertension Sister   . Healthy Brother   . Stroke Paternal Grandmother   . Cancer Paternal Grandfather   . Healthy Daughter   . Hypertension Son   . Hyperlipidemia Son     SOCHx:   reports that she has never smoked. She has never used smokeless tobacco. She reports that she does not drink alcohol and does not use drugs.  ALLERGIES:  Allergies  Allergen Reactions  . Amoxicillin Nausea Only  . Crestor [Rosuvastatin] Other (See Comments)    Joints ached  . Lovastatin Other (See Comments)    Joints hurt all over body     ROS: Pertinent items noted in HPI and remainder of comprehensive ROS otherwise negative.  HOME MEDS: Current Outpatient Medications  Medication Sig Dispense Refill  . acetaminophen (TYLENOL) 500 MG tablet Take 500-1,000 mg by mouth daily.     [name] ALPRAZolam (XANAX) 0.5 MG tablet Take 0.5 mg by mouth 2 (two) times daily.     [name] atorvastatin (LIPITOR) 20 MG tablet Take 1 tablet (20 mg total) by mouth 3 (three) times a week. 36 tablet 3  . buPROPion (WELLBUTRIN XL) 150 MG 24 hr tablet Take 300 mg by mouth daily.     . Calcium Carbonate-Vitamin D (CALCIUM + D PO) Take 1 tablet by mouth 2 (two) times daily.     .  carvedilol (COREG) 12.5 MG tablet Take 1 tablet (12.5 mg total) by mouth 2 (two) times daily. 90 tablet 1  . ELIQUIS 5 MG TABS tablet TAKE 1 TABLET BY MOUTH TWICE A DAY 180 tablet 1  . ferrous gluconate (FERGON) 324 MG tablet     . furosemide (LASIX) 20 MG tablet Take 20 mg by mouth daily. TOTAL  20 MG.    . levothyroxine (SYNTHROID, LEVOTHROID) 88 MCG tablet Take 88 mcg by mouth daily before breakfast.    . Magnesium 500 MG TABS Take 500 mg by mouth daily.     . Multiple Vitamins-Minerals (PRESERVISION AREDS 2) CAPS Take 1 capsule by mouth 2 (two) times daily.    [name] omeprazole (PRILOSEC) 40 MG capsule Take 40 mg by mouth daily.    Jenna [name] ULTRA test strip     . Polyethyl Glycol-Propyl Glycol (SYSTANE) 0.4-0.3 % SOLN Place 2 drops into both eyes 4 (four) times daily.    [name] pyridoxine (B-6) 100 MG tablet Take 100 mg by mouth daily with supper.     [name] rOPINIRole (REQUIP) 0.5 MG tablet Take 0.5 mg by mouth 4 (four) times daily.     [name] spironolactone (ALDACTONE) 50 MG tablet Take 1 tablet by mouth daily.      No current facility-administered medications for this visit.    LABS/IMAGING: No results found for this or any previous visit (from the past 48 hour(s)). No results found.  VITALS: BP 125/73   Pulse 95   Temp (!) 97 F (36.1 C)   Ht 5\' 6"  (1.676 m)   Wt 192 lb (87.1 kg)   SpO2 96%   BMI 30.99 kg/m   EXAM: General appearance: alert and no distress Neck: no carotid bruit, no JVD and thyroid not enlarged, symmetric, no tenderness/mass/nodules Lungs: clear to auscultation bilaterally Heart: regular rate and rhythm, S1, S2 normal and systolic murmur: early systolic 2/6, crescendo at 2nd right intercostal space Abdomen: soft, non-tender; bowel sounds normal; no masses,  no organomegaly Extremities: extremities normal, atraumatic, no cyanosis or edema Pulses: 2+ and symmetric Skin: Skin color, texture, turgor normal. No rashes or lesions Neurologic: Grossly normal Psych:  Pleasant  EKG: Sinus rhythm with sinus arrhythmia at 95-personally reviewed  ASSESSMENT: 1. Paroxysmal atrial fibrillation, s/p catheter ablation (01/2017) - CHADSVASC-4 on Eliquis, s/p ILR without recurrent afib 2. 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 HTN - controlled 4. Dyslipidemia -on low-dose atorvastatin  5. Moderate to severe aortic stenosis-mean gradient 26 mmHg (08/2019) 6. History of anxiety/depression  PLAN: 1.  Jenna [name] seems stable with moderate aortic stenosis by her last echo.  LVEF is normal.  Blood pressure is controlled and cholesterol is at goal.  She has had no recurrent A. fib after catheter ablation and has an implanted loop recorder.  She remains on Eliquis.  No changes to her meds today.  Plan follow-up in 6 months with an echo or sooner as necessary.  [name], MD, [facility] [place] Director of the Advanced Lipid Disorders &  Cardiovascular Risk Reduction Clinic Diplomate of the American Board of Clinical Lipidology Attending Cardiologist  Direct Dial: [phone]  Fax: [phone]  Website:  [email or web address] 09/05/2020, 2:48 PM

## 2020-09-16 LAB — CUP PACEART REMOTE DEVICE CHECK
Date Time Interrogation Session: 20211210014504
Implantable Pulse Generator Implant Date: 20210322

## 2020-10-17 ENCOUNTER — Ambulatory Visit: Payer: Medicare PPO | Admitting: Adult Health

## 2020-10-19 ENCOUNTER — Encounter: Payer: Self-pay | Admitting: Adult Health

## 2020-10-19 ENCOUNTER — Other Ambulatory Visit: Payer: Self-pay

## 2020-10-19 ENCOUNTER — Ambulatory Visit (INDEPENDENT_AMBULATORY_CARE_PROVIDER_SITE_OTHER): Payer: Medicare PPO | Admitting: Adult Health

## 2020-10-19 DIAGNOSIS — G2581 Restless legs syndrome: Secondary | ICD-10-CM

## 2020-10-19 DIAGNOSIS — G4733 Obstructive sleep apnea (adult) (pediatric): Secondary | ICD-10-CM

## 2020-10-19 NOTE — Patient Instructions (Signed)
Continue on CPAP at bedtime ?Continue to work on healthy weight ?Do not drive if sleepy ?Continue on Requip ?Follow-up with Dr. Alva in 1 year and as needed ?

## 2020-10-19 NOTE — Assessment & Plan Note (Signed)
RLS - continue on current regimen   Plan  Patient Instructions  Continue on CPAP at bedtime Continue to work on healthy weight Do not drive if sleepy Continue on Requip Follow-up with Dr. Elsworth Soho in 1 year and as needed

## 2020-10-19 NOTE — Assessment & Plan Note (Signed)
Good control and compliance  Encouraged to try to wear all night each night , try to get >6hr of sleep   Plan  Patient Instructions  Continue on CPAP at bedtime Continue to work on healthy weight Do not drive if sleepy Continue on Requip Follow-up with Dr. Elsworth Soho in 1 year and as needed

## 2020-10-19 NOTE — Progress Notes (Signed)
@Patient  ID: Jenna [name], female    DOB: 11-Jul-1941, 80 y.o.   MRN: [id number]  Chief Complaint  Patient presents with  . Sleep Apnea    Referring provider: Chesley Noon, MD  HPI: 80 year old female followed for obstructive sleep apnea and restless leg syndrome  TEST/EVENTS :  NPSG 2005: AHI 17/hr, optimal pressure 11cm Auto 2014: Optimal pressure 13cm   10/19/2020 Follow up : OSA And RLS  Patient presents for a 1 year follow-up.  Patient has underlying obstructive sleep apnea.  Is on nocturnal CPAP.  Patient says she is doing pretty well on CPAP.  She tries to wear it each night.  Sometimes she does miss a night.  She usually gets in about 5 hours of sleep.  CPAP download shows 80% compliance.  With daily average usage at 5 hours.  AHI 4.8.  Positive mask leak.  Patient is on CPAP 13 cm H2O.  Patient has restless leg syndrome.  She is on Requip 0.5mg  Four times a day.  She says it is helping to keep her symptoms down.  Covid vaccine plus booster.    Allergies  Allergen Reactions  . Amoxicillin Nausea Only  . Crestor [Rosuvastatin] Other (See Comments)    Joints ached  . Lovastatin Other (See Comments)    Joints hurt all over body     Immunization History  Administered Date(s) Administered  . H1N1 09/12/2008  . Influenza Split 07/22/2011, 07/16/2012, 07/29/2016, 07/18/2017  . Influenza, High Dose Seasonal PF 07/29/2016, 07/20/2017, 07/19/2018, 07/24/2019  . Influenza, Seasonal, Injecte, Preservative Fre 07/18/2017  . Influenza,inj,Quad PF,6+ Mos 07/13/2013, 07/18/2014, 07/24/2015  . Influenza,inj,quad, With Preservative 07/13/2013, 07/18/2014, 07/24/2015  . PFIZER SARS-COV-2 Vaccination 12/06/2019, 01/04/2020  . Pneumococcal Conjugate-13 01/16/2015  . Pneumococcal Polysaccharide-23 09/21/2019  . Tdap 01/16/2015  . Zoster 07/21/2017  . Zoster Recombinat (Shingrix) 03/15/2017, 07/20/2017    Past Medical History:  Diagnosis Date  . Anxiety   . CHF  (congestive heart failure) ([place])   . Chronic lower back pain   . Chronic systolic dysfunction of left ventricle   . Depression   . Dysrhythmia    A-Fib  . Fatigue   . GERD (gastroesophageal reflux disease)   . High cholesterol   . Hypertension   . Hypothyroidism   . Melanoma of shoulder, left ([place])   . Mild aortic stenosis   . Nonischemic cardiomyopathy ([place])   . OSA on CPAP    reports compliance with CPAP  . Osteoarthritis   . Persistent atrial fibrillation with RVR ([place])    [name] 11/15/2016  . PONV (postoperative nausea and vomiting)   . RLS (restless legs syndrome)   . S/P ablation of atrial flutter/ fib 01/10/17 01/11/2017  . Type II diabetes mellitus (HCC)    no medications    Tobacco History: Social History   Tobacco Use  Smoking Status Never Smoker  Smokeless Tobacco Never Used   Counseling given: Not Answered   Outpatient Medications Prior to Visit  Medication Sig Dispense Refill  . acetaminophen (TYLENOL) 500 MG tablet Take 500-1,000 mg by mouth daily.    [name] ALPRAZolam (XANAX) 0.5 MG tablet Take 0.5 mg by mouth 2 (two) times daily.     [name] atorvastatin (LIPITOR) 20 MG tablet Take 1 tablet (20 mg total) by mouth 3 (three) times a week. 36 tablet 3  . buPROPion (WELLBUTRIN XL) 150 MG 24 hr tablet Take 300 mg by mouth daily.     . Calcium Carbonate-Vitamin D (CALCIUM +  D PO) Take 1 tablet by mouth 2 (two) times daily.     . carvedilol (COREG) 12.5 MG tablet Take 1 tablet (12.5 mg total) by mouth 2 (two) times daily. 90 tablet 1  . ELIQUIS 5 MG TABS tablet TAKE 1 TABLET BY MOUTH TWICE A DAY 180 tablet 1  . ferrous gluconate (FERGON) 324 MG tablet     . furosemide (LASIX) 20 MG tablet Take 20 mg by mouth daily. TOTAL 20 MG.    . levothyroxine (SYNTHROID, LEVOTHROID) 88 MCG tablet Take 88 mcg by mouth daily before breakfast.    . Magnesium 500 MG TABS Take 500 mg by mouth daily.     . Multiple Vitamins-Minerals (PRESERVISION AREDS 2) CAPS Take 1 capsule by mouth 2 (two)  times daily.    [name] omeprazole (PRILOSEC) 40 MG capsule Take 40 mg by mouth daily.    [name] ULTRA test strip     . Polyethyl Glycol-Propyl Glycol 0.4-0.3 % SOLN Place 2 drops into both eyes 4 (four) times daily.    [name] pyridoxine (B-6) 100 MG tablet Take 100 mg by mouth daily with supper.     [name] rOPINIRole (REQUIP) 0.5 MG tablet Take 0.5 mg by mouth 4 (four) times daily.     [name] spironolactone (ALDACTONE) 50 MG tablet Take 1 tablet by mouth daily.      No facility-administered medications prior to visit.     Review of Systems:   Constitutional:   No  weight loss, night sweats,  Fevers, chills, fatigue, or  lassitude.  HEENT:   No headaches,  Difficulty swallowing,  Tooth/dental problems, or  Sore throat,                No sneezing, itching, ear ache, nasal congestion, post nasal drip,   CV:  No chest pain,  Orthopnea, PND, swelling in lower extremities, anasarca, dizziness, palpitations, syncope.   GI  No heartburn, indigestion, abdominal pain, nausea, vomiting, diarrhea, change in bowel habits, loss of appetite, bloody stools.   Resp: No shortness of breath with exertion or at rest.  No excess mucus, no productive cough,  No non-productive cough,  No coughing up of blood.  No change in color of mucus.  No wheezing.  No chest wall deformity  Skin: no rash or lesions.  GU: no dysuria, change in color of urine, no urgency or frequency.  No flank pain, no hematuria   MS:  No joint pain or swelling.  No decreased range of motion.  No back pain.    Physical Exam  BP 118/80 (BP Location: Left Arm, Cuff Size: Normal)   Pulse (!) 103   Temp (!) 97.4 F (36.3 C)   Ht 5\' 5"  (1.651 m)   Wt 200 lb 3.2 oz (90.8 kg)   SpO2 97%   BMI 33.32 kg/m   GEN: A/Ox3; pleasant , NAD, well nourished    HEENT:  [place]/AT,  EACs-clear, TMs-wnl, NOSE-clear, THROAT-clear, no lesions, no postnasal drip or exudate noted.   NECK:  Supple w/ fair ROM; no JVD; normal carotid impulses w/o bruits; no  thyromegaly or nodules palpated; no lymphadenopathy.    RESP  Clear  P & A; w/o, wheezes/ rales/ or rhonchi. no accessory muscle use, no dullness to percussion  CARD:  RRR, no m/r/g, no peripheral edema, pulses intact, no cyanosis or clubbing.  GI:   Soft & nt; nml bowel sounds; no organomegaly or masses detected.   Musco: Warm bil, no deformities or joint  swelling noted.   Neuro: alert, no focal deficits noted.    Skin: Warm, no lesions or rashes    Lab Results:   ProBNP No results found for: PROBNP  Imaging: No results found.    No flowsheet data found.  No results found for: NITRICOXIDE      Assessment & Plan:   Obstructive sleep apnea Good control and compliance  Encouraged to try to wear all night each night , try to get >6hr of sleep   Plan  Patient Instructions  Continue on CPAP at bedtime Continue to work on healthy weight Do not drive if sleepy Continue on Requip Follow-up with Dr. Elsworth Soho in 1 year and as needed     RESTLESS LEGS SYNDROME RLS - continue on current regimen   Plan  Patient Instructions  Continue on CPAP at bedtime Continue to work on healthy weight Do not drive if sleepy Continue on Requip Follow-up with Dr. Elsworth Soho in 1 year and as needed        Rexene Edison, NP 10/19/2020

## 2020-10-23 ENCOUNTER — Ambulatory Visit (INDEPENDENT_AMBULATORY_CARE_PROVIDER_SITE_OTHER): Payer: Medicare PPO

## 2020-10-23 DIAGNOSIS — I4819 Other persistent atrial fibrillation: Secondary | ICD-10-CM | POA: Diagnosis not present

## 2020-10-26 LAB — CUP PACEART REMOTE DEVICE CHECK
Date Time Interrogation Session: 20220112030147
Implantable Pulse Generator Implant Date: 20210322

## 2020-11-06 NOTE — Progress Notes (Signed)
[facility] Summary Report / Loop Recorder 

## 2020-11-14 ENCOUNTER — Other Ambulatory Visit: Payer: Self-pay | Admitting: Internal Medicine

## 2020-11-22 LAB — CUP PACEART REMOTE DEVICE CHECK
Date Time Interrogation Session: 20220214033158
Implantable Pulse Generator Implant Date: 20210322

## 2020-11-27 ENCOUNTER — Ambulatory Visit (INDEPENDENT_AMBULATORY_CARE_PROVIDER_SITE_OTHER): Payer: Medicare PPO

## 2020-11-27 DIAGNOSIS — I4819 Other persistent atrial fibrillation: Secondary | ICD-10-CM

## 2020-11-30 NOTE — Progress Notes (Signed)
Cardiology Office Note   Date:  12/01/2020   ID:  Jenna, [name] [birth year], MRN [id number]  PCP:  Jenna Noon, MD  Cardiologist: Dr. Debara Pickett CC: Jenna [name]    History of Present Illness: Jenna [name] is a 80 y.o. female who presents for ongoing assessment and management of mild aortic stenosis with negative nuclear stress test in August 2013.  She has a history of exercise intolerance causing palpitations.  A monitor was placed to rule out atrial fib, which did not show episodes of arrhythmia.  Dr. Debara Pickett placed a longer event monitor on her for 1 month.  The results indicated 1 very brief episode of atrial fibrillation of less than 10 seconds in duration out of a total of 40,000 seconds of monitoring.  Given her low burden, she was started only on low-dose aspirin.  If she had more episodes discussion for anticoagulation would be recommended.  On last office visit with Dr. Debara Pickett dated 11/06/2019 the patient had had an ablation after being seen by EP secondary to an episode in March 2018 with A. fib with RVR and symptoms of heart failure.  Prior to the ablation the patient did have a transesophageal echocardiogram which demonstrated an EF of 20 to 25%'s with no evidence of LV thrombus.  This was followed by catheter ablation which was a successful procedure converting her to normal sinus rhythm.  Afterwards echocardiogram revealed an EF of 60 to 65%.  She had no recurrence of atrial fibrillation on that follow-up appointment with Dr. Debara Pickett.  Dr. Debara Pickett discussed with her the possibility of stopping anticoagulation therapy but she was reluctant to do that.  She was therefore continued on Eliquis.  She will also have follow-up echoes concerning moderate aortic stenosis which will be due in another year.  She comes today with complaints of Jenna [name].  She has labs from her PCP that she wanted to discuss further with me. She denies palpitations, chest pain or DOE. She is having urinary frequency and  requests a U/A   Past Medical History:  Diagnosis Date  . Anxiety   . CHF (congestive heart failure) ([place])   . Chronic lower back pain   . Chronic systolic dysfunction of left ventricle   . Depression   . Dysrhythmia    A-Fib  . Fatigue   . GERD (gastroesophageal reflux disease)   . High cholesterol   . Hypertension   . Hypothyroidism   . Melanoma of shoulder, left ([place])   . Mild aortic stenosis   . Nonischemic cardiomyopathy ([place])   . OSA on CPAP    reports compliance with CPAP  . Osteoarthritis   . Persistent atrial fibrillation with RVR ([place])    Jenna [name] 11/15/2016  . PONV (postoperative nausea and vomiting)   . RLS (restless legs syndrome)   . S/P ablation of atrial flutter/ fib 01/10/17 01/11/2017  . Type II diabetes mellitus (HCC)    no medications    Past Surgical History:  Procedure Laterality Date  . ATRIAL FIBRILLATION ABLATION N/A 01/10/2017   Procedure: Atrial Fibrillation Ablation;  Surgeon: Thompson Grayer, MD;  Location: [place] [facility];  Service: Cardiovascular;  Laterality: N/A;  . CARDIOVERSION N/A 11/19/2016   Procedure: CARDIOVERSION;  Surgeon: Lelon Perla, MD;  Location: [facility] ENDOSCOPY;  Service: Cardiovascular;  Laterality: N/A;  . CARDIOVERSION N/A 12/11/2016   Procedure: CARDIOVERSION;  Surgeon: Dorothy Spark, MD;  Location: [place];  Service: Cardiovascular;  Laterality: N/A;  . CARDIOVERSION N/A 12/18/2016  Procedure: CARDIOVERSION;  Surgeon: Skeet Latch, MD;  Location: [facility] ENDOSCOPY;  Service: Cardiovascular;  Laterality: N/A;  . CATARACT EXTRACTION W/ INTRAOCULAR LENS  IMPLANT, BILATERAL Bilateral   . DILATION AND CURETTAGE OF UTERUS    . implantable loop recorder placement  12/27/2019   Medtronic Reveal Linq model LNQ 41 (SN [id number]) implanted in office by Dr Jenna [name]  . LEXISCAN MYOVIEW  05/13/2012   No ECG changes. EKG negative for ischemia. No significant ischemia demonstrated.  Jenna [name] MELANOMA EXCISION Left ~ 09/1979   "back of my  shoulder"  . TEE WITHOUT CARDIOVERSION N/A 11/19/2016   Procedure: TRANSESOPHAGEAL ECHOCARDIOGRAM (TEE);  Surgeon: Lelon Perla, MD;  Location: [facility] ENDOSCOPY;  Service: Cardiovascular;  Laterality: N/A;  need CV, but no anesthesia available  . TEE WITHOUT CARDIOVERSION N/A 01/10/2017   Procedure: TRANSESOPHAGEAL ECHOCARDIOGRAM (TEE);  Surgeon: [name], MD;  Location: [facility] ENDOSCOPY;  Service: Cardiovascular;  Laterality: N/A;  . TOTAL KNEE ARTHROPLASTY Right 07/28/2018   Procedure: RIGHT TOTAL KNEE ARTHROPLASTY;  Surgeon: Mcarthur Rossetti, MD;  Location: [facility];  Service: Orthopedics;  Laterality: Right;  . TRANSTHORACIC ECHOCARDIOGRAM  02/18/2012   EF >55%, mild aortic stenosis  . VAGINAL HYSTERECTOMY  1985     Current Outpatient Medications  Medication Sig Dispense Refill  . acetaminophen (TYLENOL) 500 MG tablet Take 500-1,000 mg by mouth daily.    Jenna [name] ALPRAZolam (XANAX) 0.5 MG tablet Take 0.5 mg by mouth 2 (two) times daily.     Jenna [name] atorvastatin (LIPITOR) 20 MG tablet TAKE 1 TABLET (20 MG TOTAL) BY MOUTH 3 (THREE) TIMES A WEEK. 36 tablet 3  . buPROPion (WELLBUTRIN XL) 150 MG 24 hr tablet Take 300 mg by mouth daily.     . Calcium Carbonate-Vitamin D (CALCIUM + D PO) Take 1 tablet by mouth 2 (two) times daily.     . carvedilol (COREG) 12.5 MG tablet Take 1 tablet (12.5 mg total) by mouth 2 (two) times daily. 90 tablet 1  . ELIQUIS 5 MG TABS tablet TAKE 1 TABLET BY MOUTH TWICE A DAY 180 tablet 1  . ferrous gluconate (FERGON) 324 MG tablet     . furosemide (LASIX) 20 MG tablet Take 20 mg by mouth daily. TOTAL 20 MG.    . levothyroxine (SYNTHROID, LEVOTHROID) 88 MCG tablet Take 88 mcg by mouth daily before breakfast.    . Magnesium 500 MG TABS Take 500 mg by mouth daily.     . Multiple Vitamins-Minerals (PRESERVISION AREDS 2) CAPS Take 1 capsule by mouth 2 (two) times daily.    Jenna [name] omeprazole (PRILOSEC) 40 MG capsule Take 40 mg by mouth daily.    [name] ULTRA test strip     .  Polyethyl Glycol-Propyl Glycol 0.4-0.3 % SOLN Place 2 drops into both eyes 4 (four) times daily.    Jenna [name] pyridoxine (B-6) 100 MG tablet Take 100 mg by mouth daily with supper.     Jenna [name] rOPINIRole (REQUIP) 0.5 MG tablet Take 0.5 mg by mouth 4 (four) times daily.     Jenna [name] spironolactone (ALDACTONE) 50 MG tablet Take 1 tablet by mouth daily.      No current facility-administered medications for this visit.    Allergies:   Amoxicillin, Crestor [rosuvastatin], and Lovastatin    Social History:  The patient  reports that she has never smoked. She has never used smokeless tobacco. She reports that she does not drink alcohol and does not use drugs.   Family History:  The patient's family  history includes Arrhythmia in her sister; Cancer in her paternal grandfather; Diabetes in her mother; Healthy in her brother and daughter; Heart failure in her father; Hyperlipidemia in her sister and son; Hypertension in her mother, sister, and son; Stroke in her paternal grandmother.    ROS: All other systems are reviewed and negative. Unless otherwise mentioned in H&P    PHYSICAL EXAM: VS:  BP 124/66   Pulse (!) 104   Ht 5\' 5"  (1.651 m)   Wt 193 lb (87.5 kg)   SpO2 98%   BMI 32.12 kg/m  , BMI Body mass index is 32.12 kg/m. GEN: Well nourished, well developed, in no acute distress HEENT: normal Neck: no JVD, carotid bruits, or masses Cardiac: RRR; 1/6 systolic  murmurs, rubs, or gallops,no edema  Respiratory:  Clear to auscultation bilaterally, normal work of breathing GI: soft, nontender, nondistended, + BS MS: no deformity or atrophy Skin: warm and dry, no rash Neuro:  Strength and sensation are intact Psych: euthymic mood, full affect   EKG: Not completed this office visit.  Recent Labs: 04/16/2020: B Natriuretic Peptide 109.6; BUN 26; Creatinine, Ser 1.03; Hemoglobin 10.3; Platelets 486; Potassium 4.3; Sodium 137    Lipid Panel    Component Value Date/Time   CHOL 170 02/11/2017 0941   TRIG 93  02/11/2017 0941   HDL 82 02/11/2017 0941   CHOLHDL 2.1 02/11/2017 0941   VLDL 19 02/11/2017 0941   LDLCALC 69 02/11/2017 0941      Wt Readings from Last 3 Encounters:  12/01/20 193 lb (87.5 kg)  10/19/20 200 lb 3.2 oz (90.8 kg)  09/05/20 192 lb (87.1 kg)      Other studies Reviewed: Echocardiogram 2020/03/05  1. Left ventricular ejection fraction, by estimation, is 60 to 65%. The  left ventricle has normal function. The left ventricle has no regional  wall motion abnormalities. Left ventricular diastolic parameters are  consistent with Grade I diastolic  dysfunction (impaired relaxation).  2. Right ventricular systolic function is normal. The right ventricular  size is normal. There is normal pulmonary artery systolic pressure.  3. The mitral valve is normal in structure. No evidence of mitral valve  regurgitation. No evidence of mitral stenosis.  4. The aortic valve is tricuspid. Aortic valve regurgitation is mild.  Moderate aortic valve stenosis. Aortic valve mean gradient measures 24.0  mmHg. Aortic valve Vmax measures 3.07 m/s.  5. The inferior vena cava is normal in size with greater than 50%  respiratory variability, suggesting right atrial pressure of 3 mmHg.    ASSESSMENT AND PLAN:  1.  Lower  extremity edema: She does have some mild 2+ bilateral lower extremity edema, denies any dietary indiscretion concerning salt.  I will temporarily increase her lasix to 40 mg daily from 20 mg daily, for two days to help her lose fluid. Then return to lasix 20 mg daily thereafter. Check a BMET in 5 days, She is due for ab echocardiogram in May 2022.   2. Iron Deficiency Anemia: She is to follow up with hematology or treatment.   3. Atrial fib: S/P Ablation. She is on rate control but not on anticoagulation therapy. Continue ASA carvedilol,   4. Dysuria: U/A will be ordered.    Current medicines are reviewed at length with the patient today.  I have spent 30 mins dedicated to  the care of this patient on the date of this encounter to include pre-visit review of records, assessment, management and diagnostic testing,with shared decision making.  Labs/ tests ordered today include: U/A   Phill Myron. [place] Pugh, ANP, AACC   12/01/2020 2:40 PM    [facility] [place] [address] Office [phone] Fax [phone]  Notice: This dictation was prepared with Dragon dictation along with smaller phrase technology. Any transcriptional errors that result from this process are unintentional and may not be corrected upon review.

## 2020-12-01 ENCOUNTER — Other Ambulatory Visit: Payer: Self-pay

## 2020-12-01 ENCOUNTER — Ambulatory Visit: Payer: Medicare PPO | Admitting: Adult Health

## 2020-12-01 ENCOUNTER — Encounter: Payer: Self-pay | Admitting: Adult Health

## 2020-12-01 VITALS — BP 124/66 | HR 104 | Ht 65.0 in | Wt 193.0 lb

## 2020-12-01 DIAGNOSIS — I4819 Other persistent atrial fibrillation: Secondary | ICD-10-CM | POA: Diagnosis not present

## 2020-12-01 DIAGNOSIS — Z9889 Other specified postprocedural states: Secondary | ICD-10-CM | POA: Diagnosis not present

## 2020-12-01 DIAGNOSIS — Z8679 Personal history of other diseases of the circulatory system: Secondary | ICD-10-CM

## 2020-12-01 DIAGNOSIS — Z79899 Other long term (current) drug therapy: Secondary | ICD-10-CM | POA: Diagnosis not present

## 2020-12-01 DIAGNOSIS — R3989 Other symptoms and signs involving the genitourinary system: Secondary | ICD-10-CM

## 2020-12-01 DIAGNOSIS — I5042 Chronic combined systolic (congestive) and diastolic (congestive) heart failure: Secondary | ICD-10-CM

## 2020-12-01 NOTE — Patient Instructions (Signed)
Medication Instructions:  INCREASE- Furosemide(Lasix) 40 mg(2 tablets) by mouth for 2 days then back to 20 mg by mouth daily  *If you need a refill on your cardiac medications before your next appointment, please call your pharmacy*   Lab Work: [facility] Tuesday  If you have labs (blood work) drawn today and your tests are completely normal, you will receive your results only by: [name] MyChart Message (if you have MyChart) OR . A paper copy in the mail If you have any lab test that is abnormal or we need to change your treatment, we will call you to review the results.   Testing/Procedures: None Ordered   Follow-Up: At [facility], you and your health needs are our priority.  As part of our continuing mission to provide you with exceptional heart care, we have created designated Provider Care Teams.  These Care Teams include your primary Cardiologist (physician) and Advanced Practice Providers (APPs -  Physician Assistants and Nurse Practitioners) who all work together to provide you with the care you need, when you need it.  We recommend signing up for the patient portal called "MyChart".  Sign up information is provided on this After Visit Summary.  MyChart is used to connect with patients for Virtual Visits (Telemedicine).  Patients are able to view lab/test results, encounter notes, upcoming appointments, etc.  Non-urgent messages can be sent to your provider as well.   To learn more about what you can do with MyChart, go to [email or web address].    Your next appointment:   Friday March 11th @ 11:15 am  The format for your next appointment:   In Person  Provider:   Jory Sims, DNP, ANP

## 2020-12-02 LAB — URINALYSIS
Bilirubin, UA: NEGATIVE
Glucose, UA: NEGATIVE
Ketones, UA: NEGATIVE
Nitrite, UA: NEGATIVE
Protein,UA: NEGATIVE
RBC, UA: NEGATIVE
Specific Gravity, UA: 1.009 (ref 1.005–1.030)
Urobilinogen, Ur: 0.2 mg/dL (ref 0.2–1.0)
pH, UA: 6.5 (ref 5.0–7.5)

## 2020-12-04 NOTE — Progress Notes (Signed)
[facility] Summary Report / Loop Recorder 

## 2020-12-08 ENCOUNTER — Telehealth: Payer: Self-pay | Admitting: Adult Health

## 2020-12-08 NOTE — Telephone Encounter (Signed)
° °  Pt is returning call to get lab result °

## 2020-12-08 NOTE — Telephone Encounter (Signed)
Pt updated with urinalysis report but informed she will have to return next week for repeat BMP due to mishap with [facility]. Pt verbalized understanding and state she will return Monday.

## 2020-12-12 LAB — BASIC METABOLIC PANEL
BUN/Creatinine Ratio: 25 (ref 12–28)
BUN: 27 mg/dL (ref 8–27)
CO2: 23 mmol/L (ref 20–29)
Calcium: 9.8 mg/dL (ref 8.7–10.3)
Chloride: 99 mmol/L (ref 96–106)
Creatinine, Ser: 1.08 mg/dL — ABNORMAL HIGH (ref 0.57–1.00)
Glucose: 95 mg/dL (ref 65–99)
Potassium: 4.4 mmol/L (ref 3.5–5.2)
Sodium: 139 mmol/L (ref 134–144)
eGFR: 53 mL/min/{1.73_m2} — ABNORMAL LOW (ref >59)

## 2020-12-13 ENCOUNTER — Encounter: Payer: Self-pay | Admitting: *Deleted

## 2020-12-14 NOTE — Progress Notes (Signed)
Cardiology Office Note   Date:  12/15/2020   ID:  Jenna [name] 05-07-1941, MRN [id number]  PCP:  Jenna Noon, Jenna [name]  Cardiologist: Jenna [name] CC: Follow-up, lower extremity edema   History of Present Illness: Jenna [name] is a 80 y.o. female who presents for ongoing assessment and management of mild aortic stenosis with negative nuclear stress test in August 2013.  She also has a history of exercise intolerance as this causes palpitations.  A monitor was placed to rule out atrial fibrillation which was negative.  However a longer 1 month event monitor was placed and the results indicated 1 very brief episode of atrial fibrillation of less than 10 seconds in duration.  Given her low burden she was started only on low-dose aspirin.  On last office visit with Jenna [name] dated 11/06/2019 the patient had had an ablation after being seen by EP secondary to an episode in March 2018 with A. fib with RVR and symptoms of heart failure.  Prior to the ablation the patient did have a transesophageal echocardiogram which demonstrated an EF of 20 to 25%'s with no evidence of LV thrombus.  This was followed by catheter ablation which was a successful procedure converting her to normal sinus rhythm.  Afterwards echocardiogram revealed an EF of 60 to 65%.  She had no recurrence of atrial fibrillation on that follow-up appointment with Jenna [name].  Jenna [name] discussed with her the possibility of stopping anticoagulation therapy but she was reluctant to do that.  She was therefore continued on Eliquis.  She will also have follow-up echoes concerning moderate aortic stenosis which will be due in another year.  On last office visit with me dated 12/01/2020 the patient complained of lower extremity edema and urinary frequency and was worried about a UTI.  Follow-up labs were completed and she was negative for UTI.  I did temporarily increase her Lasix to 40 mg daily from 20 mg daily for 2 days to help her with  lower extremity edema and then she was to return to Lasix 20 mg daily thereafter.  She was also counseled on dietary indiscretion concerning salt.  Follow-up labs were also completed for kidney function.  There was a slight increase in serum creatinine of 1.08 from 1.038 months earlier, GFR had a slight decrease to 53.   She comes today feeling better after going up on Lasix to 40 mg daily for 2 days.  She has gone back down to 20 mg daily now.  She continues to have some mild dependent edema otherwise she has been doing well.  She continues to have some urinary issues with fullness in her bladder along with pelvic pain after micturition.  Past Medical History:  Diagnosis Date  . Anxiety   . CHF (congestive heart failure) ([place])   . Chronic lower back pain   . Chronic systolic dysfunction of left ventricle   . Depression   . Dysrhythmia    A-Fib  . Fatigue   . GERD (gastroesophageal reflux disease)   . High cholesterol   . Hypertension   . Hypothyroidism   . Melanoma of shoulder, left ([place])   . Mild aortic stenosis   . Nonischemic cardiomyopathy ([place])   . OSA on CPAP    reports compliance with CPAP  . Osteoarthritis   . Persistent atrial fibrillation with RVR ([place])    Jenna [name] 11/15/2016  . PONV (postoperative nausea and vomiting)   . RLS (restless legs syndrome)   .  S/P ablation of atrial flutter/ fib 01/10/17 01/11/2017  . Type II diabetes mellitus ([facility])    no medications    Past Surgical History:  Procedure Laterality Date  . ATRIAL FIBRILLATION ABLATION N/A 01/10/2017   Procedure: Atrial Fibrillation Ablation;  Surgeon: Jenna Grayer, Jenna [name];  Location: [facility];  Service: Cardiovascular;  Laterality: N/A;  . CARDIOVERSION N/A 11/19/2016   Procedure: CARDIOVERSION;  Surgeon: Jenna Perla, Jenna [name];  Location: [facility] ENDOSCOPY;  Service: Cardiovascular;  Laterality: N/A;  . CARDIOVERSION N/A 12/11/2016   Procedure: CARDIOVERSION;  Surgeon: Jenna Spark, Jenna [name];  Location: [facility] ENDOSCOPY;   Service: Cardiovascular;  Laterality: N/A;  . CARDIOVERSION N/A 12/18/2016   Procedure: CARDIOVERSION;  Surgeon: Jenna Latch, Jenna [name];  Location: [facility] ENDOSCOPY;  Service: Cardiovascular;  Laterality: N/A;  . CATARACT EXTRACTION W/ INTRAOCULAR LENS  IMPLANT, BILATERAL Bilateral   . DILATION AND CURETTAGE OF UTERUS    . implantable loop recorder placement  12/27/2019   Medtronic Reveal Linq model LNQ 5 (SN [id number]) implanted in office by Dr Jenna [name]  . LEXISCAN MYOVIEW  05/13/2012   No ECG changes. EKG negative for ischemia. No significant ischemia demonstrated.  [name] MELANOMA EXCISION Left ~ 09/1979   "back of my shoulder"  . TEE WITHOUT CARDIOVERSION N/A 11/19/2016   Procedure: TRANSESOPHAGEAL ECHOCARDIOGRAM (TEE);  Surgeon: Jenna Perla, Jenna [name];  Location: [facility] ENDOSCOPY;  Service: Cardiovascular;  Laterality: N/A;  need CV, but no anesthesia available  . TEE WITHOUT CARDIOVERSION N/A 01/10/2017   Procedure: TRANSESOPHAGEAL ECHOCARDIOGRAM (TEE);  Surgeon: Jenna Casino, Jenna [name];  Location: [facility] ENDOSCOPY;  Service: Cardiovascular;  Laterality: N/A;  . TOTAL KNEE ARTHROPLASTY Right 07/28/2018   Procedure: RIGHT TOTAL KNEE ARTHROPLASTY;  Surgeon: Jenna Rossetti, Jenna [name];  Location: [facility];  Service: Orthopedics;  Laterality: Right;  . TRANSTHORACIC ECHOCARDIOGRAM  02/18/2012   EF >55%, mild aortic stenosis  . VAGINAL HYSTERECTOMY  1985     Current Outpatient Medications  Medication Sig Dispense Refill  . acetaminophen (TYLENOL) 500 MG tablet Take 500-1,000 mg by mouth daily.    [name] ALPRAZolam (XANAX) 0.5 MG tablet Take 0.5 mg by mouth 2 (two) times daily.     [name] atorvastatin (LIPITOR) 20 MG tablet TAKE 1 TABLET (20 MG TOTAL) BY MOUTH 3 (THREE) TIMES A WEEK. 36 tablet 3  . buPROPion (WELLBUTRIN XL) 150 MG 24 hr tablet Take 300 mg by mouth daily.     . Calcium Carbonate-Vitamin D (CALCIUM + D PO) Take 1 tablet by mouth 2 (two) times daily.     . carvedilol (COREG) 12.5 MG tablet Take 1 tablet (12.5 mg  total) by mouth 2 (two) times daily. 90 tablet 1  . ELIQUIS 5 MG TABS tablet TAKE 1 TABLET BY MOUTH TWICE A DAY 180 tablet 1  . ferrous gluconate (FERGON) 324 MG tablet     . furosemide (LASIX) 20 MG tablet Take 20 mg by mouth daily. TOTAL 20 MG.    . levothyroxine (SYNTHROID, LEVOTHROID) 88 MCG tablet Take 88 mcg by mouth daily before breakfast.    . Magnesium 500 MG TABS Take 500 mg by mouth daily.     . Multiple Vitamins-Minerals (PRESERVISION AREDS 2) CAPS Take 1 capsule by mouth 2 (two) times daily.    [name] omeprazole (PRILOSEC) 40 MG capsule Take 40 mg by mouth daily.    [name] ULTRA test strip     . Polyethyl Glycol-Propyl Glycol 0.4-0.3 % SOLN Place 2 drops into both eyes 4 (four) times daily.    [name]  pyridoxine (B-6) 100 MG tablet Take 100 mg by mouth daily with supper.     [name] rOPINIRole (REQUIP) 0.5 MG tablet Take 0.5 mg by mouth 4 (four) times daily.     [name] spironolactone (ALDACTONE) 50 MG tablet Take 1 tablet by mouth daily.      No current facility-administered medications for this visit.    Allergies:   Amoxicillin, Crestor [rosuvastatin], and Lovastatin    Social History:  The patient  reports that she has never smoked. She has never used smokeless tobacco. She reports that she does not drink alcohol and does not use drugs.   Family History:  The patient's family history includes Arrhythmia in her sister; Cancer in her paternal grandfather; Diabetes in her mother; Healthy in her brother and daughter; Heart failure in her father; Hyperlipidemia in her sister and son; Hypertension in her mother, sister, and son; Stroke in her paternal grandmother.    ROS: All other systems are reviewed and negative. Unless otherwise mentioned in H&P    PHYSICAL EXAM: VS:  BP 117/62   Pulse 100   Ht 5' 5.5" (1.664 m)   Wt 190 lb (86.2 kg)   SpO2 96%   BMI 31.14 kg/m  , BMI Body mass index is 31.14 kg/m. GEN: Well nourished, well developed, in no acute distress HEENT: normal Neck: no  JVD, carotid bruits, or masses Cardiac: .RRR; 1/6 holosystolic murmur heard best at the right sternal border, no ubs, or gallops,no edema  Respiratory:  Clear to auscultation bilaterally, normal work of breathing GI: soft, nontender, nondistended, + BS MS: no deformity or atrophy Skin: warm and dry, no rash Neuro:  Strength and sensation are intact Psych: euthymic mood, full affect   EKG: Not completed this office visit  Recent Labs: 04/16/2020: B Natriuretic Peptide 109.6; Hemoglobin 10.3; Platelets 486 12/11/2020: BUN 27; Creatinine, Ser 1.08; Potassium 4.4; Sodium 139    Lipid Panel    Component Value Date/Time   CHOL 170 02/11/2017 0941   TRIG 93 02/11/2017 0941   HDL 82 02/11/2017 0941   CHOLHDL 2.1 02/11/2017 0941   VLDL 19 02/11/2017 0941   LDLCALC 69 02/11/2017 0941      Wt Readings from Last 3 Encounters:  12/15/20 190 lb (86.2 kg)  12/01/20 193 lb (87.5 kg)  10/19/20 200 lb 3.2 oz (90.8 kg)      Other studies Reviewed: Echocardiogram March 09, 2020  1. Left ventricular ejection fraction, by estimation, is 60 to 65%. The  left ventricle has normal function. The left ventricle has no regional  wall motion abnormalities. Left ventricular diastolic parameters are  consistent with Grade I diastolic  dysfunction (impaired relaxation).  2. Right ventricular systolic function is normal. The right ventricular  size is normal. There is normal pulmonary artery systolic pressure.  3. The mitral valve is normal in structure. No evidence of mitral valve  regurgitation. No evidence of mitral stenosis.  4. The aortic valve is tricuspid. Aortic valve regurgitation is mild.  Moderate aortic valve stenosis. Aortic valve mean gradient measures 24.0  mmHg. Aortic valve Vmax measures 3.07 m/s.  5. The inferior vena cava is normal in size with greater than 50%  respiratory variability, suggesting right atrial pressure of 3 mmHg.    ASSESSMENT AND PLAN:  1.  Chronic diastolic  CHF: Has dependent lower extremity edema which has subsided with increase of Lasix temporarily to 40 mg daily.  She is now on 20 mg daily.  She is advised to continue this regimen,  weight yourself daily, and avoid salty foods.  No changes in her medication regimen at this time.  She will continue spironolactone 50 mg daily, carvedilol 12.5 mg twice daily.  2.  Hyperlipidemia: Continue atorvastatin 20 mg daily.  Lipids and LFTs are followed by primary care.  Goal of LDL less than 100.  3.  Chronic kidney disease: Most recent labs revealed a bump in her creatinine to 1.08 from 1.03.  She continues to have some pelvic pain and dysuria despite negative UA.  She is to discuss referral to urology with her primary care physician.  4.  PAF: She continues on Eliquis 5 mg twice daily.-CHADS Vasc Score 5.    Current medicines are reviewed at length with the patient today.  I have spent 25 min's  dedicated to the care of this patient on the date of this encounter to include pre-visit review of records, assessment, management and diagnostic testing,with shared decision making.  Labs/ tests ordered today include: None Phill Myron. [place] Pugh, ANP, AACC   12/15/2020 12:56 PM    [facility] [place] [address] Office [phone] Fax [phone]  Notice: This dictation was prepared with Dragon dictation along with smaller phrase technology. Any transcriptional errors that result from this process are unintentional and may not be corrected upon review.

## 2020-12-15 ENCOUNTER — Other Ambulatory Visit: Payer: Self-pay

## 2020-12-15 ENCOUNTER — Ambulatory Visit: Payer: Medicare PPO | Admitting: Adult Health

## 2020-12-15 ENCOUNTER — Encounter: Payer: Self-pay | Admitting: Adult Health

## 2020-12-15 VITALS — BP 117/62 | HR 100 | Ht 65.5 in | Wt 190.0 lb

## 2020-12-15 DIAGNOSIS — N1831 Chronic kidney disease, stage 3a: Secondary | ICD-10-CM | POA: Diagnosis not present

## 2020-12-15 DIAGNOSIS — I480 Paroxysmal atrial fibrillation: Secondary | ICD-10-CM

## 2020-12-15 DIAGNOSIS — I10 Essential (primary) hypertension: Secondary | ICD-10-CM

## 2020-12-15 DIAGNOSIS — I5032 Chronic diastolic (congestive) heart failure: Secondary | ICD-10-CM | POA: Diagnosis not present

## 2020-12-15 LAB — BASIC METABOLIC PANEL
BUN/Creatinine Ratio: 25 (ref 12–28)
BUN: 29 mg/dL — ABNORMAL HIGH (ref 8–27)
CO2: 26 mmol/L (ref 20–29)
Calcium: 10.1 mg/dL (ref 8.7–10.3)
Chloride: 96 mmol/L (ref 96–106)
Creatinine, Ser: 1.15 mg/dL — ABNORMAL HIGH (ref 0.57–1.00)
Glucose: 103 mg/dL — ABNORMAL HIGH (ref 65–99)
Potassium: 5.4 mmol/L — ABNORMAL HIGH (ref 3.5–5.2)
Sodium: 139 mmol/L (ref 134–144)
eGFR: 49 mL/min/{1.73_m2} — ABNORMAL LOW (ref >59)

## 2020-12-15 NOTE — Patient Instructions (Addendum)
Medication Instructions:  Your physician recommends that you continue on your current medications as directed. Please refer to the Current Medication list given to you today.  *If you need a refill on your cardiac medications before your next appointment, please call your pharmacy*  Lab Work: NONE ordered at this time of appointment   If you have labs (blood work) drawn today and your tests are completely normal, you will receive your results only by: [name] MyChart Message (if you have MyChart) OR . A paper copy in the mail If you have any lab test that is abnormal or we need to change your treatment, we will call you to review the results.  Testing/Procedures: NONE ordered at this time of appointment   Follow-Up: At [facility], you and your health needs are our priority.  As part of our continuing mission to provide you with exceptional heart care, we have created designated Provider Care Teams.  These Care Teams include your primary Cardiologist (physician) and Advanced Practice Providers (APPs -  Physician Assistants and Nurse Practitioners) who all work together to provide you with the care you need, when you need it.   Your next appointment:   Follow up as scheduled   The format for your next appointment:   In Person  Provider:   K. Mali Hilty, MD   Other Instructions

## 2020-12-19 ENCOUNTER — Encounter: Payer: Self-pay | Admitting: *Deleted

## 2020-12-22 ENCOUNTER — Other Ambulatory Visit: Payer: Self-pay

## 2020-12-22 DIAGNOSIS — Z79899 Other long term (current) drug therapy: Secondary | ICD-10-CM

## 2020-12-22 MED ORDER — SPIRONOLACTONE 25 MG PO TABS: 25.0000 mg | ORAL_TABLET | Freq: Every day | ORAL | 3 refills | Status: DC

## 2020-12-22 NOTE — Progress Notes (Signed)
Letter with result mailed to pt

## 2020-12-22 NOTE — Addendum Note (Signed)
Addended by: Vennie Homans on: 12/22/2020 04:46 PM   Modules accepted: Orders

## 2020-12-29 LAB — BASIC METABOLIC PANEL
BUN/Creatinine Ratio: 22 (ref 12–28)
BUN: 26 mg/dL (ref 8–27)
CO2: 26 mmol/L (ref 20–29)
Calcium: 9.6 mg/dL (ref 8.7–10.3)
Chloride: 95 mmol/L — ABNORMAL LOW (ref 96–106)
Creatinine, Ser: 1.18 mg/dL — ABNORMAL HIGH (ref 0.57–1.00)
Glucose: 125 mg/dL — ABNORMAL HIGH (ref 65–99)
Potassium: 4.5 mmol/L (ref 3.5–5.2)
Sodium: 137 mmol/L (ref 134–144)
eGFR: 47 mL/min/{1.73_m2} — ABNORMAL LOW (ref >59)

## 2020-12-30 LAB — CUP PACEART REMOTE DEVICE CHECK
Date Time Interrogation Session: 20220319043021
Implantable Pulse Generator Implant Date: 20210322

## 2021-01-01 ENCOUNTER — Ambulatory Visit (INDEPENDENT_AMBULATORY_CARE_PROVIDER_SITE_OTHER): Payer: Medicare PPO

## 2021-01-01 DIAGNOSIS — I4821 Permanent atrial fibrillation: Secondary | ICD-10-CM | POA: Diagnosis not present

## 2021-01-05 ENCOUNTER — Telehealth: Payer: Self-pay | Admitting: Internal Medicine

## 2021-01-05 MED ORDER — SPIRONOLACTONE 25 MG PO TABS: 12.5000 mg | ORAL_TABLET | Freq: Every day | ORAL | 3 refills | Status: DC

## 2021-01-05 NOTE — Telephone Encounter (Signed)
Lendon Colonel, NP  01/03/2021 6:13 PM EDT      Decrease spironolactone to 12.5 mg daily from 25 mg daily as her creatinine is rising amd GFR is declining. See PCP for follow up concerning need to be seen by renal.      Patient Communication  Edit Comments Add Notifications Back to Top    Decrease spironolactone to 12.5 mg daily from 25 mg daily as your kidney function is strained on this medication. See PCP for follow up labs. Avoid salt and increase water intake.   Pt notified of lab results and recommendations per Jory Sims NP.  Advised the pt to decrease her spironolactone to 12.5 mg po daily, watch her salt intake and limit this, increase po water intake, and see her PCP for follow-up, concerning the need to be seen by renal.  Advised the pt to follow-up with her PCP sometime next week for this.  Informed her that I will route her lab results via [facility] fax function, to her PCP Dr. Melford Aase.  Pt states she would also like a copy of her lab results sent to her mailing address on file. Copy will be mailed to the pt.  Pt confirmed that she will call and follow-up with her PCP as directed next week. Pt verbalized understanding and agrees with this plan. Will send this message to [facility] covering [place] as an FYI about pt being contacted.

## 2021-01-05 NOTE — Telephone Encounter (Signed)
Result mailed to pt. 

## 2021-01-05 NOTE — Telephone Encounter (Signed)
Patient returning call regarding lab results  ?

## 2021-01-12 NOTE — Progress Notes (Signed)
[facility] Summary Report / Loop Recorder 

## 2021-01-23 ENCOUNTER — Other Ambulatory Visit: Payer: Self-pay | Admitting: Internal Medicine

## 2021-02-05 ENCOUNTER — Ambulatory Visit (INDEPENDENT_AMBULATORY_CARE_PROVIDER_SITE_OTHER): Payer: Medicare PPO

## 2021-02-05 DIAGNOSIS — I4821 Permanent atrial fibrillation: Secondary | ICD-10-CM | POA: Diagnosis not present

## 2021-02-06 LAB — CUP PACEART REMOTE DEVICE CHECK
Date Time Interrogation Session: 20220430231729
Implantable Pulse Generator Implant Date: 20210322

## 2021-02-12 ENCOUNTER — Other Ambulatory Visit: Payer: Self-pay

## 2021-02-12 ENCOUNTER — Ambulatory Visit (HOSPITAL_COMMUNITY): Payer: Medicare PPO | Attending: Cardiology

## 2021-02-12 DIAGNOSIS — I350 Nonrheumatic aortic (valve) stenosis: Secondary | ICD-10-CM | POA: Diagnosis present

## 2021-02-12 LAB — ECHOCARDIOGRAM COMPLETE
AR max vel: 0.69 cm2
AV Area VTI: 0.64 cm2
AV Area mean vel: 0.65 cm2
AV Mean grad: 34 mmHg
AV Peak grad: 52.8 mmHg
Ao pk vel: 3.63 m/s
Area-P 1/2: 2.84 cm2
S' Lateral: 2.5 cm

## 2021-02-15 ENCOUNTER — Ambulatory Visit: Payer: Medicare PPO | Admitting: Internal Medicine

## 2021-02-15 ENCOUNTER — Encounter: Payer: Self-pay | Admitting: Internal Medicine

## 2021-02-15 ENCOUNTER — Other Ambulatory Visit: Payer: Self-pay

## 2021-02-15 VITALS — BP 122/61 | HR 72 | Ht 65.0 in | Wt 197.6 lb

## 2021-02-15 DIAGNOSIS — Z8679 Personal history of other diseases of the circulatory system: Secondary | ICD-10-CM

## 2021-02-15 DIAGNOSIS — I350 Nonrheumatic aortic (valve) stenosis: Secondary | ICD-10-CM

## 2021-02-15 DIAGNOSIS — Z79899 Other long term (current) drug therapy: Secondary | ICD-10-CM

## 2021-02-15 DIAGNOSIS — I10 Essential (primary) hypertension: Secondary | ICD-10-CM

## 2021-02-15 DIAGNOSIS — Z9889 Other specified postprocedural states: Secondary | ICD-10-CM | POA: Diagnosis not present

## 2021-02-15 MED ORDER — FUROSEMIDE 40 MG PO TABS: 40.0000 mg | ORAL_TABLET | Freq: Every day | ORAL | 3 refills | Status: DC

## 2021-02-15 NOTE — Patient Instructions (Signed)
Medication Instructions:  INCREASE- Furosemide(Lasix) 40 mg by mouth daily  *If you need a refill on your cardiac medications before your next appointment, please call your pharmacy*   Lab Work: BNP and BMP in 2 weeks  If you have labs (blood work) drawn today and your tests are completely normal, you will receive your results only by: [name] MyChart Message (if you have MyChart) OR . A paper copy in the mail If you have any lab test that is abnormal or we need to change your treatment, we will call you to review the results.   Testing/Procedures: Your physician has requested that you have an echocardiogram in 6 Months. Echocardiography is a painless test that uses sound waves to create images of your heart. It provides your doctor with information about the size and shape of your heart and how well your heart's chambers and valves are working. This procedure takes approximately one hour. There are no restrictions for this procedure.   Follow-Up: At [facility], you and your health needs are our priority.  As part of our continuing mission to provide you with exceptional heart care, we have created designated Provider Care Teams.  These Care Teams include your primary Cardiologist (physician) and Advanced Practice Providers (APPs -  Physician Assistants and Nurse Practitioners) who all work together to provide you with the care you need, when you need it.  We recommend signing up for the patient portal called "MyChart".  Sign up information is provided on this After Visit Summary.  MyChart is used to connect with patients for Virtual Visits (Telemedicine).  Patients are able to view lab/test results, encounter notes, upcoming appointments, etc.  Non-urgent messages can be sent to your provider as well.   To learn more about what you can do with MyChart, go to [email or web address].    Your next appointment:   6 month(s)  The format for your next appointment:   In Person  Provider:    You may see [name], MD or one of the following Advanced Practice Providers on your designated Care Team:    Almyra Deforest, PA-C  Fabian Sharp, PA-C or   Roby Lofts, [place]

## 2021-02-15 NOTE — Progress Notes (Signed)
OFFICE NOTE  Chief Complaint:   Follow-up  Primary Care Physician: Chesley Noon, MD  HPI:  Jenna [name] is a 80 year old female with a history of mild aortic stenosis and a negative nuclear stress test in August 2013. Recently she had an episode of palpitations which she felt lasted up to about 10 minutes. During that time she felt a decrease in exercise tolerance with fatigue and no energy. This was concerning for AFib; however, she wore a monitor for a short period of time and it did not show that.  I set her up for a 30-day monitor, which she worse very faithfully between February 26 and December 31, 2012. The results indicated 1 very brief episode of AFib of less than 10 seconds in duration out of a total of over 40,000 seconds of monitoring. This correlates with a burden of less than 0.003%. At this point, given her low burden I feel that it is still reasonable to treat her with low-dose aspirin. However, we will continue to see her closely and adjust her anticoagulation as necessary. Since her last follow-up she denies any significant palpitations. She has reported some LE swelling, which she thinks is better now that she is off of her ARB?  Jenna [name] returns today for followup. She reports doing fairly well. In August she said she had an episode of age of fibrillation when coming home from the beach. That day she walked up and down the stairs several times and felt her heart racing. It never seemed to slow down. Finally she felt better the next day however she was fatigued and eventually recovered. Since that time she's had no further events. She does get some mild swelling in her legs. She is reporting some fatigue since starting a beta blocker and feels that it may be a side effect.  I saw Jenna [name] back today in the office. She seems to doing fairly well from a cardiac standpoint. She denies any significant palpitations and thinks that the diltiazem is generally controlled them. If  she is having A. fib is very infrequent and a low burden. She is maintained only on aspirin. Unfortunate she's been struggling with depression which occurred after grieving for the death of her husband. She's been apparently on several different antidepressive medicines with mixed results and recently on Wellbutrin which she thinks is somewhat helpful. She did have somewhat of a flat affect in the office today.  07/08/2016  Jenna [name] returns today for follow-up. She is doing generally well and denies any palpitations of significance. EKG today shows sinus rhythm at 72. Her blood pressure is mildly elevated 141/79. Weight is up a few pounds. She continues to struggle with depression related to the death of her husband which is now a couple of years ago.  05/13/2017  Jenna [name] returns today for follow-up. Recently she was hospitalized in March with A. fib with RVR and symptoms of heart failure. Her diuretics were increased and she underwent cardioversion. This was initially successful, however ultimately she went back into A. fib. She was seen by EP and was placed on amiodarone and Ranexa. She was later evaluated and deemed a candidate for ablation. I performed a cardiac transesophageal echocardiogram which demonstrated an LVEF of 20-25% and no evidence of LV thrombus. Subsequently she underwent catheter ablation with successful conversion to sinus rhythm. Since that time she is maintaining sinus rhythm and a repeat echocardiogram on 04/22/2017 demonstrates normalization of LV function to an LVEF of 60-65%. She  reports that she's been feeling well. She's been worked with her primary care provider who recently obtained labs and indicated she may be a little dehydrated. She was on high-dose Lasix 80 mg daily daily which was reduced to 40 mg alternating with 80 mg every other day. She denies any worsening shortness of breath or chest pain. Blood pressure is at goal today. Her weight is been stable. She is compliant with  Eliquis for anticoagulation and denies any bleeding problems.  06/22/2018  Jenna [name] returns today for follow-up.  She reports some recent increase in swelling.  She denies any recurrent A. fib.  She was seen in the A. fib clinic in July and had not noted to have any A. fib at the time.  Fortunately her LVEF had normalized after ablation.  She denies any chest pain.  She continues to struggle with hip pain.  She says she has arthritis which is "bone-on-bone".  01/10/2020  Jenna [name] returns today for follow-up.  She has been followed in the A. fib clinic and has had good control of her A. fib after ablation however more recently had worsening palpitations.  She saw Dr. Rayann Heman and just had an implanted loop recorder placed a week ago.  Is a remote pacer check later this month.  In November she had a repeat echocardiogram which showed moderate to severe aortic stenosis with normal LV function.  She does report dyspnea on exertion, particularly walking upstairs or going long distances.  This seems to have worsened somewhat.  Another echo has been ordered for early May 2021.  EKG today shows sinus rhythm at 69.  09/05/2020  Jenna [name] is seen today in follow-up.  She has been seen by number of my physician assistants over the past several months as well as Dr. Rayann Heman back in September.  After ablation she has had no significant recurrent atrial fibrillation.  He even discussed with her about possibly stopping her anticoagulation but she is reluctant to do that.  She does seem somewhat anxious about her cardiac health.  We did an echo in May of this year which showed normal systolic function and moderate aortic stenosis with mild aortic insufficiency.  This is been fairly stable.  Blood pressure is good today.  EKG shows a sinus rhythm with sinus arrhythmia at 95.  02/15/2021  Jenna [name] returns today for follow-up.  Overall she is feeling well.  She denies any worsening chest pain or shortness of breath.  She did have a repeat  echo however which showed progression of her aortic stenosis.  This seems to have increased from moderate to now moderate to severe.  Her mean gradient is increased from 24 to 34 mmHg.  LVEF is normal.  She seems to have had no recurrent atrial fibrillation.  Her loop recorder demonstrates no arrhythmias however Dr. Rayann Heman recommended lifelong anticoagulation.  Blood pressures well controlled today.  EKG shows a normal sinus rhythm.  PMHx:  Past Medical History:  Diagnosis Date  . Anxiety   . CHF (congestive heart failure) ([place])   . Chronic lower back pain   . Chronic systolic dysfunction of left ventricle   . Depression   . Dysrhythmia    A-Fib  . Fatigue   . GERD (gastroesophageal reflux disease)   . High cholesterol   . Hypertension   . Hypothyroidism   . Melanoma of shoulder, left ([place])   . Mild aortic stenosis   . Nonischemic cardiomyopathy ([place])   . OSA on CPAP  reports compliance with CPAP  . Osteoarthritis   . Persistent atrial fibrillation with RVR ([place])    [name] 11/15/2016  . PONV (postoperative nausea and vomiting)   . RLS (restless legs syndrome)   . S/P ablation of atrial flutter/ fib 01/10/17 01/11/2017  . Type II diabetes mellitus ([facility])    no medications    Past Surgical History:  Procedure Laterality Date  . ATRIAL FIBRILLATION ABLATION N/A 01/10/2017   Procedure: Atrial Fibrillation Ablation;  Surgeon: Thompson Grayer, MD;  Location: [facility];  Service: Cardiovascular;  Laterality: N/A;  . CARDIOVERSION N/A 11/19/2016   Procedure: CARDIOVERSION;  Surgeon: Lelon Perla, MD;  Location: [facility] ENDOSCOPY;  Service: Cardiovascular;  Laterality: N/A;  . CARDIOVERSION N/A 12/11/2016   Procedure: CARDIOVERSION;  Surgeon: Dorothy Spark, MD;  Location: [facility] ENDOSCOPY;  Service: Cardiovascular;  Laterality: N/A;  . CARDIOVERSION N/A 12/18/2016   Procedure: CARDIOVERSION;  Surgeon: Skeet Latch, MD;  Location: [facility] ENDOSCOPY;  Service: Cardiovascular;  Laterality: N/A;   . CATARACT EXTRACTION W/ INTRAOCULAR LENS  IMPLANT, BILATERAL Bilateral   . DILATION AND CURETTAGE OF UTERUS    . implantable loop recorder placement  12/27/2019   Medtronic Reveal Linq model LNQ 60 (SN [id number]) implanted in office by Dr Rayann Heman  . LEXISCAN MYOVIEW  05/13/2012   No ECG changes. EKG negative for ischemia. No significant ischemia demonstrated.  [name] MELANOMA EXCISION Left ~ 09/1979   "back of my shoulder"  . TEE WITHOUT CARDIOVERSION N/A 11/19/2016   Procedure: TRANSESOPHAGEAL ECHOCARDIOGRAM (TEE);  Surgeon: Lelon Perla, MD;  Location: [facility] ENDOSCOPY;  Service: Cardiovascular;  Laterality: N/A;  need CV, but no anesthesia available  . TEE WITHOUT CARDIOVERSION N/A 01/10/2017   Procedure: TRANSESOPHAGEAL ECHOCARDIOGRAM (TEE);  Surgeon: [name], MD;  Location: [facility] ENDOSCOPY;  Service: Cardiovascular;  Laterality: N/A;  . TOTAL KNEE ARTHROPLASTY Right 07/28/2018   Procedure: RIGHT TOTAL KNEE ARTHROPLASTY;  Surgeon: Mcarthur Rossetti, MD;  Location: [facility];  Service: Orthopedics;  Laterality: Right;  . TRANSTHORACIC ECHOCARDIOGRAM  02/18/2012   EF >55%, mild aortic stenosis  . VAGINAL HYSTERECTOMY  1985    FAMHx:  Family History  Problem Relation Age of Onset  . Hypertension Mother   . Diabetes Mother   . Heart failure Father   . Arrhythmia Sister   . Hyperlipidemia Sister   . Hypertension Sister   . Healthy Brother   . Stroke Paternal Grandmother   . Cancer Paternal Grandfather   . Healthy Daughter   . Hypertension Son   . Hyperlipidemia Son     SOCHx:   reports that she has never smoked. She has never used smokeless tobacco. She reports that she does not drink alcohol and does not use drugs.  ALLERGIES:  Allergies  Allergen Reactions  . Amoxicillin Nausea Only  . Crestor [Rosuvastatin] Other (See Comments)    Joints ached  . Lovastatin Other (See Comments)    Joints hurt all over body     ROS: Pertinent items noted in HPI and remainder of  comprehensive ROS otherwise negative.  HOME MEDS: Current Outpatient Medications  Medication Sig Dispense Refill  . acetaminophen (TYLENOL) 500 MG tablet Take 500-1,000 mg by mouth daily.    [name] ALPRAZolam (XANAX) 0.5 MG tablet Take 0.5 mg by mouth 2 (two) times daily.     [name] atorvastatin (LIPITOR) 20 MG tablet TAKE 1 TABLET (20 MG TOTAL) BY MOUTH 3 (THREE) TIMES A WEEK. 36 tablet 3  . buPROPion [facility]  XL) 150 MG 24 hr tablet Take 300 mg by mouth daily.     . Calcium Carbonate-Vitamin D (CALCIUM + D PO) Take 1 tablet by mouth 2 (two) times daily.     . carvedilol (COREG) 12.5 MG tablet Take 1 tablet (12.5 mg total) by mouth 2 (two) times daily. 90 tablet 1  . ELIQUIS 5 MG TABS tablet TAKE 1 TABLET BY MOUTH TWICE A DAY 180 tablet 1  . ferrous gluconate (FERGON) 324 MG tablet     . furosemide (LASIX) 20 MG tablet Take 20 mg by mouth daily. TOTAL 20 MG.    . levothyroxine (SYNTHROID, LEVOTHROID) 88 MCG tablet Take 88 mcg by mouth daily before breakfast.    . Magnesium 500 MG TABS Take 500 mg by mouth daily.     . Multiple Vitamins-Minerals (PRESERVISION AREDS 2) CAPS Take 1 capsule by mouth 2 (two) times daily.    [name] omeprazole (PRILOSEC) 40 MG capsule Take 40 mg by mouth daily.    [name] ULTRA test strip     . Polyethyl Glycol-Propyl Glycol 0.4-0.3 % SOLN Place 2 drops into both eyes 4 (four) times daily.    [name] pyridoxine (B-6) 100 MG tablet Take 100 mg by mouth daily with supper.     [name] rOPINIRole (REQUIP) 0.5 MG tablet Take 0.5 mg by mouth 4 (four) times daily.     [name] spironolactone (ALDACTONE) 25 MG tablet Take 0.5 tablets (12.5 mg total) by mouth daily. 45 tablet 3   No current facility-administered medications for this visit.    LABS/IMAGING: No results found for this or any previous visit (from the past 48 hour(s)). No results found.  VITALS: BP 122/61   Pulse 72   Ht 5\' 5"  (1.651 m)   Wt 197 lb 9.6 oz (89.6 kg)   SpO2 95%   BMI 32.88 kg/m   EXAM: General appearance:  alert and no distress Neck: no carotid bruit, no JVD and thyroid not enlarged, symmetric, no tenderness/mass/nodules Lungs: clear to auscultation bilaterally Heart: regular rate and rhythm, S1, S2 normal and systolic murmur: late systolic 3/6, crescendo at 2nd right intercostal space Abdomen: soft, non-tender; bowel sounds normal; no masses,  no organomegaly Extremities: extremities normal, atraumatic, no cyanosis or edema Pulses: 2+ and symmetric Skin: Skin color, texture, turgor normal. No rashes or lesions Neurologic: Grossly normal Psych: Pleasant  EKG: Normal sinus rhythm at 72-personally reviewed  ASSESSMENT: 1. Paroxysmal atrial fibrillation, s/p catheter ablation (01/2017) - CHADSVASC-4 on Eliquis, s/p ILR without recurrent afib 2. 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 HTN - controlled 4. Dyslipidemia -on low-dose atorvastatin  5. Moderate to severe aortic stenosis-mean gradient 34 mmHg (02/2021) 6. History of anxiety/depression  PLAN: 1.  Jenna [name] has had interval progression of her aortic stenosis with now a mean gradient of 34 mmHg.  I can still auscultate a second heart sound.  I would like to repeat an echo in about 6 months.  Ultimately if she does have severe aortic stenosis, she would like to have referral to Dr. Angelena Form in the comprehensive valve clinic for further evaluation for possible TAVR.  Plan follow-up in 6 months with an echo or sooner as necessary.  [name], MD, [facility], [place] Director of the Advanced Lipid Disorders &  Cardiovascular Risk Reduction Clinic Diplomate of the American Board of Clinical Lipidology Attending Cardiologist  Direct Dial: [phone]  Fax: [phone]  Website:  [email or web address]  Nadean Corwin Jovonne Wilton 02/15/2021, 2:21 PM

## 2021-02-18 ENCOUNTER — Emergency Department (HOSPITAL_COMMUNITY): Payer: Medicare PPO

## 2021-02-18 ENCOUNTER — Other Ambulatory Visit: Payer: Self-pay

## 2021-02-18 ENCOUNTER — Emergency Department (HOSPITAL_COMMUNITY)
Admission: EM | Admit: 2021-02-18 | Discharge: 2021-02-19 | Disposition: A | Discharge status: Home / Self Care | Payer: Medicare PPO | Attending: Emergency Medicine | Admitting: Emergency Medicine

## 2021-02-18 DIAGNOSIS — M25531 Pain in right wrist: Secondary | ICD-10-CM

## 2021-02-18 DIAGNOSIS — S0083XA Contusion of other part of head, initial encounter: Secondary | ICD-10-CM

## 2021-02-18 DIAGNOSIS — W19XXXA Unspecified fall, initial encounter: Secondary | ICD-10-CM

## 2021-02-18 DIAGNOSIS — S0990XA Unspecified injury of head, initial encounter: Secondary | ICD-10-CM | POA: Diagnosis present

## 2021-02-18 DIAGNOSIS — Z7901 Long term (current) use of anticoagulants: Secondary | ICD-10-CM | POA: Insufficient documentation

## 2021-02-18 DIAGNOSIS — S0081XA Abrasion of other part of head, initial encounter: Secondary | ICD-10-CM | POA: Insufficient documentation

## 2021-02-18 DIAGNOSIS — S6991XA Unspecified injury of right wrist, hand and finger(s), initial encounter: Secondary | ICD-10-CM | POA: Diagnosis not present

## 2021-02-18 NOTE — ED Notes (Signed)
Pt to CT and Xray with TRN

## 2021-02-18 NOTE — Discharge Instructions (Addendum)
You have been seen and discharged from the emergency department.  Your CT imaging was negative.  There was no fracture in your wrist x-ray, questionable chip fracture of the right elbow.  Wear your wrist splint, take Tylenol for pain control.  Follow-up with your orthopedic doctor, Dr. Ninfa Linden.  Follow-up with your primary provider for reevaluation and further care. Take home medications as prescribed. If you have any worsening symptoms or further concerns for your health please return to an emergency department for further evaluation.

## 2021-02-18 NOTE — ED Notes (Signed)
Pt's son updated on status.

## 2021-02-18 NOTE — Progress Notes (Signed)
Chaplain responded to Trauma L2 fall on blood thinners.  Pt is not available.  EMT indicates that family is en route.    Please contact if our department can support this patient or family.   Luana Shu [phone]    02/18/21 1600  Clinical Encounter Type  Visited With Patient not available  Visit Type Initial;Trauma  Referral From Nurse  Consult/Referral To Chaplain  Stress Factors  Patient Stress Factors Not reviewed

## 2021-02-18 NOTE — ED Provider Notes (Signed)
[place] EMERGENCY DEPARTMENT Provider Note   CSN: [id number] Arrival date & time: 02/18/21  1637     History No chief complaint on file.   Jenna [name] is a 80 y.o. female.  HPI   80 year old female presents the emergency department as a level 2 trauma fall on blood thinners.  She is on Eliquis.  Prior to arrival patient was out in her garden, states that she missed a step down and fell onto her right side catching herself with her right wrist and hitting the right side of her face.  No loss of consciousness.  No preceding chest pain, dizziness, shortness of breath or syncope.  Patient is currently complaining of right wrist pain.  Denies any face, head, neck pain.  No past medical history on file.  There are no problems to display for this patient.     OB History   No obstetric history on file.     No family history on file.     Home Medications Prior to Admission medications   Not on File    Allergies    Patient has no allergy information on record.  Review of Systems   Review of Systems  Constitutional: Negative for fever.  HENT: Negative for trouble swallowing.   Eyes: Negative for pain.  Respiratory: Negative for shortness of breath.   Cardiovascular: Negative for chest pain.  Gastrointestinal: Negative for abdominal pain.  Musculoskeletal: Negative for back pain and neck pain.       + right wrist pain  Skin: Positive for wound.  Neurological: Negative for headaches.    Physical Exam Updated Vital Signs BP 126/88   Pulse (!) 102   Temp 98.1 F (36.7 C) (Oral)   Resp 20   Ht 5\' 6"  (1.676 m)   Wt 87.5 kg   SpO2 97%   BMI 31.15 kg/m   Physical Exam Vitals and nursing note reviewed.  Constitutional:      General: She is not in acute distress.    Appearance: She is not ill-appearing.  HENT:     Head: Normocephalic.     Comments: Midface is stable    Right Ear: External ear normal.     Left Ear: External ear normal.      Nose: Nose normal.  Eyes:     Extraocular Movements: Extraocular movements intact.     Conjunctiva/sclera: Conjunctivae normal.     Pupils: Pupils are equal, round, and reactive to light.  Cardiovascular:     Rate and Rhythm: Normal rate.  Pulmonary:     Effort: Pulmonary effort is normal.  Abdominal:     General: Abdomen is flat.     Palpations: Abdomen is soft.     Tenderness: There is no abdominal tenderness.     Comments: No seat belt sign  Musculoskeletal:        General: No deformity.     Cervical back: No tenderness.     Comments: Pelvis is stable, TTP of right wrist, neurovasc in tact  Skin:    General: Skin is warm.     Comments: Superficial linear abrasion to the right temple area, bleeding controlled  Neurological:     Mental Status: She is alert and oriented to person, place, and time.     ED Results / Procedures / Treatments   Labs (all labs ordered are listed, but only abnormal results are displayed) Labs Reviewed - No data to display  EKG None  Radiology No results  found.  Procedures .Critical Care Performed by: Lorelle Gibbs, DO Authorized by: Lorelle Gibbs, DO   Critical care provider statement:    Critical care time (minutes):  45   Critical care was necessary to treat or prevent imminent or life-threatening deterioration of the following conditions:  Trauma   Critical care was time spent personally by me on the following activities:  Discussions with consultants, evaluation of patient's response to treatment, examination of patient, ordering and performing treatments and interventions, ordering and review of laboratory studies, ordering and review of radiographic studies, pulse oximetry, re-evaluation of patient's condition, obtaining history from patient or surrogate and review of old charts   I assumed direction of critical care for this patient from another provider in my specialty: no       Medications Ordered in ED Medications -  No data to display  ED Course  I have reviewed the triage vital signs and the nursing notes.  Pertinent labs & imaging results that were available during my care of the patient were reviewed by me and considered in my medical decision making (see chart for details).    MDM Rules/Calculators/A&P                          80 year old female presents the emergency department as a level 2 trauma fall on thinners.  She had a mechanical fall just prior to arrival with right head and right wrist injury/pain.  No syncopal component.  Vitals are stable on arrival, physical exam shows small abrasion to the right temporal area and tenderness to palpation at the base of the fourth and fifth metacarpal.  Plan for CT and x-ray imaging.  Given the mechanical aspect of this I do not feel any blood work or other work-up is warranted at this time.  CT imaging is negative.  X-ray imaging shows no wrist fracture.  There is a small artifact around the radial head that could potentially be a chip fracture however patient has no point tenderness in this area.  Lower suspicion for an acute fracture.  Patient continues to have right wrist pain, no snuffbox tenderness.  Plan for right wrist brace.  She already follows with Dr. Ninfa Linden for orthopedics, she will call their office tomorrow for follow-up.  Patient will be discharged and treated as an outpatient.  Discharge plan and strict return to ED precautions discussed, patient verbalizes understanding and agreement.  Final Clinical Impression(s) / ED Diagnoses Final diagnoses:  None    Rx / DC Orders ED Discharge Orders    None       Lorelle Gibbs, DO 02/18/21 2257

## 2021-02-18 NOTE — ED Notes (Addendum)
Pt arrived via [facility].  Pt fell while outside in garden.  She hit R eyebrow and has 2 in lac.  Pt also complains of R wrist pain.  No LOC.  A&Ox4. Pt is on Eliquis

## 2021-02-18 NOTE — ED Notes (Signed)
Patient transported to X-ray 

## 2021-02-19 NOTE — Progress Notes (Signed)
Orthopedic Tech Progress Note Patient Details:  Jenna [name] Jan 25, 1941 [id number]  Ortho Devices Type of Ortho Device: Velcro wrist splint Ortho Device/Splint Location: rue Ortho Device/Splint Interventions: Ordered,Application,Adjustment   Post Interventions Patient Tolerated: Well Instructions Provided: Care of device,Adjustment of device   Karolee Stamps 02/19/2021, 4:35 AM

## 2021-02-20 ENCOUNTER — Encounter: Payer: Self-pay | Admitting: Orthopaedic Surgery

## 2021-02-20 ENCOUNTER — Other Ambulatory Visit: Payer: Self-pay

## 2021-02-20 ENCOUNTER — Ambulatory Visit: Payer: Medicare PPO | Admitting: Orthopaedic Surgery

## 2021-02-20 DIAGNOSIS — M1811 Unilateral primary osteoarthritis of first carpometacarpal joint, right hand: Secondary | ICD-10-CM

## 2021-02-20 DIAGNOSIS — S63501A Unspecified sprain of right wrist, initial encounter: Secondary | ICD-10-CM | POA: Diagnosis not present

## 2021-02-20 NOTE — Progress Notes (Addendum)
Office Visit Note   Patient: Jenna [name]           Date of Birth: Apr 18, 1941           MRN: [id number] Visit Date: 02/20/2021              Requested by: Chesley Noon, MD [place],  [place] [address] PCP: Chesley Noon, MD   Assessment & Plan: Visit Diagnoses:  1. Arthritis of carpometacarpal (CMC) joint of right thumb   2. Wrist sprain, right, initial encounter     Plan: We will send her to formal physical therapy to work on range of motion of the hand and wrist they will include modalities and a home exercise program.  She can wear the removable Velcro wrist splint for comfort.  She is encouraged to come out of it daily to work on range of motion of the hand and wrist. Also discussed with her shoewear she is wearing shoes that are too large a lot of the times especially ham recommend that she find proper fitting shoes with good arch support to wear even in the home to hopefully prevent future fall.  Follow-Up Instructions: Return in about 4 weeks (around 03/20/2021).   Orders:  No orders of the defined types were placed in this encounter.  No orders of the defined types were placed in this encounter.     Procedures: No procedures performed   Clinical Data: No additional findings.   Subjective: Chief Complaint  Patient presents with  . Right Wrist - Injury    HPI Jenna [name] 80 year old female well-known to Dr. Ninfa Linden service comes in today status post fall on 02/18/2014.  She states that she does tripped while wearing flip-flops.  She is seen in the ER for laceration over right eyebrow area and also right wrist pain.  She denies any loss of consciousness.  She is concerned about possible foot drop on the right side as the cause of her fall.  Denies any pain in the right wrist prior to the fall.  She notes significant swelling.  She is in a removable Velcro wrist splint which does provide her with some pain relief. Radiographs right wrist and  right elbow are reviewed on epic.  These show no acute fractures.  No subluxation dislocation involving the wrist elbow and the carpal bones.  Severe first [place] joint arthritic changes with extensive remodeling in the trapezium bone.  Small possible avulsion fracture seen just anterior to distal radius and ulna.  No other acute fracture or bony abnormalities noted.  Review of Systems See HPI  Objective: Vital Signs: There were no vitals taken for this visit.  Physical Exam Constitutional:      Appearance: She is not ill-appearing or diaphoretic.  Cardiovascular:     Pulses: Normal pulses.  Pulmonary:     Effort: Pulmonary effort is normal.  Neurological:     Mental Status: She is alert and oriented to person, place, and time.  Psychiatric:        Mood and Affect: Mood normal.     Ortho Exam Right wrist she has pain over the dorsal mid right wrist joint.  Tenderness over the right CMC joint.  She has decreased extension of the right thumb secondary to pain.  Edema throughout right hand.  Sensation grossly intact throughout the right hand.  No rashes skin lesions ulcerations.  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  Nontender over the proximal radius ulna has  full extension flexion of the elbow full supination pronation forearm. Right foot she has full dorsiflexion plantarflexion at the ankle.  Slight weakness with extension of the right great toe against resistance.  Specialty Comments:  No specialty comments available.  Imaging: No results found.   PMFS History: Patient Active Problem List   Diagnosis Date Noted  . Facet arthropathy 07/19/2020  . Spondylosis without myelopathy or radiculopathy, lumbar region 12/07/2019  . Chronic back pain 10/28/2019  . Chronic kidney disease (CKD), stage III (moderate) ([place]) 09/21/2019  . Moderate episode of recurrent major depressive disorder ([place]) 09/21/2019  . Primary insomnia 05/31/2019  . Hyperlipidemia 03/03/2019  . Peripheral  edema 11/03/2018  . Status post total knee replacement, right 07/28/2018  . Primary osteoarthritis of right knee 12/08/2017  . Nonischemic cardiomyopathy ([place]) 05/14/2017  . S/P ablation of atrial flutter/ fib 01/10/17 01/11/2017  . A-fib ([place]) 01/10/2017  . Weakness 12/17/2016  . Acute kidney injury superimposed on chronic kidney disease ([place]) 12/17/2016  . Hyperglycemia 12/17/2016  . Normocytic anemia 12/17/2016  . Acute kidney injury ([place]) 12/08/2016  . Acute on chronic systolic congestive heart failure ([place]) 12/08/2016  . Hyperkalemia 12/08/2016  . Cystitis   . Persistent atrial fibrillation ([place]) 11/15/2016  . Depressed 07/04/2015  . Peripheral vertigo 12/16/2014  . Vertigo 12/16/2014  . Aortic valve stenosis 07/26/2013  . HTN (hypertension) 07/26/2013  . Hypothyroidism 07/26/2013  . PAF (paroxysmal atrial fibrillation) ([place]) 07/26/2013  . Anxiety 07/26/2013  . Aortic valve disorder 07/26/2013  . GERD (gastroesophageal reflux disease) 07/26/2013  . OA (osteoarthritis) 07/26/2013  . Obstructive sleep apnea 06/20/2008  . RESTLESS LEGS SYNDROME 06/20/2008   Past Medical History:  Diagnosis Date  . Anxiety   . CHF (congestive heart failure) ([place])   . Chronic lower back pain   . Chronic systolic dysfunction of left ventricle   . Depression   . Dysrhythmia    A-Fib  . Fatigue   . GERD (gastroesophageal reflux disease)   . High cholesterol   . Hypertension   . Hypothyroidism   . Melanoma of shoulder, left ([place])   . Mild aortic stenosis   . Nonischemic cardiomyopathy ([place])   . OSA on CPAP    reports compliance with CPAP  . Osteoarthritis   . Persistent atrial fibrillation with RVR ([place])    [name] 11/15/2016  . PONV (postoperative nausea and vomiting)   . RLS (restless legs syndrome)   . S/P ablation of atrial flutter/ fib 01/10/17 01/11/2017  . Type II diabetes mellitus (HCC)    no medications    Family History  Problem Relation Age of Onset  . Hypertension Mother   .  Diabetes Mother   . Heart failure Father   . Arrhythmia Sister   . Hyperlipidemia Sister   . Hypertension Sister   . Healthy Brother   . Stroke Paternal Grandmother   . Cancer Paternal Grandfather   . Healthy Daughter   . Hypertension Son   . Hyperlipidemia Son     Past Surgical History:  Procedure Laterality Date  . ATRIAL FIBRILLATION ABLATION N/A 01/10/2017   Procedure: Atrial Fibrillation Ablation;  Surgeon: Thompson Grayer, MD;  Location: [facility];  Service: Cardiovascular;  Laterality: N/A;  . CARDIOVERSION N/A 11/19/2016   Procedure: CARDIOVERSION;  Surgeon: Lelon Perla, MD;  Location: [facility] ENDOSCOPY;  Service: Cardiovascular;  Laterality: N/A;  . CARDIOVERSION N/A 12/11/2016   Procedure: CARDIOVERSION;  Surgeon: Dorothy Spark, MD;  Location: [facility];  Service:  Cardiovascular;  Laterality: N/A;  . CARDIOVERSION N/A 12/18/2016   Procedure: CARDIOVERSION;  Surgeon: Skeet Latch, MD;  Location: [facility] ENDOSCOPY;  Service: Cardiovascular;  Laterality: N/A;  . CATARACT EXTRACTION W/ INTRAOCULAR LENS  IMPLANT, BILATERAL Bilateral   . DILATION AND CURETTAGE OF UTERUS    . implantable loop recorder placement  12/27/2019   Medtronic Reveal Linq model LNQ 11 (SN RLA [id number]) implanted in office by Dr Rayann Heman  . LEXISCAN MYOVIEW  05/13/2012   No ECG changes. EKG negative for ischemia. No significant ischemia demonstrated.  [name] MELANOMA EXCISION Left ~ 09/1979   "back of my shoulder"  . TEE WITHOUT CARDIOVERSION N/A 11/19/2016   Procedure: TRANSESOPHAGEAL ECHOCARDIOGRAM (TEE);  Surgeon: Lelon Perla, MD;  Location: [facility] ENDOSCOPY;  Service: Cardiovascular;  Laterality: N/A;  need CV, but no anesthesia available  . TEE WITHOUT CARDIOVERSION N/A 01/10/2017   Procedure: TRANSESOPHAGEAL ECHOCARDIOGRAM (TEE);  Surgeon: [name], MD;  Location: [facility] ENDOSCOPY;  Service: Cardiovascular;  Laterality: N/A;  . TOTAL KNEE ARTHROPLASTY Right 07/28/2018   Procedure: RIGHT TOTAL KNEE  ARTHROPLASTY;  Surgeon: Mcarthur Rossetti, MD;  Location: [facility];  Service: Orthopedics;  Laterality: Right;  . TRANSTHORACIC ECHOCARDIOGRAM  02/18/2012   EF >55%, mild aortic stenosis  . VAGINAL HYSTERECTOMY  1985   Social History   Occupational History  . Not on file  Tobacco Use  . Smoking status: Never Smoker  . Smokeless tobacco: Never Used  Vaping Use  . Vaping Use: Never used  Substance and Sexual Activity  . Alcohol use: No  . Drug use: Never  . Sexual activity: Never

## 2021-02-23 NOTE — Progress Notes (Signed)
[facility] Summary Report / Loop Recorder 

## 2021-02-27 ENCOUNTER — Telehealth: Payer: Self-pay | Admitting: Internal Medicine

## 2021-02-27 ENCOUNTER — Telehealth: Payer: Self-pay | Admitting: Orthopaedic Surgery

## 2021-02-27 ENCOUNTER — Telehealth: Payer: Self-pay

## 2021-02-27 ENCOUNTER — Other Ambulatory Visit: Payer: Self-pay

## 2021-02-27 DIAGNOSIS — M1811 Unilateral primary osteoarthritis of first carpometacarpal joint, right hand: Secondary | ICD-10-CM

## 2021-02-27 NOTE — Telephone Encounter (Signed)
Faxed rx and office note to [facility]

## 2021-02-27 NOTE — Telephone Encounter (Signed)
Pt c/o swelling: STAT is pt has developed SOB within 24 hours  1) How much weight have you gained and in what time span? Not sure  2) If swelling, where is the swelling located? Right legs   3) Are you currently taking a fluid pill? Yes last two weeks   4) Are you currently SOB?   5) Do you have a log of your daily weights (if so, list)? No   Have you gained 3 pounds in a day or 5 pounds in a week? No  6) Have you traveled recently? No

## 2021-02-27 NOTE — Telephone Encounter (Signed)
Kiaya from [facility] called requesting patient prescription be faxed to [phone]. Please call [place] if any questions at [phone].

## 2021-02-27 NOTE — Telephone Encounter (Signed)
Spoke to patient. She states she is still having continue swelling - mainly right leg from  below the knee to ankle. Patient states it barely goes down at night. She states when her leg swell like this she develop a rash. Which she states she has small bumps laterally and sometime redness but not now she states when she walks  "leg is shaky to walk on". She als states she keeps legs elevated when sitting. She has been up since 4 am . She states leg is aready swollen.  She states she has taken the increase dose of Furosemide 40 mg as order from last office note on 02/15/21. She is also taking Spironolactone 12.5 mg.  She will have BMP ,BNP drawn today instead of waiting to Thursday 03/01/21.   She wanted to know to do?  RN instructed patient to have labs doe today so Dr Debara Pickett will have  Result to make any changes if think it is necessary. Patient aware will defer to Dr Debara Pickett and will call back with information. Patient is aware he is out of the office today .

## 2021-02-27 NOTE — Telephone Encounter (Signed)
[facility] PT would like OP note if any and Rx faxed to [phone].  Stated that patient has an appointment today.  Cb# [phone].  Please advise.  Thank you.

## 2021-02-27 NOTE — Telephone Encounter (Signed)
Thanks Ivin Booty- I will review labs when available.  Dr Lemmie Evens

## 2021-02-27 NOTE — Telephone Encounter (Signed)
No sx done correct?

## 2021-02-28 LAB — BRAIN NATRIURETIC PEPTIDE: BNP: 102.2 pg/mL — ABNORMAL HIGH (ref 0.0–100.0)

## 2021-02-28 LAB — BASIC METABOLIC PANEL
BUN/Creatinine Ratio: 22 (ref 12–28)
BUN: 24 mg/dL (ref 8–27)
CO2: 29 mmol/L (ref 20–29)
Calcium: 9.7 mg/dL (ref 8.7–10.3)
Chloride: 93 mmol/L — ABNORMAL LOW (ref 96–106)
Creatinine, Ser: 1.1 mg/dL — ABNORMAL HIGH (ref 0.57–1.00)
Glucose: 111 mg/dL — ABNORMAL HIGH (ref 65–99)
Potassium: 4.4 mmol/L (ref 3.5–5.2)
Sodium: 138 mmol/L (ref 134–144)
eGFR: 51 mL/min/{1.73_m2} — ABNORMAL LOW (ref >59)

## 2021-03-02 ENCOUNTER — Telehealth: Payer: Self-pay | Admitting: Internal Medicine

## 2021-03-02 NOTE — Telephone Encounter (Signed)
[name], MD  03/01/2021 3:55 PM EDT Back to Top     Stable BMET - essentially normal BNP. Remain on lasix 40 mg daily  Dr Lemmie Evens   Patient called w/results Voiced understanding Routed to PCP

## 2021-03-02 NOTE — Telephone Encounter (Signed)
Patient received result of labs and Dr Debara Pickett instructions

## 2021-03-02 NOTE — Telephone Encounter (Signed)
PT CALLED IN FOR RESULTS OF LABS ON 02/15/21, DOESN'T HAVE A COMPUTER TO ACCESS MY CHART, WOULD LIKE NURSE TO CONTACT HER

## 2021-03-06 ENCOUNTER — Encounter: Payer: Self-pay | Admitting: Internal Medicine

## 2021-03-11 LAB — CUP PACEART REMOTE DEVICE CHECK
Date Time Interrogation Session: 20220602232126
Implantable Pulse Generator Implant Date: 20210322

## 2021-03-12 ENCOUNTER — Ambulatory Visit (INDEPENDENT_AMBULATORY_CARE_PROVIDER_SITE_OTHER): Payer: Medicare PPO

## 2021-03-12 DIAGNOSIS — I428 Other cardiomyopathies: Secondary | ICD-10-CM

## 2021-03-16 ENCOUNTER — Telehealth: Payer: Self-pay | Admitting: Internal Medicine

## 2021-03-16 NOTE — Telephone Encounter (Signed)
Pt called in and has couple question about her Looper recorder and a life line.  She would like to know if the the magnet would mess up the life line that she would wear around her neck ?    Best number [phone]

## 2021-03-16 NOTE — Telephone Encounter (Signed)
Patient informed it will be OK to wear life alert  with loop implant.

## 2021-03-23 ENCOUNTER — Other Ambulatory Visit: Payer: Self-pay | Admitting: Family Medicine

## 2021-03-23 DIAGNOSIS — Z1231 Encounter for screening mammogram for malignant neoplasm of breast: Secondary | ICD-10-CM

## 2021-03-26 ENCOUNTER — Other Ambulatory Visit: Payer: Self-pay

## 2021-03-26 ENCOUNTER — Encounter: Payer: Self-pay | Admitting: Orthopaedic Surgery

## 2021-03-26 ENCOUNTER — Ambulatory Visit (INDEPENDENT_AMBULATORY_CARE_PROVIDER_SITE_OTHER): Payer: Medicare PPO | Admitting: Orthopaedic Surgery

## 2021-03-26 DIAGNOSIS — S63501A Unspecified sprain of right wrist, initial encounter: Secondary | ICD-10-CM

## 2021-03-26 NOTE — Progress Notes (Signed)
The patient is following up after having a sprain to her right wrist after a mechanical fall.  She is 80 years old and does ambulate with a quad cane.  She has significant basilar thumb joint arthritis on the right side.  She does feel that therapy at [facility] is helped and she is doing okay overall.  There is definitely painful grinding at the basilar thumb joint but improved motion of her wrist and hand and overall.  She is got better pinch strength and grip strength.  She is not interested in any other referral or intervention given that she feels like she is getting close to her baseline.  She states that this may be something she just learned to live with.  Obviously if things worsen she knows to give [place] a call.  All questions and concerns were answered and addressed.  Follow-up can be as needed.

## 2021-04-03 NOTE — Progress Notes (Signed)
[facility] Summary Report / Loop Recorder 

## 2021-04-15 LAB — CUP PACEART REMOTE DEVICE CHECK
Date Time Interrogation Session: 20220705233455
Implantable Pulse Generator Implant Date: 20210322

## 2021-04-16 ENCOUNTER — Ambulatory Visit (INDEPENDENT_AMBULATORY_CARE_PROVIDER_SITE_OTHER): Payer: Medicare PPO

## 2021-04-16 DIAGNOSIS — I428 Other cardiomyopathies: Secondary | ICD-10-CM

## 2021-05-09 NOTE — Progress Notes (Signed)
[facility] Summary Report / Loop Recorder 

## 2021-05-17 ENCOUNTER — Ambulatory Visit
Admission: RE | Admit: 2021-05-17 | Discharge: 2021-05-17 | Disposition: A | Discharge status: Home / Self Care | Payer: Medicare PPO | Source: Ambulatory Visit | Attending: Family Medicine | Admitting: Family Medicine

## 2021-05-17 ENCOUNTER — Other Ambulatory Visit: Payer: Self-pay

## 2021-05-17 DIAGNOSIS — Z1231 Encounter for screening mammogram for malignant neoplasm of breast: Secondary | ICD-10-CM

## 2021-05-21 ENCOUNTER — Ambulatory Visit (INDEPENDENT_AMBULATORY_CARE_PROVIDER_SITE_OTHER): Payer: Medicare PPO

## 2021-05-21 ENCOUNTER — Other Ambulatory Visit: Payer: Self-pay | Admitting: Family Medicine

## 2021-05-21 DIAGNOSIS — I428 Other cardiomyopathies: Secondary | ICD-10-CM

## 2021-05-21 DIAGNOSIS — R928 Other abnormal and inconclusive findings on diagnostic imaging of breast: Secondary | ICD-10-CM

## 2021-05-22 LAB — CUP PACEART REMOTE DEVICE CHECK
Date Time Interrogation Session: 20220808005555
Implantable Pulse Generator Implant Date: 20210322

## 2021-05-28 ENCOUNTER — Other Ambulatory Visit: Payer: Medicare PPO

## 2021-06-04 ENCOUNTER — Other Ambulatory Visit: Payer: Medicare PPO

## 2021-06-08 ENCOUNTER — Ambulatory Visit
Admission: RE | Admit: 2021-06-08 | Discharge: 2021-06-08 | Disposition: A | Discharge status: Home / Self Care | Payer: Medicare PPO | Source: Ambulatory Visit | Attending: Family Medicine | Admitting: Family Medicine

## 2021-06-08 ENCOUNTER — Other Ambulatory Visit: Payer: Self-pay

## 2021-06-08 ENCOUNTER — Ambulatory Visit: Payer: Medicare PPO

## 2021-06-08 DIAGNOSIS — R928 Other abnormal and inconclusive findings on diagnostic imaging of breast: Secondary | ICD-10-CM

## 2021-06-08 NOTE — Progress Notes (Signed)
[facility] Summary Report / Loop Recorder 

## 2021-06-20 LAB — CUP PACEART REMOTE DEVICE CHECK
Date Time Interrogation Session: 20220910005405
Implantable Pulse Generator Implant Date: 20210322

## 2021-06-25 ENCOUNTER — Ambulatory Visit (INDEPENDENT_AMBULATORY_CARE_PROVIDER_SITE_OTHER): Payer: Medicare PPO

## 2021-06-25 DIAGNOSIS — I428 Other cardiomyopathies: Secondary | ICD-10-CM

## 2021-06-28 ENCOUNTER — Other Ambulatory Visit: Payer: Self-pay | Admitting: Nephrology

## 2021-06-28 DIAGNOSIS — N189 Chronic kidney disease, unspecified: Secondary | ICD-10-CM

## 2021-06-28 DIAGNOSIS — I129 Hypertensive chronic kidney disease with stage 1 through stage 4 chronic kidney disease, or unspecified chronic kidney disease: Secondary | ICD-10-CM

## 2021-06-28 DIAGNOSIS — N1831 Chronic kidney disease, stage 3a: Secondary | ICD-10-CM

## 2021-06-28 DIAGNOSIS — N2581 Secondary hyperparathyroidism of renal origin: Secondary | ICD-10-CM

## 2021-06-28 DIAGNOSIS — D631 Anemia in chronic kidney disease: Secondary | ICD-10-CM

## 2021-06-29 NOTE — Progress Notes (Signed)
[facility] Summary Report / Loop Recorder 

## 2021-07-02 ENCOUNTER — Encounter: Payer: Medicare PPO | Admitting: Internal Medicine

## 2021-07-06 ENCOUNTER — Other Ambulatory Visit: Payer: Medicare PPO

## 2021-07-12 ENCOUNTER — Ambulatory Visit
Admission: RE | Admit: 2021-07-12 | Discharge: 2021-07-12 | Disposition: A | Discharge status: Home / Self Care | Payer: Medicare PPO | Source: Ambulatory Visit | Attending: Nephrology | Admitting: Nephrology

## 2021-07-12 DIAGNOSIS — I129 Hypertensive chronic kidney disease with stage 1 through stage 4 chronic kidney disease, or unspecified chronic kidney disease: Secondary | ICD-10-CM

## 2021-07-12 DIAGNOSIS — N1831 Chronic kidney disease, stage 3a: Secondary | ICD-10-CM

## 2021-07-12 DIAGNOSIS — D631 Anemia in chronic kidney disease: Secondary | ICD-10-CM

## 2021-07-12 DIAGNOSIS — N2581 Secondary hyperparathyroidism of renal origin: Secondary | ICD-10-CM

## 2021-07-16 ENCOUNTER — Other Ambulatory Visit: Payer: Self-pay

## 2021-07-16 ENCOUNTER — Ambulatory Visit (INDEPENDENT_AMBULATORY_CARE_PROVIDER_SITE_OTHER): Payer: Medicare PPO | Admitting: Internal Medicine

## 2021-07-16 VITALS — BP 108/64 | HR 71 | Ht 61.0 in | Wt 169.4 lb

## 2021-07-16 DIAGNOSIS — I10 Essential (primary) hypertension: Secondary | ICD-10-CM

## 2021-07-16 DIAGNOSIS — G4733 Obstructive sleep apnea (adult) (pediatric): Secondary | ICD-10-CM

## 2021-07-16 DIAGNOSIS — I4819 Other persistent atrial fibrillation: Secondary | ICD-10-CM | POA: Diagnosis not present

## 2021-07-16 DIAGNOSIS — I43 Cardiomyopathy in diseases classified elsewhere: Secondary | ICD-10-CM

## 2021-07-16 DIAGNOSIS — R000 Tachycardia, unspecified: Secondary | ICD-10-CM

## 2021-07-16 MED ORDER — CARVEDILOL 6.25 MG PO TABS: 6.2500 mg | ORAL_TABLET | Freq: Two times a day (BID) | ORAL | 3 refills | Status: DC

## 2021-07-16 NOTE — Patient Instructions (Addendum)
Medication Instructions:  Reduce Carvedilol to 6.25 mg two times a day Your physician recommends that you continue on your current medications as directed. Please refer to the Current Medication list given to you today. *If you need a refill on your cardiac medications before your next appointment, please call your pharmacy*  Lab Work: None. If you have labs (blood work) drawn today and your tests are completely normal, you will receive your results only by: [place] (if you have MyChart) OR A paper copy in the mail If you have any lab test that is abnormal or we need to change your treatment, we will call you to review the results.  Testing/Procedures: None.  Follow-Up: At [facility], you and your health needs are our priority.  As part of our continuing mission to provide you with exceptional heart care, we have created designated Provider Care Teams.  These Care Teams include your primary Cardiologist (physician) and Advanced Practice Providers (APPs -  Physician Assistants and Nurse Practitioners) who all work together to provide you with the care you need, when you need it.  Your physician wants you to follow-up in: 12 months with  Thompson Grayer, MD or one of the following Advanced Practice Providers on your designated Care Team:    Tommye Standard, [place] Legrand [place] "Jonni Sanger" [place], [place]   You will receive a reminder letter in the mail two months in advance. If you don't receive a letter, please call our office to schedule the follow-up appointment.  We recommend signing up for the patient portal called "MyChart".  Sign up information is provided on this After Visit Summary.  MyChart is used to connect with patients for Virtual Visits (Telemedicine).  Patients are able to view lab/test results, encounter notes, upcoming appointments, etc.  Non-urgent messages can be sent to your provider as well.   To learn more about what you can do with MyChart, go to [email or web address].     Any Other Special Instructions Will Be Listed Below (If Applicable).

## 2021-07-16 NOTE — Progress Notes (Signed)
PCP: Chesley Noon, MD Primary Cardiologist: Dr Debara Pickett Primary EP: Dr Erskine Speed Jenna [name] is a 80 y.o. female who presents today for routine electrophysiology followup.  Since last being seen in our clinic, the patient reports doing very well.  Today, she denies symptoms of palpitations, chest pain, shortness of breath,  lower extremity edema, dizziness, presyncope, or syncope.  The patient is otherwise without complaint today.   Past Medical History:  Diagnosis Date   Anxiety    CHF (congestive heart failure) (HCC)    Chronic lower back pain    Chronic systolic dysfunction of left ventricle    Depression    Dysrhythmia    A-Fib   Fatigue    GERD (gastroesophageal reflux disease)    High cholesterol    Hypertension    Hypothyroidism    Melanoma of shoulder, left (HCC)    Mild aortic stenosis    Nonischemic cardiomyopathy (HCC)    OSA on CPAP    reports compliance with CPAP   Osteoarthritis    Persistent atrial fibrillation with RVR ([place])    [name] 11/15/2016   PONV (postoperative nausea and vomiting)    RLS (restless legs syndrome)    S/P ablation of atrial flutter/ fib 01/10/17 01/11/2017   Type II diabetes mellitus ([place])    no medications   Past Surgical History:  Procedure Laterality Date   ATRIAL FIBRILLATION ABLATION N/A 01/10/2017   Procedure: Atrial Fibrillation Ablation;  Surgeon: Thompson Grayer, MD;  Location: [facility] LAB;  Service: Cardiovascular;  Laterality: N/A;   CARDIOVERSION N/A 11/19/2016   Procedure: CARDIOVERSION;  Surgeon: Lelon Perla, MD;  Location: [facility] ENDOSCOPY;  Service: Cardiovascular;  Laterality: N/A;   CARDIOVERSION N/A 12/11/2016   Procedure: CARDIOVERSION;  Surgeon: Dorothy Spark, MD;  Location: [facility];  Service: Cardiovascular;  Laterality: N/A;   CARDIOVERSION N/A 12/18/2016   Procedure: CARDIOVERSION;  Surgeon: Skeet Latch, MD;  Location: [facility];  Service: Cardiovascular;  Laterality: N/A;   CATARACT EXTRACTION  W/ INTRAOCULAR LENS  IMPLANT, BILATERAL Bilateral    DILATION AND CURETTAGE OF UTERUS     implantable loop recorder placement  12/27/2019   Medtronic Reveal Linq model AQT 62 (SN RLA [id number]) implanted in office by Dr Clide Deutscher MYOVIEW  05/13/2012   No ECG changes. EKG negative for ischemia. No significant ischemia demonstrated.   MELANOMA EXCISION Left ~ 09/1979   "back of my shoulder"   TEE WITHOUT CARDIOVERSION N/A 11/19/2016   Procedure: TRANSESOPHAGEAL ECHOCARDIOGRAM (TEE);  Surgeon: Lelon Perla, MD;  Location: [facility] ENDOSCOPY;  Service: Cardiovascular;  Laterality: N/A;  need CV, but no anesthesia available   TEE WITHOUT CARDIOVERSION N/A 01/10/2017   Procedure: TRANSESOPHAGEAL ECHOCARDIOGRAM (TEE);  Surgeon: [name], MD;  Location: [facility] ENDOSCOPY;  Service: Cardiovascular;  Laterality: N/A;   TOTAL KNEE ARTHROPLASTY Right 07/28/2018   Procedure: RIGHT TOTAL KNEE ARTHROPLASTY;  Surgeon: Mcarthur Rossetti, MD;  Location: [facility];  Service: Orthopedics;  Laterality: Right;   TRANSTHORACIC ECHOCARDIOGRAM  02/18/2012   EF >55%, mild aortic stenosis   VAGINAL HYSTERECTOMY  1985    ROS- all systems are reviewed and negatives except as per HPI above  Current Outpatient Medications  Medication Sig Dispense Refill   acetaminophen (TYLENOL) 500 MG tablet Take 500-1,000 mg by mouth daily.     ALPRAZolam (XANAX) 0.5 MG tablet Take 0.5 mg by mouth 2 (two) times daily.      atorvastatin (LIPITOR) 20 MG tablet  TAKE 1 TABLET (20 MG TOTAL) BY MOUTH 3 (THREE) TIMES A WEEK. 36 tablet 3   buPROPion (WELLBUTRIN XL) 150 MG 24 hr tablet Take 300 mg by mouth daily.      Calcium Carbonate-Vitamin D (CALCIUM + D PO) Take 1 tablet by mouth 2 (two) times daily.      carvedilol (COREG) 12.5 MG tablet Take 1 tablet (12.5 mg total) by mouth 2 (two) times daily. 90 tablet 1   ELIQUIS 5 MG TABS tablet TAKE 1 TABLET BY MOUTH TWICE A DAY 180 tablet 1   ferrous gluconate (FERGON) 324 MG tablet       furosemide (LASIX) 40 MG tablet Take 1 tablet (40 mg total) by mouth daily. 90 tablet 3   levothyroxine (SYNTHROID, LEVOTHROID) 88 MCG tablet Take 88 mcg by mouth daily before breakfast.     Magnesium 500 MG TABS Take 500 mg by mouth daily.      Multiple Vitamins-Minerals (PRESERVISION AREDS 2) CAPS Take 1 capsule by mouth 2 (two) times daily.     omeprazole (PRILOSEC) 40 MG capsule Take 40 mg by mouth daily.     ONETOUCH ULTRA test strip      Polyethyl Glycol-Propyl Glycol 0.4-0.3 % SOLN Place 2 drops into both eyes 4 (four) times daily.     pyridoxine (B-6) 100 MG tablet Take 100 mg by mouth daily with supper.      rOPINIRole (REQUIP) 0.5 MG tablet Take 0.5 mg by mouth 4 (four) times daily.      spironolactone (ALDACTONE) 25 MG tablet Take 0.5 tablets (12.5 mg total) by mouth daily. 45 tablet 3   No current facility-administered medications for this visit.    Physical Exam: Vitals:   07/16/21 1229  BP: 108/64  Pulse: 71  SpO2: 99%  Weight: 169 lb 6.4 oz (76.8 kg)  Height: 5\' 1"  (1.549 m)    GEN- The patient is well appearing, alert and oriented x 3 today.   Head- normocephalic, atraumatic Eyes-  Sclera clear, conjunctiva pink Ears- hearing intact Oropharynx- clear Lungs- Clear to ausculation bilaterally, normal work of breathing Heart- Regular rate and rhythm, no murmurs, rubs or gallops, PMI not laterally displaced GI- soft, NT, ND, + BS Extremities- no clubbing, cyanosis, or edema  Wt Readings from Last 3 Encounters:  07/16/21 169 lb 6.4 oz (76.8 kg)  02/18/21 193 lb (87.5 kg)  02/15/21 197 lb 9.6 oz (89.6 kg)     Assessment and Plan:  Persistent atrial fibrillation 0% afib burden by ILR.  She has done very well post ablation Chads2vasc score is 5.  She is on eliquis Reduce coreg to 6.25mg  BID.    2. HTN Stable No change required today  3. OSA Compliance with CPAP advised  4. Tachycardia mediated CM Resolved  5. Iron deficiency anemia Resolved Labs  03/20/21 reviewed  6. HL Continue lipitor 20mg  daily  7. Depression Reports some sadness.   Poor appetite and has lost weight. Reduce coreg as above I have offered referral to psychologist for discussion.  She declines at this time.    Return to see EP APP annually  Thompson Grayer MD, [facility] 07/16/2021 12:40 PM

## 2021-07-23 ENCOUNTER — Ambulatory Visit (INDEPENDENT_AMBULATORY_CARE_PROVIDER_SITE_OTHER): Payer: Medicare PPO

## 2021-07-23 DIAGNOSIS — I428 Other cardiomyopathies: Secondary | ICD-10-CM | POA: Diagnosis not present

## 2021-07-24 ENCOUNTER — Other Ambulatory Visit: Payer: Self-pay | Admitting: Internal Medicine

## 2021-07-24 LAB — CUP PACEART REMOTE DEVICE CHECK
Date Time Interrogation Session: 20221013005440
Implantable Pulse Generator Implant Date: 20210322

## 2021-07-24 NOTE — Telephone Encounter (Signed)
Prescription refill request for Eliquis received. Indication: afib  Last office visit: 07/16/2021, [place] Scr: 1.10, 02/27/2021 Age: 80 Weight: 76.8 kg   Refill sent.

## 2021-08-01 NOTE — Progress Notes (Signed)
[facility] Summary Report / Loop Recorder 

## 2021-08-16 ENCOUNTER — Ambulatory Visit (HOSPITAL_COMMUNITY): Payer: Medicare PPO | Attending: Internal Medicine

## 2021-08-16 ENCOUNTER — Other Ambulatory Visit: Payer: Self-pay

## 2021-08-16 DIAGNOSIS — I350 Nonrheumatic aortic (valve) stenosis: Secondary | ICD-10-CM | POA: Insufficient documentation

## 2021-08-16 LAB — ECHOCARDIOGRAM COMPLETE
AR max vel: 0.63 cm2
AV Area VTI: 0.52 cm2
AV Area mean vel: 0.62 cm2
AV Mean grad: 32 mmHg
AV Peak grad: 57.8 mmHg
Ao pk vel: 3.8 m/s
Area-P 1/2: 4.99 cm2
P 1/2 time: 449 msec
S' Lateral: 3.1 cm

## 2021-08-17 NOTE — Progress Notes (Signed)
Cardiology Office Note:    Date:  08/23/2021   ID:  Jenna [name], Jenna [name] 1941/04/07, MRN [id number]  PCP:  Chesley Noon, MD  Cardiologist:  [name], MD   Referring MD: Chesley Noon, MD   Chief Complaint  Patient presents with   Follow-up    Aortic stenosis     History of Present Illness:    Jenna [name] is a 80 y.o. female with a hx of chronic systolic heart failure, nonischemic cardiomyopathy felt to be tachycardia mediated, persistent atrial fibrillation on chronic anticoagulation with Eliquis, OSA on CPAP, CKD stage III, iron deficiency anemia, hyperlipidemia, and depression.  She has mild aortic stenosis and history of negative nuclear stress test in August 2013.  30 day monitor for palpitations showed Afib. TEE showed EF of 20-25% with no LV thrombus. She underwent atrial fibrillation ablation in April 2018 and maintained normal sinus rhythm.  Follow up echo showed improved EF with restoration of Afib. However due to recurrence of palpitations an ILR. ILR has not shown Afib but she has been reluctant to stop eliquis. Since then, Dr. Rayann Heman has recommended lifelong anticoagulation.   She was last seen by Dr. Debara Pickett 02/15/21 and was doing well at that time. She was also seen by Dr. Rayann Heman 07/16/21 and coreg was reduced to 6.25 mg BID for depression. She reported increased sadness and poor appetite.   Aortic stenosis has been followed with echos. Most recent echo completed 08/16/21. Unfortunately, AS has advanced to moderately severe to severe. She presents today for follow up and for evaluation of symptoms.   She would like to relay to Dr. Rayann Heman she feels much better on the reduced dose of coreg. No further depression. She had a stressful summer with a leaking bathroom. Sunday night she had some chest heaviness and felt her arms were heavy. She was able to sleep in the recliner. Pacercheck showed no Afib. She has had an episode of chest discomfort once weekly for the  last two weeks. She denies SOB, but has had intermittent LE swelling.   She is tearful recounting the passing of her sister this past spring.    Past Medical History:  Diagnosis Date   Anxiety    CHF (congestive heart failure) (HCC)    Chronic lower back pain    Chronic systolic dysfunction of left ventricle    Depression    Dysrhythmia    A-Fib   Fatigue    GERD (gastroesophageal reflux disease)    High cholesterol    Hypertension    Hypothyroidism    Melanoma of shoulder, left (HCC)    Mild aortic stenosis    Nonischemic cardiomyopathy (HCC)    OSA on CPAP    reports compliance with CPAP   Osteoarthritis    Persistent atrial fibrillation with RVR ([place])    [name] 11/15/2016   PONV (postoperative nausea and vomiting)    RLS (restless legs syndrome)    S/P ablation of atrial flutter/ fib 01/10/17 01/11/2017   Type II diabetes mellitus ([place])    no medications    Past Surgical History:  Procedure Laterality Date   ATRIAL FIBRILLATION ABLATION N/A 01/10/2017   Procedure: Atrial Fibrillation Ablation;  Surgeon: Thompson Grayer, MD;  Location: [facility];  Service: Cardiovascular;  Laterality: N/A;   CARDIOVERSION N/A 11/19/2016   Procedure: CARDIOVERSION;  Surgeon: Lelon Perla, MD;  Location: [place];  Service: Cardiovascular;  Laterality: N/A;   CARDIOVERSION N/A 12/11/2016  Procedure: CARDIOVERSION;  Surgeon: Dorothy Spark, MD;  Location: [place];  Service: Cardiovascular;  Laterality: N/A;   CARDIOVERSION N/A 12/18/2016   Procedure: CARDIOVERSION;  Surgeon: Skeet Latch, MD;  Location: [place];  Service: Cardiovascular;  Laterality: N/A;   CATARACT EXTRACTION W/ INTRAOCULAR LENS  IMPLANT, BILATERAL Bilateral    DILATION AND CURETTAGE OF UTERUS     implantable loop recorder placement  12/27/2019   Medtronic Reveal Linq model UYE 33 (SN [id number]) implanted in office by Dr Clide Deutscher [facility]  05/13/2012   No ECG changes. EKG negative for  ischemia. No significant ischemia demonstrated.   MELANOMA EXCISION Left ~ 09/1979   "back of my shoulder"   TEE WITHOUT CARDIOVERSION N/A 11/19/2016   Procedure: TRANSESOPHAGEAL ECHOCARDIOGRAM (TEE);  Surgeon: Lelon Perla, MD;  Location[facility];  Service: Cardiovascular;  Laterality: N/A;  need CV, but no anesthesia available   TEE WITHOUT CARDIOVERSION N/A 01/10/2017   Procedure: TRANSESOPHAGEAL ECHOCARDIOGRAM (TEE);  Surgeon: [name], MD;  Location[facility];  Service: Cardiovascular;  Laterality: N/A;   TOTAL KNEE ARTHROPLASTY Right 07/28/2018   Procedure: RIGHT TOTAL KNEE ARTHROPLASTY;  Surgeon: Mcarthur Rossetti, MD;  Location: [place];  Service: Orthopedics;  Laterality: Right;   TRANSTHORACIC ECHOCARDIOGRAM  02/18/2012   EF >55%, mild aortic stenosis   VAGINAL HYSTERECTOMY  1985    Current Medications: Current Meds  Medication Sig   acetaminophen (TYLENOL) 500 MG tablet Take 500-1,000 mg by mouth daily.   ALPRAZolam (XANAX) 0.5 MG tablet Take 0.5 mg by mouth 2 (two) times daily.    atorvastatin (LIPITOR) 20 MG tablet TAKE 1 TABLET (20 MG TOTAL) BY MOUTH 3 (THREE) TIMES A WEEK.   buPROPion (WELLBUTRIN XL) 150 MG 24 hr tablet Take 300 mg by mouth daily.    Calcium Carbonate-Vitamin D (CALCIUM + D PO) Take 1 tablet by mouth 2 (two) times daily.    carvedilol (COREG) 6.25 MG tablet Take 1 tablet (6.25 mg total) by mouth 2 (two) times daily with a meal.   ELIQUIS 5 MG TABS tablet TAKE 1 TABLET BY MOUTH TWICE A DAY   furosemide (LASIX) 40 MG tablet Take 1 tablet (40 mg total) by mouth daily.   levothyroxine (SYNTHROID, LEVOTHROID) 88 MCG tablet Take 88 mcg by mouth daily before breakfast.   Magnesium 500 MG TABS Take 500 mg by mouth daily.    Multiple Vitamins-Minerals (PRESERVISION AREDS 2) CAPS Take 1 capsule by mouth 2 (two) times daily.   omeprazole (PRILOSEC) 40 MG capsule Take 40 mg by mouth daily.   ONETOUCH ULTRA test strip    Polyethyl Glycol-Propyl  Glycol 0.4-0.3 % SOLN Place 2 drops into both eyes 4 (four) times daily.   pyridoxine (B-6) 100 MG tablet Take 100 mg by mouth daily with supper.    rOPINIRole (REQUIP) 0.5 MG tablet Take 0.5 mg by mouth 4 (four) times daily.    spironolactone (ALDACTONE) 25 MG tablet Take 0.5 tablets (12.5 mg total) by mouth daily.   zinc gluconate 50 MG tablet Take 50 mg by mouth daily.     Allergies:   Crestor [rosuvastatin], Amoxicillin, Crestor [rosuvastatin], and Lovastatin   Social History   Socioeconomic History   Marital status: Widowed    Spouse name: Not on file   Number of children: Not on file   Years of education: Not on file   Highest education level: Not on file  Occupational History   Not on file  Tobacco  Use   Smoking status: Never   Smokeless tobacco: Never  Vaping Use   Vaping Use: Never used  Substance and Sexual Activity   Alcohol use: No   Drug use: Never   Sexual activity: Never  Other Topics Concern   Not on file  Social History Narrative   Not on file   Social Determinants of Health   Financial Resource Strain: Not on file  Food Insecurity: Not on file  Transportation Needs: Not on file  Physical Activity: Not on file  Stress: Not on file  Social Connections: Not on file     Family History: The patient's family history includes Arrhythmia in her sister; Cancer in her paternal grandfather; Diabetes in her mother; Healthy in her brother and daughter; Heart failure in her father; Hyperlipidemia in her sister and son; Hypertension in her mother, sister, and son; Stroke in her paternal grandmother.  ROS:   Please see the history of present illness.     All other systems reviewed and are negative.  EKGs/Labs/Other Studies Reviewed:    The following studies were reviewed today:  Echo 08/16/21: 1. Left ventricular ejection fraction, by estimation, is 60 to 65%. Left  ventricular ejection fraction by 3D volume is 60 %. The left ventricle has  normal function.  The left ventricle has no regional wall motion  abnormalities. There is mild left  ventricular hypertrophy of the basal-septal segment. Left ventricular  diastolic parameters are consistent with Grade I diastolic dysfunction  (impaired relaxation). Elevated left ventricular end-diastolic pressure.   2. Right ventricular systolic function is normal. The right ventricular  size is normal.   3. The mitral valve is normal in structure. Trivial mitral valve  regurgitation. No evidence of mitral stenosis.   4. The aortic valve is calcified. There is severe calcifcation of the  aortic valve and the right coronary cusp appears fixed. There is severe  thickening of the aortic valve. Aortic valve regurgitation is mild to  moderate. Moderate to severe aortic valve   stenosis. Aortic regurgitation PHT measures 449 msec. Aortic valve area,  by VTI measures 0.52 cm. Aortic valve mean gradient measures 32.0 mmHg.  Aortic valve Vmax measures 3.80 m/s.   5. The inferior vena cava is normal in size with greater than 50%  respiratory variability, suggesting right atrial pressure of 3 mmHg.   6. Compared to study dated 02/12/2021, the mean AVG has decreased from 34  to 43mmHg, Vmax has increased from 3.63 to 3.40m/s and DI has decreased  from 0.20 to 0.19.   EKG:  EKG is  ordered today.  The ekg ordered today demonstrates sinus rhythm HR 99  Recent Labs: 02/27/2021: BNP 102.2; BUN 24; Creatinine, Ser 1.10; Potassium 4.4; Sodium 138  Recent Lipid Panel    Component Value Date/Time   CHOL 170 02/11/2017 0941   TRIG 93 02/11/2017 0941   HDL 82 02/11/2017 0941   CHOLHDL 2.1 02/11/2017 0941   VLDL 19 02/11/2017 0941   LDLCALC 69 02/11/2017 0941    Physical Exam:    VS:  BP 132/80   Pulse (!) 110   Ht 5\' 1"  (1.549 m)   Wt 193 lb 3.2 oz (87.6 kg)   SpO2 99%   BMI 36.50 kg/m     Wt Readings from Last 3 Encounters:  08/23/21 193 lb 3.2 oz (87.6 kg)  07/16/21 169 lb 6.4 oz (76.8 kg)  02/18/21 193 lb  (87.5 kg)     GEN:  Well nourished, well  developed in no acute distress HEENT: Normal NECK: No JVD; No carotid bruits LYMPHATICS: No lymphadenopathy CARDIAC: regular rhythm, tachycardic rate, 3/6 systolic murmur, do  not appreciate S2 RESPIRATORY:  Clear to auscultation without rales, wheezing or rhonchi  ABDOMEN: Soft, non-tender, non-distended MUSCULOSKELETAL:  No edema; No deformity  SKIN: Warm and dry NEUROLOGIC:  Alert and oriented x 3 PSYCHIATRIC:  Normal affect   ASSESSMENT:    1. Nonrheumatic aortic valve stenosis   2. Persistent atrial fibrillation ([place])   3. S/P ablation of atrial flutter/ fib 01/10/17   4. Chronic anticoagulation   5. Obstructive sleep apnea   6. Essential hypertension   7. Nonischemic cardiomyopathy ([place])    PLAN:    In order of problems listed above:  Moderate to severe aortic stenosis - aortic valve area by VTI 0.52 cm2, mean gradient 32 mmHg - supsect new/progressive symptoms, will refer to structural heart team - she prefers to see Dr. Angelena Form   Atrial fibrillation Chronic anticoagulation - s/p ablation in Aprl 2018 - doing well on eliquis, no bleeding - she is tachycardic on vitals, EKG shows sinus rhythm HR 99 - she states she has felt no change in her heart rate or palpations with reduction in coreg - would prefer a lower heart rate since her CM felt tachy-mediated, but she did not tolerate higher doses of coreg - she is asymptomatic, will hold off on changes for now, but will send a message to Dr. Rayann Heman   OSA on CPAP - compliant   Hypertension - controlled, no change in therapy   Nonischemic cardiomyopathy - felt to be tachy-mediated - EF improved form 20% to 60% with restoration of sinus rhythm - no signs of heart failure - LVEF 60-65% on recent echo   Will refer to structural heart team.    Medication Adjustments/Labs and Tests Ordered: Current medicines are reviewed at length with the patient today.  Concerns  regarding medicines are outlined above.  No orders of the defined types were placed in this encounter.  No orders of the defined types were placed in this encounter.   Signed, Ledora Bottcher, PA  08/23/2021 2:18 PM    [facility]

## 2021-08-22 LAB — CUP PACEART REMOTE DEVICE CHECK
Date Time Interrogation Session: 20221114235516
Implantable Pulse Generator Implant Date: 20210322

## 2021-08-23 ENCOUNTER — Ambulatory Visit: Payer: Medicare PPO | Admitting: Physician Assistant

## 2021-08-23 ENCOUNTER — Encounter: Payer: Self-pay | Admitting: Physician Assistant

## 2021-08-23 ENCOUNTER — Other Ambulatory Visit: Payer: Self-pay

## 2021-08-23 VITALS — BP 132/80 | HR 110 | Ht 61.0 in | Wt 193.2 lb

## 2021-08-23 DIAGNOSIS — Z8679 Personal history of other diseases of the circulatory system: Secondary | ICD-10-CM

## 2021-08-23 DIAGNOSIS — I4819 Other persistent atrial fibrillation: Secondary | ICD-10-CM

## 2021-08-23 DIAGNOSIS — I10 Essential (primary) hypertension: Secondary | ICD-10-CM

## 2021-08-23 DIAGNOSIS — Z7901 Long term (current) use of anticoagulants: Secondary | ICD-10-CM

## 2021-08-23 DIAGNOSIS — G4733 Obstructive sleep apnea (adult) (pediatric): Secondary | ICD-10-CM

## 2021-08-23 DIAGNOSIS — I428 Other cardiomyopathies: Secondary | ICD-10-CM

## 2021-08-23 DIAGNOSIS — I350 Nonrheumatic aortic (valve) stenosis: Secondary | ICD-10-CM | POA: Diagnosis not present

## 2021-08-23 DIAGNOSIS — Z9889 Other specified postprocedural states: Secondary | ICD-10-CM

## 2021-08-23 NOTE — Patient Instructions (Signed)
Medication Instructions:  No changes *If you need a refill on your cardiac medications before your next appointment, please call your pharmacy*   Lab Work: No Labs If you have labs (blood work) drawn today and your tests are completely normal, you will receive your results only by: [place] (if you have MyChart) OR A paper copy in the mail If you have any lab test that is abnormal or we need to change your treatment, we will call you to review the results.   Testing/Procedures: No Testing   Follow-Up: At [facility], you and your health needs are our priority.  As part of our continuing mission to provide you with exceptional heart care, we have created designated Provider Care Teams.  These Care Teams include your primary Cardiologist (physician) and Advanced Practice Providers (APPs -  Physician Assistants and Nurse Practitioners) who all work together to provide you with the care you need, when you need it.  We recommend signing up for the patient portal called "MyChart".  Sign up information is provided on this After Visit Summary.  MyChart is used to connect with patients for Virtual Visits (Telemedicine).  Patients are able to view lab/test results, encounter notes, upcoming appointments, etc.  Non-urgent messages can be sent to your provider as well.   To learn more about what you can do with MyChart, go to [email or web address].    Your next appointment:   6 month(s)  The format for your next appointment:   In Person  Provider:   [name], MD

## 2021-08-27 ENCOUNTER — Ambulatory Visit (INDEPENDENT_AMBULATORY_CARE_PROVIDER_SITE_OTHER): Payer: Medicare PPO

## 2021-08-27 DIAGNOSIS — I428 Other cardiomyopathies: Secondary | ICD-10-CM | POA: Diagnosis not present

## 2021-09-04 NOTE — H&P (View-Only) (Signed)
[facility] Consult Note  Chief Complaint  Patient presents with   New Patient (Initial Visit)    Aortic stenosis   History of Present Illness: 80 yo female with history of chronic systolic heart failure, non-ischemic cardiomyopathy, persistent atrial fibrillation on Eliquis s/p atrial fibrillation ablation in 2018, sleep apnea on CPAP, CKD stage III, iron deficiency anemia, hyperlipidemia, depression and severe aortic stenosis who is here today as a new consult, referred by Dr. Debara Pickett, for further discussion regarding her aortic stenosis and possible TAVR. She is on lifelong Eliquis for atrial fibrillation and has been followed by Dr. Rayann Heman. 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 She has been followed for moderate aortic stenosis. Echo 08/16/21 with LVEF=60-65%. Severe aortic stenosis with thickened and calcified leaflets, mean gradient 32 mmHg, peak gradient 57.8 mmHg, AVA 0.52 cm2, dimensionless index 0.17.   She tells me today that she has been having progressive dyspnea, fatigue and chest pressure. No dizziness. Chronic mild LE edema on Lasix. She lives [place] on [address]. She is a widow and is here today with her daughter. She goes to the dentist regularly. She has no active dental issues. 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   Primary Care Physician: Chesley Noon, MD Primary Cardiologist: [facility] Referring Cardiologist: Debara Pickett  Past Medical History:  Diagnosis Date   Anxiety    CHF (congestive heart failure) ([place])    Chronic lower back pain    Chronic systolic dysfunction of left ventricle    Depression    Dysrhythmia    A-Fib   Fatigue    GERD (gastroesophageal reflux disease)    High cholesterol    Hypertension    Hypothyroidism    Melanoma of shoulder, left (HCC)    Mild aortic stenosis    Nonischemic cardiomyopathy (HCC)    OSA on CPAP    reports compliance with CPAP   Osteoarthritis     Persistent atrial fibrillation with RVR ([place])    [name] 11/15/2016   PONV (postoperative nausea and vomiting)    RLS (restless legs syndrome)    S/P ablation of atrial flutter/ fib 01/10/17 01/11/2017   Type II diabetes mellitus ([place])    no medications    Past Surgical History:  Procedure Laterality Date   ATRIAL FIBRILLATION ABLATION N/A 01/10/2017   Procedure: Atrial Fibrillation Ablation;  Surgeon: Thompson Grayer, MD;  Location: [place] [facility];  Service: Cardiovascular;  Laterality: N/A;   CARDIOVERSION N/A 11/19/2016   Procedure: CARDIOVERSION;  Surgeon: Lelon Perla, MD;  Location: [facility]OS[facility];  Service: Cardiovascular;  Laterality: N/A;   CARDIOVERSION N/A 12/11/2016   Procedure: CARDIOVERSION;  Surgeon: Dorothy Spark, MD;  Location: [place];  Service: Cardiovascular;  Laterality: N/A;   CARDIOVERSION N/A 12/18/2016   Procedure: CARDIOVERSION;  Surgeon: Skeet Latch, MD;  Location: [place];  Service: Cardiovascular;  Laterality: N/A;   CATARACT EXTRACTION W/ INTRAOCULAR LENS  IMPLANT, BILATERAL Bilateral    DILATION AND CURETTAGE OF UTERUS     implantable loop recorder placement  12/27/2019   Medtronic Reveal Linq model TDD 22 (SN [id number]) implanted in office by Dr Clide Deutscher [facility]  05/13/2012   No ECG changes. EKG negative for ischemia. No significant ischemia demonstrated.   MELANOMA EXCISION Left ~ 09/1979   "back of my shoulder"   TEE WITHOUT CARDIOVERSION N/A 11/19/2016   Procedure: TRANSESOPHAGEAL ECHOCARDIOGRAM (TEE);  Surgeon: Lelon Perla, MD;  Location: [facility] ENDOSCOPY;  Service: Cardiovascular;  Laterality: N/A;  need CV, but no anesthesia available   TEE WITHOUT CARDIOVERSION N/A 01/10/2017   Procedure: TRANSESOPHAGEAL ECHOCARDIOGRAM (TEE);  Surgeon: [name], MD;  Location: [facility] ENDOSCOPY;  Service: Cardiovascular;  Laterality: N/A;   TOTAL KNEE ARTHROPLASTY Right 07/28/2018   Procedure: RIGHT TOTAL KNEE ARTHROPLASTY;  Surgeon: Mcarthur Rossetti, MD;  Location: [place];  Service: Orthopedics;  Laterality: Right;   TRANSTHORACIC ECHOCARDIOGRAM  02/18/2012   EF >55%, mild aortic stenosis   VAGINAL HYSTERECTOMY  1985    Current Outpatient Medications  Medication Sig Dispense Refill   acetaminophen (TYLENOL) 500 MG tablet Take 500-1,000 mg by mouth daily.     ALPRAZolam (XANAX) 0.5 MG tablet Take 0.5 mg by mouth 2 (two) times daily.      atorvastatin (LIPITOR) 20 MG tablet TAKE 1 TABLET (20 MG TOTAL) BY MOUTH 3 (THREE) TIMES A WEEK. 36 tablet 3   buPROPion (WELLBUTRIN XL) 150 MG 24 hr tablet Take 300 mg by mouth daily.      Calcium Carbonate-Vitamin D (CALCIUM + D PO) Take 1 tablet by mouth 2 (two) times daily.      carvedilol (COREG) 6.25 MG tablet Take 1 tablet (6.25 mg total) by mouth 2 (two) times daily with a meal. 180 tablet 3   ELIQUIS 5 MG TABS tablet TAKE 1 TABLET BY MOUTH TWICE A DAY 180 tablet 1   furosemide (LASIX) 40 MG tablet Take 1 tablet (40 mg total) by mouth daily. 90 tablet 3   levothyroxine (SYNTHROID, LEVOTHROID) 88 MCG tablet Take 88 mcg by mouth daily before breakfast.     Magnesium 500 MG TABS Take 500 mg by mouth daily.      Multiple Vitamins-Minerals (PRESERVISION AREDS 2) CAPS Take 1 capsule by mouth 2 (two) times daily.     omeprazole (PRILOSEC) 40 MG capsule Take 40 mg by mouth daily.     ONETOUCH ULTRA test strip      Polyethyl Glycol-Propyl Glycol 0.4-0.3 % SOLN Place 2 drops into both eyes 4 (four) times daily.     pyridoxine (B-6) 100 MG tablet Take 100 mg by mouth daily with supper.      rOPINIRole (REQUIP) 0.5 MG tablet Take 0.5 mg by mouth 4 (four) times daily.      spironolactone (ALDACTONE) 25 MG tablet Take 0.5 tablets (12.5 mg total) by mouth daily. 45 tablet 3   zinc gluconate 50 MG tablet Take 50 mg by mouth daily.     ferrous gluconate (FERGON) 324 MG tablet  (Patient not taking: Reported on 09/05/2021)     No current facility-administered medications for this visit.     Allergies  Allergen Reactions   Crestor [Rosuvastatin]    Amoxicillin Nausea Only   Crestor [Rosuvastatin] Other (See Comments)    Joints ached   Lovastatin Other (See Comments)    Joints hurt all over body     Social History   Socioeconomic History   Marital status: Widowed    Spouse name: Not on file   Number of children: 2   Years of education: Not on file   Highest education level: Not on file  Occupational History   Occupation: [employer]  Tobacco Use   Smoking status: Never   Smokeless tobacco: Never  Vaping Use   Vaping Use: Never used  Substance and Sexual Activity   Alcohol use: No   Drug use: Never   Sexual activity: Never  Other  Topics Concern   Not on file  Social History Narrative   Not on file   Social Determinants of Health   Financial Resource Strain: Not on file  Food Insecurity: Not on file  Transportation Needs: Not on file  Physical Activity: Not on file  Stress: Not on file  Social Connections: Not on file  Intimate Partner Violence: Not on file    Family History  Problem Relation Age of Onset   Hypertension Mother    Diabetes Mother    Heart failure Father    Arrhythmia Sister    Hyperlipidemia Sister    Hypertension Sister    Healthy Brother    Stroke Paternal Grandmother    Cancer Paternal Grandfather    Healthy Daughter    Hypertension Son    Hyperlipidemia Son     Review of Systems:  As stated in the HPI and otherwise negative.   BP 126/70   Pulse (!) 104   Ht 5\' 1"  (1.549 m)   Wt 192 lb (87.1 kg)   SpO2 99%   BMI 36.28 kg/m   Physical Examination: General: Well developed, well nourished, NAD  HEENT: OP clear, mucus membranes moist  SKIN: warm, dry. No rashes. Neuro: No focal deficits  Musculoskeletal: Muscle strength 5/5 all ext  Psychiatric: Mood and affect normal  Neck: No JVD, no carotid bruits, no thyromegaly, no lymphadenopathy.  Lungs:Clear bilaterally, no wheezes, rhonci,  crackles Cardiovascular: Regular rate and rhythm. Loud, harsh, late peaking systolic murmur.  Abdomen:Soft. Bowel sounds present. Non-tender.  Extremities: Trace bilateral lower extremity edema.   EKG: EKG from 08/23/21 with sinus, 1st degree AV block   Echo 08/16/21:  1. Left ventricular ejection fraction, by estimation, is 60 to 65%. Left  ventricular ejection fraction by 3D volume is 60 %. The left ventricle has  normal function. The left ventricle has no regional wall motion  abnormalities. There is mild left  ventricular hypertrophy of the basal-septal segment. Left ventricular  diastolic parameters are consistent with Grade I diastolic dysfunction  (impaired relaxation). Elevated left ventricular end-diastolic pressure.   2. Right ventricular systolic function is normal. The right ventricular  size is normal.   3. The mitral valve is normal in structure. Trivial mitral valve  regurgitation. No evidence of mitral stenosis.   4. The aortic valve is calcified. There is severe calcifcation of the  aortic valve and the right coronary cusp appears fixed. There is severe  thickening of the aortic valve. Aortic valve regurgitation is mild to  moderate. Moderate to severe aortic valve   stenosis. Aortic regurgitation PHT measures 449 msec. Aortic valve area,  by VTI measures 0.52 cm. Aortic valve mean gradient measures 32.0 mmHg.  Aortic valve Vmax measures 3.80 m/s.   5. The inferior vena cava is normal in size with greater than 50%  respiratory variability, suggesting right atrial pressure of 3 mmHg.   6. Compared to study dated 02/12/2021, the mean AVG has decreased from 34  to 56mmHg, Vmax has increased from 3.63 to 3.43m/s and DI has decreased  from 0.20 to 0.19.   FINDINGS   Left Ventricle: Left ventricular ejection fraction, by estimation, is 60  to 65%. Left ventricular ejection fraction by 3D volume is 60 %. The left  ventricle has normal function. The left ventricle has no  regional wall  motion abnormalities. The left  ventricular internal cavity size was normal in size. There is mild left  ventricular hypertrophy of the basal-septal segment. Left ventricular  diastolic parameters are consistent with Grade I diastolic dysfunction  (impaired relaxation). Elevated left  ventricular end-diastolic pressure.   Right Ventricle: The right ventricular size is normal. No increase in  right ventricular wall thickness. Right ventricular systolic function is  normal.   Left Atrium: Left atrial size was normal in size.   Right Atrium: Right atrial size was normal in size.   Pericardium: There is no evidence of pericardial effusion.   Mitral Valve: The mitral valve is normal in structure. Trivial mitral  valve regurgitation. No evidence of mitral valve stenosis.   Tricuspid Valve: The tricuspid valve is normal in structure. Tricuspid  valve regurgitation is not demonstrated. No evidence of tricuspid  stenosis.   Aortic Valve: The aortic valve is calcified. There is severe calcifcation  of the aortic valve. There is severe thickening of the aortic valve.  Aortic valve regurgitation is mild to moderate. Aortic regurgitation PHT  measures 449 msec. Moderate to severe  aortic stenosis is present. Aortic valve mean gradient measures 32.0 mmHg.  Aortic valve peak gradient measures 57.8 mmHg. Aortic valve area, by VTI  measures 0.52 cm.   Pulmonic Valve: The pulmonic valve was normal in structure. Pulmonic valve  regurgitation is trivial. No evidence of pulmonic stenosis.   Aorta: The aortic root is normal in size and structure.   Venous: The inferior vena cava is normal in size with greater than 50%  respiratory variability, suggesting right atrial pressure of 3 mmHg.   IAS/Shunts: No atrial level shunt detected by color flow Doppler.      LEFT VENTRICLE  PLAX 2D  LVIDd:         4.80 cm         Diastology  LVIDs:         3.10 cm         LV e' medial:     5.22 cm/s  LV PW:         0.70 cm         LV E/e' medial:  13.7  LV IVS:        1.20 cm         LV e' lateral:   6.09 cm/s  LVOT diam:     2.00 cm         LV E/e' lateral: 11.7  LV SV:         45  LV SV Index:   26  LVOT Area:     3.14 cm        3D Volume EF                                 LV 3D EF:    Left                                              ventricul                                              ar  ejection                                              fraction                                              by 3D                                              volume is                                              60 %.                                    3D Volume EF:                                 3D EF:        60 %                                 LV EDV:       109 ml                                 LV ESV:       44 ml                                 LV SV:        66 ml   RIGHT VENTRICLE  RV S prime:     12.10 cm/s  TAPSE (M-mode): 1.9 cm   LEFT ATRIUM             Index        RIGHT ATRIUM           Index  LA diam:        3.50 cm 1.99 cm/m   RA Area:     15.90 cm  LA Vol (A2C):   41.0 ml 23.29 ml/m  RA Volume:   42.30 ml  24.03 ml/m  LA Vol (A4C):   53.1 ml 30.17 ml/m  LA Biplane Vol: 46.5 ml 26.42 ml/m   AORTIC VALVE  AV Area (Vmax):    0.63 cm  AV Area (Vmean):   0.62 cm  AV Area (VTI):     0.52 cm  AV Vmax:           380.00 cm/s  AV Vmean:          266.000 cm/s  AV VTI:            0.874 m  AV Peak Grad:      57.8  mmHg  AV Mean Grad:      32.0 mmHg  LVOT Vmax:         76.35 cm/s  LVOT Vmean:        52.450 cm/s  LVOT VTI:          0.145 m  LVOT/AV VTI ratio: 0.17  AI PHT:            449 msec     AORTA  Ao Root diam: 3.60 cm  Ao Asc diam:  3.50 cm   MITRAL VALVE  MV Area (PHT): cm         SHUNTS  MV Decel Time: 152 msec    Systemic VTI:  0.14 m  MV E velocity: 71.50 cm/s  Systemic Diam: 2.00 cm  MV A  velocity: 88.45 cm/s  MV E/A ratio:  0.81   Recent Labs: 02/27/2021: BNP 102.2 09/05/2021: BUN 32; Creatinine, Ser 1.12; Hemoglobin 13.8; Platelets 364; Potassium 4.8; Sodium 137    Wt Readings from Last 3 Encounters:  09/05/21 192 lb (87.1 kg)  08/23/21 193 lb 3.2 oz (87.6 kg)  07/16/21 169 lb 6.4 oz (76.8 kg)     Other studies Reviewed: Additional studies/ records that were reviewed today include: echo images, EKG, office notes Review of the above records demonstrates: severe AS  STS Risk Score: Risk of Mortality:  3.047%  Renal Failure:  3.919%  Permanent Stroke:  1.721%  Prolonged Ventilation:  8.707%  DSW Infection:  0.189%  Reoperation:  2.823%  Morbidity or Mortality:  12.507%  Short Length of Stay:  21.426%  Long Length of Stay:  8.286%   Assessment and Plan:   1. Severe Aortic Valve Stenosis: She has severe, stage D aortic valve stenosis. I have personally reviewed the echo images. The aortic valve is thickened, calcified with limited leaflet mobility. I think she would benefit from AVR. Given advanced age, she is not a good candidate for conventional AVR by surgical approach. I think she may be a good candidate for TAVR.   I have reviewed the natural history of aortic stenosis with the patient and their family members  who are present today. We have discussed the limitations of medical therapy and the poor prognosis associated with symptomatic aortic stenosis. We have reviewed potential treatment options, including palliative medical therapy, conventional surgical aortic valve replacement, and transcatheter aortic valve replacement. We discussed treatment options in the context of the patient's specific comorbid medical conditions.   She would like to proceed with planning for TAVR. I will arrange a right and left heart catheterization at [facility] 09/17/21 at 9am. Risks and benefits of the cath procedure and the valve procedure are reviewed with the patient. After the  cath, she will have a cardiac CT, CTA of the chest/abdomen and pelvis and will then be referred to see one of the CT surgeons on our TAVR team.      Current medicines are reviewed at length with the patient today.  The patient does not have concerns regarding medicines.  The following changes have been made:  no change  Labs/ tests ordered today include:   Orders Placed This Encounter  Procedures   CBC   Basic metabolic panel      Disposition:   F/U with the valve team.    Signed, Lauree Chandler, MD 09/05/2021 2:02 PM    [address], [place], [place]  [address] Phone: [phone]; Fax: [phone]

## 2021-09-04 NOTE — Progress Notes (Signed)
[facility]ural [facility] Clinic Consult Note  Chief Complaint  Patient presents with   New Patient (Initial Visit)    Aortic stenosis   History of Present Illness: 80 yo female with history of chronic systolic heart failure, non-ischemic cardiomyopathy, persistent atrial fibrillation on Eliquis s/p atrial fibrillation ablation in 2018, sleep apnea on CPAP, CKD stage III, iron deficiency anemia, hyperlipidemia, depression and severe aortic stenosis who is here today as a new consult, referred by Dr. Debara Pickett, for further discussion regarding her aortic stenosis and possible TAVR. She is on lifelong Eliquis for atrial fibrillation and has been followed by Dr. Rayann Heman. 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 She has been followed for moderate aortic stenosis. Echo 08/16/21 with LVEF=60-65%. Severe aortic stenosis with thickened and calcified leaflets, mean gradient 32 mmHg, peak gradient 57.8 mmHg, AVA 0.52 cm2, dimensionless index 0.17.   She tells me today that she has been having progressive dyspnea, fatigue and chest pressure. No dizziness. Chronic mild LE edema on Lasix. She lives [place] on [address]. She is a widow and is here today with her daughter. She goes to the dentist regularly. She has no active dental issues. 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   Primary Care Physician: Chesley Noon, MD Primary Cardiologist: [facility] [place] Referring Cardiologist: Debara Pickett  Past Medical History:  Diagnosis Date   Anxiety    CHF (congestive heart failure) ([place])    Chronic lower back pain    Chronic systolic dysfunction of left ventricle    Depression    Dysrhythmia    A-Fib   Fatigue    GERD (gastroesophageal reflux disease)    High cholesterol    Hypertension    Hypothyroidism    Melanoma of shoulder, left (HCC)    Mild aortic stenosis    Nonischemic cardiomyopathy (HCC)    OSA on CPAP    reports compliance with CPAP   Osteoarthritis     Persistent atrial fibrillation with RVR ([place])    [name] 11/15/2016   PONV (postoperative nausea and vomiting)    RLS (restless legs syndrome)    S/P ablation of atrial flutter/ fib 01/10/17 01/11/2017   Type II diabetes mellitus ([place])    no medications    Past Surgical History:  Procedure Laterality Date   ATRIAL FIBRILLATION ABLATION N/A 01/10/2017   Procedure: Atrial Fibrillation Ablation;  Surgeon: Thompson Grayer, MD;  Location: [facility];  Service: Cardiovascular;  Laterality: N/A;   CARDIOVERSION N/A 11/19/2016   Procedure: CARDIOVERSION;  Surgeon: Lelon Perla, MD;  Location: [facility] ENDOSCOPY;  Service: Cardiovascular;  Laterality: N/A;   CARDIOVERSION N/A 12/11/2016   Procedure: CARDIOVERSION;  Surgeon: Dorothy Spark, MD;  Location: [place];  Service: Cardiovascular;  Laterality: N/A;   CARDIOVERSION N/A 12/18/2016   Procedure: CARDIOVERSION;  Surgeon: Skeet Latch, MD;  Location: [place];  Service: Cardiovascular;  Laterality: N/A;   CATARACT EXTRACTION W/ INTRAOCULAR LENS  IMPLANT, BILATERAL Bilateral    DILATION AND CURETTAGE OF UTERUS     implantable loop recorder placement  12/27/2019   Medtronic Reveal Linq model OJJ 00 (SN [id number]) implanted in office by Dr Clide Deutscher [facility]  05/13/2012   No ECG changes. EKG negative for ischemia. No significant ischemia demonstrated.   MELANOMA EXCISION Left ~ 09/1979   "back of my shoulder"   TEE WITHOUT CARDIOVERSION N/A 11/19/2016   Procedure: TRANSESOPHAGEAL ECHOCARDIOGRAM (TEE);  Surgeon: Lelon Perla, MD;  Location: [facility] ENDOSCOPY;  Service: Cardiovascular;  Laterality: N/A;  need CV, but no anesthesia available   TEE WITHOUT CARDIOVERSION N/A 01/10/2017   Procedure: TRANSESOPHAGEAL ECHOCARDIOGRAM (TEE);  Surgeon: [name], MD;  Location: [facility] ENDOSCOPY;  Service: Cardiovascular;  Laterality: N/A;   TOTAL KNEE ARTHROPLASTY Right 07/28/2018   Procedure: RIGHT TOTAL KNEE ARTHROPLASTY;  Surgeon: Mcarthur Rossetti, MD;  Location: [place];  Service: Orthopedics;  Laterality: Right;   TRANSTHORACIC ECHOCARDIOGRAM  02/18/2012   EF >55%, mild aortic stenosis   VAGINAL HYSTERECTOMY  1985    Current Outpatient Medications  Medication Sig Dispense Refill   acetaminophen (TYLENOL) 500 MG tablet Take 500-1,000 mg by mouth daily.     ALPRAZolam (XANAX) 0.5 MG tablet Take 0.5 mg by mouth 2 (two) times daily.      atorvastatin (LIPITOR) 20 MG tablet TAKE 1 TABLET (20 MG TOTAL) BY MOUTH 3 (THREE) TIMES A WEEK. 36 tablet 3   buPROPion (WELLBUTRIN XL) 150 MG 24 hr tablet Take 300 mg by mouth daily.      Calcium Carbonate-Vitamin D (CALCIUM + D PO) Take 1 tablet by mouth 2 (two) times daily.      carvedilol (COREG) 6.25 MG tablet Take 1 tablet (6.25 mg total) by mouth 2 (two) times daily with a meal. 180 tablet 3   ELIQUIS 5 MG TABS tablet TAKE 1 TABLET BY MOUTH TWICE A DAY 180 tablet 1   furosemide (LASIX) 40 MG tablet Take 1 tablet (40 mg total) by mouth daily. 90 tablet 3   levothyroxine (SYNTHROID, LEVOTHROID) 88 MCG tablet Take 88 mcg by mouth daily before breakfast.     Magnesium 500 MG TABS Take 500 mg by mouth daily.      Multiple Vitamins-Minerals (PRESERVISION AREDS 2) CAPS Take 1 capsule by mouth 2 (two) times daily.     omeprazole (PRILOSEC) 40 MG capsule Take 40 mg by mouth daily.     ONETOUCH ULTRA test strip      Polyethyl Glycol-Propyl Glycol 0.4-0.3 % SOLN Place 2 drops into both eyes 4 (four) times daily.     pyridoxine (B-6) 100 MG tablet Take 100 mg by mouth daily with supper.      rOPINIRole (REQUIP) 0.5 MG tablet Take 0.5 mg by mouth 4 (four) times daily.      spironolactone (ALDACTONE) 25 MG tablet Take 0.5 tablets (12.5 mg total) by mouth daily. 45 tablet 3   zinc gluconate 50 MG tablet Take 50 mg by mouth daily.     ferrous gluconate (FERGON) 324 MG tablet  (Patient not taking: Reported on 09/05/2021)     No current facility-administered medications for this visit.     Allergies  Allergen Reactions   Crestor [Rosuvastatin]    Amoxicillin Nausea Only   Crestor [Rosuvastatin] Other (See Comments)    Joints ached   Lovastatin Other (See Comments)    Joints hurt all over body     Social History   Socioeconomic History   Marital status: Widowed    Spouse name: Not on file   Number of children: 2   Years of education: Not on file   Highest education level: Not on file  Occupational History   Occupation: [employer]  Tobacco Use   Smoking status: Never   Smokeless tobacco: Never  Vaping Use   Vaping Use: Never used  Substance and Sexual Activity   Alcohol use: No   Drug use: Never   Sexual activity: Never  Other  Topics Concern   Not on file  Social History Narrative   Not on file   Social Determinants of Health   Financial Resource Strain: Not on file  Food Insecurity: Not on file  Transportation Needs: Not on file  Physical Activity: Not on file  Stress: Not on file  Social Connections: Not on file  Intimate Partner Violence: Not on file    Family History  Problem Relation Age of Onset   Hypertension Mother    Diabetes Mother    Heart failure Father    Arrhythmia Sister    Hyperlipidemia Sister    Hypertension Sister    Healthy Brother    Stroke Paternal Grandmother    Cancer Paternal Grandfather    Healthy Daughter    Hypertension Son    Hyperlipidemia Son     Review of Systems:  As stated in the HPI and otherwise negative.   BP 126/70   Pulse (!) 104   Ht 5\' 1"  (1.549 m)   Wt 192 lb (87.1 kg)   SpO2 99%   BMI 36.28 kg/m   Physical Examination: General: Well developed, well nourished, NAD  HEENT: OP clear, mucus membranes moist  SKIN: warm, dry. No rashes. Neuro: No focal deficits  Musculoskeletal: Muscle strength 5/5 all ext  Psychiatric: Mood and affect normal  Neck: No JVD, no carotid bruits, no thyromegaly, no lymphadenopathy.  Lungs:Clear bilaterally, no wheezes, rhonci,  crackles Cardiovascular: Regular rate and rhythm. Loud, harsh, late peaking systolic murmur.  Abdomen:Soft. Bowel sounds present. Non-tender.  Extremities: Trace bilateral lower extremity edema.   EKG: EKG from 08/23/21 with sinus, 1st degree AV block   Echo 08/16/21:  1. Left ventricular ejection fraction, by estimation, is 60 to 65%. Left  ventricular ejection fraction by 3D volume is 60 %. The left ventricle has  normal function. The left ventricle has no regional wall motion  abnormalities. There is mild left  ventricular hypertrophy of the basal-septal segment. Left ventricular  diastolic parameters are consistent with Grade I diastolic dysfunction  (impaired relaxation). Elevated left ventricular end-diastolic pressure.   2. Right ventricular systolic function is normal. The right ventricular  size is normal.   3. The mitral valve is normal in structure. Trivial mitral valve  regurgitation. No evidence of mitral stenosis.   4. The aortic valve is calcified. There is severe calcifcation of the  aortic valve and the right coronary cusp appears fixed. There is severe  thickening of the aortic valve. Aortic valve regurgitation is mild to  moderate. Moderate to severe aortic valve   stenosis. Aortic regurgitation PHT measures 449 msec. Aortic valve area,  by VTI measures 0.52 cm. Aortic valve mean gradient measures 32.0 mmHg.  Aortic valve Vmax measures 3.80 m/s.   5. The inferior vena cava is normal in size with greater than 50%  respiratory variability, suggesting right atrial pressure of 3 mmHg.   6. Compared to study dated 02/12/2021, the mean AVG has decreased from 34  to 41mmHg, Vmax has increased from 3.63 to 3.61m/s and DI has decreased  from 0.20 to 0.19.   FINDINGS   Left Ventricle: Left ventricular ejection fraction, by estimation, is 60  to 65%. Left ventricular ejection fraction by 3D volume is 60 %. The left  ventricle has normal function. The left ventricle has no  regional wall  motion abnormalities. The left  ventricular internal cavity size was normal in size. There is mild left  ventricular hypertrophy of the basal-septal segment. Left ventricular  diastolic parameters are consistent with Grade I diastolic dysfunction  (impaired relaxation). Elevated left  ventricular end-diastolic pressure.   Right Ventricle: The right ventricular size is normal. No increase in  right ventricular wall thickness. Right ventricular systolic function is  normal.   Left Atrium: Left atrial size was normal in size.   Right Atrium: Right atrial size was normal in size.   Pericardium: There is no evidence of pericardial effusion.   Mitral Valve: The mitral valve is normal in structure. Trivial mitral  valve regurgitation. No evidence of mitral valve stenosis.   Tricuspid Valve: The tricuspid valve is normal in structure. Tricuspid  valve regurgitation is not demonstrated. No evidence of tricuspid  stenosis.   Aortic Valve: The aortic valve is calcified. There is severe calcifcation  of the aortic valve. There is severe thickening of the aortic valve.  Aortic valve regurgitation is mild to moderate. Aortic regurgitation PHT  measures 449 msec. Moderate to severe  aortic stenosis is present. Aortic valve mean gradient measures 32.0 mmHg.  Aortic valve peak gradient measures 57.8 mmHg. Aortic valve area, by VTI  measures 0.52 cm.   Pulmonic Valve: The pulmonic valve was normal in structure. Pulmonic valve  regurgitation is trivial. No evidence of pulmonic stenosis.   Aorta: The aortic root is normal in size and structure.   Venous: The inferior vena cava is normal in size with greater than 50%  respiratory variability, suggesting right atrial pressure of 3 mmHg.   IAS/Shunts: No atrial level shunt detected by color flow Doppler.      LEFT VENTRICLE  PLAX 2D  LVIDd:         4.80 cm         Diastology  LVIDs:         3.10 cm         LV e' medial:     5.22 cm/s  LV PW:         0.70 cm         LV E/e' medial:  13.7  LV IVS:        1.20 cm         LV e' lateral:   6.09 cm/s  LVOT diam:     2.00 cm         LV E/e' lateral: 11.7  LV SV:         45  LV SV Index:   26  LVOT Area:     3.14 cm        3D Volume EF                                 LV 3D EF:    Left                                              ventricul                                              ar  ejection                                              fraction                                              by 3D                                              volume is                                              60 %.                                    3D Volume EF:                                 3D EF:        60 %                                 LV EDV:       109 ml                                 LV ESV:       44 ml                                 LV SV:        66 ml   RIGHT VENTRICLE  RV S prime:     12.10 cm/s  TAPSE (M-mode): 1.9 cm   LEFT ATRIUM             Index        RIGHT ATRIUM           Index  LA diam:        3.50 cm 1.99 cm/m   RA Area:     15.90 cm  LA Vol (A2C):   41.0 ml 23.29 ml/m  RA Volume:   42.30 ml  24.03 ml/m  LA Vol (A4C):   53.1 ml 30.17 ml/m  LA Biplane Vol: 46.5 ml 26.42 ml/m   AORTIC VALVE  AV Area (Vmax):    0.63 cm  AV Area (Vmean):   0.62 cm  AV Area (VTI):     0.52 cm  AV Vmax:           380.00 cm/s  AV Vmean:          266.000 cm/s  AV VTI:            0.874 m  AV Peak Grad:      57.8  mmHg  AV Mean Grad:      32.0 mmHg  LVOT Vmax:         76.35 cm/s  LVOT Vmean:        52.450 cm/s  LVOT VTI:          0.145 m  LVOT/AV VTI ratio: 0.17  AI PHT:            449 msec     AORTA  Ao Root diam: 3.60 cm  Ao Asc diam:  3.50 cm   MITRAL VALVE  MV Area (PHT): cm         SHUNTS  MV Decel Time: 152 msec    Systemic VTI:  0.14 m  MV E velocity: 71.50 cm/s  Systemic Diam: 2.00 cm  MV A  velocity: 88.45 cm/s  MV E/A ratio:  0.81   Recent Labs: 02/27/2021: BNP 102.2 09/05/2021: BUN 32; Creatinine, Ser 1.12; Hemoglobin 13.8; Platelets 364; Potassium 4.8; Sodium 137    Wt Readings from Last 3 Encounters:  09/05/21 192 lb (87.1 kg)  08/23/21 193 lb 3.2 oz (87.6 kg)  07/16/21 169 lb 6.4 oz (76.8 kg)     Other studies Reviewed: Additional studies/ records that were reviewed today include: echo images, EKG, office notes Review of the above records demonstrates: severe AS  STS Risk Score: Risk of Mortality:  3.047%  Renal Failure:  3.919%  Permanent Stroke:  1.721%  Prolonged Ventilation:  8.707%  DSW Infection:  0.189%  Reoperation:  2.823%  Morbidity or Mortality:  12.507%  Short Length of Stay:  21.426%  Long Length of Stay:  8.286%   Assessment and Plan:   1. Severe Aortic Valve Stenosis: She has severe, stage D aortic valve stenosis. I have personally reviewed the echo images. The aortic valve is thickened, calcified with limited leaflet mobility. I think she would benefit from AVR. Given advanced age, she is not a good candidate for conventional AVR by surgical approach. I think she may be a good candidate for TAVR.   I have reviewed the natural history of aortic stenosis with the patient and their family members  who are present today. We have discussed the limitations of medical therapy and the poor prognosis associated with symptomatic aortic stenosis. We have reviewed potential treatment options, including palliative medical therapy, conventional surgical aortic valve replacement, and transcatheter aortic valve replacement. We discussed treatment options in the context of the patient's specific comorbid medical conditions.   She would like to proceed with planning for TAVR. I will arrange a right and left heart catheterization at [facility] 09/17/21 at 9am. Risks and benefits of the cath procedure and the valve procedure are reviewed with the patient. After the  cath, she will have a cardiac CT, CTA of the chest/abdomen and pelvis and will then be referred to see one of the CT surgeons on our TAVR team.      Current medicines are reviewed at length with the patient today.  The patient does not have concerns regarding medicines.  The following changes have been made:  no change  Labs/ tests ordered today include:   Orders Placed This Encounter  Procedures   CBC   Basic metabolic panel      Disposition:   F/U with the valve team.    Signed, Lauree Chandler, MD 09/05/2021 2:02 PM    [facility] [address], [place], [place]  [address] Phone: [phone]; Fax: [phone]

## 2021-09-05 ENCOUNTER — Encounter: Payer: Self-pay | Admitting: Cardiovascular Disease

## 2021-09-05 ENCOUNTER — Ambulatory Visit: Payer: Medicare PPO | Admitting: Physician Assistant

## 2021-09-05 ENCOUNTER — Ambulatory Visit: Payer: Medicare PPO | Admitting: Cardiovascular Disease

## 2021-09-05 ENCOUNTER — Encounter: Payer: Self-pay | Admitting: Physician Assistant

## 2021-09-05 ENCOUNTER — Other Ambulatory Visit: Payer: Self-pay

## 2021-09-05 VITALS — BP 126/70 | HR 104 | Ht 61.0 in | Wt 192.0 lb

## 2021-09-05 DIAGNOSIS — I350 Nonrheumatic aortic (valve) stenosis: Secondary | ICD-10-CM

## 2021-09-05 DIAGNOSIS — M1712 Unilateral primary osteoarthritis, left knee: Secondary | ICD-10-CM

## 2021-09-05 DIAGNOSIS — Z01812 Encounter for preprocedural laboratory examination: Secondary | ICD-10-CM

## 2021-09-05 DIAGNOSIS — Z01810 Encounter for preprocedural cardiovascular examination: Secondary | ICD-10-CM | POA: Diagnosis not present

## 2021-09-05 LAB — CBC
Hematocrit: 41.2 % (ref 34.0–46.6)
Hemoglobin: 13.8 g/dL (ref 11.1–15.9)
MCH: 29.9 pg (ref 26.6–33.0)
MCHC: 33.5 g/dL (ref 31.5–35.7)
MCV: 89 fL (ref 79–97)
Platelets: 364 10*3/uL (ref 150–450)
RBC: 4.62 x10E6/uL (ref 3.77–5.28)
RDW: 13 % (ref 11.7–15.4)
WBC: 6.9 10*3/uL (ref 3.4–10.8)

## 2021-09-05 LAB — BASIC METABOLIC PANEL
BUN/Creatinine Ratio: 29 — ABNORMAL HIGH (ref 12–28)
BUN: 32 mg/dL — ABNORMAL HIGH (ref 8–27)
CO2: 33 mmol/L — ABNORMAL HIGH (ref 20–29)
Calcium: 10 mg/dL (ref 8.7–10.3)
Chloride: 96 mmol/L (ref 96–106)
Creatinine, Ser: 1.12 mg/dL — ABNORMAL HIGH (ref 0.57–1.00)
Glucose: 105 mg/dL — ABNORMAL HIGH (ref 70–99)
Potassium: 4.8 mmol/L (ref 3.5–5.2)
Sodium: 137 mmol/L (ref 134–144)
eGFR: 50 mL/min/{1.73_m2} — ABNORMAL LOW (ref >59)

## 2021-09-05 MED ORDER — METHYLPREDNISOLONE ACETATE 40 MG/ML IJ SUSP
40.0000 mg | INTRAMUSCULAR | Status: AC | PRN
Start: 2021-09-05 — End: 2021-09-05
  Administered 2021-09-05: 40 mg via INTRA_ARTICULAR

## 2021-09-05 MED ORDER — LIDOCAINE HCL 1 % IJ SOLN
3.0000 mL | INTRAMUSCULAR | Status: AC | PRN
Start: 2021-09-05 — End: 2021-09-05
  Administered 2021-09-05: 3 mL

## 2021-09-05 NOTE — Patient Instructions (Addendum)
Medication Instructions:  No changes *If you need a refill on your cardiac medications before your next appointment, please call your pharmacy*   Lab Work: Today: BMET, CBC   Testing/Procedures: Your physician has requested that you have a cardiac catheterization. Cardiac catheterization is used to diagnose and/or treat various heart conditions. Doctors may recommend this procedure for a number of different reasons. The most common reason is to evaluate chest pain. Chest pain can be a symptom of coronary artery disease (CAD), and cardiac catheterization can show whether plaque is narrowing or blocking your heart's arteries. This procedure is also used to evaluate the valves, as well as measure the blood flow and oxygen levels in different parts of your heart. For further information please visit [email or web address]. Please follow instruction sheet, as given.   Follow-Up: Per Structural Heart Valve Team  Other Instructions  [facility] [place], [address] [place] [address] Dept: [phone] Loc: [phone]  Jenna [name]  09/05/2021  You are scheduled for a Cardiac Catheterization on Monday, December 12 with Dr. Lauree Chandler.  1. Please arrive at the [facility] (Main Entrance A) at [facility]: [address] [place], [place] [address] at 7:00 AM (This time is two hours before your procedure to ensure your preparation). Free valet parking service is available.   Special note: Every effort is made to have your procedure done on time. Please understand that emergencies sometimes delay scheduled procedures.  2. Diet: Do not eat solid foods after midnight.  The patient may have clear liquids until 5am upon the day of the procedure.  3. Labs: You will need to have blood drawn today. You do not need to be fasting.  4. Medication instructions in preparation for your  procedure:   Contrast Allergy: No  Do not take Eliquis after Dec 9.   Hold Lasix day of procedure.  On the morning of your procedure, take your Aspirin 81 mg and any morning medicines NOT listed above.  You may use sips of water.  5. Plan for one night stay--bring personal belongings. 6. Bring a current list of your medications and current insurance cards. 7. You MUST have a responsible person to drive you home. 8. Someone MUST be with you the first 24 hours after you arrive home or your discharge will be delayed. 9. Please wear clothes that are easy to get on and off and wear slip-on shoes.  Thank you for allowing [place] to care for you!   -- [facility]

## 2021-09-05 NOTE — Progress Notes (Signed)
Pre Surgical Assessment: 5 M Walk Test  20M=16.61ft  5 Meter Walk Test- trial 1: 13.13 seconds 5 Meter Walk Test- trial 2: 12.41 seconds 5 Meter Walk Test- trial 3: 12.12 seconds 5 Meter Walk Test Average: 12.55 seconds

## 2021-09-05 NOTE — Progress Notes (Signed)
[facility] Summary Report / Loop Recorder 

## 2021-09-05 NOTE — Progress Notes (Signed)
HPI: Jenna [name] returns today due to left knee pain.  We last saw her in October 2021 and at that time gave her an injection with cortisone in the left knee.  She stated that the knee did well until just this Thanksgiving.  She has had no new injuries.  She has known osteoarthritis involving her left knee.  History of right total knee arthroplasty and she is doing well.  She does report that she has aortic stenosis and needs an aortic valve replacement.  Review of systems: See HPI otherwise negative  Physical exam: General well-developed well-nourished female no acute distress mood and affect appropriate.  Ambulates with a quad cane.  Left knee: No abnormal warmth.  Slight effusion.  Overall good range of motion.     Procedure Note  Patient: Jenna [name]             Date of Birth: 05-Jun-1941           MRN: [id number]             Visit Date: 09/05/2021  Procedures: Visit Diagnoses:  1. Primary osteoarthritis of left knee     Large Joint Inj: L knee on 09/05/2021 1:49 PM Indications: pain Details: 22 G 1.5 in needle, superolateral approach  Arthrogram: No  Medications: 3 mL lidocaine 1 %; 40 mg methylPREDNISolone acetate 40 MG/ML Aspirate: 12 mL yellow and blood-tinged Outcome: tolerated well, no immediate complications Procedure, treatment alternatives, risks and benefits explained, specific risks discussed. Consent was given by the patient. Immediately prior to procedure a time out was called to verify the correct patient, procedure, equipment, support staff and site/side marked as required. Patient was prepped and draped in the usual sterile fashion.    Plan: She will follow-up with [place] as needed in regards to the left knee.  Ace wrap was applied to the leg today she is to remove this before going to bed this evening.  She understands she needs to wait at least 3 months between cortisone injections.  Questions were encouraged and answered.

## 2021-09-10 ENCOUNTER — Other Ambulatory Visit: Payer: Self-pay

## 2021-09-10 DIAGNOSIS — I350 Nonrheumatic aortic (valve) stenosis: Secondary | ICD-10-CM

## 2021-09-13 ENCOUNTER — Telehealth: Payer: Self-pay | Admitting: *Deleted

## 2021-09-13 NOTE — Telephone Encounter (Signed)
Cardiac catheterization scheduled at [facility] for: Monday September 17, 2021 [facility] Main Entrance A [facility]) at: 7 AM   Diet-no solid food after midnight prior to cath, clear liquids until 5 AM day of procedure.  Medication instructions for procedure: -Hold:  Eliquis-none 09/15/21 until post procedure  Lasix/Spironolactone-AM of procedure -Except hold medications usual morning medications can be taken pre-cath with sips of water including aspirin 81 mg.    Confirmed patient has responsible adult to drive home post procedure and be with patient first 24 hours after arriving home.  [facility] does allow one visitor to accompany you and wait in the hospital waiting room while you are there for your procedure. You and your visitor will be asked to wear a mask once you enter the hospital.   Patient reports does not currently have any new symptoms concerning for COVID-19 and no household members with COVID-19 like illness.        Reviewed procedure/mask/visitor instructions with patient.

## 2021-09-17 ENCOUNTER — Ambulatory Visit (HOSPITAL_COMMUNITY)
Admission: RE | Admit: 2021-09-17 | Discharge: 2021-09-17 | Disposition: A | Discharge status: Home / Self Care | Payer: Medicare PPO | Source: Ambulatory Visit | Attending: Cardiovascular Disease | Admitting: Cardiovascular Disease

## 2021-09-17 ENCOUNTER — Encounter (HOSPITAL_COMMUNITY)
Admission: RE | Disposition: A | Discharge status: Home / Self Care | Payer: Self-pay | Source: Ambulatory Visit | Attending: Cardiovascular Disease

## 2021-09-17 ENCOUNTER — Encounter (HOSPITAL_COMMUNITY): Payer: Self-pay | Admitting: Cardiovascular Disease

## 2021-09-17 ENCOUNTER — Other Ambulatory Visit: Payer: Self-pay

## 2021-09-17 DIAGNOSIS — I428 Other cardiomyopathies: Secondary | ICD-10-CM | POA: Diagnosis not present

## 2021-09-17 DIAGNOSIS — I272 Pulmonary hypertension, unspecified: Secondary | ICD-10-CM | POA: Diagnosis not present

## 2021-09-17 DIAGNOSIS — I350 Nonrheumatic aortic (valve) stenosis: Secondary | ICD-10-CM | POA: Diagnosis not present

## 2021-09-17 DIAGNOSIS — I130 Hypertensive heart and chronic kidney disease with heart failure and stage 1 through stage 4 chronic kidney disease, or unspecified chronic kidney disease: Secondary | ICD-10-CM | POA: Diagnosis not present

## 2021-09-17 DIAGNOSIS — I5022 Chronic systolic (congestive) heart failure: Secondary | ICD-10-CM | POA: Diagnosis not present

## 2021-09-17 DIAGNOSIS — I2510 Atherosclerotic heart disease of native coronary artery without angina pectoris: Secondary | ICD-10-CM | POA: Insufficient documentation

## 2021-09-17 DIAGNOSIS — I352 Nonrheumatic aortic (valve) stenosis with insufficiency: Secondary | ICD-10-CM | POA: Insufficient documentation

## 2021-09-17 DIAGNOSIS — N183 Chronic kidney disease, stage 3 unspecified: Secondary | ICD-10-CM | POA: Diagnosis not present

## 2021-09-17 DIAGNOSIS — Z7901 Long term (current) use of anticoagulants: Secondary | ICD-10-CM | POA: Insufficient documentation

## 2021-09-17 DIAGNOSIS — F32A Depression, unspecified: Secondary | ICD-10-CM | POA: Insufficient documentation

## 2021-09-17 DIAGNOSIS — E1122 Type 2 diabetes mellitus with diabetic chronic kidney disease: Secondary | ICD-10-CM | POA: Diagnosis not present

## 2021-09-17 DIAGNOSIS — I4891 Unspecified atrial fibrillation: Secondary | ICD-10-CM | POA: Diagnosis not present

## 2021-09-17 HISTORY — PX: RIGHT/LEFT HEART CATH AND CORONARY ANGIOGRAPHY: CATH118266

## 2021-09-17 LAB — POCT I-STAT EG7
Acid-Base Excess: 2 mmol/L (ref 0.0–2.0)
Bicarbonate: 28.5 mmol/L — ABNORMAL HIGH (ref 20.0–28.0)
Calcium, Ion: 1.24 mmol/L (ref 1.15–1.40)
HCT: 36 % (ref 36.0–46.0)
Hemoglobin: 12.2 g/dL (ref 12.0–15.0)
O2 Saturation: 72 %
Potassium: 3.9 mmol/L (ref 3.5–5.1)
Sodium: 141 mmol/L (ref 135–145)
TCO2: 30 mmol/L (ref 22–32)
pCO2, Ven: 53.2 mmHg (ref 44.0–60.0)
pH, Ven: 7.337 (ref 7.250–7.430)
pO2, Ven: 41 mmHg (ref 32.0–45.0)

## 2021-09-17 LAB — POCT I-STAT 7, (LYTES, BLD GAS, ICA,H+H)
Acid-Base Excess: 2 mmol/L (ref 0.0–2.0)
Bicarbonate: 28.8 mmol/L — ABNORMAL HIGH (ref 20.0–28.0)
Calcium, Ion: 1.24 mmol/L (ref 1.15–1.40)
HCT: 36 % (ref 36.0–46.0)
Hemoglobin: 12.2 g/dL (ref 12.0–15.0)
O2 Saturation: 99 %
Potassium: 3.9 mmol/L (ref 3.5–5.1)
Sodium: 141 mmol/L (ref 135–145)
TCO2: 30 mmol/L (ref 22–32)
pCO2 arterial: 52.7 mmHg — ABNORMAL HIGH (ref 32.0–48.0)
pH, Arterial: 7.346 — ABNORMAL LOW (ref 7.350–7.450)
pO2, Arterial: 159 mmHg — ABNORMAL HIGH (ref 83.0–108.0)

## 2021-09-17 LAB — GLUCOSE, CAPILLARY: Glucose-Capillary: 103 mg/dL — ABNORMAL HIGH (ref 70–99)

## 2021-09-17 SURGERY — RIGHT/LEFT HEART CATH AND CORONARY ANGIOGRAPHY
Anesthesia: LOCAL

## 2021-09-17 MED ORDER — HEPARIN SODIUM (PORCINE) 1000 UNIT/ML IJ SOLN
INTRAMUSCULAR | Status: AC
  Filled 2021-09-17: qty 10

## 2021-09-17 MED ORDER — MIDAZOLAM HCL 2 MG/2ML IJ SOLN
INTRAMUSCULAR | Status: AC
  Filled 2021-09-17: qty 2

## 2021-09-17 MED ORDER — VERAPAMIL HCL 2.5 MG/ML IV SOLN
INTRAVENOUS | Status: AC
  Filled 2021-09-17: qty 2

## 2021-09-17 MED ORDER — SODIUM CHLORIDE 0.9 % WEIGHT BASED INFUSION: 1.0000 mL/kg/h | INTRAVENOUS | Status: DC

## 2021-09-17 MED ORDER — LIDOCAINE HCL (PF) 1 % IJ SOLN
INTRAMUSCULAR | Status: AC
  Filled 2021-09-17: qty 30

## 2021-09-17 MED ORDER — MIDAZOLAM HCL 2 MG/2ML IJ SOLN
INTRAMUSCULAR | Status: DC | PRN
  Administered 2021-09-17: 1 mg via INTRAVENOUS

## 2021-09-17 MED ORDER — IOHEXOL 350 MG/ML SOLN
INTRAVENOUS | Status: DC | PRN
  Administered 2021-09-17: 45 mL

## 2021-09-17 MED ORDER — HEPARIN (PORCINE) IN NACL 1000-0.9 UT/500ML-% IV SOLN
INTRAVENOUS | Status: DC | PRN
  Administered 2021-09-17 (×2): 500 mL

## 2021-09-17 MED ORDER — VERAPAMIL HCL 2.5 MG/ML IV SOLN
INTRAVENOUS | Status: DC | PRN
  Administered 2021-09-17: 10 mL via INTRA_ARTERIAL

## 2021-09-17 MED ORDER — SODIUM CHLORIDE 0.9% FLUSH: 3.0000 mL | Freq: Two times a day (BID) | INTRAVENOUS | Status: DC

## 2021-09-17 MED ORDER — LIDOCAINE HCL (PF) 1 % IJ SOLN
INTRAMUSCULAR | Status: DC | PRN
  Administered 2021-09-17 (×2): 2 mL

## 2021-09-17 MED ORDER — FENTANYL CITRATE (PF) 100 MCG/2ML IJ SOLN
INTRAMUSCULAR | Status: DC | PRN
  Administered 2021-09-17: 25 ug via INTRAVENOUS

## 2021-09-17 MED ORDER — SODIUM CHLORIDE 0.9 % IV SOLN: 250.0000 mL | INTRAVENOUS | Status: DC | PRN

## 2021-09-17 MED ORDER — FENTANYL CITRATE (PF) 100 MCG/2ML IJ SOLN
INTRAMUSCULAR | Status: AC
  Filled 2021-09-17: qty 2

## 2021-09-17 MED ORDER — SODIUM CHLORIDE 0.9 % WEIGHT BASED INFUSION
3.0000 mL/kg/h | INTRAVENOUS | Status: AC
  Administered 2021-09-17: 3 mL/kg/h via INTRAVENOUS

## 2021-09-17 MED ORDER — HEPARIN (PORCINE) IN NACL 1000-0.9 UT/500ML-% IV SOLN
INTRAVENOUS | Status: AC
  Filled 2021-09-17: qty 500

## 2021-09-17 MED ORDER — SODIUM CHLORIDE 0.9% FLUSH: 3.0000 mL | INTRAVENOUS | Status: DC | PRN

## 2021-09-17 MED ORDER — ASPIRIN 81 MG PO CHEW: 81.0000 mg | CHEWABLE_TABLET | ORAL | Status: DC

## 2021-09-17 MED ORDER — HEPARIN SODIUM (PORCINE) 1000 UNIT/ML IJ SOLN
INTRAMUSCULAR | Status: DC | PRN
  Administered 2021-09-17: 4500 [IU] via INTRAVENOUS

## 2021-09-17 SURGICAL SUPPLY — 12 items
CATH 5FR JL3.5 JR4 ANG PIG MP (CATHETERS) ×1 IMPLANT
CATH BALLN WEDGE 5F 110CM (CATHETERS) ×1 IMPLANT
CATH INFINITI 5FR AL1 (CATHETERS) ×1 IMPLANT
DEVICE RAD COMP TR BAND LRG (VASCULAR PRODUCTS) ×1 IMPLANT
GLIDESHEATH SLEND SS 6F .021 (SHEATH) ×1 IMPLANT
GUIDEWIRE INQWIRE 1.5J.035X260 (WIRE) IMPLANT
INQWIRE 1.5J .035X260CM (WIRE) ×2
KIT HEART LEFT (KITS) ×3 IMPLANT
PACK CARDIAC CATHETERIZATION (CUSTOM PROCEDURE TRAY) ×3 IMPLANT
SHEATH GLIDE SLENDER 4/5FR (SHEATH) ×1 IMPLANT
TRANSDUCER W/STOPCOCK (MISCELLANEOUS) ×3 IMPLANT
TUBING CIL FLEX 10 FLL-RA (TUBING) ×3 IMPLANT

## 2021-09-17 NOTE — Interval H&P Note (Signed)
History and Physical Interval Note:  09/17/2021 8:23 AM  Jenna [name]  has presented today for surgery, with the diagnosis of aortic stenosis.  The various methods of treatment have been discussed with the patient and family. After consideration of risks, benefits and other options for treatment, the patient has consented to  Procedure(s): RIGHT/LEFT HEART CATH AND CORONARY ANGIOGRAPHY (N/A) as a surgical intervention.  The patient's history has been reviewed, patient examined, no change in status, stable for surgery.  I have reviewed the patient's chart and labs.  Questions were answered to the patient's satisfaction.    Cath Lab Visit (complete for each Cath Lab visit)  Clinical Evaluation Leading to the Procedure:   ACS: No.  Non-ACS:    Anginal Classification: CCS II  Anti-ischemic medical therapy: Minimal Therapy (1 class of medications)  Non-Invasive Test Results: No non-invasive testing performed  Prior CABG: No previous CABG        Lauree Chandler

## 2021-09-17 NOTE — Discharge Instructions (Addendum)
Resume Eliquis tomorrow if no bleeding from cath access sites in right arm  Drink plenty of fluid for 48 hours and keep wrist elevated at heart level for 24 hours  Radial Site Care   This sheet gives you information about how to care for yourself after your procedure. Your health care provider may also give you more specific instructions. If you have problems or questions, contact your health care provider. What can I expect after the procedure? After the procedure, it is common to have: Bruising and tenderness at the catheter insertion area. Follow these instructions at home: Medicines Take over-the-counter and prescription medicines only as told by your health care provider. Insertion site care Follow instructions from your health care provider about how to take care of your insertion site. Make sure you: Wash your hands with soap and water before you change your bandage (dressing). If soap and water are not available, use hand sanitizer. remove your dressing as told by your health care provider. In 24 hours Check your insertion site every day for signs of infection. Check for: Redness, swelling, or pain. Fluid or blood. Pus or a bad smell. Warmth. Do not take baths, swim, or use a hot tub until your health care provider approves. You may shower 24-48 hours after the procedure, or as directed by your health care provider. Remove the dressing and gently wash the site with plain soap and water. Pat the area dry with a clean towel. Do not rub the site. That could cause bleeding. Do not apply powder or lotion to the site. Activity   For 24 hours after the procedure, or as directed by your health care provider: Do not flex or bend the affected arm. Do not push or pull heavy objects with the affected arm. Do not drive yourself home from the hospital or clinic. You may drive 24 hours after the procedure unless your health care provider tells you not to. Do not operate machinery or  power tools. Do not lift anything that is heavier than 10 lb (4.5 kg), or the limit that you are told, until your health care provider says that it is safe. For 4 days Ask your health care provider when it is okay to: Return to work or school. Resume usual physical activities or sports. Resume sexual activity. General instructions If the catheter site starts to bleed, raise your arm and put firm pressure on the site. If the bleeding does not stop, get help right away. This is a medical emergency. If you went home on the same day as your procedure, a responsible adult should be with you for the first 24 hours after you arrive home. Keep all follow-up visits as told by your health care provider. This is important. Contact a health care provider if: You have a fever. You have redness, swelling, or yellow drainage around your insertion site. Get help right away if: You have unusual pain at the radial site. The catheter insertion area swells very fast. The insertion area is bleeding, and the bleeding does not stop when you hold steady pressure on the area. Your arm or hand becomes pale, cool, tingly, or numb. These symptoms may represent a serious problem that is an emergency. Do not wait to see if the symptoms will go away. Get medical help right away. Call your local emergency services (911 in the [place] Do not drive yourself to the hospital. Summary After the procedure, it is common to have bruising and tenderness at the site. Follow  instructions from your health care provider about how to take care of your radial site wound. Check the wound every day for signs of infection. Do not lift anything that is heavier than 10 lb (4.5 kg), or the limit that you are told, until your health care provider says that it is safe. This information is not intended to replace advice given to you by your health care provider. Make sure you discuss any questions you have with your health care provider. Document  Revised: 10/29/2017 Document Reviewed: 10/29/2017 Elsevier Patient Education  2020 [employer].

## 2021-09-17 NOTE — Progress Notes (Signed)
Patient and daughter was given discharge instructions. Both verbalized understanding. 

## 2021-09-26 ENCOUNTER — Ambulatory Visit (HOSPITAL_COMMUNITY)
Admission: RE | Admit: 2021-09-26 | Discharge: 2021-09-26 | Disposition: A | Discharge status: Home / Self Care | Payer: Medicare PPO | Source: Ambulatory Visit | Attending: Cardiovascular Disease | Admitting: Cardiovascular Disease

## 2021-09-26 ENCOUNTER — Other Ambulatory Visit: Payer: Self-pay

## 2021-09-26 DIAGNOSIS — I350 Nonrheumatic aortic (valve) stenosis: Secondary | ICD-10-CM

## 2021-09-26 MED ORDER — IOHEXOL 350 MG/ML SOLN
100.0000 mL | Freq: Once | INTRAVENOUS | Status: AC | PRN
  Administered 2021-09-26: 11:00:00 100 mL via INTRAVENOUS

## 2021-09-27 LAB — CUP PACEART REMOTE DEVICE CHECK
Date Time Interrogation Session: 20221217235945
Implantable Pulse Generator Implant Date: 20210322

## 2021-10-02 ENCOUNTER — Telehealth: Payer: Self-pay

## 2021-10-02 ENCOUNTER — Ambulatory Visit (INDEPENDENT_AMBULATORY_CARE_PROVIDER_SITE_OTHER): Payer: Medicare PPO

## 2021-10-02 DIAGNOSIS — I428 Other cardiomyopathies: Secondary | ICD-10-CM | POA: Diagnosis not present

## 2021-10-02 NOTE — Telephone Encounter (Signed)
LINQ alert received.  5 AF episodes, longest 1hr 43min, mean HR 146 Burden 0.1%, Coreg, Eliquis prescribed Route for prolonged HVR LR   Successful telephone encounter to patient to follow up on s/s of SVT. Patient states she is doing ok but not feeling well. Unable to express how she feels. Denies pain. Very lethargic and slow to respond. Patient admits to taking xanax this am. Does not recall rapid heart rate yesterday. Neuro intact to person, place, time, president, year, address, and situation. Encouraged patient to allow this RN to call son/daughter for face to face check. Patient refuses for RN to contact family. Admits to taking eliquis as prescribed. Of note, coreg was decreased to 6.25 BID at last office visit with Dr. Rayann Heman 07/28/21.

## 2021-10-11 NOTE — Progress Notes (Signed)
[facility] Summary Report / Loop Recorder 

## 2021-10-17 ENCOUNTER — Institutional Professional Consult (permissible substitution): Payer: Medicare PPO | Admitting: Surgery

## 2021-10-17 ENCOUNTER — Other Ambulatory Visit: Payer: Self-pay

## 2021-10-17 ENCOUNTER — Encounter: Payer: Self-pay | Admitting: Surgery

## 2021-10-17 VITALS — BP 137/72 | HR 69 | Resp 20 | Ht 60.0 in | Wt 189.0 lb

## 2021-10-17 DIAGNOSIS — I350 Nonrheumatic aortic (valve) stenosis: Secondary | ICD-10-CM

## 2021-10-17 NOTE — Progress Notes (Signed)
Patient ID: Jenna [name], female   DOB: [birth year], 81 y.o.   MRN: [id number]  [facility]IP[facility] VALVE CLINIC         [address]       [place] [address]             [phone]          CARDIOTHORACIC SURGERY CONSULTATION REPORT  PCP is Chesley Noon, MD Referring Provider is Lauree Chandler, MD Primary Cardiologist is [name], MD  Reason for consultation:  Severe aortic stenosis  HPI:  The patient is a 81 year old woman with a history of persistent atrial fibrillation on Eliquis, hypertension, hyperlipidemia, hypothyroidism, type 2 diabetes, stage III chronic kidney disease, OSA on CPAP, depression, and aortic stenosis who presents with progressive exertional fatigue and shortness of breath as well as pressure across her chest.  She reports dizziness but no syncope.  She has had some lower extremity edema managed with Lasix.  An echocardiogram on 08/16/2021 showed a mean gradient across aortic valve of 32 mmHg with a valve area of 0.52 cm by VTI.  There is mild to moderate aortic insufficiency with a pressure half-time of 449 ms.  There is severe calcification and thickening of the aortic valve with restricted mobility.  Left ventricular ejection fraction was 60 to 65% with mild LVH and grade 1 diastolic dysfunction.  She is retired from [employer] and lives alone since her husband died.  She has a son who lives close to her.  She said that her main issue recently has been depression.  She was previously on Wellbutrin but was weaned off of that and started on Prozac by her primary physician.  She said that she stopped the Prozac recently because she did not feel it was helping her and she feels out of sorts now.  Past Medical History:  Diagnosis Date   Anxiety    CHF (congestive heart failure) (HCC)    Chronic lower back pain    Chronic systolic dysfunction of left ventricle     Depression    Dysrhythmia    A-Fib   Fatigue    GERD (gastroesophageal reflux disease)    High cholesterol    Hypertension    Hypothyroidism    Melanoma of shoulder, left (HCC)    Mild aortic stenosis    Nonischemic cardiomyopathy (HCC)    OSA on CPAP    reports compliance with CPAP   Osteoarthritis    Persistent atrial fibrillation with RVR ([place])    [name] 11/15/2016   PONV (postoperative nausea and vomiting)    RLS (restless legs syndrome)    S/P ablation of atrial flutter/ fib 01/10/17 01/11/2017   Type II diabetes mellitus ([place])    no medications    Past Surgical History:  Procedure Laterality Date   ATRIAL FIBRILLATION ABLATION N/A 01/10/2017   Procedure: Atrial Fibrillation Ablation;  Surgeon: Thompson Grayer, MD;  Location: [place];  Service: Cardiovascular;  Laterality: N/A;   CARDIOVERSION N/A 11/19/2016   Procedure: CARDIOVERSION;  Surgeon: Lelon Perla, MD;  Location: [facility] ENDOSCOPY;  Service: Cardiovascular;  Laterality: N/A;   CARDIOVERSION N/A 12/11/2016   Procedure: CARDIOVERSION;  Surgeon: Dorothy Spark, MD;  Location: [facility];  Service: Cardiovascular;  Laterality: N/A;   CARDIOVERSION N/A 12/18/2016   Procedure: CARDIOVERSION;  Surgeon: Skeet Latch, MD;  Location: [facility];  Service: Cardiovascular;  Laterality: N/A;   CATARACT  EXTRACTION W/ INTRAOCULAR LENS  IMPLANT, BILATERAL Bilateral    DILATION AND CURETTAGE OF UTERUS     implantable loop recorder placement  12/27/2019   Medtronic Reveal Linq model LNQ 11 (SN [id number]) implanted in office by Dr Clide Deutscher [place]  05/13/2012   No ECG changes. EKG negative for ischemia. No significant ischemia demonstrated.   MELANOMA EXCISION Left ~ 09/1979   "back of my shoulder"   RIGHT/LEFT HEART CATH AND CORONARY ANGIOGRAPHY N/A 09/17/2021   Procedure: RIGHT/LEFT HEART CATH AND CORONARY ANGIOGRAPHY;  Surgeon: Burnell Blanks, MD;  Location: [facility];  Service:  Cardiovascular;  Laterality: N/A;   TEE WITHOUT CARDIOVERSION N/A 11/19/2016   Procedure: TRANSESOPHAGEAL ECHOCARDIOGRAM (TEE);  Surgeon: Lelon Perla, MD;  Location: [facility] ENDOSCOPY;  Service: Cardiovascular;  Laterality: N/A;  need CV, but no anesthesia available   TEE WITHOUT CARDIOVERSION N/A 01/10/2017   Procedure: TRANSESOPHAGEAL ECHOCARDIOGRAM (TEE);  Surgeon: [name], MD;  Location: [facility] ENDOSCOPY;  Service: Cardiovascular;  Laterality: N/A;   TOTAL KNEE ARTHROPLASTY Right 07/28/2018   Procedure: RIGHT TOTAL KNEE ARTHROPLASTY;  Surgeon: Mcarthur Rossetti, MD;  Location: [facility];  Service: Orthopedics;  Laterality: Right;   TRANSTHORACIC ECHOCARDIOGRAM  02/18/2012   EF >55%, mild aortic stenosis   VAGINAL HYSTERECTOMY  1985    Family History  Problem Relation Age of Onset   Hypertension Mother    Diabetes Mother    Heart failure Father    Arrhythmia Sister    Hyperlipidemia Sister    Hypertension Sister    Healthy Brother    Stroke Paternal Grandmother    Cancer Paternal Grandfather    Healthy Daughter    Hypertension Son    Hyperlipidemia Son     Social History   Socioeconomic History   Marital status: Widowed    Spouse name: Not on file   Number of children: 2   Years of education: Not on file   Highest education level: Not on file  Occupational History   Occupation: [employer]  Tobacco Use   Smoking status: Never   Smokeless tobacco: Never  Vaping Use   Vaping Use: Never used  Substance and Sexual Activity   Alcohol use: No   Drug use: Never   Sexual activity: Never  Other Topics Concern   Not on file  Social History Narrative   Not on file   Social Determinants of Health   Financial Resource Strain: Not on file  Food Insecurity: Not on file  Transportation Needs: Not on file  Physical Activity: Not on file  Stress: Not on file  Social Connections: Not on file  Intimate Partner Violence: Not on file    Prior to  Admission medications   Medication Sig Start Date End Date Taking? Authorizing Provider  acetaminophen (TYLENOL) 500 MG tablet Take 1,000 mg by mouth every morning.   Yes [provider]  ALPRAZolam Duanne Moron) 0.5 MG tablet Take 0.5 mg by mouth 2 (two) times daily.  07/05/16  Yes [provider]  atorvastatin (LIPITOR) 20 MG tablet TAKE 1 TABLET (20 MG TOTAL) BY MOUTH 3 (THREE) TIMES A WEEK. Patient taking differently: Take 20 mg by mouth every Monday, Wednesday, and Friday. 11/15/20  Yes Hilty, Nadean Corwin, MD  buPROPion (WELLBUTRIN XL) 150 MG 24 hr tablet Take 150 mg by mouth every 3 (three) days. 08/07/15  Yes [provider]  Calcium Carbonate-Vitamin D (CALCIUM + D PO) Take 1 tablet by mouth  2 (two) times daily.    Yes [provider]  carvedilol (COREG) 6.25 MG tablet Take 1 tablet (6.25 mg total) by mouth 2 (two) times daily with a meal. 07/16/21  Yes Allred, Jeneen Rinks, MD  diphenhydramine-acetaminophen (TYLENOL PM) 25-500 MG TABS tablet Take 2 tablets by mouth at bedtime.   Yes [provider]  ELIQUIS 5 MG TABS tablet TAKE 1 TABLET BY MOUTH TWICE A DAY 07/24/21  Yes Hilty, Nadean Corwin, MD  FLUoxetine (PROZAC) 10 MG capsule Take 10 mg by mouth daily.   Yes [provider]  furosemide (LASIX) 40 MG tablet Take 1 tablet (40 mg total) by mouth daily. 02/15/21  Yes Hilty, Nadean Corwin, MD  hypromellose (SYSTANE OVERNIGHT THERAPY) 0.3 % GEL ophthalmic ointment Place 2 application into both eyes at bedtime.   Yes [provider]  levothyroxine (SYNTHROID, LEVOTHROID) 88 MCG tablet Take 88 mcg by mouth daily before breakfast.   Yes [provider]  loratadine (CLARITIN) 10 MG tablet Take 10 mg by mouth daily.   Yes [provider]  Magnesium 500 MG TABS Take 500 mg by mouth daily.    Yes [provider]  Multiple Vitamins-Minerals (PRESERVISION AREDS 2) CAPS Take 1 capsule by mouth 2 (two) times daily.   Yes [provider]  omeprazole (PRILOSEC) 40 MG capsule Take 40 mg by mouth every evening.   Yes [provider]  [facility] ULTRA test strip  02/08/20  Yes [provider]  Polyethyl Glycol-Propyl Glycol 0.4-0.3 % SOLN Place 2 drops into both eyes in the morning, at noon, and at bedtime.   Yes [provider]  pyridOXINE (B-6) 50 MG tablet Take 50 mg by mouth daily with supper.   Yes [provider]  rOPINIRole (REQUIP) 0.5 MG tablet Take 0.5 mg by mouth 4 (four) times daily.    Yes [provider]  spironolactone (ALDACTONE) 25 MG tablet Take 0.5 tablets (12.5 mg total) by mouth daily. 01/05/21  Yes Lendon Colonel, NP  zinc gluconate 50 MG tablet Take 50 mg by mouth daily.   Yes [provider]    Current Outpatient Medications  Medication Sig Dispense Refill   acetaminophen (TYLENOL) 500 MG tablet Take 1,000 mg by mouth every morning.     ALPRAZolam (XANAX) 0.5 MG tablet Take 0.5 mg by mouth 2 (two) times daily.      atorvastatin (LIPITOR) 20 MG tablet TAKE 1 TABLET (20 MG TOTAL) BY MOUTH 3 (THREE) TIMES A WEEK. (Patient taking differently: Take 20 mg by mouth every Monday, Wednesday, and Friday.) 36 tablet 3   buPROPion (WELLBUTRIN XL) 150 MG 24 hr tablet Take 150 mg by mouth every 3 (three) days.     Calcium Carbonate-Vitamin D (CALCIUM + D PO) Take 1 tablet by mouth 2 (two) times daily.      carvedilol (COREG) 6.25 MG tablet Take 1 tablet (6.25 mg total) by mouth 2 (two) times daily with a meal. 180 tablet 3   diphenhydramine-acetaminophen (TYLENOL PM) 25-500 MG TABS tablet Take 2 tablets by mouth at bedtime.     ELIQUIS 5 MG TABS tablet TAKE 1 TABLET BY MOUTH TWICE A DAY 180 tablet 1   FLUoxetine (PROZAC) 10 MG capsule Take 10 mg by mouth daily.     furosemide (LASIX) 40 MG tablet Take 1 tablet (40 mg total) by mouth daily. 90 tablet 3   hypromellose (SYSTANE OVERNIGHT THERAPY) 0.3 % GEL ophthalmic ointment Place 2 application into both  eyes at bedtime.     levothyroxine (SYNTHROID, LEVOTHROID) 88 MCG tablet Take 88 mcg by mouth daily before breakfast.     loratadine (CLARITIN) 10 MG tablet Take 10 mg by mouth daily.     Magnesium 500 MG TABS Take 500 mg by mouth daily.      Multiple Vitamins-Minerals (PRESERVISION AREDS 2) CAPS Take 1 capsule by mouth 2 (two) times daily.     omeprazole (PRILOSEC) 40 MG capsule Take 40 mg by mouth every evening.     ONETOUCH ULTRA test strip      Polyethyl Glycol-Propyl Glycol 0.4-0.3 % SOLN Place 2 drops into both eyes in the morning, at noon, and at bedtime.     pyridOXINE (B-6) 50 MG tablet Take 50 mg by mouth daily with supper.     rOPINIRole (REQUIP) 0.5 MG tablet Take 0.5 mg by mouth 4 (four) times daily.      spironolactone (ALDACTONE) 25 MG tablet Take 0.5 tablets (12.5 mg total) by mouth daily. 45 tablet 3   zinc gluconate 50 MG tablet Take 50 mg by mouth daily.     No current facility-administered medications for this visit.    Allergies  Allergen Reactions   Amoxicillin Nausea Only    850 mg dosage    Crestor [Rosuvastatin] Other (See Comments)    Joints ached   Lovastatin Other (See Comments)    Joints hurt all over body       Review of Systems:   General:  Normal appetite, + decreased  energy, no weight gain, no weight loss, no fever  Cardiac:  + chest pain with exertion, + chest pain at rest, +SOB with mild exertion, no resting SOB, no PND, + orthopnea, no palpitations, no arrhythmia, + atrial fibrillation, + LE edema, + dizzy spells, no syncope  Respiratory:  + shortness of breath, no home oxygen, no productive cough, no dry cough, no bronchitis, no wheezing, no hemoptysis, no asthma, no pain with inspiration or cough, + sleep apnea, + CPAP at night  GI:   no difficulty swallowing, + reflux, no frequent heartburn, no hiatal hernia, no abdominal pain, + constipation, no diarrhea, no hematochezia, no hematemesis, no melena  GU:   no dysuria,  no frequency, + urinary  tract infection, no hematuria, no kidney stones, + kidney disease  Vascular:  no pain suggestive of claudication, no pain in feet, no leg cramps, no varicose veins, no DVT, no non-healing foot ulcer  Neuro:   no stroke, no TIA's, no seizures, no headaches, no temporary blindness one eye,  no slurred speech, + peripheral neuropathy, no chronic pain, no instability of gait, no memory/cognitive dysfunction  Musculoskeletal: + arthritis, + joint swelling, no myalgias, no difficulty walking, normal mobility   Skin:   no rash, no itching, no skin infections, no pressure sores or ulcerations  Psych:   no anxiety, no depression, no nervousness, no unusual recent stress  Eyes:   no blurry vision, no floaters, no recent vision changes, + wears glasses  ENT:   no hearing loss, no loose or painful teeth, no dentures, last saw dentist Nov 2022  Hematologic:  no easy bruising, no abnormal bleeding, no clotting disorder, no frequent epistaxis  Endocrine:  + diabetes, does  check CBG's at home     Physical Exam:   BP 137/72 (BP Location: Right Arm, Patient Position: Sitting)    Pulse 69    Resp 20    Ht 5' (1.524 m)    Wt  189 lb (85.7 kg)    SpO2 96% Comment: RA   BMI 36.91 kg/m   General:  Elderly but  well-appearing  HEENT:  Unremarkable, NCAT, PERLA, EOMI  Neck:   no JVD, no bruits, no adenopathy   Chest:   clear to auscultation, symmetrical breath sounds, no wheezes, no rhonchi   CV:   RRR, 3/6 systolic murmur RSB, no diastolic murmur  Abdomen:  soft, non-tender, no masses   Extremities:  warm, well-perfused, pulses palpable at ankle, no lower extremity edema  Rectal/GU  Deferred  Neuro:   Grossly non-focal and symmetrical throughout  Skin:   Clean and dry, no rashes, no breakdown  Diagnostic Tests:  ECHOCARDIOGRAM REPORT         Patient Name:   Jenna [name] Date of Exam: 08/16/2021  Medical Rec #:  [id number]       Height:       61.0 in  Accession #:    [id number]      Weight:       169.4 lb   Date of Birth:  11-15-1940       BSA:          1.760 m  Patient Age:    37 years        BP:           108/64 mmHg  Patient Gender: F               HR:           69 bpm.  Exam Location:  [address]   Procedure: 2D Echo, 3D Echo, Cardiac Doppler and Color Doppler   Indications:    I35.0 Aortic Stenosis     History:        Patient has prior history of Echocardiogram examinations,  most                  recent 02/12/2021. CHF, Aortic Valve Disease,  Arrythmias:Atrial                  Fibrillation and Atrial Flutter, Signs/Symptoms:Edema and                  Fatigue; Risk Factors:Family History of Coronary Artery  Disease,                  Hypertension, Diabetes, Dyslipidemia and Sleep Apnea.                  Non-Ischemic Cardiomyopathy                  Aortic Stenosis (prior Mean Gradient 58mmHG).     Sonographer:    Deliah Boston RDCS  Referring Phys: Nadean Corwin HILTY   IMPRESSIONS     1. Left ventricular ejection fraction, by estimation, is 60 to 65%. Left  ventricular ejection fraction by 3D volume is 60 %. The left ventricle has  normal function. The left ventricle has no regional wall motion  abnormalities. There is mild left  ventricular hypertrophy of the basal-septal segment. Left ventricular  diastolic parameters are consistent with Grade I diastolic dysfunction  (impaired relaxation). Elevated left ventricular end-diastolic pressure.   2. Right ventricular systolic function is normal. The right ventricular  size is normal.   3. The mitral valve is normal in structure. Trivial mitral valve  regurgitation. No evidence of mitral stenosis.   4. The aortic valve is calcified. There is severe calcifcation of the  aortic valve and  the right coronary cusp appears fixed. There is severe  thickening of the aortic valve. Aortic valve regurgitation is mild to  moderate. Moderate to severe aortic valve   stenosis. Aortic regurgitation PHT measures 449 msec. Aortic valve area,   by VTI measures 0.52 cm. Aortic valve mean gradient measures 32.0 mmHg.  Aortic valve Vmax measures 3.80 m/s.   5. The inferior vena cava is normal in size with greater than 50%  respiratory variability, suggesting right atrial pressure of 3 mmHg.   6. Compared to study dated 02/12/2021, the mean AVG has decreased from 34  to 67mmHg, Vmax has increased from 3.63 to 3.25m/s and DI has decreased  from 0.20 to 0.19.   FINDINGS   Left Ventricle: Left ventricular ejection fraction, by estimation, is 60  to 65%. Left ventricular ejection fraction by 3D volume is 60 %. The left  ventricle has normal function. The left ventricle has no regional wall  motion abnormalities. The left  ventricular internal cavity size was normal in size. There is mild left  ventricular hypertrophy of the basal-septal segment. Left ventricular  diastolic parameters are consistent with Grade I diastolic dysfunction  (impaired relaxation). Elevated left  ventricular end-diastolic pressure.   Right Ventricle: The right ventricular size is normal. No increase in  right ventricular wall thickness. Right ventricular systolic function is  normal.   Left Atrium: Left atrial size was normal in size.   Right Atrium: Right atrial size was normal in size.   Pericardium: There is no evidence of pericardial effusion.   Mitral Valve: The mitral valve is normal in structure. Trivial mitral  valve regurgitation. No evidence of mitral valve stenosis.   Tricuspid Valve: The tricuspid valve is normal in structure. Tricuspid  valve regurgitation is not demonstrated. No evidence of tricuspid  stenosis.   Aortic Valve: The aortic valve is calcified. There is severe calcifcation  of the aortic valve. There is severe thickening of the aortic valve.  Aortic valve regurgitation is mild to moderate. Aortic regurgitation PHT  measures 449 msec. Moderate to severe  aortic stenosis is present. Aortic valve mean gradient measures 32.0  mmHg.  Aortic valve peak gradient measures 57.8 mmHg. Aortic valve area, by VTI  measures 0.52 cm.   Pulmonic Valve: The pulmonic valve was normal in structure. Pulmonic valve  regurgitation is trivial. No evidence of pulmonic stenosis.   Aorta: The aortic root is normal in size and structure.   Venous: The inferior vena cava is normal in size with greater than 50%  respiratory variability, suggesting right atrial pressure of 3 mmHg.   IAS/Shunts: No atrial level shunt detected by color flow Doppler.      LEFT VENTRICLE  PLAX 2D  LVIDd:         4.80 cm         Diastology  LVIDs:         3.10 cm         LV e' medial:    5.22 cm/s  LV PW:         0.70 cm         LV E/e' medial:  13.7  LV IVS:        1.20 cm         LV e' lateral:   6.09 cm/s  LVOT diam:     2.00 cm         LV E/e' lateral: 11.7  LV SV:  45  LV SV Index:   26  LVOT Area:     3.14 cm        3D Volume EF                                 LV 3D EF:    Left                                              ventricul                                              ar                                              ejection                                              fraction                                              by 3D                                              volume is                                              60 %.                                    3D Volume EF:                                 3D EF:        60 %                                 LV EDV:       109 ml                                 LV ESV:       44 ml                                 LV SV:        66 ml  RIGHT VENTRICLE  RV S prime:     12.10 cm/s  TAPSE (M-mode): 1.9 cm   LEFT ATRIUM             Index        RIGHT ATRIUM           Index  LA diam:        3.50 cm 1.99 cm/m   RA Area:     15.90 cm  LA Vol (A2C):   41.0 ml 23.29 ml/m  RA Volume:   42.30 ml  24.03 ml/m  LA Vol (A4C):   53.1 ml 30.17 ml/m  LA Biplane Vol: 46.5 ml  26.42 ml/m   AORTIC VALVE  AV Area (Vmax):    0.63 cm  AV Area (Vmean):   0.62 cm  AV Area (VTI):     0.52 cm  AV Vmax:           380.00 cm/s  AV Vmean:          266.000 cm/s  AV VTI:            0.874 m  AV Peak Grad:      57.8 mmHg  AV Mean Grad:      32.0 mmHg  LVOT Vmax:         76.35 cm/s  LVOT Vmean:        52.450 cm/s  LVOT VTI:          0.145 m  LVOT/AV VTI ratio: 0.17  AI PHT:            449 msec     AORTA  Ao Root diam: 3.60 cm  Ao Asc diam:  3.50 cm   MITRAL VALVE  MV Area (PHT): cm         SHUNTS  MV Decel Time: 152 msec    Systemic VTI:  0.14 m  MV E velocity: 71.50 cm/s  Systemic Diam: 2.00 cm  MV A velocity: 88.45 cm/s  MV E/A ratio:  0.81   Fransico Him MD  Electronically signed by Fransico Him MD  Signature Date/Time: 08/16/2021/3:26:39 PM         Final     Physicians  Panel Physicians Referring Physician Case Authorizing Physician  Burnell Blanks, MD (Primary)     Procedures  RIGHT/LEFT HEART CATH AND CORONARY ANGIOGRAPHY   Conclusion      Mid LAD lesion is 40% stenosed.   1st Mrg lesion is 20% stenosed.   Hemodynamic findings consistent with mild pulmonary hypertension.   Mild non-obstructive CAD Mild pulmonary HTN Severe aortic stenosis by echo (Cath data: mean gradient 27.3 mmHg, peak to peak gradient 32 mmHg, AVA 0.66 cm2).    Recommendations: Will continue planning for TAVR   Indications  Severe aortic stenosis [I35.0 (ICD-10-CM)]   Procedural Details  Technical Details Indication: Severe aortic stenosis  Procedure: The risks, benefits, complications, treatment options, and expected outcomes were discussed with the patient. The patient and/or family concurred with the proposed plan, giving informed consent. The patient was brought to the cath lab after IV hydration was given. The patient was sedated with Versed and Fentanyl. The IV catheter in the right antecubital vein was changed for a 5 [place] sheath. Right heart  catheterization performed with a balloon tipped catheter. The right wrist was prepped and draped in a sterile fashion. 1% lidocaine was used for local anesthesia. Using the modified Seldinger access technique, a 5 French sheath was placed in the right  radial artery. 3 mg Verapamil was given through the sheath. 4500 units IV heparin was given. Standard diagnostic catheters were used to perform selective coronary angiography. I crossed the aortic valve with an AL-1 catheter and the J wire. The sheath was removed from the right radial artery and a Terumo hemostasis band was applied at the arteriotomy site on the right wrist.      Estimated blood loss <50 mL.   During this procedure medications were administered to achieve and maintain moderate conscious sedation while the patient's heart rate, blood pressure, and oxygen saturation were continuously monitored and I was present face-to-face 100% of this time.   Medications (Filter: Administrations occurring from 0826 to 0919 on 09/17/21) midazolam (VERSED) injection (mg) Total dose:  1 mg Date/Time Rate/Dose/Volume Action   09/17/21 0843 1 mg Given    fentaNYL (SUBLIMAZE) injection (mcg) Total dose:  25 mcg Date/Time Rate/Dose/Volume Action   09/17/21 0843 25 mcg Given    lidocaine (PF) (XYLOCAINE) 1 % injection (mL) Total volume:  4 mL Date/Time Rate/Dose/Volume Action   09/17/21 0849 2 mL Given   0852 2 mL Given    Radial Cocktail/Verapamil only (mL) Total volume:  10 mL Date/Time Rate/Dose/Volume Action   09/17/21 0852 10 mL Given    heparin sodium (porcine) injection (Units) Total dose:  4,500 Units Date/Time Rate/Dose/Volume Action   09/17/21 0858 4,500 Units Given    iohexol (OMNIPAQUE) 350 MG/ML injection (mL) Total volume:  45 mL Date/Time Rate/Dose/Volume Action   09/17/21 0916 45 mL Given    Heparin (Porcine) in NaCl 1000-0.9 UT/500ML-% SOLN (mL) Total volume:  1,000 mL Date/Time Rate/Dose/Volume Action   09/17/21  0916 500 mL Given   0916 500 mL Given    Sedation Time  Sedation Time Physician-1: 24 minutes 36 seconds Contrast  Medication Name Total Dose  iohexol (OMNIPAQUE) 350 MG/ML injection 45 mL   Radiation/Fluoro  Fluoro time: 6.3 (min) DAP: 13163 (mGycm2) Cumulative Air Kerma: 229 (mGy) Coronary Findings  Diagnostic Dominance: Right Left Anterior Descending  Vessel is large.  Mid LAD lesion is 40% stenosed.    Left Circumflex  Vessel is large.    First Obtuse Marginal Branch  Vessel is moderate in size.  1st Mrg lesion is 20% stenosed.    Intervention   No interventions have been documented.   Right Heart  Right Heart Pressures Hemodynamic findings consistent with mild pulmonary hypertension. Elevated LV EDP consistent with volume overload.   Coronary Diagrams  Diagnostic Dominance: Right Intervention  Implants     No implant documentation for this case.   Syngo Images   Show images for CARDIAC CATHETERIZATION Images on Long Term Storage   Show images for Shamila, Lerch to Procedure Log  Procedure Log    Hemo Data  Flowsheet Row Most Recent Value  Fick Cardiac Output 4.85 L/min  Fick Cardiac Output Index 2.62 (L/min)/BSA  Aortic Mean Gradient 27.3 mmHg  Aortic Peak Gradient 32 mmHg  Aortic Valve Area 0.66  Aortic Value Area Index 0.36 cm2/BSA  RA A Wave 15 mmHg  RA V Wave 14 mmHg  RA Mean 13 mmHg  RV Systolic Pressure 40 mmHg  RV Diastolic Pressure 10 mmHg  RV EDP 15 mmHg  PA Systolic Pressure 46 mmHg  PA Diastolic Pressure 24 mmHg  PA Mean 34 mmHg  PW A Wave 24 mmHg  PW V Wave 33 mmHg  PW Mean 24 mmHg  AO Systolic Pressure 861 mmHg  AO Diastolic Pressure 49  mmHg  AO Mean 72 mmHg  LV Systolic Pressure 462 mmHg  LV Diastolic Pressure 16 mmHg  LV EDP 23 mmHg  AOp Systolic Pressure 703 mmHg  AOp Diastolic Pressure 71 mmHg  AOp Mean Pressure 500 mmHg  LVp Systolic Pressure 938 mmHg  LVp Diastolic Pressure 20 mmHg  LVp EDP Pressure  25 mmHg  QP/QS 1  TPVR Index 12.94 HRUI  TSVR Index 27.4 HRUI  PVR SVR Ratio 0.17  TPVR/TSVR Ratio 0.47    ADDENDUM REPORT: 09/27/2021 18:12   CLINICAL DATA:  Aortic Valve pathology with assessment for TAVR   EXAM: Cardiac TAVR CT   TECHNIQUE: The patient was scanned on a Siemens Force 182 slice scanner. A 120 kV retrospective scan was triggered in the descending thoracic aorta at 111 HU's. Gantry rotation speed was 270 msecs and collimation was .9 mm. No beta blockade or nitro were given. The 3D data set was reconstructed in 5% intervals of the R-R cycle. Systolic and diastolic phases were analyzed on a dedicated work station using MPR, MIP and VRT modes. The patient received 100 cc of contrast.   FINDINGS: Aortic Valve: Severely thickened aortic valve with heavy calcification and reduced excursion the planimeter valve area is 0.99 Sq cm consistent with severe aortic stenosis.   Number of leaflets: This valve is a functionally bicuspid with L-R fusion and small raphe.   Annular calcification: Moderate- there is a single calcified nodule but with protrusion into the annular lumen.   Aortic Valve Calcium Score: 2874   Presence of significant basal septal hypertrophy: no   Perimembranous septal diameter: 8 mm   Aortic Annulus Measurements- 10% phase   Major annulus diameter: 25 mm   Minor annulus diameter:22 mm   Annular perimeter: 74 mm   Annular area: 4.2 cm2   Aortic Root Measurements   Sinotubular Junction: 29 mm   Ascending Thoracic Aorta: 37 mm   Aortic Arch: 28 mm   Descending Thoracic Aorta: 26 mm   Sinus of Valsalva Measurements:   Right coronary cusp width: 27 mm   Left coronary cusp width: 27 mm   Non coronary cusp width: 33 mm   Mean diameter: 31 mm   There is notable sinus asymmetry   Coronary Artery Height above Annulus:   Left Main: 12 mm   Left SoV height: 17 mm   Right Coronary: 12 mm   Right SoV height: 17 mm   Optimum  Fluoroscopic Angle for Delivery: LAO 4, CAU 2   Valves for structural team consideration: 23 mm Edwards Sapien Valve   Non TAVR Valve Findings:   Coronary Arteries: Normal coronary origins.   Coronary Calcium Score:   Left main: 0   Left anterior descending artery: 80   Left circumflex artery: 13   Right coronary artery: 75   Total: 168   Percentile: 61st for age, sex, and race matched control.   Normal variant pulmonary veins- presence of a right middle pulmonary vein.   No left atrial appendage thrombus.   IMPRESSION: 1. Severe Aortic stenosis. Findings pertinent to TAVR procedure are detailed above. Functional bicuspid valve with sinus asymmetry   2. Patient's total coronary artery calcium score is 168, which is 61st percentile for subjects of the same age, gender, and race based populations.   RECOMMENDATIONS:   Coronary artery calcium (CAC) score is a strong predictor of incident coronary heart disease (CHD) and provides predictive information beyond traditional risk factors. CAC scoring is reasonable to use in the  decision to withhold, postpone, or initiate statin therapy in intermediate-risk or selected borderline-risk asymptomatic adults (age 77-75 years and LDL-C >=70 to <190 mg/dL) who do not have diabetes or established atherosclerotic cardiovascular disease (ASCVD).* In intermediate-risk (10-year ASCVD risk >=7.5% to <20%) adults or selected borderline-risk (10-year ASCVD risk >=5% to <7.5%) adults in whom a CAC score is measured for the purpose of making a treatment decision the following recommendations have been made:   If CAC = 0, it is reasonable to withhold statin therapy and reassess in 5 to 10 years, as long as higher risk conditions are absent (diabetes mellitus, family history of premature CHD in first degree relatives (males <55 years; females <65 years), cigarette smoking, LDL >=190 mg/dL or other independent risk factors).   If CAC is 1  to 99, it is reasonable to initiate statin therapy for patients >=82 years of age.   If CAC is >=100 or >=75th percentile, it is reasonable to initiate statin therapy at any age.   Cardiology referral should be considered for patients with CAC scores >=400 or >=75th percentile.   *2018 AHA/ACC/AACVPR/AAPA/ABC/ACPM/ADA/AGS/APhA/ASPC/NLA/PCNA Guideline on the Management of Blood Cholesterol: A Report of the American College of Cardiology/American Heart Association Task Force on Clinical Practice Guidelines. J Am Coll Cardiol. 2019;73(24):3168-3209.   Mahesh  Chandrasekhar     Electronically Signed   By: Rudean Haskell M.D.   On: 09/27/2021 18:12    Addended by Werner Lean, MD on 09/27/2021  6:14 PM   Study Result  Narrative & Impression  EXAM: OVER-READ INTERPRETATION  CT CHEST   The following report is an over-read performed by radiologist Dr. Salvatore Marvel of [facility], [place] on 09/26/2021. This over-read does not include interpretation of cardiac or coronary anatomy or pathology. The coronary CTA interpretation by the cardiologist is attached.   COMPARISON:  None.   FINDINGS: Please see the separate concurrent chest CT angiogram report for details.   IMPRESSION: Please see the separate concurrent chest CT angiogram report for details.   Electronically Signed: By: Ilona Sorrel M.D. On: 09/26/2021 12:58      Narrative & Impression  CLINICAL DATA:  Aortic valve replacement (TAVR), pre-op eval. Aortic valve stenosis.   EXAM: CT ANGIOGRAPHY CHEST, ABDOMEN AND PELVIS   TECHNIQUE: Multidetector CT imaging through the chest, abdomen and pelvis was performed using the standard protocol during bolus administration of intravenous contrast. Multiplanar reconstructed images and MIPs were obtained and reviewed to evaluate the vascular anatomy.   CONTRAST:  161mL OMNIPAQUE IOHEXOL 350 MG/ML SOLN   COMPARISON:  None.   FINDINGS: CTA  CHEST FINDINGS   Cardiovascular: Mild cardiomegaly. Diffuse thickening and coarse calcification of the aortic valve. No significant pericardial effusion/thickening. Left anterior descending and right coronary atherosclerosis. Atherosclerotic nonaneurysmal thoracic aorta. Normal caliber pulmonary arteries. No central pulmonary emboli.   Mediastinum/Nodes: No discrete thyroid nodules. Unremarkable esophagus. No pathologically enlarged axillary, mediastinal or hilar lymph nodes.   Lungs/Pleura: No pneumothorax. No pleural effusion. No acute consolidative airspace disease or lung masses. Solid 0.2 cm peripheral right lower lobe pulmonary nodule (series 7/image 63). No additional significant pulmonary nodules.   Musculoskeletal: No aggressive appearing focal osseous lesions. Moderate thoracic spondylosis.   CTA ABDOMEN AND PELVIS FINDINGS   Hepatobiliary: Normal liver with no liver mass. Normal gallbladder with no radiopaque cholelithiasis. No biliary ductal dilatation.   Pancreas: Normal, with no mass or duct dilation.   Spleen: Normal size. No mass.   Adrenals/Urinary Tract: No discrete adrenal nodules.  Subcentimeter hypodense medial upper left renal cortical lesion, too small to characterize, requiring no follow-up. No additional contour deforming renal lesions. No hydronephrosis. Normal bladder.   Stomach/Bowel: Normal non-distended stomach. Normal caliber small bowel with no small bowel wall thickening. Normal appendix. Normal large bowel with no diverticulosis, large bowel wall thickening or pericolonic fat stranding.   Vascular/Lymphatic: Mildly atherosclerotic nonaneurysmal abdominal aorta. No pathologically enlarged lymph nodes in the abdomen or pelvis.   Reproductive: Status post hysterectomy. Simple appearing 1.4 cm left adnexal cyst abutting the left vaginal cuff (series 6/image 203). No right adnexal mass.   Other: No pneumoperitoneum, ascites or focal fluid  collection.   Musculoskeletal: No aggressive appearing focal osseous lesions. Marked multilevel lumbar degenerative disc disease.   VASCULAR MEASUREMENTS PERTINENT TO TAVR:   AORTA:   Minimal Aortic Diameter-13.5 x 13.3 mm   Severity of Aortic Calcification-mild   RIGHT PELVIS:   Right Common Iliac Artery -   Minimal Diameter-9.5 x 9.3 mm   Tortuosity-mild   Calcification-mild   Right External Iliac Artery -   Minimal Diameter-6.7 x 6.6 mm   Tortuosity-moderate to severe   Calcification-mild   Right Common Femoral Artery -   Minimal Diameter-7.4 x 7.2 mm   Tortuosity-mild   Calcification-none   LEFT PELVIS:   Left Common Iliac Artery -   Minimal Diameter-9.4 x 7.7 mm   Tortuosity-mild-to-moderate   Calcification-mild   Left External Iliac Artery -   Minimal Diameter-6.7 x 6.7 mm   Tortuosity-moderate   Calcification-none   Left Common Femoral Artery -   Minimal Diameter-7.1 x 6.6 mm   Tortuosity-mild   Calcification-mild   Review of the MIP images confirms the above findings.   IMPRESSION: 1. Vascular findings and measurements pertinent to potential TAVR procedure, as detailed. 2. Diffuse thickening and coarse calcification of the aortic valve, compatible with reported aortic stenosis. 3. Mild cardiomegaly. Two-vessel coronary atherosclerosis. 4. Tiny 0.2 cm solid right lower lobe pulmonary nodule. No follow-up needed if patient is low-risk. Non-contrast chest CT can be considered in 12 months if patient is high-risk. This recommendation follows the consensus statement: Guidelines for Management of Incidental Pulmonary Nodules Detected on CT Images: From the Fleischner Society 2017; Radiology 2017; 284:228-243. 5. Simple appearing 1.4 cm left adnexal cyst abutting the left vaginal cuff. No follow-up imaging recommended. Note: This recommendation does not apply to premenarchal patients and to those with increased risk (genetic, family  history, elevated tumor markers or other high-risk factors) of ovarian cancer. Reference: JACR 2020 Feb; 17(2):248-254 6. Aortic Atherosclerosis (ICD10-I70.0).     Electronically Signed   By: Ilona Sorrel M.D.   On: 09/26/2021 13:26     STS Risk Score: Risk of Mortality:  3.047%  Renal Failure:  3.919%  Permanent Stroke:  1.721%  Prolonged Ventilation:  8.707%  DSW Infection:  0.189%  Reoperation:  2.823%  Morbidity or Mortality:  12.507%  Short Length of Stay:  21.426%  Long Length of Stay:  8.286%   Impression:  This 81 year old woman has stage D, severe, symptomatic aortic stenosis with New York Heart Association class II symptoms of exertional fatigue and shortness of breath consistent with chronic diastolic congestive heart failure.  I have personally reviewed her 2D echocardiogram, cardiac catheterization, and CTA studies.  Echocardiogram shows a severely calcified and thickened aortic valve with restricted leaflet mobility.  The mean gradient was 32 mmHg with a peak gradient of 58 mmHg and a valve area of 0.52 cm.  Stroke-volume index is low  at 63.  There is mild to moderate aortic insufficiency.  Left ventricular ejection fraction is normal.  Cardiac catheterization showed mild nonobstructive coronary disease.  There is mild pulmonary hypertension with a PA pressure of 46/24 and a mean wedge pressure of 24.  I agree that aortic valve replacement is the best treatment for this patient to improve her symptoms and prevent progressive left ventricular deterioration.  Given her age and comorbid risk factors I think that transcatheter aortic valve replacement would be the best option for her.  Her gated cardiac CTA shows anatomy suitable for TAVR using a SAPIEN 3 valve.  Her abdominal and pelvic CTA shows adequate pelvic vascular anatomy to allow transfemoral insertion.  The patient was counseled at length regarding treatment alternatives for management of severe symptomatic  aortic stenosis. The risks and benefits of surgical intervention has been discussed in detail. Long-term prognosis with medical therapy was discussed. Alternative approaches such as conventional surgical aortic valve replacement, transcatheter aortic valve replacement, and palliative medical therapy were compared and contrasted at length. This discussion was placed in the context of the patient's own specific clinical presentation and past medical history. All of her questions have been addressed.   Following the decision to proceed with transcatheter aortic valve replacement, a discussion was held regarding what types of management strategies would be attempted intraoperatively in the event of life-threatening complications, including whether or not the patient would be considered a candidate for the use of cardiopulmonary bypass and/or conversion to open sternotomy for attempted surgical intervention.  She is 81 years old but I think she would be a candidate for emergent sternotomy to manage any intraoperative complications.  The patient is aware of the fact that transient use of cardiopulmonary bypass may be necessary. The patient has been advised of a variety of complications that might develop including but not limited to risks of death, stroke, paravalvular leak, aortic dissection or other major vascular complications, aortic annulus rupture, device embolization, cardiac rupture or perforation, mitral regurgitation, acute myocardial infarction, arrhythmia, heart block or bradycardia requiring permanent pacemaker placement, congestive heart failure, respiratory failure, renal failure, pneumonia, infection, other late complications related to structural valve deterioration or migration, or other complications that might ultimately cause a temporary or permanent loss of functional independence or other long term morbidity. The patient provides full informed consent for the procedure as described and all  questions were answered.      Plan:  She will be scheduled for transfemoral TAVR using a SAPIEN 3 valve on 10/30/2021.  I spent 60 minutes performing this consultation and > 50% of this time was spent face to face counseling and coordinating the care of this patient's severe symptomatic aortic stenosis   Gaye Pollack, MD 10/17/2021 4:03 PM

## 2021-10-23 ENCOUNTER — Other Ambulatory Visit: Payer: Self-pay | Admitting: Internal Medicine

## 2021-10-24 ENCOUNTER — Other Ambulatory Visit: Payer: Self-pay

## 2021-10-24 DIAGNOSIS — I350 Nonrheumatic aortic (valve) stenosis: Secondary | ICD-10-CM

## 2021-10-25 NOTE — Pre-Procedure Instructions (Signed)
Surgical Instructions    Your procedure is scheduled on Tuesday, January 24th.  Report to [facility] Main Entrance "A" at 10:15 A.M., then check in with the Admitting office.  Call this number if you have problems the morning of surgery:  [phone]   If you have any questions prior to your surgery date call [phone]: Open Monday-Friday 8am-4pm    Remember:  Do not eat or drink after midnight the night before your surgery  As of today, STOP now taking any Aspirin (unless otherwise instructed by your surgeon), Aleve, Naproxen, Ibuprofen, Motrin, Advil, Goody's, BC's, all herbal medications, fish oil, and all non-prescription vitamins.   Stop taking Eliquis on Thursday, 1/19. You will take your last dose on 1/18.   Continue taking all other medications without change through the day before surgery.   On the morning of surgery do not take any medications.                     Do NOT Smoke (Tobacco/Vaping) for 24 hours prior to your procedure.  If you use a CPAP at night, you may bring all equipment for your overnight stay.   Contacts, glasses, piercing's, hearing aid's, dentures or partials may not be worn into surgery, please bring cases for these belongings.    For patients admitted to the hospital, discharge time will be determined by your treatment team.   Patients discharged the day of surgery will not be allowed to drive home, and someone needs to stay with them for 24 hours.  NO VISITORS WILL BE ALLOWED IN PRE-OP WHERE PATIENTS ARE PREPPED FOR SURGERY.  ONLY 1 SUPPORT PERSON MAY BE PRESENT IN THE WAITING ROOM WHILE YOU ARE IN SURGERY.  IF YOU ARE TO BE ADMITTED, ONCE YOU ARE IN YOUR ROOM YOU WILL BE ALLOWED TWO (2) VISITORS. (1) VISITOR MAY STAY OVERNIGHT BUT MUST ARRIVE TO THE ROOM BY 8pm.  Minor children may have two parents present. Special consideration for safety and communication needs will be reviewed on a case by case basis.   Special instructions:   [place]-  Preparing For Surgery  Before surgery, you can play an important role. Because skin is not sterile, your skin needs to be as free of germs as possible. You can reduce the number of germs on your skin by washing with CHG (chlorahexidine gluconate) Soap before surgery.  CHG is an antiseptic cleaner which kills germs and bonds with the skin to continue killing germs even after washing.    Oral Hygiene is also important to reduce your risk of infection.  Remember - BRUSH YOUR TEETH THE MORNING OF SURGERY WITH YOUR REGULAR TOOTHPASTE  Please do not use if you have an allergy to CHG or antibacterial soaps. If your skin becomes reddened/irritated stop using the CHG.  Do not shave (including legs and underarms) for at least 48 hours prior to first CHG shower. It is OK to shave your face.  Please follow these instructions carefully.   Shower the NIGHT BEFORE SURGERY and the MORNING OF SURGERY  If you chose to wash your hair, wash your hair first as usual with your normal shampoo.  After you shampoo, rinse your hair and body thoroughly to remove the shampoo.  Use CHG Soap as you would any other liquid soap. You can apply CHG directly to the skin and wash gently with a scrungie or a clean washcloth.   Apply the CHG Soap to your body ONLY FROM THE NECK DOWN.  Do not use on open wounds or open sores. Avoid contact with your eyes, ears, mouth and genitals (private parts). Wash Face and genitals (private parts)  with your normal soap.   Wash thoroughly, paying special attention to the area where your surgery will be performed.  Thoroughly rinse your body with warm water from the neck down.  DO NOT shower/wash with your normal soap after using and rinsing off the CHG Soap.  Pat yourself dry with a CLEAN TOWEL.  Wear CLEAN PAJAMAS to bed the night before surgery  Place CLEAN SHEETS on your bed the night before your surgery  DO NOT SLEEP WITH PETS.   Day of Surgery: Shower with CHG soap. Do not  wear jewelry, make up, nail polish, gel polish, artificial nails, or any other type of covering on natural nails including finger and toenails. If patients have artificial nails, gel coating, etc. that need to be removed by a nail salon please have this removed prior to surgery. Surgery may need to be canceled/delayed if the surgeon/anesthesiologist feels like the patient is unable to be adequately monitored. Do not wear lotions, powders, perfumes, or deodorant. Do not shave 48 hours prior to surgery.  Do not bring valuables to the hospital. [facility] is not responsible for any belongings or valuables. Wear Clean/Comfortable clothing the morning of surgery Remember to brush your teeth WITH YOUR REGULAR TOOTHPASTE.   Please read over the following fact sheets that you were given.   3 days prior to your procedure or After your COVID test   You are not required to quarantine however you are required to wear a well-fitting mask when you are out and around people not in your household. If your mask becomes wet or soiled, replace with a new one.   Wash your hands often with soap and water for 20 seconds or clean your hands with an alcohol-based hand sanitizer that contains at least 60% alcohol.   Do not share personal items.   Notify your provider:  o if you are in close contact with someone who has COVID  o or if you develop a fever of 100.4 or greater, sneezing, cough, sore throat, shortness of breath or body aches.

## 2021-10-26 ENCOUNTER — Encounter (HOSPITAL_COMMUNITY)
Admission: RE | Admit: 2021-10-26 | Discharge: 2021-10-26 | Disposition: A | Discharge status: Home / Self Care | Payer: Medicare PPO | Source: Ambulatory Visit | Attending: Cardiovascular Disease | Admitting: Cardiovascular Disease

## 2021-10-26 ENCOUNTER — Other Ambulatory Visit: Payer: Self-pay

## 2021-10-26 ENCOUNTER — Ambulatory Visit (HOSPITAL_COMMUNITY)
Admission: RE | Admit: 2021-10-26 | Discharge: 2021-10-26 | Disposition: A | Discharge status: Home / Self Care | Payer: Medicare PPO | Source: Ambulatory Visit | Attending: Cardiovascular Disease | Admitting: Cardiovascular Disease

## 2021-10-26 ENCOUNTER — Encounter (HOSPITAL_COMMUNITY): Payer: Self-pay

## 2021-10-26 VITALS — BP 115/56 | HR 72 | Temp 98.2°F | Resp 18 | Ht 64.0 in | Wt 186.4 lb

## 2021-10-26 DIAGNOSIS — E119 Type 2 diabetes mellitus without complications: Secondary | ICD-10-CM | POA: Diagnosis present

## 2021-10-26 DIAGNOSIS — Z01818 Encounter for other preprocedural examination: Secondary | ICD-10-CM

## 2021-10-26 DIAGNOSIS — I350 Nonrheumatic aortic (valve) stenosis: Secondary | ICD-10-CM | POA: Insufficient documentation

## 2021-10-26 LAB — URINALYSIS, ROUTINE W REFLEX MICROSCOPIC
Bilirubin Urine: NEGATIVE
Glucose, UA: NEGATIVE mg/dL
Hgb urine dipstick: NEGATIVE
Ketones, ur: NEGATIVE mg/dL
Leukocytes,Ua: NEGATIVE
Nitrite: NEGATIVE
Protein, ur: NEGATIVE mg/dL
Specific Gravity, Urine: 1.004 — ABNORMAL LOW (ref 1.005–1.030)
pH: 7 (ref 5.0–8.0)

## 2021-10-26 LAB — CBC
HCT: 42.2 % (ref 36.0–46.0)
Hemoglobin: 14.3 g/dL (ref 12.0–15.0)
MCH: 30.6 pg (ref 26.0–34.0)
MCHC: 33.9 g/dL (ref 30.0–36.0)
MCV: 90.2 fL (ref 80.0–100.0)
Platelets: 278 10*3/uL (ref 150–400)
RBC: 4.68 MIL/uL (ref 3.87–5.11)
RDW: 12.6 % (ref 11.5–15.5)
WBC: 5.7 10*3/uL (ref 4.0–10.5)
nRBC: 0 % (ref 0.0–0.2)

## 2021-10-26 LAB — COMPREHENSIVE METABOLIC PANEL
ALT: 23 U/L (ref 0–44)
AST: 26 U/L (ref 15–41)
Albumin: 4 g/dL (ref 3.5–5.0)
Alkaline Phosphatase: 72 U/L (ref 38–126)
Anion gap: 11 (ref 5–15)
BUN: 26 mg/dL — ABNORMAL HIGH (ref 8–23)
CO2: 26 mmol/L (ref 22–32)
Calcium: 9.9 mg/dL (ref 8.9–10.3)
Chloride: 101 mmol/L (ref 98–111)
Creatinine, Ser: 0.92 mg/dL (ref 0.44–1.00)
GFR, Estimated: >60 mL/min (ref >60)
Glucose, Bld: 106 mg/dL — ABNORMAL HIGH (ref 70–99)
Potassium: 4.1 mmol/L (ref 3.5–5.1)
Sodium: 138 mmol/L (ref 135–145)
Total Bilirubin: 0.5 mg/dL (ref 0.3–1.2)
Total Protein: 7.2 g/dL (ref 6.5–8.1)

## 2021-10-26 LAB — GLUCOSE, CAPILLARY: Glucose-Capillary: 140 mg/dL — ABNORMAL HIGH (ref 70–99)

## 2021-10-26 LAB — BLOOD GAS, ARTERIAL
Acid-Base Excess: 5.1 mmol/L — ABNORMAL HIGH (ref 0.0–2.0)
Bicarbonate: 29.1 mmol/L — ABNORMAL HIGH (ref 20.0–28.0)
Drawn by: 58793
FIO2: 21
O2 Saturation: 97.7 %
Patient temperature: 37
pCO2 arterial: 43.3 mmHg (ref 32.0–48.0)
pH, Arterial: 7.443 (ref 7.350–7.450)
pO2, Arterial: 96.8 mmHg (ref 83.0–108.0)

## 2021-10-26 LAB — TYPE AND SCREEN
ABO/RH(D): A NEG
Antibody Screen: NEGATIVE

## 2021-10-26 LAB — SURGICAL PCR SCREEN
MRSA, PCR: NEGATIVE
Staphylococcus aureus: POSITIVE — AB

## 2021-10-26 LAB — PROTIME-INR
INR: 1 (ref 0.8–1.2)
Prothrombin Time: 12.7 seconds (ref 11.4–15.2)

## 2021-10-26 LAB — BRAIN NATRIURETIC PEPTIDE: B Natriuretic Peptide: 123.6 pg/mL — ABNORMAL HIGH (ref 0.0–100.0)

## 2021-10-26 NOTE — Progress Notes (Signed)
PCP - Dr. Anastasia Pall Cardiologist - Dr. Lyman Bishop  PPM/ICD - n/a [employer] loop recorder implanted. No orders needed  Chest x-ray - 10/26/21 EKG - 10/26/21 Stress Test - 2013 ECHO - 08/16/21 Cardiac Cath -09/17/21   Sleep Study - OSA+ CPAP - uses nightly   Fasting Blood Sugar - 120-135 Checks Blood Sugar 2 times a day CBG at PAT 140. Will collect A1C today.  Blood Thinner Instructions: Eliquis; LD 10/24/21 Aspirin Instructions: n/a  NPO at MD  COVID TEST- 10/26/21; done in PAT.  Anesthesia review: Yes, cardiac history.  Patient denies shortness of breath, fever, cough and chest pain at PAT appointment   All instructions explained to the patient, with a verbal understanding of the material. Patient agrees to go over the instructions while at home for a better understanding. Patient also instructed to self quarantine after being tested for COVID-19. The opportunity to ask questions was provided.

## 2021-10-29 MED ORDER — HEPARIN 30,000 UNITS/1000 ML (OHS) CELLSAVER SOLUTION
Status: DC
  Filled 2021-10-29 (×2): qty 1000

## 2021-10-29 MED ORDER — POTASSIUM CHLORIDE 2 MEQ/ML IV SOLN
80.0000 meq | INTRAVENOUS | Status: DC
  Filled 2021-10-29 (×2): qty 40

## 2021-10-29 MED ORDER — CEFAZOLIN SODIUM-DEXTROSE 2-4 GM/100ML-% IV SOLN
2.0000 g | INTRAVENOUS | Status: AC
  Administered 2021-10-30: 14:00:00 2 g via INTRAVENOUS
  Filled 2021-10-29 (×2): qty 100

## 2021-10-29 MED ORDER — MAGNESIUM SULFATE 50 % IJ SOLN
40.0000 meq | INTRAMUSCULAR | Status: DC
  Filled 2021-10-29 (×2): qty 9.85

## 2021-10-29 MED ORDER — NOREPINEPHRINE 4 MG/250ML-% IV SOLN
0.0000 ug/min | INTRAVENOUS | Status: DC
  Filled 2021-10-29: qty 250

## 2021-10-29 MED ORDER — DEXMEDETOMIDINE HCL IN NACL 400 MCG/100ML IV SOLN
0.1000 ug/kg/h | INTRAVENOUS | Status: AC
  Administered 2021-10-30: 14:00:00 1 ug/kg/h via INTRAVENOUS
  Administered 2021-10-30: 13:00:00 84 ug via INTRAVENOUS
  Filled 2021-10-29 (×2): qty 100

## 2021-10-29 NOTE — Progress Notes (Signed)
Per lab, pt's covid test will need to be recollected on DOS.  Jacqlyn Larsen, RN

## 2021-10-29 NOTE — H&P (Signed)
[address]       [place] [address]             [phone]      Cardiothoracic Surgery Admission History and Physical   PCP is Chesley Noon, MD Referring Provider is Lauree Chandler, MD Primary Cardiologist is [name], MD   Reason for admission:  Severe aortic stenosis   HPI:   The patient is a 81 year old woman with a history of persistent atrial fibrillation on Eliquis, hypertension, hyperlipidemia, hypothyroidism, type 2 diabetes, stage III chronic kidney disease, OSA on CPAP, depression, and aortic stenosis who presents with progressive exertional fatigue and shortness of breath as well as pressure across her chest.  She reports dizziness but no syncope.  She has had some lower extremity edema managed with Lasix.  An echocardiogram on 08/16/2021 showed a mean gradient across aortic valve of 32 mmHg with a valve area of 0.52 cm by VTI.  There is mild to moderate aortic insufficiency with a pressure half-time of 449 ms.  There is severe calcification and thickening of the aortic valve with restricted mobility.  Left ventricular ejection fraction was 60 to 65% with mild LVH and grade 1 diastolic dysfunction.   She is retired from [employer] and lives alone since her husband died.  She has a son who lives close to her.  She said that her main issue recently has been depression.  She was previously on Wellbutrin but was weaned off of that and started on Prozac by her primary physician.  She said that she stopped the Prozac recently because she did not feel it was helping her and she feels out of sorts now.       Past Medical History:  Diagnosis Date   Anxiety     CHF (congestive heart failure) (HCC)     Chronic lower back pain     Chronic systolic dysfunction of left ventricle     Depression     Dysrhythmia      A-Fib   Fatigue     GERD (gastroesophageal reflux disease)     High cholesterol      Hypertension     Hypothyroidism     Melanoma of shoulder, left (HCC)     Mild aortic stenosis     Nonischemic cardiomyopathy (HCC)     OSA on CPAP      reports compliance with CPAP   Osteoarthritis     Persistent atrial fibrillation with RVR ([place])      [name] 11/15/2016   PONV (postoperative nausea and vomiting)     RLS (restless legs syndrome)     S/P ablation of atrial flutter/ fib 01/10/17 01/11/2017   Type II diabetes mellitus ([place])      no medications           Past Surgical History:  Procedure Laterality Date   ATRIAL FIBRILLATION ABLATION N/A 01/10/2017    Procedure: Atrial Fibrillation Ablation;  Surgeon: Thompson Grayer, MD;  Location: [facility];  Service: Cardiovascular;  Laterality: N/A;   CARDIOVERSION N/A 11/19/2016    Procedure: CARDIOVERSION;  Surgeon: Lelon Perla, MD;  Location: [facility] ENDOSCOPY;  Service: Cardiovascular;  Laterality: N/A;   CARDIOVERSION N/A 12/11/2016    Procedure: CARDIOVERSION;  Surgeon: Dorothy Spark, MD;  Location: [facility];  Service: Cardiovascular;  Laterality: N/A;   CARDIOVERSION N/A 12/18/2016    Procedure: CARDIOVERSION;  Surgeon: Skeet Latch, MD;  Location: [facility] ENDOSCOPY;  Service: Cardiovascular;  Laterality: N/A;   CATARACT EXTRACTION W/ INTRAOCULAR LENS  IMPLANT, BILATERAL Bilateral     DILATION AND CURETTAGE OF UTERUS       implantable loop recorder placement   12/27/2019    Medtronic Reveal Linq model LNQ 11 (SN [id number]) implanted in office by Dr Clide Deutscher [facility]   05/13/2012    No ECG changes. EKG negative for ischemia. No significant ischemia demonstrated.   MELANOMA EXCISION Left ~ 09/1979    "back of my shoulder"   RIGHT/LEFT HEART CATH AND CORONARY ANGIOGRAPHY N/A 09/17/2021    Procedure: RIGHT/LEFT HEART CATH AND CORONARY ANGIOGRAPHY;  Surgeon: Burnell Blanks, MD;  Location: [facility];  Service: Cardiovascular;  Laterality: N/A;   TEE WITHOUT CARDIOVERSION N/A 11/19/2016    Procedure:  TRANSESOPHAGEAL ECHOCARDIOGRAM (TEE);  Surgeon: Lelon Perla, MD;  Location: [facility] ENDOSCOPY;  Service: Cardiovascular;  Laterality: N/A;  need CV, but no anesthesia available   TEE WITHOUT CARDIOVERSION N/A 01/10/2017    Procedure: TRANSESOPHAGEAL ECHOCARDIOGRAM (TEE);  Surgeon: [name], MD;  Location: [facility] ENDOSCOPY;  Service: Cardiovascular;  Laterality: N/A;   TOTAL KNEE ARTHROPLASTY Right 07/28/2018    Procedure: RIGHT TOTAL KNEE ARTHROPLASTY;  Surgeon: Mcarthur Rossetti, MD;  Location: [facility];  Service: Orthopedics;  Laterality: Right;   TRANSTHORACIC ECHOCARDIOGRAM   02/18/2012    EF >55%, mild aortic stenosis   VAGINAL HYSTERECTOMY   1985           Family History  Problem Relation Age of Onset   Hypertension Mother     Diabetes Mother     Heart failure Father     Arrhythmia Sister     Hyperlipidemia Sister     Hypertension Sister     Healthy Brother     Stroke Paternal Grandmother     Cancer Paternal Grandfather     Healthy Daughter     Hypertension Son     Hyperlipidemia Son        Social History         Socioeconomic History   Marital status: Widowed      Spouse name: Not on file   Number of children: 2   Years of education: Not on file   Highest education level: Not on file  Occupational History   Occupation: [employer]  Tobacco Use   Smoking status: Never   Smokeless tobacco: Never  Vaping Use   Vaping Use: Never used  Substance and Sexual Activity   Alcohol use: No   Drug use: Never   Sexual activity: Never  Other Topics Concern   Not on file  Social History Narrative   Not on file    Social Determinants of Health    Financial Resource Strain: Not on file  Food Insecurity: Not on file  Transportation Needs: Not on file  Physical Activity: Not on file  Stress: Not on file  Social Connections: Not on file  Intimate Partner Violence: Not on file             Prior to Admission medications   Medication Sig Start  Date End Date Taking? Authorizing Provider  acetaminophen (TYLENOL) 500 MG tablet Take 1,000 mg by mouth every morning.     Yes [provider]  ALPRAZolam Duanne Moron) 0.5 MG tablet Take 0.5 mg by mouth 2 (two) times daily.  07/05/16   Yes [provider]  atorvastatin (LIPITOR) 20 MG tablet  TAKE 1 TABLET (20 MG TOTAL) BY MOUTH 3 (THREE) TIMES A WEEK. Patient taking differently: Take 20 mg by mouth every Monday, Wednesday, and Friday. 11/15/20   Yes Hilty, Nadean Corwin, MD  buPROPion (WELLBUTRIN XL) 150 MG 24 hr tablet Take 150 mg by mouth every 3 (three) days. 08/07/15   Yes [provider]  Calcium Carbonate-Vitamin D (CALCIUM + D PO) Take 1 tablet by mouth 2 (two) times daily.      Yes [provider]  carvedilol (COREG) 6.25 MG tablet Take 1 tablet (6.25 mg total) by mouth 2 (two) times daily with a meal. 07/16/21   Yes Allred, Jeneen Rinks, MD  diphenhydramine-acetaminophen (TYLENOL PM) 25-500 MG TABS tablet Take 2 tablets by mouth at bedtime.     Yes [provider]  ELIQUIS 5 MG TABS tablet TAKE 1 TABLET BY MOUTH TWICE A DAY 07/24/21   Yes Hilty, Nadean Corwin, MD  FLUoxetine (PROZAC) 10 MG capsule Take 10 mg by mouth daily.     Yes [provider]  furosemide (LASIX) 40 MG tablet Take 1 tablet (40 mg total) by mouth daily. 02/15/21   Yes Hilty, Nadean Corwin, MD  hypromellose (SYSTANE OVERNIGHT THERAPY) 0.3 % GEL ophthalmic ointment Place 2 application into both eyes at bedtime.     Yes [provider]  levothyroxine (SYNTHROID, LEVOTHROID) 88 MCG tablet Take 88 mcg by mouth daily before breakfast.     Yes [provider]  loratadine (CLARITIN) 10 MG tablet Take 10 mg by mouth daily.     Yes [provider]  Magnesium 500 MG TABS Take 500 mg by mouth daily.      Yes [provider]  Multiple Vitamins-Minerals (PRESERVISION AREDS 2) CAPS Take 1 capsule by mouth 2 (two) times daily.     Yes [provider]  omeprazole  (PRILOSEC) 40 MG capsule Take 40 mg by mouth every evening.     Yes [provider]  [facility] ULTRA test strip   02/08/20   Yes [provider]  Polyethyl Glycol-Propyl Glycol 0.4-0.3 % SOLN Place 2 drops into both eyes in the morning, at noon, and at bedtime.     Yes [provider]  pyridOXINE (B-6) 50 MG tablet Take 50 mg by mouth daily with supper.     Yes [provider]  rOPINIRole (REQUIP) 0.5 MG tablet Take 0.5 mg by mouth 4 (four) times daily.      Yes [provider]  spironolactone (ALDACTONE) 25 MG tablet Take 0.5 tablets (12.5 mg total) by mouth daily. 01/05/21   Yes Lendon Colonel, NP  zinc gluconate 50 MG tablet Take 50 mg by mouth daily.     Yes [provider]            Current Outpatient Medications  Medication Sig Dispense Refill   acetaminophen (TYLENOL) 500 MG tablet Take 1,000 mg by mouth every morning.       ALPRAZolam (XANAX) 0.5 MG tablet Take 0.5 mg by mouth 2 (two) times daily.        atorvastatin (LIPITOR) 20 MG tablet TAKE 1 TABLET (20 MG TOTAL) BY MOUTH 3 (THREE) TIMES A WEEK. (Patient taking differently: Take 20 mg by mouth every Monday, Wednesday, and Friday.) 36 tablet 3   buPROPion (WELLBUTRIN XL) 150 MG 24 hr tablet Take 150 mg by mouth every 3 (three) days.       Calcium Carbonate-Vitamin D (CALCIUM + D PO) Take 1 tablet by mouth 2 (  two) times daily.        carvedilol (COREG) 6.25 MG tablet Take 1 tablet (6.25 mg total) by mouth 2 (two) times daily with a meal. 180 tablet 3   diphenhydramine-acetaminophen (TYLENOL PM) 25-500 MG TABS tablet Take 2 tablets by mouth at bedtime.       ELIQUIS 5 MG TABS tablet TAKE 1 TABLET BY MOUTH TWICE A DAY 180 tablet 1   FLUoxetine (PROZAC) 10 MG capsule Take 10 mg by mouth daily.       furosemide (LASIX) 40 MG tablet Take 1 tablet (40 mg total) by mouth daily. 90 tablet 3   hypromellose (SYSTANE OVERNIGHT THERAPY) 0.3 % GEL ophthalmic ointment Place 2 application into  both eyes at bedtime.       levothyroxine (SYNTHROID, LEVOTHROID) 88 MCG tablet Take 88 mcg by mouth daily before breakfast.       loratadine (CLARITIN) 10 MG tablet Take 10 mg by mouth daily.       Magnesium 500 MG TABS Take 500 mg by mouth daily.        Multiple Vitamins-Minerals (PRESERVISION AREDS 2) CAPS Take 1 capsule by mouth 2 (two) times daily.       omeprazole (PRILOSEC) 40 MG capsule Take 40 mg by mouth every evening.       ONETOUCH ULTRA test strip         Polyethyl Glycol-Propyl Glycol 0.4-0.3 % SOLN Place 2 drops into both eyes in the morning, at noon, and at bedtime.       pyridOXINE (B-6) 50 MG tablet Take 50 mg by mouth daily with supper.       rOPINIRole (REQUIP) 0.5 MG tablet Take 0.5 mg by mouth 4 (four) times daily.        spironolactone (ALDACTONE) 25 MG tablet Take 0.5 tablets (12.5 mg total) by mouth daily. 45 tablet 3   zinc gluconate 50 MG tablet Take 50 mg by mouth daily.        No current facility-administered medications for this visit.           Allergies  Allergen Reactions   Amoxicillin Nausea Only      850 mg dosage    Crestor [Rosuvastatin] Other (See Comments)      Joints ached   Lovastatin Other (See Comments)      Joints hurt all over body           Review of Systems:               General:                      Normal appetite, + decreased  energy, no weight gain, no weight loss, no fever             Cardiac:                       + chest pain with exertion, + chest pain at rest, +SOB with mild exertion, no resting SOB, no PND, + orthopnea, no palpitations, no arrhythmia, + atrial fibrillation, + LE edema, + dizzy spells, no syncope             Respiratory:                 + shortness of breath, no home oxygen, no productive cough, no dry cough, no bronchitis, no wheezing, no hemoptysis, no asthma, no pain with inspiration or cough, + sleep apnea, +  CPAP at night             GI:                               no difficulty swallowing, + reflux, no  frequent heartburn, no hiatal hernia, no abdominal pain, + constipation, no diarrhea, no hematochezia, no hematemesis, no melena             GU:                              no dysuria,  no frequency, + urinary tract infection, no hematuria, no kidney stones, + kidney disease             Vascular:                     no pain suggestive of claudication, no pain in feet, no leg cramps, no varicose veins, no DVT, no non-healing foot ulcer             Neuro:                         no stroke, no TIA's, no seizures, no headaches, no temporary blindness one eye,  no slurred speech, + peripheral neuropathy, no chronic pain, no instability of gait, no memory/cognitive dysfunction             Musculoskeletal:         + arthritis, + joint swelling, no myalgias, no difficulty walking, normal mobility              Skin:                            no rash, no itching, no skin infections, no pressure sores or ulcerations             Psych:                         no anxiety, no depression, no nervousness, no unusual recent stress             Eyes:                           no blurry vision, no floaters, no recent vision changes, + wears glasses            ENT:                            no hearing loss, no loose or painful teeth, no dentures, last saw dentist Nov 2022             Hematologic:               no easy bruising, no abnormal bleeding, no clotting disorder, no frequent epistaxis             Endocrine:                   + diabetes, does  check CBG's at home                            Physical Exam:  BP 137/72 (BP Location: Right Arm, Patient Position: Sitting)    Pulse 69    Resp 20    Ht 5' (1.524 m)    Wt 189 lb (85.7 kg)    SpO2 96% Comment: RA   BMI 36.91 kg/m              General:                      Elderly but  well-appearing             HEENT:                       Unremarkable, NCAT, PERLA, EOMI             Neck:                           no JVD, no bruits, no adenopathy               Chest:                          clear to auscultation, symmetrical breath sounds, no wheezes, no rhonchi              CV:                              RRR, 3/6 systolic murmur RSB, no diastolic murmur             Abdomen:                    soft, non-tender, no masses              Extremities:                 warm, well-perfused, pulses palpable at ankle, no lower extremity edema             Rectal/GU                   Deferred             Neuro:                         Grossly non-focal and symmetrical throughout             Skin:                            Clean and dry, no rashes, no breakdown   Diagnostic Tests:   ECHOCARDIOGRAM REPORT         Patient Name:   Jenna [name] Date of Exam: 08/16/2021  Medical Rec #:  [id number]       Height:       61.0 in  Accession #:    [id number]      Weight:       169.4 lb  Date of Birth:  March 28, 1941       BSA:          1.760 m  Patient Age:    24 years        BP:           108/64 mmHg  Patient Gender: F  HR:           69 bpm.  Exam Location:  [facility]   Procedure: 2D Echo, 3D Echo, Cardiac Doppler and Color Doppler   Indications:    I35.0 Aortic Stenosis     History:        Patient has prior history of Echocardiogram examinations,  most                  recent 02/12/2021. CHF, Aortic Valve Disease,  Arrythmias:Atrial                  Fibrillation and Atrial Flutter, Signs/Symptoms:Edema and                  Fatigue; Risk Factors:Family History of Coronary Artery  Disease,                  Hypertension, Diabetes, Dyslipidemia and Sleep Apnea.                  Non-Ischemic Cardiomyopathy                  Aortic Stenosis (prior Mean Gradient 6mmHG).     Sonographer:    Deliah Boston RDCS  Referring Phys: Nadean Corwin HILTY   IMPRESSIONS     1. Left ventricular ejection fraction, by estimation, is 60 to 65%. Left  ventricular ejection fraction by 3D volume is 60 %. The left ventricle has  normal function. The left  ventricle has no regional wall motion  abnormalities. There is mild left  ventricular hypertrophy of the basal-septal segment. Left ventricular  diastolic parameters are consistent with Grade I diastolic dysfunction  (impaired relaxation). Elevated left ventricular end-diastolic pressure.   2. Right ventricular systolic function is normal. The right ventricular  size is normal.   3. The mitral valve is normal in structure. Trivial mitral valve  regurgitation. No evidence of mitral stenosis.   4. The aortic valve is calcified. There is severe calcifcation of the  aortic valve and the right coronary cusp appears fixed. There is severe  thickening of the aortic valve. Aortic valve regurgitation is mild to  moderate. Moderate to severe aortic valve   stenosis. Aortic regurgitation PHT measures 449 msec. Aortic valve area,  by VTI measures 0.52 cm. Aortic valve mean gradient measures 32.0 mmHg.  Aortic valve Vmax measures 3.80 m/s.   5. The inferior vena cava is normal in size with greater than 50%  respiratory variability, suggesting right atrial pressure of 3 mmHg.   6. Compared to study dated 02/12/2021, the mean AVG has decreased from 34  to 56mmHg, Vmax has increased from 3.63 to 3.50m/s and DI has decreased  from 0.20 to 0.19.   FINDINGS   Left Ventricle: Left ventricular ejection fraction, by estimation, is 60  to 65%. Left ventricular ejection fraction by 3D volume is 60 %. The left  ventricle has normal function. The left ventricle has no regional wall  motion abnormalities. The left  ventricular internal cavity size was normal in size. There is mild left  ventricular hypertrophy of the basal-septal segment. Left ventricular  diastolic parameters are consistent with Grade I diastolic dysfunction  (impaired relaxation). Elevated left  ventricular end-diastolic pressure.   Right Ventricle: The right ventricular size is normal. No increase in  right ventricular wall thickness. Right  ventricular systolic function is  normal.   Left Atrium: Left atrial size was normal in size.   Right Atrium: Right atrial size  was normal in size.   Pericardium: There is no evidence of pericardial effusion.   Mitral Valve: The mitral valve is normal in structure. Trivial mitral  valve regurgitation. No evidence of mitral valve stenosis.   Tricuspid Valve: The tricuspid valve is normal in structure. Tricuspid  valve regurgitation is not demonstrated. No evidence of tricuspid  stenosis.   Aortic Valve: The aortic valve is calcified. There is severe calcifcation  of the aortic valve. There is severe thickening of the aortic valve.  Aortic valve regurgitation is mild to moderate. Aortic regurgitation PHT  measures 449 msec. Moderate to severe  aortic stenosis is present. Aortic valve mean gradient measures 32.0 mmHg.  Aortic valve peak gradient measures 57.8 mmHg. Aortic valve area, by VTI  measures 0.52 cm.   Pulmonic Valve: The pulmonic valve was normal in structure. Pulmonic valve  regurgitation is trivial. No evidence of pulmonic stenosis.   Aorta: The aortic root is normal in size and structure.   Venous: The inferior vena cava is normal in size with greater than 50%  respiratory variability, suggesting right atrial pressure of 3 mmHg.   IAS/Shunts: No atrial level shunt detected by color flow Doppler.      LEFT VENTRICLE  PLAX 2D  LVIDd:         4.80 cm         Diastology  LVIDs:         3.10 cm         LV e' medial:    5.22 cm/s  LV PW:         0.70 cm         LV E/e' medial:  13.7  LV IVS:        1.20 cm         LV e' lateral:   6.09 cm/s  LVOT diam:     2.00 cm         LV E/e' lateral: 11.7  LV SV:         45  LV SV Index:   26  LVOT Area:     3.14 cm        3D Volume EF                                 LV 3D EF:    Left                                              ventricul                                              ar                                               ejection                                              fraction  by 3D                                              volume is                                              60 %.                                    3D Volume EF:                                 3D EF:        60 %                                 LV EDV:       109 ml                                 LV ESV:       44 ml                                 LV SV:        66 ml   RIGHT VENTRICLE  RV S prime:     12.10 cm/s  TAPSE (M-mode): 1.9 cm   LEFT ATRIUM             Index        RIGHT ATRIUM           Index  LA diam:        3.50 cm 1.99 cm/m   RA Area:     15.90 cm  LA Vol (A2C):   41.0 ml 23.29 ml/m  RA Volume:   42.30 ml  24.03 ml/m  LA Vol (A4C):   53.1 ml 30.17 ml/m  LA Biplane Vol: 46.5 ml 26.42 ml/m   AORTIC VALVE  AV Area (Vmax):    0.63 cm  AV Area (Vmean):   0.62 cm  AV Area (VTI):     0.52 cm  AV Vmax:           380.00 cm/s  AV Vmean:          266.000 cm/s  AV VTI:            0.874 m  AV Peak Grad:      57.8 mmHg  AV Mean Grad:      32.0 mmHg  LVOT Vmax:         76.35 cm/s  LVOT Vmean:        52.450 cm/s  LVOT VTI:          0.145 m  LVOT/AV VTI ratio: 0.17  AI PHT:            449 msec     AORTA  Ao Root diam: 3.60 cm  Ao Asc diam:  3.50 cm  MITRAL VALVE  MV Area (PHT): cm         SHUNTS  MV Decel Time: 152 msec    Systemic VTI:  0.14 m  MV E velocity: 71.50 cm/s  Systemic Diam: 2.00 cm  MV A velocity: 88.45 cm/s  MV E/A ratio:  0.81   Fransico Him MD  Electronically signed by Fransico Him MD  Signature Date/Time: 08/16/2021/3:26:39 PM         Final       Physicians   Panel Physicians Referring Physician Case Authorizing Physician  Burnell Blanks, MD (Primary)        Procedures   RIGHT/LEFT HEART CATH AND CORONARY ANGIOGRAPHY    Conclusion       Mid LAD lesion is 40% stenosed.   1st Mrg lesion is 20% stenosed.    Hemodynamic findings consistent with mild pulmonary hypertension.   Mild non-obstructive CAD Mild pulmonary HTN Severe aortic stenosis by echo (Cath data: mean gradient 27.3 mmHg, peak to peak gradient 32 mmHg, AVA 0.66 cm2).    Recommendations: Will continue planning for TAVR   Indications   Severe aortic stenosis [I35.0 (ICD-10-CM)]    Procedural Details   Technical Details Indication: Severe aortic stenosis  Procedure: The risks, benefits, complications, treatment options, and expected outcomes were discussed with the patient. The patient and/or family concurred with the proposed plan, giving informed consent. The patient was brought to the cath lab after IV hydration was given. The patient was sedated with Versed and Fentanyl. The IV catheter in the right antecubital vein was changed for a 5 [place] sheath. Right heart catheterization performed with a balloon tipped catheter. The right wrist was prepped and draped in a sterile fashion. 1% lidocaine was used for local anesthesia. Using the modified Seldinger access technique, a 5 French sheath was placed in the right radial artery. 3 mg Verapamil was given through the sheath. 4500 units IV heparin was given. Standard diagnostic catheters were used to perform selective coronary angiography. I crossed the aortic valve with an AL-1 catheter and the J wire. The sheath was removed from the right radial artery and a Terumo hemostasis band was applied at the arteriotomy site on the right wrist.      Estimated blood loss <50 mL.   During this procedure medications were administered to achieve and maintain moderate conscious sedation while the patient's heart rate, blood pressure, and oxygen saturation were continuously monitored and I was present face-to-face 100% of this time.    Medications (Filter: Administrations occurring from 0826 to 0919 on 09/17/21) midazolam (VERSED) injection (mg) Total dose:  1 mg Date/Time Rate/Dose/Volume Action     09/17/21 0843 1 mg Given      fentaNYL (SUBLIMAZE) injection (mcg) Total dose:  25 mcg Date/Time Rate/Dose/Volume Action    09/17/21 0843 25 mcg Given      lidocaine (PF) (XYLOCAINE) 1 % injection (mL) Total volume:  4 mL Date/Time Rate/Dose/Volume Action    09/17/21 0849 2 mL Given    0852 2 mL Given      Radial Cocktail/Verapamil only (mL) Total volume:  10 mL Date/Time Rate/Dose/Volume Action    09/17/21 0852 10 mL Given      heparin sodium (porcine) injection (Units) Total dose:  4,500 Units Date/Time Rate/Dose/Volume Action    09/17/21 0858 4,500 Units Given      iohexol (OMNIPAQUE) 350 MG/ML injection (mL) Total volume:  45 mL Date/Time Rate/Dose/Volume Action    09/17/21 0916 45 mL Given  Heparin (Porcine) in NaCl 1000-0.9 UT/500ML-% SOLN (mL) Total volume:  1,000 mL Date/Time Rate/Dose/Volume Action    09/17/21 0916 500 mL Given    0916 500 mL Given      Sedation Time   Sedation Time Physician-1: 24 minutes 36 seconds Contrast   Medication Name Total Dose  iohexol (OMNIPAQUE) 350 MG/ML injection 45 mL    Radiation/Fluoro   Fluoro time: 6.3 (min) DAP: 13163 (mGycm2) Cumulative Air Kerma: 229 (mGy) Coronary Findings   Diagnostic Dominance: Right Left Anterior Descending  Vessel is large.  Mid LAD lesion is 40% stenosed.    Left Circumflex  Vessel is large.    First Obtuse Marginal Branch  Vessel is moderate in size.  1st Mrg lesion is 20% stenosed.    Intervention    No interventions have been documented.    Right Heart   Right Heart Pressures Hemodynamic findings consistent with mild pulmonary hypertension. Elevated LV EDP consistent with volume overload.    Coronary Diagrams   Diagnostic Dominance: Right Intervention   Implants      No implant documentation for this case.    Syngo Images    Show images for CARDIAC CATHETERIZATION Images on Long Term Storage    Show images for [place], Cremeens to Procedure Log    Procedure Log    Hemo Data   Flowsheet Row Most Recent Value  Fick Cardiac Output 4.85 L/min  Fick Cardiac Output Index 2.62 (L/min)/BSA  Aortic Mean Gradient 27.3 mmHg  Aortic Peak Gradient 32 mmHg  Aortic Valve Area 0.66  Aortic Value Area Index 0.36 cm2/BSA  RA A Wave 15 mmHg  RA V Wave 14 mmHg  RA Mean 13 mmHg  RV Systolic Pressure 40 mmHg  RV Diastolic Pressure 10 mmHg  RV EDP 15 mmHg  PA Systolic Pressure 46 mmHg  PA Diastolic Pressure 24 mmHg  PA Mean 34 mmHg  PW A Wave 24 mmHg  PW V Wave 33 mmHg  PW Mean 24 mmHg  AO Systolic Pressure 542 mmHg  AO Diastolic Pressure 49 mmHg  AO Mean 72 mmHg  LV Systolic Pressure 706 mmHg  LV Diastolic Pressure 16 mmHg  LV EDP 23 mmHg  AOp Systolic Pressure 237 mmHg  AOp Diastolic Pressure 71 mmHg  AOp Mean Pressure 628 mmHg  LVp Systolic Pressure 315 mmHg  LVp Diastolic Pressure 20 mmHg  LVp EDP Pressure 25 mmHg  QP/QS 1  TPVR Index 12.94 HRUI  TSVR Index 27.4 HRUI  PVR SVR Ratio 0.17  TPVR/TSVR Ratio 0.47      ADDENDUM REPORT: 09/27/2021 18:12   CLINICAL DATA:  Aortic Valve pathology with assessment for TAVR   EXAM: Cardiac TAVR CT   TECHNIQUE: The patient was scanned on a Siemens Force 176 slice scanner. A 120 kV retrospective scan was triggered in the descending thoracic aorta at 111 HU's. Gantry rotation speed was 270 msecs and collimation was .9 mm. No beta blockade or nitro were given. The 3D data set was reconstructed in 5% intervals of the R-R cycle. Systolic and diastolic phases were analyzed on a dedicated work station using MPR, MIP and VRT modes. The patient received 100 cc of contrast.   FINDINGS: Aortic Valve: Severely thickened aortic valve with heavy calcification and reduced excursion the planimeter valve area is 0.99 Sq cm consistent with severe aortic stenosis.   Number of leaflets: This valve is a functionally bicuspid with L-R fusion and small raphe.   Annular calcification: Moderate-  there is  a single calcified nodule but with protrusion into the annular lumen.   Aortic Valve Calcium Score: 2874   Presence of significant basal septal hypertrophy: no   Perimembranous septal diameter: 8 mm   Aortic Annulus Measurements- 10% phase   Major annulus diameter: 25 mm   Minor annulus diameter:22 mm   Annular perimeter: 74 mm   Annular area: 4.2 cm2   Aortic Root Measurements   Sinotubular Junction: 29 mm   Ascending Thoracic Aorta: 37 mm   Aortic Arch: 28 mm   Descending Thoracic Aorta: 26 mm   Sinus of Valsalva Measurements:   Right coronary cusp width: 27 mm   Left coronary cusp width: 27 mm   Non coronary cusp width: 33 mm   Mean diameter: 31 mm   There is notable sinus asymmetry   Coronary Artery Height above Annulus:   Left Main: 12 mm   Left SoV height: 17 mm   Right Coronary: 12 mm   Right SoV height: 17 mm   Optimum Fluoroscopic Angle for Delivery: LAO 4, CAU 2   Valves for structural team consideration: 23 mm Edwards Sapien Valve   Non TAVR Valve Findings:   Coronary Arteries: Normal coronary origins.   Coronary Calcium Score:   Left main: 0   Left anterior descending artery: 80   Left circumflex artery: 13   Right coronary artery: 75   Total: 168   Percentile: 61st for age, sex, and race matched control.   Normal variant pulmonary veins- presence of a right middle pulmonary vein.   No left atrial appendage thrombus.   IMPRESSION: 1. Severe Aortic stenosis. Findings pertinent to TAVR procedure are detailed above. Functional bicuspid valve with sinus asymmetry   2. Patient's total coronary artery calcium score is 168, which is 61st percentile for subjects of the same age, gender, and race based populations.   RECOMMENDATIONS:   Coronary artery calcium (CAC) score is a strong predictor of incident coronary heart disease (CHD) and provides predictive information beyond traditional risk factors. CAC scoring  is reasonable to use in the decision to withhold, postpone, or initiate statin therapy in intermediate-risk or selected borderline-risk asymptomatic adults (age 79-75 years and LDL-C >=70 to <190 mg/dL) who do not have diabetes or established atherosclerotic cardiovascular disease (ASCVD).* In intermediate-risk (10-year ASCVD risk >=7.5% to <20%) adults or selected borderline-risk (10-year ASCVD risk >=5% to <7.5%) adults in whom a CAC score is measured for the purpose of making a treatment decision the following recommendations have been made:   If CAC = 0, it is reasonable to withhold statin therapy and reassess in 5 to 10 years, as long as higher risk conditions are absent (diabetes mellitus, family history of premature CHD in first degree relatives (males <55 years; females <65 years), cigarette smoking, LDL >=190 mg/dL or other independent risk factors).   If CAC is 1 to 99, it is reasonable to initiate statin therapy for patients >=36 years of age.   If CAC is >=100 or >=75th percentile, it is reasonable to initiate statin therapy at any age.   Cardiology referral should be considered for patients with CAC scores >=400 or >=75th percentile.   *2018 AHA/ACC/AACVPR/AAPA/ABC/ACPM/ADA/AGS/APhA/ASPC/NLA/PCNA Guideline on the Management of Blood Cholesterol: A Report of the American College of Cardiology/American Heart Association Task Force on Clinical Practice Guidelines. J Am Coll Cardiol. 2019;73(24):3168-3209.   Mahesh  Chandrasekhar     Electronically Signed   By: Rudean Haskell M.D.   On: 09/27/2021 18:12  Addended by Werner Lean, MD on 09/27/2021  6:14 PM    Study Result   Narrative & Impression  EXAM: OVER-READ INTERPRETATION  CT CHEST   The following report is an over-read performed by radiologist Dr. Salvatore Marvel of [facility], [place] on 09/26/2021. This over-read does not include interpretation of cardiac or coronary anatomy  or pathology. The coronary CTA interpretation by the cardiologist is attached.   COMPARISON:  None.   FINDINGS: Please see the separate concurrent chest CT angiogram report for details.   IMPRESSION: Please see the separate concurrent chest CT angiogram report for details.   Electronically Signed: By: Ilona Sorrel M.D. On: 09/26/2021 12:58        Narrative & Impression  CLINICAL DATA:  Aortic valve replacement (TAVR), pre-op eval. Aortic valve stenosis.   EXAM: CT ANGIOGRAPHY CHEST, ABDOMEN AND PELVIS   TECHNIQUE: Multidetector CT imaging through the chest, abdomen and pelvis was performed using the standard protocol during bolus administration of intravenous contrast. Multiplanar reconstructed images and MIPs were obtained and reviewed to evaluate the vascular anatomy.   CONTRAST:  149mL OMNIPAQUE IOHEXOL 350 MG/ML SOLN   COMPARISON:  None.   FINDINGS: CTA CHEST FINDINGS   Cardiovascular: Mild cardiomegaly. Diffuse thickening and coarse calcification of the aortic valve. No significant pericardial effusion/thickening. Left anterior descending and right coronary atherosclerosis. Atherosclerotic nonaneurysmal thoracic aorta. Normal caliber pulmonary arteries. No central pulmonary emboli.   Mediastinum/Nodes: No discrete thyroid nodules. Unremarkable esophagus. No pathologically enlarged axillary, mediastinal or hilar lymph nodes.   Lungs/Pleura: No pneumothorax. No pleural effusion. No acute consolidative airspace disease or lung masses. Solid 0.2 cm peripheral right lower lobe pulmonary nodule (series 7/image 63). No additional significant pulmonary nodules.   Musculoskeletal: No aggressive appearing focal osseous lesions. Moderate thoracic spondylosis.   CTA ABDOMEN AND PELVIS FINDINGS   Hepatobiliary: Normal liver with no liver mass. Normal gallbladder with no radiopaque cholelithiasis. No biliary ductal dilatation.   Pancreas: Normal, with no mass or  duct dilation.   Spleen: Normal size. No mass.   Adrenals/Urinary Tract: No discrete adrenal nodules. Subcentimeter hypodense medial upper left renal cortical lesion, too small to characterize, requiring no follow-up. No additional contour deforming renal lesions. No hydronephrosis. Normal bladder.   Stomach/Bowel: Normal non-distended stomach. Normal caliber small bowel with no small bowel wall thickening. Normal appendix. Normal large bowel with no diverticulosis, large bowel wall thickening or pericolonic fat stranding.   Vascular/Lymphatic: Mildly atherosclerotic nonaneurysmal abdominal aorta. No pathologically enlarged lymph nodes in the abdomen or pelvis.   Reproductive: Status post hysterectomy. Simple appearing 1.4 cm left adnexal cyst abutting the left vaginal cuff (series 6/image 203). No right adnexal mass.   Other: No pneumoperitoneum, ascites or focal fluid collection.   Musculoskeletal: No aggressive appearing focal osseous lesions. Marked multilevel lumbar degenerative disc disease.   VASCULAR MEASUREMENTS PERTINENT TO TAVR:   AORTA:   Minimal Aortic Diameter-13.5 x 13.3 mm   Severity of Aortic Calcification-mild   RIGHT PELVIS:   Right Common Iliac Artery -   Minimal Diameter-9.5 x 9.3 mm   Tortuosity-mild   Calcification-mild   Right External Iliac Artery -   Minimal Diameter-6.7 x 6.6 mm   Tortuosity-moderate to severe   Calcification-mild   Right Common Femoral Artery -   Minimal Diameter-7.4 x 7.2 mm   Tortuosity-mild   Calcification-none   LEFT PELVIS:   Left Common Iliac Artery -   Minimal Diameter-9.4 x 7.7 mm   Tortuosity-mild-to-moderate   Calcification-mild  Left External Iliac Artery -   Minimal Diameter-6.7 x 6.7 mm   Tortuosity-moderate   Calcification-none   Left Common Femoral Artery -   Minimal Diameter-7.1 x 6.6 mm   Tortuosity-mild   Calcification-mild   Review of the MIP images confirms the  above findings.   IMPRESSION: 1. Vascular findings and measurements pertinent to potential TAVR procedure, as detailed. 2. Diffuse thickening and coarse calcification of the aortic valve, compatible with reported aortic stenosis. 3. Mild cardiomegaly. Two-vessel coronary atherosclerosis. 4. Tiny 0.2 cm solid right lower lobe pulmonary nodule. No follow-up needed if patient is low-risk. Non-contrast chest CT can be considered in 12 months if patient is high-risk. This recommendation follows the consensus statement: Guidelines for Management of Incidental Pulmonary Nodules Detected on CT Images: From the Fleischner Society 2017; Radiology 2017; 284:228-243. 5. Simple appearing 1.4 cm left adnexal cyst abutting the left vaginal cuff. No follow-up imaging recommended. Note: This recommendation does not apply to premenarchal patients and to those with increased risk (genetic, family history, elevated tumor markers or other high-risk factors) of ovarian cancer. Reference: JACR 2020 Feb; 17(2):248-254 6. Aortic Atherosclerosis (ICD10-I70.0).     Electronically Signed   By: Ilona Sorrel M.D.   On: 09/26/2021 13:26      STS Risk Score: Risk of Mortality:  3.047%  Renal Failure:  3.919%  Permanent Stroke:  1.721%  Prolonged Ventilation:  8.707%  DSW Infection:  0.189%  Reoperation:  2.823%  Morbidity or Mortality:  12.507%  Short Length of Stay:  21.426%  Long Length of Stay:  8.286%    Impression:   This 81 year old woman has stage D, severe, symptomatic aortic stenosis with New York Heart Association class II symptoms of exertional fatigue and shortness of breath consistent with chronic diastolic congestive heart failure.  I have personally reviewed her 2D echocardiogram, cardiac catheterization, and CTA studies.  Echocardiogram shows a severely calcified and thickened aortic valve with restricted leaflet mobility.  The mean gradient was 32 mmHg with a peak gradient of 58  mmHg and a valve area of 0.52 cm.  Stroke-volume index is low at 26.  There is mild to moderate aortic insufficiency.  Left ventricular ejection fraction is normal.  Cardiac catheterization showed mild nonobstructive coronary disease.  There is mild pulmonary hypertension with a PA pressure of 46/24 and a mean wedge pressure of 24.  I agree that aortic valve replacement is the best treatment for this patient to improve her symptoms and prevent progressive left ventricular deterioration.  Given her age and comorbid risk factors I think that transcatheter aortic valve replacement would be the best option for her.  Her gated cardiac CTA shows anatomy suitable for TAVR using a SAPIEN 3 valve.  Her abdominal and pelvic CTA shows adequate pelvic vascular anatomy to allow transfemoral insertion.   The patient was counseled at length regarding treatment alternatives for management of severe symptomatic aortic stenosis. The risks and benefits of surgical intervention has been discussed in detail. Long-term prognosis with medical therapy was discussed. Alternative approaches such as conventional surgical aortic valve replacement, transcatheter aortic valve replacement, and palliative medical therapy were compared and contrasted at length. This discussion was placed in the context of the patient's own specific clinical presentation and past medical history. All of her questions have been addressed.    Following the decision to proceed with transcatheter aortic valve replacement, a discussion was held regarding what types of management strategies would be attempted intraoperatively in the event of life-threatening complications,  including whether or not the patient would be considered a candidate for the use of cardiopulmonary bypass and/or conversion to open sternotomy for attempted surgical intervention.  She is 81 years old but I think she would be a candidate for emergent sternotomy to manage any intraoperative  complications.  The patient is aware of the fact that transient use of cardiopulmonary bypass may be necessary. The patient has been advised of a variety of complications that might develop including but not limited to risks of death, stroke, paravalvular leak, aortic dissection or other major vascular complications, aortic annulus rupture, device embolization, cardiac rupture or perforation, mitral regurgitation, acute myocardial infarction, arrhythmia, heart block or bradycardia requiring permanent pacemaker placement, congestive heart failure, respiratory failure, renal failure, pneumonia, infection, other late complications related to structural valve deterioration or migration, or other complications that might ultimately cause a temporary or permanent loss of functional independence or other long term morbidity. The patient provides full informed consent for the procedure as described and all questions were answered.       Plan:   Transfemoral TAVR using a SAPIEN 3 valve.

## 2021-10-30 ENCOUNTER — Other Ambulatory Visit: Payer: Self-pay

## 2021-10-30 ENCOUNTER — Inpatient Hospital Stay (HOSPITAL_COMMUNITY): Payer: Medicare PPO

## 2021-10-30 ENCOUNTER — Other Ambulatory Visit: Payer: Self-pay | Admitting: Physician Assistant

## 2021-10-30 ENCOUNTER — Inpatient Hospital Stay (HOSPITAL_COMMUNITY): Payer: Medicare PPO | Admitting: Anesthesiology

## 2021-10-30 ENCOUNTER — Inpatient Hospital Stay (HOSPITAL_COMMUNITY): Payer: Medicare PPO | Admitting: Vascular Surgery

## 2021-10-30 ENCOUNTER — Inpatient Hospital Stay (HOSPITAL_COMMUNITY)
Admission: RE | Admit: 2021-10-30 | Discharge: 2021-10-31 | DRG: 267 | Disposition: A | Discharge status: Home / Self Care | Payer: Medicare PPO | Attending: Cardiovascular Disease | Admitting: Cardiovascular Disease

## 2021-10-30 ENCOUNTER — Encounter (HOSPITAL_COMMUNITY)
Admission: RE | Disposition: A | Discharge status: Home / Self Care | Payer: Self-pay | Source: Home / Self Care | Attending: Cardiovascular Disease

## 2021-10-30 ENCOUNTER — Encounter (HOSPITAL_COMMUNITY): Payer: Self-pay | Admitting: Cardiovascular Disease

## 2021-10-30 DIAGNOSIS — Z90710 Acquired absence of both cervix and uterus: Secondary | ICD-10-CM

## 2021-10-30 DIAGNOSIS — Z8249 Family history of ischemic heart disease and other diseases of the circulatory system: Secondary | ICD-10-CM | POA: Diagnosis not present

## 2021-10-30 DIAGNOSIS — E1122 Type 2 diabetes mellitus with diabetic chronic kidney disease: Secondary | ICD-10-CM | POA: Diagnosis present

## 2021-10-30 DIAGNOSIS — Z79899 Other long term (current) drug therapy: Secondary | ICD-10-CM

## 2021-10-30 DIAGNOSIS — E785 Hyperlipidemia, unspecified: Secondary | ICD-10-CM | POA: Diagnosis present

## 2021-10-30 DIAGNOSIS — K219 Gastro-esophageal reflux disease without esophagitis: Secondary | ICD-10-CM | POA: Diagnosis present

## 2021-10-30 DIAGNOSIS — Z20822 Contact with and (suspected) exposure to covid-19: Secondary | ICD-10-CM | POA: Diagnosis present

## 2021-10-30 DIAGNOSIS — I272 Pulmonary hypertension, unspecified: Secondary | ICD-10-CM | POA: Diagnosis present

## 2021-10-30 DIAGNOSIS — Z833 Family history of diabetes mellitus: Secondary | ICD-10-CM | POA: Diagnosis not present

## 2021-10-30 DIAGNOSIS — F32A Depression, unspecified: Secondary | ICD-10-CM | POA: Diagnosis present

## 2021-10-30 DIAGNOSIS — I130 Hypertensive heart and chronic kidney disease with heart failure and stage 1 through stage 4 chronic kidney disease, or unspecified chronic kidney disease: Secondary | ICD-10-CM | POA: Diagnosis present

## 2021-10-30 DIAGNOSIS — I350 Nonrheumatic aortic (valve) stenosis: Secondary | ICD-10-CM

## 2021-10-30 DIAGNOSIS — E039 Hypothyroidism, unspecified: Secondary | ICD-10-CM | POA: Diagnosis present

## 2021-10-30 DIAGNOSIS — Z85820 Personal history of malignant melanoma of skin: Secondary | ICD-10-CM

## 2021-10-30 DIAGNOSIS — D509 Iron deficiency anemia, unspecified: Secondary | ICD-10-CM | POA: Diagnosis present

## 2021-10-30 DIAGNOSIS — Y840 Cardiac catheterization as the cause of abnormal reaction of the patient, or of later complication, without mention of misadventure at the time of the procedure: Secondary | ICD-10-CM | POA: Diagnosis present

## 2021-10-30 DIAGNOSIS — I5022 Chronic systolic (congestive) heart failure: Secondary | ICD-10-CM | POA: Diagnosis present

## 2021-10-30 DIAGNOSIS — G4733 Obstructive sleep apnea (adult) (pediatric): Secondary | ICD-10-CM | POA: Diagnosis present

## 2021-10-30 DIAGNOSIS — Z823 Family history of stroke: Secondary | ICD-10-CM

## 2021-10-30 DIAGNOSIS — I428 Other cardiomyopathies: Secondary | ICD-10-CM | POA: Diagnosis present

## 2021-10-30 DIAGNOSIS — Z809 Family history of malignant neoplasm, unspecified: Secondary | ICD-10-CM

## 2021-10-30 DIAGNOSIS — Z880 Allergy status to penicillin: Secondary | ICD-10-CM

## 2021-10-30 DIAGNOSIS — R221 Localized swelling, mass and lump, neck: Secondary | ICD-10-CM | POA: Diagnosis not present

## 2021-10-30 DIAGNOSIS — I4819 Other persistent atrial fibrillation: Secondary | ICD-10-CM | POA: Diagnosis present

## 2021-10-30 DIAGNOSIS — D649 Anemia, unspecified: Secondary | ICD-10-CM | POA: Diagnosis present

## 2021-10-30 DIAGNOSIS — G8929 Other chronic pain: Secondary | ICD-10-CM | POA: Diagnosis present

## 2021-10-30 DIAGNOSIS — I97630 Postprocedural hematoma of a circulatory system organ or structure following a cardiac catheterization: Secondary | ICD-10-CM | POA: Diagnosis present

## 2021-10-30 DIAGNOSIS — I2510 Atherosclerotic heart disease of native coronary artery without angina pectoris: Secondary | ICD-10-CM | POA: Diagnosis present

## 2021-10-30 DIAGNOSIS — Z952 Presence of prosthetic heart valve: Secondary | ICD-10-CM | POA: Diagnosis not present

## 2021-10-30 DIAGNOSIS — Z79890 Hormone replacement therapy (postmenopausal): Secondary | ICD-10-CM

## 2021-10-30 DIAGNOSIS — Z006 Encounter for examination for normal comparison and control in clinical research program: Secondary | ICD-10-CM | POA: Diagnosis not present

## 2021-10-30 DIAGNOSIS — I352 Nonrheumatic aortic (valve) stenosis with insufficiency: Secondary | ICD-10-CM | POA: Diagnosis present

## 2021-10-30 DIAGNOSIS — Z7901 Long term (current) use of anticoagulants: Secondary | ICD-10-CM

## 2021-10-30 DIAGNOSIS — I480 Paroxysmal atrial fibrillation: Secondary | ICD-10-CM | POA: Diagnosis present

## 2021-10-30 DIAGNOSIS — I10 Essential (primary) hypertension: Secondary | ICD-10-CM | POA: Diagnosis present

## 2021-10-30 DIAGNOSIS — N183 Chronic kidney disease, stage 3 unspecified: Secondary | ICD-10-CM | POA: Diagnosis present

## 2021-10-30 DIAGNOSIS — G2581 Restless legs syndrome: Secondary | ICD-10-CM | POA: Diagnosis present

## 2021-10-30 DIAGNOSIS — E7800 Pure hypercholesterolemia, unspecified: Secondary | ICD-10-CM | POA: Diagnosis present

## 2021-10-30 DIAGNOSIS — D6489 Other specified anemias: Secondary | ICD-10-CM | POA: Diagnosis present

## 2021-10-30 HISTORY — PX: INTRAOPERATIVE TRANSTHORACIC ECHOCARDIOGRAM: SHX6523

## 2021-10-30 HISTORY — DX: Presence of prosthetic heart valve: Z95.2

## 2021-10-30 HISTORY — PX: TRANSCATHETER AORTIC VALVE REPLACEMENT, TRANSFEMORAL: SHX6400

## 2021-10-30 LAB — ECHOCARDIOGRAM LIMITED
AR max vel: 0.76 cm2
AV Area VTI: 0.71 cm2
AV Area mean vel: 0.65 cm2
AV Mean grad: 26.7 mmHg
AV Peak grad: 37.3 mmHg
Ao pk vel: 3.05 m/s

## 2021-10-30 LAB — POCT I-STAT, CHEM 8
BUN: 23 mg/dL (ref 8–23)
BUN: 25 mg/dL — ABNORMAL HIGH (ref 8–23)
Calcium, Ion: 1.23 mmol/L (ref 1.15–1.40)
Calcium, Ion: 1.26 mmol/L (ref 1.15–1.40)
Chloride: 99 mmol/L (ref 98–111)
Chloride: 98 mmol/L (ref 98–111)
Creatinine, Ser: 0.8 mg/dL (ref 0.44–1.00)
Creatinine, Ser: 0.8 mg/dL (ref 0.44–1.00)
Glucose, Bld: 168 mg/dL — ABNORMAL HIGH (ref 70–99)
Glucose, Bld: 152 mg/dL — ABNORMAL HIGH (ref 70–99)
HCT: 36 % (ref 36.0–46.0)
HCT: 37 % (ref 36.0–46.0)
Hemoglobin: 12.2 g/dL (ref 12.0–15.0)
Hemoglobin: 12.6 g/dL (ref 12.0–15.0)
Potassium: 3.8 mmol/L (ref 3.5–5.1)
Potassium: 3.8 mmol/L (ref 3.5–5.1)
Sodium: 138 mmol/L (ref 135–145)
Sodium: 138 mmol/L (ref 135–145)
TCO2: 30 mmol/L (ref 22–32)
TCO2: 30 mmol/L (ref 22–32)

## 2021-10-30 LAB — GLUCOSE, CAPILLARY
Glucose-Capillary: 132 mg/dL — ABNORMAL HIGH (ref 70–99)
Glucose-Capillary: 133 mg/dL — ABNORMAL HIGH (ref 70–99)
Glucose-Capillary: 192 mg/dL — ABNORMAL HIGH (ref 70–99)

## 2021-10-30 LAB — POCT ACTIVATED CLOTTING TIME
Activated Clotting Time: 137 seconds
Activated Clotting Time: 320 seconds
Activated Clotting Time: 120 seconds

## 2021-10-30 LAB — SARS CORONAVIRUS 2 BY RT PCR (HOSPITAL ORDER, PERFORMED IN ~~LOC~~ HOSPITAL LAB): SARS Coronavirus 2: NEGATIVE

## 2021-10-30 LAB — HEMOGLOBIN A1C
Hgb A1c MFr Bld: 6.3 % — ABNORMAL HIGH (ref 4.8–5.6)
Mean Plasma Glucose: 134 mg/dL

## 2021-10-30 LAB — ABO/RH: ABO/RH(D): A NEG

## 2021-10-30 SURGERY — IMPLANTATION, AORTIC VALVE, TRANSCATHETER, FEMORAL APPROACH
Anesthesia: Monitor Anesthesia Care

## 2021-10-30 MED ORDER — ROPINIROLE HCL 0.5 MG PO TABS
0.5000 mg | ORAL_TABLET | Freq: Two times a day (BID) | ORAL | Status: DC
  Filled 2021-10-30: qty 1

## 2021-10-30 MED ORDER — ROPINIROLE HCL 0.5 MG PO TABS: 0.5000 mg | ORAL_TABLET | ORAL | Status: DC

## 2021-10-30 MED ORDER — SODIUM CHLORIDE 0.9% FLUSH: 3.0000 mL | INTRAVENOUS | Status: DC | PRN

## 2021-10-30 MED ORDER — LIDOCAINE HCL 1 % IJ SOLN
INTRAMUSCULAR | Status: AC
  Filled 2021-10-30: qty 20

## 2021-10-30 MED ORDER — CHLORHEXIDINE GLUCONATE 0.12 % MT SOLN
15.0000 mL | Freq: Once | OROMUCOSAL | Status: DC
  Filled 2021-10-30 (×2): qty 15

## 2021-10-30 MED ORDER — PROTAMINE SULFATE 10 MG/ML IV SOLN
INTRAVENOUS | Status: DC | PRN
  Administered 2021-10-30: 130 mg via INTRAVENOUS

## 2021-10-30 MED ORDER — SODIUM CHLORIDE 0.9 % IV SOLN: INTRAVENOUS | Status: DC

## 2021-10-30 MED ORDER — SPIRONOLACTONE 12.5 MG HALF TABLET
12.5000 mg | ORAL_TABLET | Freq: Every day | ORAL | Status: DC
Start: 2021-10-30 — End: 2021-10-31
  Filled 2021-10-30: qty 1

## 2021-10-30 MED ORDER — CHLORHEXIDINE GLUCONATE 0.12 % MT SOLN
15.0000 mL | Freq: Once | OROMUCOSAL | Status: AC
  Administered 2021-10-30: 11:00:00 15 mL via OROMUCOSAL
  Filled 2021-10-30: qty 15

## 2021-10-30 MED ORDER — PANTOPRAZOLE SODIUM 40 MG PO TBEC
40.0000 mg | DELAYED_RELEASE_TABLET | Freq: Every day | ORAL | Status: DC
  Filled 2021-10-30: qty 1

## 2021-10-30 MED ORDER — INSULIN ASPART 100 UNIT/ML IJ SOLN
0.0000 [IU] | Freq: Three times a day (TID) | INTRAMUSCULAR | Status: DC
  Administered 2021-10-30: 22:00:00 4 [IU] via SUBCUTANEOUS
  Administered 2021-10-31: 06:00:00 2 [IU] via SUBCUTANEOUS

## 2021-10-30 MED ORDER — LORATADINE 10 MG PO TABS
10.0000 mg | ORAL_TABLET | Freq: Every day | ORAL | Status: DC
Start: 2021-10-30 — End: 2021-10-31
  Administered 2021-10-31: 09:00:00 10 mg via ORAL
  Filled 2021-10-30: qty 1

## 2021-10-30 MED ORDER — IOHEXOL 350 MG/ML SOLN
INTRAVENOUS | Status: DC | PRN
  Administered 2021-10-30: 14:00:00 80 mL

## 2021-10-30 MED ORDER — SODIUM CHLORIDE 0.9 % IV SOLN: 250.0000 mL | INTRAVENOUS | Status: DC | PRN

## 2021-10-30 MED ORDER — MORPHINE SULFATE (PF) 2 MG/ML IV SOLN: 1.0000 mg | INTRAVENOUS | Status: DC | PRN

## 2021-10-30 MED ORDER — ORAL CARE MOUTH RINSE: 15.0000 mL | Freq: Once | OROMUCOSAL | Status: AC

## 2021-10-30 MED ORDER — SODIUM CHLORIDE 0.9% FLUSH
3.0000 mL | Freq: Two times a day (BID) | INTRAVENOUS | Status: DC
  Administered 2021-10-30 – 2021-10-31 (×2): 3 mL via INTRAVENOUS

## 2021-10-30 MED ORDER — ACETAMINOPHEN 500 MG PO TABS
1000.0000 mg | ORAL_TABLET | Freq: Once | ORAL | Status: AC
  Administered 2021-10-30: 11:00:00 1000 mg via ORAL
  Filled 2021-10-30: qty 2

## 2021-10-30 MED ORDER — HEPARIN (PORCINE) IN NACL 1000-0.9 UT/500ML-% IV SOLN
INTRAVENOUS | Status: AC
  Filled 2021-10-30: qty 1500

## 2021-10-30 MED ORDER — HEPARIN (PORCINE) IN NACL 1000-0.9 UT/500ML-% IV SOLN
INTRAVENOUS | Status: DC | PRN
  Administered 2021-10-30 (×3): 500 mL

## 2021-10-30 MED ORDER — ALPRAZOLAM 0.5 MG PO TABS
0.5000 mg | ORAL_TABLET | Freq: Two times a day (BID) | ORAL | Status: DC
  Administered 2021-10-31: 09:00:00 0.5 mg via ORAL
  Filled 2021-10-30: qty 1

## 2021-10-30 MED ORDER — FENTANYL CITRATE (PF) 100 MCG/2ML IJ SOLN
INTRAMUSCULAR | Status: DC | PRN
  Administered 2021-10-30: 50 ug via INTRAVENOUS
  Administered 2021-10-30: 25 ug via INTRAVENOUS

## 2021-10-30 MED ORDER — HEPARIN SODIUM (PORCINE) 1000 UNIT/ML IJ SOLN
INTRAMUSCULAR | Status: DC | PRN
  Administered 2021-10-30: 13000 [IU] via INTRAVENOUS

## 2021-10-30 MED ORDER — ATORVASTATIN CALCIUM 10 MG PO TABS
20.0000 mg | ORAL_TABLET | ORAL | Status: DC
Start: 2021-10-31 — End: 2021-10-31
  Filled 2021-10-30: qty 2

## 2021-10-30 MED ORDER — LACTATED RINGERS IV SOLN: INTRAVENOUS | Status: DC

## 2021-10-30 MED ORDER — ASPIRIN 81 MG PO CHEW
81.0000 mg | CHEWABLE_TABLET | Freq: Every day | ORAL | Status: DC
  Administered 2021-10-31: 09:00:00 81 mg via ORAL
  Filled 2021-10-30: qty 1

## 2021-10-30 MED ORDER — SODIUM CHLORIDE 0.9 % IV SOLN: INTRAVENOUS | Status: AC

## 2021-10-30 MED ORDER — CHLORHEXIDINE GLUCONATE 4 % EX LIQD
30.0000 mL | CUTANEOUS | Status: DC
  Filled 2021-10-30: qty 30

## 2021-10-30 MED ORDER — ROPINIROLE HCL 1 MG PO TABS
1.0000 mg | ORAL_TABLET | Freq: Every day | ORAL | Status: DC
  Administered 2021-10-30: 22:00:00 1 mg via ORAL
  Filled 2021-10-30 (×2): qty 1

## 2021-10-30 MED ORDER — ONDANSETRON HCL 4 MG/2ML IJ SOLN
INTRAMUSCULAR | Status: DC | PRN
  Administered 2021-10-30: 4 mg via INTRAVENOUS

## 2021-10-30 MED ORDER — PROPOFOL 10 MG/ML IV BOLUS
INTRAVENOUS | Status: DC | PRN
Start: 2021-10-30 — End: 2021-10-30
  Administered 2021-10-30 (×6): 20 mg via INTRAVENOUS

## 2021-10-30 MED ORDER — LEVOTHYROXINE SODIUM 88 MCG PO TABS
88.0000 ug | ORAL_TABLET | Freq: Every day | ORAL | Status: DC
Start: 2021-10-31 — End: 2021-10-31
  Administered 2021-10-31: 06:00:00 88 ug via ORAL
  Filled 2021-10-30: qty 1

## 2021-10-30 MED ORDER — CHLORHEXIDINE GLUCONATE 4 % EX LIQD
60.0000 mL | Freq: Once | CUTANEOUS | Status: DC
  Filled 2021-10-30: qty 60

## 2021-10-30 MED ORDER — NITROGLYCERIN IN D5W 200-5 MCG/ML-% IV SOLN: 0.0000 ug/min | INTRAVENOUS | Status: DC

## 2021-10-30 MED ORDER — OXYCODONE HCL 5 MG PO TABS: 5.0000 mg | ORAL_TABLET | ORAL | Status: DC | PRN

## 2021-10-30 MED ORDER — PROPOFOL 500 MG/50ML IV EMUL
INTRAVENOUS | Status: DC | PRN
  Administered 2021-10-30: 10 ug/kg/min via INTRAVENOUS

## 2021-10-30 MED ORDER — LIDOCAINE HCL (PF) 1 % IJ SOLN
INTRAMUSCULAR | Status: DC | PRN
  Administered 2021-10-30 (×2): 12 mL

## 2021-10-30 MED ORDER — ACETAMINOPHEN 650 MG RE SUPP: 650.0000 mg | Freq: Four times a day (QID) | RECTAL | Status: DC | PRN

## 2021-10-30 MED ORDER — VANCOMYCIN HCL IN DEXTROSE 1-5 GM/200ML-% IV SOLN
1000.0000 mg | Freq: Once | INTRAVENOUS | Status: AC
  Administered 2021-10-30: 23:00:00 1000 mg via INTRAVENOUS
  Filled 2021-10-30: qty 200

## 2021-10-30 MED ORDER — ACETAMINOPHEN 325 MG PO TABS
650.0000 mg | ORAL_TABLET | Freq: Four times a day (QID) | ORAL | Status: DC | PRN
  Administered 2021-10-31: 11:00:00 650 mg via ORAL
  Filled 2021-10-30: qty 2

## 2021-10-30 MED ORDER — TRAMADOL HCL 50 MG PO TABS: 50.0000 mg | ORAL_TABLET | ORAL | Status: DC | PRN

## 2021-10-30 MED ORDER — ONDANSETRON HCL 4 MG/2ML IJ SOLN: 4.0000 mg | Freq: Four times a day (QID) | INTRAMUSCULAR | Status: DC | PRN

## 2021-10-30 MED ORDER — FENTANYL CITRATE (PF) 100 MCG/2ML IJ SOLN
INTRAMUSCULAR | Status: AC
  Filled 2021-10-30: qty 2

## 2021-10-30 SURGICAL SUPPLY — 32 items
BAG SNAP BAND KOVER 36X36 (MISCELLANEOUS) ×12 IMPLANT
BLANKET WARM UNDERBOD FULL ACC (MISCELLANEOUS) ×4 IMPLANT
CABLE ADAPT PACING TEMP 12FT (ADAPTER) ×2 IMPLANT
CATH 23 ULTRA DELIVERY (CATHETERS) ×2 IMPLANT
CATH DIAG 6FR PIGTAIL ANGLED (CATHETERS) ×4 IMPLANT
CATH INFINITI 6F AL1 (CATHETERS) ×2 IMPLANT
CATH S G BIP PACING (CATHETERS) ×2 IMPLANT
CLOSURE MYNX CONTROL 6F/7F (Vascular Products) ×2 IMPLANT
CLOSURE PERCLOSE PROSTYLE (VASCULAR PRODUCTS) ×4 IMPLANT
COVER SURGICAL LIGHT HANDLE (MISCELLANEOUS) ×2 IMPLANT
CRIMPER (MISCELLANEOUS) ×2 IMPLANT
DEVICE INFLATION ATRION QL2530 (MISCELLANEOUS) ×2 IMPLANT
ELECT DEFIB PAD ADLT CADENCE (PAD) ×2 IMPLANT
GUIDEWIRE SAFE TJ AMPLATZ EXST (WIRE) ×2 IMPLANT
KIT HEART LEFT (KITS) ×4 IMPLANT
KIT MICROPUNCTURE NIT STIFF (SHEATH) ×2 IMPLANT
KIT SAPIAN 3 ULTRA RESILIA 23 (Valve) ×2 IMPLANT
PACK CARDIAC CATHETERIZATION (CUSTOM PROCEDURE TRAY) ×4 IMPLANT
PINNACLE LONG 7F 25CM (SHEATH) ×3
SHEATH INTRO PINNACLE 7F 25CM (SHEATH) IMPLANT
SHEATH INTRODUCER SET 20-26 (SHEATH) ×2 IMPLANT
SHEATH PINNACLE 6F 10CM (SHEATH) ×2 IMPLANT
SHEATH PINNACLE 8F 10CM (SHEATH) ×2 IMPLANT
SLEEVE REPOSITIONING LENGTH 30 (MISCELLANEOUS) ×2 IMPLANT
STOPCOCK MORSE 400PSI 3WAY (MISCELLANEOUS) ×8 IMPLANT
TRANSDUCER W/STOPCOCK (MISCELLANEOUS) ×8 IMPLANT
TUBING CIL FLEX 10 FLL-RA (TUBING) ×2 IMPLANT
TUBING CONTRAST HIGH PRESS 48 (TUBING) ×2 IMPLANT
WIRE AMPLATZ SS-J .035X180CM (WIRE) ×2 IMPLANT
WIRE EMERALD 3MM-J .035X150CM (WIRE) ×2 IMPLANT
WIRE EMERALD 3MM-J .035X260CM (WIRE) ×2 IMPLANT
WIRE EMERALD ST .035X260CM (WIRE) ×2 IMPLANT

## 2021-10-30 NOTE — Progress Notes (Signed)
°  Jenna [name]  Patient doing well s/p TAVR. She is hemodynamically stable. Groin sites stable. ECG with sinus brady but no high grade block. Arterial line discontinued and transferred to 4E. Has a bump on right wrist concerning for radial PSA from recent heart cath. Vascular US ordered to confirm. Plan for early ambulation after bedrest completed and hopeful discharge over the next 24-48 hours.   Angelena Form PA-C  [facility]  Pager [phone]

## 2021-10-30 NOTE — Discharge Instructions (Signed)

## 2021-10-30 NOTE — Anesthesia Procedure Notes (Signed)
Procedure Name: MAC Date/Time: 10/30/2021 1:20 PM Performed by: Inda Coke, CRNA Pre-anesthesia Checklist: Patient identified, Emergency Drugs available, Suction available, Timeout performed and Patient being monitored Patient Re-evaluated:Patient Re-evaluated prior to induction Oxygen Delivery Method: Simple face mask Induction Type: IV induction Dental Injury: Teeth and Oropharynx as per pre-operative assessment

## 2021-10-30 NOTE — Progress Notes (Signed)
Limited right upper extremity arterial duplex completed.  Refer to "CV Proc" under chart review to view preliminary results.  10/30/2021 5:02 PM Kelby Aline., MHA, RVT, RDCS, RDMS

## 2021-10-30 NOTE — Anesthesia Preprocedure Evaluation (Addendum)
Anesthesia Evaluation  Patient identified by MRN, date of birth, ID band Patient awake    Reviewed: Allergy & Precautions, NPO status , Patient's Chart, lab work & pertinent test results, reviewed documented beta blocker date and time   History of Anesthesia Complications (+) PONV and history of anesthetic complications  Airway Mallampati: III  TM Distance: >3 FB Neck ROM: Full    Dental  (+) Dental Advisory Given, Chipped   Pulmonary sleep apnea and Continuous Positive Airway Pressure Ventilation ,    Pulmonary exam normal        Cardiovascular hypertension, Pt. on medications and Pt. on home beta blockers +CHF  + dysrhythmias (s/p ablation) Atrial Fibrillation + Valvular Problems/Murmurs AS  Rhythm:Regular Rate:Normal + Systolic murmurs  '22 Cath - Mid LAD lesion is 40% stenosed.   1st Mrg lesion is 20% stenosed.   Hemodynamic findings consistent with mild pulmonary hypertension.  '22 TTE - EF 60 to 65%.Mild left ventricular hypertrophy of the basal-septal segment. Grade I diastolic dysfunction  (impaired relaxation). Trivial mitral valve regurgitation. There is severe calcifcation of the aortic valve and the right coronary cusp appears fixed. Mild to moderate AI. Moderate to severe AS. Aortic regurgitation PHT measures 449 msec. Aortic valve area, by VTI measures 0.52 cm. Aortic valve mean gradient measures 32.0 mmHg. Aortic valve Vmax measures 3.80 m/s. Compared to study dated 02/12/2021, the mean AVG has decreased from 34 to 56mHg, Vmax has increased from 3.63 to 3.830m and DI has decreased from 0.20 to 0.19.     Neuro/Psych PSYCHIATRIC DISORDERS Anxiety Depression  RLS     GI/Hepatic Neg liver ROS, GERD  Medicated and Controlled,  Endo/Other  diabetes, Type 2Hypothyroidism  Obesity   Renal/GU CRFRenal disease     Musculoskeletal  (+) Arthritis ,   Abdominal   Peds  Hematology  On eliquis    Anesthesia  Other Findings   Reproductive/Obstetrics                            Anesthesia Physical Anesthesia Plan  ASA: 4  Anesthesia Plan: MAC   Post-op Pain Management: Tylenol PO (pre-op)   Induction:   PONV Risk Score and Plan: 3 and Propofol infusion and Treatment may vary due to age or medical condition  Airway Management Planned: Natural Airway and Simple Face Mask  Additional Equipment: None  Intra-op Plan:   Post-operative Plan:   Informed Consent: I have reviewed the patients History and Physical, chart, labs and discussed the procedure including the risks, benefits and alternatives for the proposed anesthesia with the patient or authorized representative who has indicated his/her understanding and acceptance.       Plan Discussed with: CRNA and Anesthesiologist  Anesthesia Plan Comments:        Anesthesia Quick Evaluation

## 2021-10-30 NOTE — Progress Notes (Signed)
Arrived from cath lat.  S/p TVAR.  CHG completed.  CCMD notified . Vital signs per protocol.  A&O.  Bilateral groins level 0.  Lay flat x 4 hours ending at 1850.

## 2021-10-30 NOTE — Transfer of Care (Addendum)
Immediate Anesthesia Transfer of Care Note  Patient: Jenna [name]  Procedure(s) Performed: TRANSCATHETER AORTIC VALVE REPLACEMENT, TRANSFEMORAL INTRAOPERATIVE TRANSTHORACIC ECHOCARDIOGRAM  Patient Location: PACU and Cath Lab  Anesthesia Type:MAC  Level of Consciousness: awake and drowsy  Airway & Oxygen Therapy: Patient Spontanous Breathing and Patient connected to nasal cannula oxygen  Post-op Assessment: Report given to RN and Post -op Vital signs reviewed and stable  Post vital signs: Reviewed and stable, IBP 111/52, HR 48, Pox 99%  Last Vitals:  Vitals Value Taken Time  BP 98/50 10/30/21 1504  Temp    Pulse 50 10/30/21 1505  Resp 12 10/30/21 1505  SpO2 92 % 10/30/21 1505  Vitals shown include unvalidated device data.  Last Pain:  Vitals:   10/30/21 1031  TempSrc: Oral         Complications: No notable events documented.

## 2021-10-30 NOTE — Anesthesia Postprocedure Evaluation (Signed)
Anesthesia Post Note  Patient: TEJAH BREKKE  Procedure(s) Performed: TRANSCATHETER AORTIC VALVE REPLACEMENT, TRANSFEMORAL INTRAOPERATIVE TRANSTHORACIC ECHOCARDIOGRAM     Patient location during evaluation: PACU Anesthesia Type: MAC Level of consciousness: awake and alert Pain management: pain level controlled Vital Signs Assessment: post-procedure vital signs reviewed and stable Respiratory status: spontaneous breathing, nonlabored ventilation and respiratory function stable Cardiovascular status: stable, blood pressure returned to baseline and bradycardic Anesthetic complications: no   No notable events documented.  Last Vitals:  Vitals:   10/30/21 1540 10/30/21 1555  BP: (!) 106/41 (!) 103/36  Pulse: (!) 47 (!) 44  Resp: 17 (!) 21  Temp:    SpO2: 99% 97%    Last Pain:  Vitals:   10/30/21 1534  TempSrc: Temporal  PainSc:                  Audry Pili

## 2021-10-30 NOTE — CV Procedure (Signed)
[facility]  TAVR OPERATIVE NOTE   Date of Procedure:  10/30/2021  Preoperative Diagnosis: Severe Aortic Stenosis   Postoperative Diagnosis: Same   Procedure:   Transcatheter Aortic Valve Replacement - Transfemoral Approach  Edwards Sapien 3 Ultra Resilia THV (size 23 mm, model # [id number], serial # [id number])   Co-Surgeons:  Lauree Chandler, MD and Gaye Pollack, MD   Anesthesiologist:  Fransisco Beau  Echocardiographer:  Gasper Sells  Pre-operative Echo Findings: Severe aortic stenosis Normal left ventricular systolic function  Post-operative Echo Findings: No paravalvular leak Normal left ventricular systolic function  BRIEF CLINICAL NOTE AND INDICATIONS FOR SURGERY   81 yo female with history of chronic systolic heart failure, non-ischemic cardiomyopathy, persistent atrial fibrillation on Eliquis s/p atrial fibrillation ablation in 2018, sleep apnea on CPAP, CKD stage III, iron deficiency anemia, hyperlipidemia, depression and severe aortic stenosis. She is on lifelong Eliquis for atrial fibrillation and has been followed by Dr. Rayann Heman. 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 She has been followed for moderate aortic stenosis. Echo 08/16/21 with LVEF=60-65%. Severe aortic stenosis with thickened and calcified leaflets, mean gradient 32 mmHg, peak gradient 57.8 mmHg, AVA 0.52 cm2, dimensionless index 0.17. Cardiac cath with mild CAD. She continues to have progressive dyspnea, fatigue and chest pressure.   During the course of the patient's preoperative work up they have been evaluated comprehensively by a multidisciplinary team of specialists coordinated through the [facility] in the [facility].  They have been demonstrated to suffer from symptomatic severe aortic stenosis as noted above. The patient has been counseled extensively as to the relative risks and benefits of all options for the treatment  of severe aortic stenosis including long term medical therapy, conventional surgery for aortic valve replacement, and transcatheter aortic valve replacement.  The patient has been independently evaluated by Dr. Cyndia Bent with CT surgery and they are felt to be at high risk for conventional surgical aortic valve replacement. The surgeon indicated the patient would be a poor candidate for conventional surgery. Based upon review of all of the patient's preoperative diagnostic tests they are felt to be candidate for transcatheter aortic valve replacement using the transfemoral approach as an alternative to high risk conventional surgery.    Following the decision to proceed with transcatheter aortic valve replacement, a discussion has been held regarding what types of management strategies would be attempted intraoperatively in the event of life-threatening complications, including whether or not the patient would be considered a candidate for the use of cardiopulmonary bypass and/or conversion to open sternotomy for attempted surgical intervention.  The patient has been advised of a variety of complications that might develop peculiar to this approach including but not limited to risks of death, stroke, paravalvular leak, aortic dissection or other major vascular complications, aortic annulus rupture, device embolization, cardiac rupture or perforation, acute myocardial infarction, arrhythmia, heart block or bradycardia requiring permanent pacemaker placement, congestive heart failure, respiratory failure, renal failure, pneumonia, infection, other late complications related to structural valve deterioration or migration, or other complications that might ultimately cause a temporary or permanent loss of functional independence or other long term morbidity.  The patient provides full informed consent for the procedure as described and all questions were answered preoperatively.    DETAILS OF THE OPERATIVE  PROCEDURE  PREPARATION:   The patient is brought to the operating room on the above mentioned date and central monitoring was established by the anesthesia team including placement of a  radial arterial line. The patient is placed in the supine position on the operating table.  Intravenous antibiotics are administered. Conscious sedation is used.   Baseline transthoracic echocardiogram was performed. The patient's chest, abdomen, both groins, and both lower extremities are prepared and draped in a sterile manner. A time out procedure is performed.   PERIPHERAL ACCESS:   Using the modified Seldinger technique, femoral arterial and venous access were obtained with placement of a 6 Fr sheath in the artery and a 7 Fr sheath in the vein on the left side using u/s guidance.  A pigtail diagnostic catheter was passed through the femoral arterial sheath under fluoroscopic guidance into the aortic root.  A temporary transvenous pacemaker catheter was passed through the femoral venous sheath under fluoroscopic guidance into the right ventricle.  The pacemaker was tested to ensure stable lead placement and pacemaker capture. Aortic root angiography was performed in order to determine the optimal angiographic angle for valve deployment.  TRANSFEMORAL ACCESS:  A micropuncture kit was used to gain access to the right femoral artery using u/s guidance. Position confirmed with angiography. Pre-closure with double ProGlide closure devices. The patient was heparinized systemically and ACT verified > 250 seconds.    A 14 Fr transfemoral E-sheath was introduced into the right femoral artery after progressively dilating over an Amplatz superstiff wire. An AL-1 catheter was used to direct a straight-tip exchange length wire across the native aortic valve into the left ventricle. This was exchanged out for a pigtail catheter and position was confirmed in the LV apex. Simultaneous LV and Ao pressures were recorded.  The pigtail  catheter was then exchanged for an Amplatz Extra-stiff wire in the LV apex.   TRANSCATHETER HEART VALVE DEPLOYMENT:  An Edwards Sapien 3 THV (size 23 mm) was prepared and crimped per manufacturer's guidelines, and the proper orientation of the valve is confirmed on the [employer] delivery system. The valve was advanced through the introducer sheath using normal technique until in an appropriate position in the abdominal aorta beyond the sheath tip. The balloon was then retracted and using the fine-tuning wheel was centered on the valve. The valve was then advanced across the aortic arch using appropriate flexion of the catheter. The valve was carefully positioned across the aortic valve annulus. The Commander catheter was retracted using normal technique. Once final position of the valve has been confirmed by angiographic assessment, the valve is deployed while temporarily holding ventilation and during rapid ventricular pacing to maintain systolic blood pressure < 50 mmHg and pulse pressure < 10 mmHg. The balloon inflation is held for >3 seconds after reaching full deployment volume. Once the balloon has fully deflated the balloon is retracted into the ascending aorta and valve function is assessed using TTE. There is felt to be no paravalvular leak and no central aortic insufficiency.  The patient's hemodynamic recovery following valve deployment is good.  The deployment balloon and guidewire are both removed. Echo demostrated acceptable post-procedural gradients, stable mitral valve function, and no AI.   PROCEDURE COMPLETION:  The sheath was then removed and closure devices were completed. Protamine was administered once femoral arterial repair was complete. The temporary pacemaker, pigtail catheters and femoral sheaths were removed with a Mynx closure device placed in the artery and manual pressure used for venous hemostasis.    The patient tolerated the procedure well and is transported to the  surgical intensive care in stable condition. There were no immediate intraoperative complications. All sponge instrument and needle counts  are verified correct at completion of the operation.   No blood products were administered during the operation.  The patient received a total of 80 mL of intravenous contrast during the procedure.  Lauree Chandler MD 10/30/2021 2:54 PM

## 2021-10-30 NOTE — Anesthesia Procedure Notes (Signed)
Arterial Line Insertion Start/End1/24/2023 12:00 PM Performed by: Rachael Darby T, CRNA  Preanesthetic checklist: patient identified, IV checked, site marked, risks and benefits discussed, surgical consent, monitors and equipment checked, pre-op evaluation, timeout performed and anesthesia consent Lidocaine 1% used for infiltration Left, radial was placed Catheter size: 20 G Hand hygiene performed , maximum sterile barriers used  and Seldinger technique used  Attempts: 2 Procedure performed without using ultrasound guided technique. Following insertion, Biopatch. Post procedure assessment: normal  Post procedure complications: unsuccessful attempts. Patient tolerated the procedure well with no immediate complications. Additional procedure comments: First attempt was unsuccessful as the catheter was unable to be passed over the guidewire.

## 2021-10-30 NOTE — Interval H&P Note (Signed)
History and Physical Interval Note:  10/30/2021 11:58 AM  Jenna [name]  has presented today for surgery, with the diagnosis of Severe Aortic Stenosis.  The various methods of treatment have been discussed with the patient and family. After consideration of risks, benefits and other options for treatment, the patient has consented to  Procedure(s): TRANSCATHETER AORTIC VALVE REPLACEMENT, TRANSFEMORAL (N/A) INTRAOPERATIVE TRANSTHORACIC ECHOCARDIOGRAM (N/A) as a surgical intervention.  The patient's history has been reviewed, patient examined, no change in status, stable for surgery.  I have reviewed the patient's chart and labs.  Questions were answered to the patient's satisfaction.     Gaye Pollack

## 2021-10-30 NOTE — Op Note (Signed)
[facility]   MULTIDISCIPLINARY HEART VALVE TEAM   TAVR OPERATIVE NOTE   Date of Procedure:  10/30/2021  Preoperative Diagnosis: Severe Aortic Stenosis   Postoperative Diagnosis: Same   Procedure:   Transcatheter Aortic Valve Replacement - Percutaneous Right Transfemoral Approach  Edwards Sapien 3 Ultra Resilia THV (size 23 mm, model # [id number], serial # [id number])   Co-Surgeons:  Gaye Pollack, MD and  Lauree Chandler, MD   Anesthesiologist:  Raechel Ache, MD  Echocardiographer:  Osborne Oman, MD  Pre-operative Echo Findings: Severe aortic stenosis Normal left ventricular systolic function  Post-operative Echo Findings: No paravalvular leak Normal left ventricular systolic function   BRIEF CLINICAL NOTE AND INDICATIONS FOR SURGERY  This 81 year old woman has stage D, severe, symptomatic aortic stenosis with New York Heart Association class II symptoms of exertional fatigue and shortness of breath consistent with chronic diastolic congestive heart failure.  I have personally reviewed her 2D echocardiogram, cardiac catheterization, and CTA studies.  Echocardiogram shows a severely calcified and thickened aortic valve with restricted leaflet mobility.  The mean gradient was 32 mmHg with a peak gradient of 58 mmHg and a valve area of 0.52 cm.  Stroke-volume index is low at 26.  There is mild to moderate aortic insufficiency.  Left ventricular ejection fraction is normal.  Cardiac catheterization showed mild nonobstructive coronary disease.  There is mild pulmonary hypertension with a PA pressure of 46/24 and a mean wedge pressure of 24.  I agree that aortic valve replacement is the best treatment for this patient to improve her symptoms and prevent progressive left ventricular deterioration.  Given her age and comorbid risk factors I think that transcatheter aortic valve replacement would be the best option for her.  Her gated cardiac CTA shows anatomy suitable for  TAVR using a SAPIEN 3 valve.  Her abdominal and pelvic CTA shows adequate pelvic vascular anatomy to allow transfemoral insertion.   The patient was counseled at length regarding treatment alternatives for management of severe symptomatic aortic stenosis. The risks and benefits of surgical intervention has been discussed in detail. Long-term prognosis with medical therapy was discussed. Alternative approaches such as conventional surgical aortic valve replacement, transcatheter aortic valve replacement, and palliative medical therapy were compared and contrasted at length. This discussion was placed in the context of the patient's own specific clinical presentation and past medical history. All of her questions have been addressed.    Following the decision to proceed with transcatheter aortic valve replacement, a discussion was held regarding what types of management strategies would be attempted intraoperatively in the event of life-threatening complications, including whether or not the patient would be considered a candidate for the use of cardiopulmonary bypass and/or conversion to open sternotomy for attempted surgical intervention.  She is 81 years old but I think she would be a candidate for emergent sternotomy to manage any intraoperative complications.  The patient is aware of the fact that transient use of cardiopulmonary bypass may be necessary. The patient has been advised of a variety of complications that might develop including but not limited to risks of death, stroke, paravalvular leak, aortic dissection or other major vascular complications, aortic annulus rupture, device embolization, cardiac rupture or perforation, mitral regurgitation, acute myocardial infarction, arrhythmia, heart block or bradycardia requiring permanent pacemaker placement, congestive heart failure, respiratory failure, renal failure, pneumonia, infection, other late complications related to structural valve deterioration  or migration, or other complications that might ultimately cause a temporary or permanent loss of functional  independence or other long term morbidity. The patient provides full informed consent for the procedure as described and all questions were answered.    DETAILS OF THE OPERATIVE PROCEDURE  PREPARATION:    The patient was brought to the operating room on the above mentioned date and appropriate monitoring was established by the anesthesia team. The patient was placed in the supine position on the operating table.  Intravenous antibiotics were administered. The patient was monitored closely throughout the procedure under conscious sedation.   Baseline transthoracic echocardiogram was performed. The patient's abdomen and both groins were prepped and draped in a sterile manner. A time out procedure was performed.   PERIPHERAL ACCESS:    Using the modified Seldinger technique, femoral arterial and venous access was obtained with placement of 6 Fr sheaths on the left side.  A pigtail diagnostic catheter was passed through the left arterial sheath under fluoroscopic guidance into the aortic root.  A temporary transvenous pacemaker catheter was passed through the left femoral venous sheath under fluoroscopic guidance into the right ventricle.  The pacemaker was tested to ensure stable lead placement and pacemaker capture. Aortic root angiography was performed in order to determine the optimal angiographic angle for valve deployment.   TRANSFEMORAL ACCESS:   Percutaneous transfemoral access and sheath placement was performed using ultrasound guidance.  The right common femoral artery was cannulated using a micropuncture needle and appropriate location was verified using hand injection angiogram.  A pair of Abbott Perclose percutaneous closure devices were placed and a 6 French sheath replaced into the femoral artery.  The patient was heparinized systemically and ACT verified > 250 seconds.    A 14  Fr transfemoral E-sheath was introduced into the right common femoral artery after progressively dilating over an Amplatz superstiff wire. An AL-1 catheter was used to direct a straight-tip exchange length wire across the native aortic valve into the left ventricle. This was exchanged out for a pigtail catheter and position was confirmed in the LV apex. Simultaneous LV and Ao pressures were recorded.  The pigtail catheter was exchanged for an Amplatz Extra-stiff wire in the LV apex.   BALLOON AORTIC VALVULOPLASTY:   Not performed   TRANSCATHETER HEART VALVE DEPLOYMENT:   An Edwards Sapien 3 Ultra transcatheter heart valve (size 23 mm) was prepared and crimped per manufacturer's guidelines, and the proper orientation of the valve is confirmed on the [employer] delivery system. The valve was advanced through the introducer sheath using normal technique until in an appropriate position in the abdominal aorta beyond the sheath tip. The balloon was then retracted and using the fine-tuning wheel was centered on the valve. The valve was then advanced across the aortic arch using appropriate flexion of the catheter. The valve was carefully positioned across the aortic valve annulus. The Commander catheter was retracted using normal technique. Once final position of the valve has been confirmed by angiographic assessment, the valve is deployed while temporarily holding ventilation and during rapid ventricular pacing to maintain systolic blood pressure < 50 mmHg and pulse pressure < 10 mmHg. The balloon inflation is held for >3 seconds after reaching full deployment volume. Once the balloon has fully deflated the balloon is retracted into the ascending aorta and valve function is assessed using echocardiography. There is felt to be no paravalvular leak and no central aortic insufficiency.  The patient's hemodynamic recovery following valve deployment is good.  The deployment balloon and guidewire are both  removed.    PROCEDURE COMPLETION:  The sheath was removed and femoral artery closure performed.  Protamine was administered once femoral arterial repair was complete. The temporary pacemaker, pigtail catheters and femoral sheaths were removed with manual pressure used for hemostasis.  A Mynx femoral closure device was utilized following removal of the diagnostic sheath in the left femoral artery.  The patient tolerated the procedure well and is transported to the cath lab recovery area in stable condition. There were no immediate intraoperative complications. All sponge instrument and needle counts are verified correct at completion of the operation.   No blood products were administered during the operation.  The patient received a total of 80 mL of intravenous contrast during the procedure.   Gaye Pollack, MD 10/30/2021

## 2021-10-31 ENCOUNTER — Encounter (HOSPITAL_COMMUNITY): Payer: Self-pay | Admitting: Cardiovascular Disease

## 2021-10-31 ENCOUNTER — Inpatient Hospital Stay (HOSPITAL_COMMUNITY): Payer: Medicare PPO

## 2021-10-31 DIAGNOSIS — Z952 Presence of prosthetic heart valve: Secondary | ICD-10-CM | POA: Diagnosis not present

## 2021-10-31 DIAGNOSIS — I97630 Postprocedural hematoma of a circulatory system organ or structure following a cardiac catheterization: Secondary | ICD-10-CM

## 2021-10-31 DIAGNOSIS — I350 Nonrheumatic aortic (valve) stenosis: Secondary | ICD-10-CM

## 2021-10-31 LAB — ECHOCARDIOGRAM COMPLETE
AR max vel: 2.68 cm2
AV Area VTI: 2.44 cm2
AV Area mean vel: 2.59 cm2
AV Mean grad: 15 mmHg
AV Peak grad: 25.8 mmHg
Ao pk vel: 2.54 m/s
Area-P 1/2: 4.57 cm2
Height: 64 in
S' Lateral: 2.8 cm
Weight: 3030 oz

## 2021-10-31 LAB — BASIC METABOLIC PANEL
Anion gap: 7 (ref 5–15)
BUN: 21 mg/dL (ref 8–23)
CO2: 28 mmol/L (ref 22–32)
Calcium: 9 mg/dL (ref 8.9–10.3)
Chloride: 100 mmol/L (ref 98–111)
Creatinine, Ser: 0.89 mg/dL (ref 0.44–1.00)
GFR, Estimated: >60 mL/min (ref >60)
Glucose, Bld: 114 mg/dL — ABNORMAL HIGH (ref 70–99)
Potassium: 3.7 mmol/L (ref 3.5–5.1)
Sodium: 135 mmol/L (ref 135–145)

## 2021-10-31 LAB — CBC
HCT: 34.7 % — ABNORMAL LOW (ref 36.0–46.0)
Hemoglobin: 11.8 g/dL — ABNORMAL LOW (ref 12.0–15.0)
MCH: 30.8 pg (ref 26.0–34.0)
MCHC: 34 g/dL (ref 30.0–36.0)
MCV: 90.6 fL (ref 80.0–100.0)
Platelets: 209 10*3/uL (ref 150–400)
RBC: 3.83 MIL/uL — ABNORMAL LOW (ref 3.87–5.11)
RDW: 12.6 % (ref 11.5–15.5)
WBC: 7.5 10*3/uL (ref 4.0–10.5)
nRBC: 0 % (ref 0.0–0.2)

## 2021-10-31 LAB — MAGNESIUM: Magnesium: 1.9 mg/dL (ref 1.7–2.4)

## 2021-10-31 LAB — GLUCOSE, CAPILLARY
Glucose-Capillary: 140 mg/dL — ABNORMAL HIGH (ref 70–99)
Glucose-Capillary: 104 mg/dL — ABNORMAL HIGH (ref 70–99)

## 2021-10-31 NOTE — Progress Notes (Signed)
Echocardiogram 2D Echocardiogram has been performed.  Jenna [name] 10/31/2021, 8:50 AM

## 2021-10-31 NOTE — Discharge Summary (Addendum)
[facility] VALVE TEAM  Discharge Summary    Patient ID: Jenna [name] MRN: [id number]; DOB: [birth year]  Admit date: 10/30/2021 Discharge date: 10/31/2021  Primary Care Provider: Chesley Noon, MD  Primary Cardiologist: [name], MD /Dr. Angelena Form, MD/ Dr. Cyndia Bent, MD (TAVR)  Discharge Diagnoses    Principal Problem:   S/P TAVR (transcatheter aortic valve replacement) Active Problems:   Obstructive sleep apnea   HTN (hypertension)   Hypothyroidism   Persistent atrial fibrillation (HCC)   Normocytic anemia   GERD (gastroesophageal reflux disease)   Nonischemic cardiomyopathy (HCC)   Chronic kidney disease (CKD), stage III (moderate) (HCC)   Chronic back pain   Hyperlipidemia   Severe aortic valve stenosis   Postprocedural hematoma of a circulatory system organ or structure following a cardiac catheterization  Allergies Allergies  Allergen Reactions   Amoxicillin Nausea Only    850 mg dosage    Crestor [Rosuvastatin] Other (See Comments)    Joints ached   Lovastatin Other (See Comments)    Joints hurt all over body     Diagnostic Studies/Procedures    TAVR OPERATIVE NOTE     Date of Procedure:                10/30/2021   Preoperative Diagnosis:      Severe Aortic Stenosis    Postoperative Diagnosis:    Same    Procedure:        Transcatheter Aortic Valve Replacement - Percutaneous Right Transfemoral Approach             Edwards Sapien 3 Ultra Resilia THV (size 23 mm, model # 9755RSL, serial # [id number])              Co-Surgeons:                        Gaye Pollack, MD and  Lauree Chandler, MD     Anesthesiologist:                  Raechel Ache, MD   Echocardiographer:              Osborne Oman, MD   Pre-operative Echo Findings: Severe aortic stenosis Normal left ventricular systolic function   Post-operative Echo Findings: No paravalvular leak Normal left ventricular systolic function    _____________   Echo 10/31/21: Completed but pending formal read at the time of discharge   History of Present Illness     Jenna [name] is a 81 y.o. female with a history of chronic systolic heart failure, non-ischemic cardiomyopathy, persistent atrial fibrillation on Eliquis s/p atrial fibrillation ablation in 2018, sleep apnea on CPAP, CKD stage III, iron deficiency anemia, hyperlipidemia, depression and severe aortic stenosis who was seen by Dr. Rockne Menghini for the evaluation of aortic stenosis and possible TAVR. She was noted to be on lifelong Eliquis for atrial fibrillation and follows with Dr. Rayann Heman. Her cardiomyopathy was felt to be tachycardia mediated. She has been followed for moderate aortic stenosis with echocardiogram 08/16/21 showed LVEF at 60-65% with severe aortic stenosis with thickened and calcified leaflets, mean gradient 32 mmHg, peak gradient 57.8 mmHg, AVA 0.52 cm2, dimensionless index 0.17.   On Dr. Camillia Herter evaluation, she reported progressive dyspnea, fatigue and chest pressure. She had no dizziness or syncope. She is maintained PO Lasix for chronic LE edema.  She lives in [place] alone and is a widow although has support from  her daughter. 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 Plan was to pursue further TAVR workup with cardiac catheterization. This was performed 09/17/21 which showed mild, non-obstructive CAD with mild pulm HTN and confirmation of severe AS with a  mean gradient 27.3 mmHg, peak to peak gradient 32 mmHg, AVA 0.66 cm2.   The patient was evaluated by the multidisciplinary valve team including Dr. Cyndia Bent with TCTS and felt to have severe, symptomatic aortic stenosis and to be a suitable candidate for TAVR, which was set up for 10/30/21.    Hospital Course     Severe AS: s/p successful TAVR with a 23 mm Edwards Sapien 3 Ultra Resilia THV via the TF approach on 10/30/21. Post operative echo pending. Groin sites are stable.  ECG with NSR and no high grade heart block. Continue Eliquis monotherapy, restarting this evening 10/31/21. SBE to be RX'ed at follow up next week. She has ambulated with CR without difficulty. Post procedure groin site care and restrictions reviewed with patient understanding.   Chronic systolic heart failure/non-ischemic cardiomyopathy: Normalized LVEF since 2018 on echocardiogram with EF at 60-65% from 20-25%. Appears euvolemic on exam today. Continue current dose of Lasix. Creatinine and electrolytes stable today.   Persistent atrial fibrillation on Eliquis s/p atrial fibrillation ablation in 2018: Maintaining NSR on telemetry. Will restart Eliqius this evening. CBC stable.   Right wrist hematoma: Arterial US performed yesterday which showed small post cath hematoma. Plan to continue monitoring. There was no pseudoaneurysm.   Sleep apnea on CPAP: Resume as previously directed   CKD stage III: Creatinine stable post procedure at 0.89  Hyperlipidemia: Last LDL 69. Continue statin   Incidental findings: Tiny 0.2 cm solid right lower lobe pulmonary nodule. No follow-up needed if patient is low-risk. Non-contrast chest CT can be considered in 12 months if patient is high-risk.   Consultants: None    The patient was seen and examined by Dr. Angelena Form who feels that she is stable and ready for discharge today.  _____________  Discharge Vitals Blood pressure 117/75, pulse 81, temperature 98 F (36.7 C), temperature source Oral, resp. rate 16, height 5\' 4"  (1.626 m), weight 85.9 kg, SpO2 100 %.  Filed Weights   10/30/21 1031 10/31/21 0449  Weight: 83.9 kg 85.9 kg   General: Well developed, well nourished, NAD Skin: Warm, dry, intact  Neck: Negative for carotid bruits. No JVD Lungs:Clear to ausculation bilaterally. No wheezes, rales, or rhonchi. Breathing is unlabored. Cardiovascular: RRR with S1 S2. Soft flow murmur Extremities: No edema. Neuro: Alert and oriented. No focal deficits. No  facial asymmetry. MAE spontaneously. Psych: Responds to questions appropriately with normal affect.    Labs & Radiologic Studies    CBC Recent Labs    10/30/21 1517 10/31/21 0052  WBC  --  7.5  HGB 12.6 11.8*  HCT 37.0 34.7*  MCV  --  90.6  PLT  --  250   Basic Metabolic Panel Recent Labs    10/30/21 1517 10/31/21 0052  NA 138 135  K 3.8 3.7  CL 98 100  CO2  --  28  GLUCOSE 152* 114*  BUN 25* 21  CREATININE 0.80 0.89  CALCIUM  --  9.0  MG  --  1.9   Liver Function Tests No results for input(s): AST, ALT, ALKPHOS, BILITOT, PROT, ALBUMIN in the last 72 hours. No results for input(s): LIPASE, AMYLASE in the last 72 hours. Cardiac Enzymes No results for input(s): CKTOTAL, CKMB, CKMBINDEX,  TROPONINI in the last 72 hours. BNP Invalid input(s): POCBNP D-Dimer No results for input(s): DDIMER in the last 72 hours. Hemoglobin A1C No results for input(s): HGBA1C in the last 72 hours. Fasting Lipid Panel No results for input(s): CHOL, HDL, LDLCALC, TRIG, CHOLHDL, LDLDIRECT in the last 72 hours. Thyroid Function Tests No results for input(s): TSH, T4TOTAL, T3FREE, THYROIDAB in the last 72 hours.  Invalid input(s): FREET3 _____________  DG Chest 2 View  Result Date: 10/27/2021 CLINICAL DATA:  Preoperative evaluation. EXAM: CHEST - 2 VIEW COMPARISON:  April 16, 2020 FINDINGS: The heart size and mediastinal contours are within normal limits. A radiopaque loop recorder device is seen. The lungs are mildly hyperinflated. Both lungs are clear. The visualized skeletal structures are unremarkable. IMPRESSION: No active cardiopulmonary disease. Electronically Signed   By: Virgina Norfolk M.D.   On: 10/27/2021 03:58   VAS [place] UPPER EXTREMITY ARTERIAL DUPLEX  Result Date: 10/30/2021  UPPER EXTREMITY DUPLEX STUDY Patient Name:  KALISE FICKETT  Date of Exam:   10/30/2021 Medical Rec #: [id number]     Accession #:    [id number] Date of Birth: 25-Feb-1941     Patient Gender: F Patient Age:   14  years Exam Location:  [facility] Procedure:      VAS [place] UPPER EXTREMITY ARTERIAL DUPLEX Referring Phys: JILL MCDANIEL --------------------------------------------------------------------------------  Indications: Palpable knot. History:     Patient has a history of catheterization via right radial artery.  Comparison Study: No prior study Performing Technologist: Maudry Mayhew MHA, RDMS, RVT, RDCS  Examination Guidelines: A complete evaluation includes B-mode imaging, spectral Doppler, color Doppler, and power Doppler as needed of all accessible portions of each vessel. Bilateral testing is considered an integral part of a complete examination. Limited examinations for reoccurring indications may be performed as noted.  Right Doppler Findings: +---------------+----------+-------------------+-------------+-----------------+  Site            PSV (cm/s) Waveform            Stenosis      Comments           +---------------+----------+-------------------+-------------+-----------------+  Subclavian Dist            triphasic                                            +---------------+----------+-------------------+-------------+-----------------+  Radial Prox     124        triphasic                                            +---------------+----------+-------------------+-------------+-----------------+  Radial Mid      73         triphasic                                            +---------------+----------+-------------------+-------------+-----------------+  Radial Dist     301        hyperemic triphasic >50% stenosis Hematoma  compressing                                                                      artery             +---------------+----------+-------------------+-------------+-----------------+  Ulnar Prox      76         triphasic                                             +---------------+----------+-------------------+-------------+-----------------+  Ulnar Mid       84         triphasic                                            +---------------+----------+-------------------+-------------+-----------------+  Ulnar Dist      79         hyperemic triphasic                                  +---------------+----------+-------------------+-------------+-----------------+    Summary:  Right: 1.6 x 1.1 x 1.5cm heterogenous area visualized in the distal        right upper extremity, adjacent to the radial artery in the        area of recent cardiac catheterization with no internal        vascularity. This is suggestive of a hematoma. >50% stenosis        visualized in the right radial artery, likely related to        hematoma compressing radial artery. *See table(s) above for measurements and observations. Electronically signed by Deitra Mayo MD on 10/30/2021 at 9:05:44 PM.    Final    ECHOCARDIOGRAM LIMITED  Result Date: 10/30/2021    ECHOCARDIOGRAM LIMITED REPORT   Patient Name:   SAVEAH BAHAR Date of Exam: 10/30/2021 Medical Rec #:  [id number]    Height:       64.0 in Accession #:    [id number]   Weight:       186.4 lb Date of Birth:  [birth year]    BSA:          1.899 m Patient Age:    81 years     BP:           139/46 mmHg Patient Gender: F            HR:           65 bpm. Exam Location:  Inpatient Procedure: Limited Echo, Color Doppler and Cardiac Doppler Indications:     Aortic Stenosis i35.0  History:         Patient has prior history of Echocardiogram examinations, most                  recent 08/16/2021. CHF; Risk Factors:Hypertension, Diabetes,                  Dyslipidemia and Sleep Apnea.  Sonographer:     Raquel Sarna Senior  RDCS Referring Phys:  [place] Diagnosing Phys: Rudean Haskell MD  Sonographer Comments: 3mm Edwards S3U TAVR Implanted IMPRESSIONS  1. Interventional TTE.  2. Prior to the procedure, the aortic valve was calcified-  functionally bicuspid. Aortic valve regurgitation is mild and central in nature. Severe aortic stenosis: mean gradient 36 mm Hg, Peak gradient 55 mm Hg, AVA 0.55 cm2, DVI 0.2.  3. Post procedure, the aortic valve was replaced by a 23 mm Edwards Sapien Valve. No paravalvular leak confirmed after wire removal. Normal prosthetic function: mean gradient 5 mm Hg, peak gradient 10 mm Hg, EOA 1.66 cm2 and DVI 0.46.  4. Left ventricular ejection fraction, by estimation, is 60 to 65%. The left ventricle has normal function.  5. Right ventricular systolic function is normal.  6. The mitral valve is grossly normal. Trivial mitral valve regurgitation. Comparison(s): Well seated prosthetic valve. FINDINGS  Left Ventricle: Left ventricular ejection fraction, by estimation, is 60 to 65%. The left ventricle has normal function. Right Ventricle: Right ventricular systolic function is normal. Pericardium: Trivial pericardial effusion is present. Mitral Valve: The mitral valve is grossly normal. Trivial mitral valve regurgitation. Aortic Valve: The aortic valve is calcified. Aortic valve regurgitation is mild. Aortic valve mean gradient measures 26.7 mmHg. Aortic valve peak gradient measures 37.3 mmHg. Aortic valve area, by VTI measures 0.71 cm. LEFT VENTRICLE PLAX 2D LVOT diam:     2.00 cm LV SV:         64 LV SV Index:   34 LVOT Area:     3.14 cm  AORTIC VALVE AV Area (Vmax):    0.76 cm AV Area (Vmean):   0.65 cm AV Area (VTI):     0.71 cm AV Vmax:           305.33 cm/s AV Vmean:          228.333 cm/s AV VTI:            0.898 m AV Peak Grad:      37.3 mmHg AV Mean Grad:      26.7 mmHg LVOT Vmax:         73.80 cm/s LVOT Vmean:        47.300 cm/s LVOT VTI:          0.203 m LVOT/AV VTI ratio: 0.23  SHUNTS Systemic VTI:  0.20 m Systemic Diam: 2.00 cm Rudean Haskell MD Electronically signed by Rudean Haskell MD Signature Date/Time: 10/30/2021/5:17:51 PM    Final    Structural Heart Procedure  Result Date:  10/30/2021 Severe aortic stenosis Successful placement of a 23 mm Edwards Sapien 3 Ultra Resilia THV from the right transfemoral approach. See full operative note in the Progress Notes section of the EMR   Disposition   Pt is being discharged home today in good condition.  Follow-up Plans & Appointments    Follow-up Information     Eileen Stanford, PA-C. Go on 11/09/2021.   Specialties: Cardiology, Radiology Why: @ 11am, please arrive at least 10 minutes early. Contact information: [place] [address] [phone]                Discharge Instructions     Call MD for:  difficulty breathing, headache or visual disturbances   Complete by: As directed    Call MD for:  extreme fatigue   Complete by: As directed    Call MD for:  hives   Complete by: As directed  Call MD for:  persistant dizziness or light-headedness   Complete by: As directed    Call MD for:  persistant nausea and vomiting   Complete by: As directed    Call MD for:  redness, tenderness, or signs of infection (pain, swelling, redness, odor or green/yellow discharge around incision site)   Complete by: As directed    Call MD for:  severe uncontrolled pain   Complete by: As directed    Call MD for:  temperature >100.4   Complete by: As directed    Diet - low sodium heart healthy   Complete by: As directed    Discharge instructions   Complete by: As directed    You may restart your Eliquis tonight, 10/31/21 for your evening dose.    ACTIVITY AND EXERCISE  Daily activity and exercise are an important part of your recovery. People recover at different rates depending on their general health and type of valve procedure.  Most people recovering from TAVR feel better relatively quickly   No lifting, pushing, pulling more than 10 pounds (examples to avoid: groceries, vacuuming, gardening, golfing):             - For one week with a procedure through the groin.              - For six weeks for procedures through the chest wall or neck. NOTE: You will typically see one of our providers 7-14 days after your procedure to discuss [place] the above activities.      DRIVING  Do not drive until you are seen for follow up and cleared by a provider. Generally, we ask patient to not drive for 1 week after their procedure.  If you have been told by your doctor in the past that you may not drive, you must talk with him/her before you begin driving again.   DRESSING  Groin site: you may leave the clear dressing over the site for up to one week or until it falls off.   HYGIENE  If you had a femoral (leg) procedure, you may take a shower when you return home. After the shower, pat the site dry. Do NOT use powder, oils or lotions in your groin area until the site has completely healed.  If you had a chest procedure, you may shower when you return home unless specifically instructed not to by your discharging practitioner.             - DO NOT scrub incision; pat dry with a towel.             - DO NOT apply any lotions, oils, powders to the incision.             - No tub baths / swimming for at least 2 weeks.  If you notice any fevers, chills, increased pain, swelling, bleeding or pus, please contact your doctor.   ADDITIONAL INFORMATION  If you are going to have an upcoming dental procedure, please contact our office as you will require antibiotics ahead of time to prevent infection on your heart valve.    If you have any questions or concerns you can call the structural heart phone during normal business hours 8am-4pm. If you have an urgent need after hours or weekends please call [phone] to talk to the on call provider for general cardiology. If you have an emergency that requires immediate attention, please call 911.    After TAVR Checklist  Check  Test  Description  Follow up appointment in 1-2 weeks  You will see our structural heart advanced  practice provider. Your incision sites will be checked and you will be cleared to drive and resume all normal activities if you are doing well.    1 month echo and follow up  You will have an echo to check on your new heart valve and be seen back in the office by a structural heart advanced practice provider.  Follow up with your primary cardiologist You will need to be seen by your primary cardiologist in the following 3-6 months after your 1 month appointment in the valve clinic.   1 year echo and follow up You will have another echo to check on your heart valve after 1 year and be seen back in the office by a structural heart advanced practice provider. This your last structural heart visit.  Bacterial endocarditis prophylaxis  You will have to take antibiotics for the rest of your life before all dental procedures (even teeth cleanings) to protect your heart valve. Antibiotics are also required before some surgeries. Please check with your cardiologist before scheduling any surgeries. Also, please make sure to tell us if you have a penicillin allergy as you will require an alternative antibiotic.   If the dressing is still on your incision site when you go home, remove it on the third day after your surgery date. Remove dressing if it begins to fall off, or if it is dirty or damaged before the third day.   Complete by: As directed    Increase activity slowly   Complete by: As directed       Discharge Medications   Allergies as of 10/31/2021       Reactions   Amoxicillin Nausea Only   850 mg dosage    Crestor [rosuvastatin] Other (See Comments)   Joints ached   Lovastatin Other (See Comments)   Joints hurt all over body         Medication List     TAKE these medications    acetaminophen 500 MG tablet Commonly known as: TYLENOL Take 1,000 mg by mouth every 6 (six) hours as needed for moderate pain.   ALPRAZolam 0.5 MG tablet Commonly known as: XANAX Take 0.5 mg by mouth 2 (two)  times daily.   atorvastatin 20 MG tablet Commonly known as: LIPITOR Take 1 tablet (20 mg total) by mouth every Monday, Wednesday, and Friday.   CALCIUM 600 + D PO Take 1 tablet by mouth 2 (two) times daily with a meal.   carvedilol 6.25 MG tablet Commonly known as: COREG Take 1 tablet (6.25 mg total) by mouth 2 (two) times daily with a meal.   diphenhydramine-acetaminophen 25-500 MG Tabs tablet Commonly known as: TYLENOL PM Take 2 tablets by mouth at bedtime as needed (sleep).   Eliquis 5 MG Tabs tablet Generic drug: apixaban TAKE 1 TABLET BY MOUTH TWICE A DAY   furosemide 40 MG tablet Commonly known as: LASIX Take 1 tablet (40 mg total) by mouth daily.   levothyroxine 88 MCG tablet Commonly known as: SYNTHROID Take 88 mcg by mouth daily before breakfast.   loratadine 10 MG tablet Commonly known as: CLARITIN Take 10 mg by mouth daily.   Magnesium 500 MG Tabs Take 500 mg by mouth daily.   metolazone 2.5 MG tablet Commonly known as: ZAROXOLYN Take 2.5 mg by mouth daily as needed for edema.   omeprazole 40 MG capsule Commonly known as: PRILOSEC Take 40  mg by mouth every evening.   OneTouch Ultra test strip Generic drug: glucose blood   PreserVision AREDS 2 Caps Take 1 capsule by mouth 2 (two) times daily.   pyridOXINE 100 MG tablet Commonly known as: VITAMIN B-6 Take 100 mg by mouth daily with supper.   rOPINIRole 0.5 MG tablet Commonly known as: REQUIP Take 0.5-1 mg by mouth See admin instructions. Take 0.5 mg at lunch, 0.5 mg at supper, and 1 mg at bedtime   spironolactone 25 MG tablet Commonly known as: ALDACTONE Take 0.5 tablets (12.5 mg total) by mouth daily.   SYSTANE OP Place 1-2 drops into both eyes 3 (three) times daily.   Systane Overnight Therapy 0.3 % Gel ophthalmic ointment Generic drug: hypromellose Place 2 application into both eyes at bedtime.   zinc gluconate 50 MG tablet Take 50 mg by mouth daily.               Discharge  Care Instructions  (From admission, onward)           Start     Ordered   10/31/21 0000  If the dressing is still on your incision site when you go home, remove it on the third day after your surgery date. Remove dressing if it begins to fall off, or if it is dirty or damaged before the third day.        10/31/21 1204            Outstanding Labs/Studies   None   Duration of Discharge Encounter   Greater than 30 minutes including physician time.  SignedKathyrn Drown, NP 10/31/2021, 12:06 PM [phone]   I have personally seen and examined this patient. I agree with the assessment and plan as outlined above.  Pt doing well post TAVR. Echo with no PVL. Mild gradient expected. Groins stable. BP stable. Discharge home.   Lauree Chandler 11/01/2021 7:27 AM

## 2021-10-31 NOTE — Progress Notes (Signed)
Discharge instructions provided to patient. All medications, follow up appointments, and discharge instructions discussed. IV out. Monitor off CCMD notified. Discharging home with family.  Era Bumpers, RN

## 2021-10-31 NOTE — Progress Notes (Signed)
CARDIAC REHAB PHASE I   Went to offer to walk with pt, recent return from walk with mobility team. Pt educated on importance of site care and restrictions. Encouraged continued ambulation with emphasis on safety. Pt declines questions or concerns. Declines CRP II at this time. Hopeful for d/c today.  [phone] Jenna Falco, RN BSN 10/31/2021 11:15 AM

## 2021-10-31 NOTE — Progress Notes (Signed)
Mobility Specialist: Progress Note   10/31/21 1125  Mobility  Bed Position Chair  Activity Ambulated with assistance in hallway  Level of Assistance Minimal assist, patient does 75% or more  Assistive Device Front wheel walker  Distance Ambulated (ft) 470 ft  Activity Response Tolerated well  $Mobility charge 1 Mobility   Pre-Mobility: 72 HR During Mobility: 124 HR Post-Mobility: 80 HR, 117/75 BP, 96% SpO2  Received pt in bed having no complaints and agreeable to mobility. Required minA to stand from EOB. Asymptomatic throughout ambulation, returned back to chair w/ call bell in reach and all needs met.   [facility] Briel Gallicchio Mobility Specialist Mobility Specialist [address] [place]: [phone] Mobility Specialist [address] [place] [address] [place]: [phone]

## 2021-11-01 ENCOUNTER — Telehealth: Payer: Self-pay | Admitting: Physician Assistant

## 2021-11-01 NOTE — Telephone Encounter (Signed)
°  [facility] VALVE TEAM   Patient contacted regarding discharge from [facility] on 1/25  Patient understands to follow up with a structural heart APP on 2/3 at [place].  Patient understands discharge instructions? yes Patient understands medications and regimen? yes Patient understands to bring all medications to this visit? yes  Angelena Form PA-C  [facility]

## 2021-11-05 ENCOUNTER — Ambulatory Visit (INDEPENDENT_AMBULATORY_CARE_PROVIDER_SITE_OTHER): Payer: Medicare PPO

## 2021-11-05 DIAGNOSIS — I428 Other cardiomyopathies: Secondary | ICD-10-CM

## 2021-11-05 LAB — CUP PACEART REMOTE DEVICE CHECK
Date Time Interrogation Session: 20230129232521
Implantable Pulse Generator Implant Date: 20210322

## 2021-11-08 NOTE — Progress Notes (Signed)
[facility] [place]                                     Cardiology Office Note:    Date:  11/09/2021   ID:  Jenna [name], DOB [birth year], MRN [id number]  PCP:  Chesley Noon, MD  [facility] Cardiologist:  [name], MD / Dr. Angelena Form, MD & Dr. Cyndia Bent, MD (TAVR) [facility] Electrophysiologist:  None   Referring MD: Chesley Noon, MD   [facility] s/p TAVR  History of Present Illness:    Jenna [name] is a 81 y.o. 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  She has mild aortic stenosis and history of negative nuclear stress test in August 2013. 30 day monitor for palpitations showed Afib. TEE showed EF of 20-25% with no LV thrombus. She underwent atrial fibrillation ablation in April 2018 and maintained normal sinus rhythm.  Follow up echo showed improved EF with restoration of Afib. However due to recurrence of palpitations an ILR was implanted. ILR has not shown Afib but she has been reluctant to stop eliquis. Since then, Dr. Rayann Heman has recommended lifelong anticoagulation.   Echo 10/16/20 showed LVEF at 60-65% with progression to severe aortic stenosis with a mean gradient 32 mmHg, peak gradient 57.8 mmHg, AVA 0.52 cm2, dimensionless index 0.17. She was referred to structural heart and subsequent cardiac catheterization 09/17/21 showed mild, non-obstructive CAD with mild pulm HTN and confirmation of severe AS with a mean gradient 27.3 mmHg, peak to peak gradient 32 mmHg, AVA 0.66 cm2.    She was evaluated by the multidisciplinary valve team and underwent successful TAVR with a 23 mm Edwards Sapien 3 Ultra Resilia THV via the TF approach on 10/30/21.  Post operative echo showed EF 65-70%, mod concentric LVH, normally functioning TAVR with a mean gradient of 15 mmHg and no PVL. She was noted to have a lump on her right wrist where she had a cath but follow up US showed no PSA, just hematoma. She was discharged on home Eliquis.   Today the patient presents to clinic for follow up. No CP or SOB. No LE edema, orthopnea or PND. No dizziness or syncope. No blood in stool or urine. No palpitations. Energy is not back to her baseline yet.    Past Medical History:  Diagnosis Date   Anxiety    CHF (congestive heart failure) (HCC)    Chronic lower back pain    Chronic systolic dysfunction of left ventricle    Depression    Fatigue    GERD (gastroesophageal reflux disease)    High cholesterol    Hypertension    Hypothyroidism    Melanoma of shoulder, left (HCC)    Mild aortic stenosis    Nonischemic cardiomyopathy (HCC)    OSA on CPAP    reports compliance with CPAP   Osteoarthritis    Persistent atrial fibrillation with RVR ([place])    [name] 11/15/2016   PONV (postoperative nausea and vomiting)    RLS (restless legs syndrome)    S/P ablation of atrial flutter/ fib 01/10/17 01/11/2017   S/P TAVR (transcatheter aortic valve  replacement) 10/30/2021   s/p TAVR with a 23 mm Edwards S3UR via the TF appoach by Dr. Angelena Form & Dr. Cyndia Bent   Type II diabetes mellitus [facility])    no medications    Past Surgical History:  Procedure Laterality Date   ATRIAL FIBRILLATION ABLATION N/A 01/10/2017   Procedure: Atrial Fibrillation Ablation;  Surgeon: Rawley Harju Grayer, MD;  Location: [place] CV LAB;  Service: Cardiovascular;  Laterality: N/A;   CARDIOVERSION N/A 11/19/2016   Procedure: CARDIOVERSION;  Surgeon: Lelon Perla, MD;  Location: [facility] ENDOSCOPY;  Service: Cardiovascular;  Laterality: N/A;   CARDIOVERSION N/A 12/11/2016   Procedure: CARDIOVERSION;  Surgeon: Dorothy Spark, MD;  Location: [place];  Service: Cardiovascular;  Laterality: N/A;    CARDIOVERSION N/A 12/18/2016   Procedure: CARDIOVERSION;  Surgeon: Skeet Latch, MD;  Location: [facility] ENDOSCOPY;  Service: Cardiovascular;  Laterality: N/A;   CATARACT EXTRACTION W/ INTRAOCULAR LENS  IMPLANT, BILATERAL Bilateral    DILATION AND CURETTAGE OF UTERUS     implantable loop recorder placement  12/27/2019   Medtronic Reveal Linq model JXB 14 (SN [id number]) implanted in office by Dr Rayann Heman   INTRAOPERATIVE TRANSTHORACIC ECHOCARDIOGRAM N/A 10/30/2021   Procedure: INTRAOPERATIVE TRANSTHORACIC ECHOCARDIOGRAM;  Surgeon: Burnell Blanks, MD;  Location: [place] CV LAB;  Service: Open Heart Surgery;  Laterality: N/A;   LEXISCAN MYOVIEW  05/13/2012   No ECG changes. EKG negative for ischemia. No significant ischemia demonstrated.   MELANOMA EXCISION Left ~ 09/1979   "back of my shoulder"   RIGHT/LEFT HEART CATH AND CORONARY ANGIOGRAPHY N/A 09/17/2021   Procedure: RIGHT/LEFT HEART CATH AND CORONARY ANGIOGRAPHY;  Surgeon: Burnell Blanks, MD;  Location: [place] CV LAB;  Service: Cardiovascular;  Laterality: N/A;   TEE WITHOUT CARDIOVERSION N/A 11/19/2016   Procedure: TRANSESOPHAGEAL ECHOCARDIOGRAM (TEE);  Surgeon: Lelon Perla, MD;  Location: [facility] ENDOSCOPY;  Service: Cardiovascular;  Laterality: N/A;  need CV, but no anesthesia available   TEE WITHOUT CARDIOVERSION N/A 01/10/2017   Procedure: TRANSESOPHAGEAL ECHOCARDIOGRAM (TEE);  Surgeon: [name], MD;  Location: [facility] ENDOSCOPY;  Service: Cardiovascular;  Laterality: N/A;   TOTAL KNEE ARTHROPLASTY Right 07/28/2018   Procedure: RIGHT TOTAL KNEE ARTHROPLASTY;  Surgeon: Mcarthur Rossetti, MD;  Location: [place];  Service: Orthopedics;  Laterality: Right;   TRANSCATHETER AORTIC VALVE REPLACEMENT, TRANSFEMORAL N/A 10/30/2021   Procedure: TRANSCATHETER AORTIC VALVE REPLACEMENT, TRANSFEMORAL;  Surgeon: Burnell Blanks, MD;  Location: [place] CV LAB;  Service: Open Heart Surgery;  Laterality: N/A;    TRANSTHORACIC ECHOCARDIOGRAM  02/18/2012   EF >55%, mild aortic stenosis   VAGINAL HYSTERECTOMY  1985    Current Medications: Current Meds  Medication Sig   acetaminophen (TYLENOL) 500 MG tablet Take 1,000 mg by mouth every 6 (six) hours as needed for moderate pain.   ALPRAZolam (XANAX) 0.5 MG tablet Take 0.5 mg by mouth 2 (two) times daily.    amoxicillin (AMOXIL) 500 MG tablet Take 4 tablets by mouth 1 hour before dental procedures and cleanings   atorvastatin (LIPITOR) 20 MG tablet Take 1 tablet (20 mg total) by mouth every Monday, Wednesday, and Friday.   Calcium Carb-Cholecalciferol (CALCIUM 600 + D PO) Take 1 tablet by mouth 2 (two) times daily with a meal.   carvedilol (COREG) 6.25 MG tablet Take 1 tablet (6.25 mg total) by mouth 2 (two) times daily with a meal.   diphenhydramine-acetaminophen (TYLENOL PM) 25-500 MG TABS tablet Take 2 tablets by mouth at bedtime as needed (sleep).  ELIQUIS 5 MG TABS tablet TAKE 1 TABLET BY MOUTH TWICE A DAY   furosemide (LASIX) 40 MG tablet Take 1 tablet (40 mg total) by mouth daily.   hypromellose (SYSTANE OVERNIGHT THERAPY) 0.3 % GEL ophthalmic ointment Place 2 application into both eyes at bedtime.   levothyroxine (SYNTHROID, LEVOTHROID) 88 MCG tablet Take 88 mcg by mouth daily before breakfast.   loratadine (CLARITIN) 10 MG tablet Take 10 mg by mouth daily.   Magnesium 500 MG TABS Take 500 mg by mouth every Monday, Wednesday, and Friday.   metolazone (ZAROXOLYN) 2.5 MG tablet Take 2.5 mg by mouth daily as needed for edema.   Multiple Vitamins-Minerals (PRESERVISION AREDS 2) CAPS Take 1 capsule by mouth 2 (two) times daily.   omeprazole (PRILOSEC) 40 MG capsule Take 40 mg by mouth every evening.   ONETOUCH ULTRA test strip    Polyethyl Glycol-Propyl Glycol (SYSTANE OP) Place 1-2 drops into both eyes 3 (three) times daily.   pyridOXINE (VITAMIN B-6) 100 MG tablet Take 100 mg by mouth daily with supper.   rOPINIRole (REQUIP) 0.5 MG tablet Take  0.5-1 mg by mouth See admin instructions. Take 0.5 mg at lunch, 0.5 mg at supper, and 1 mg at bedtime   spironolactone (ALDACTONE) 25 MG tablet Take 0.5 tablets (12.5 mg total) by mouth daily.   zinc gluconate 50 MG tablet Take 50 mg by mouth daily.     Allergies:   Crestor [rosuvastatin] and Lovastatin   Social History   Socioeconomic History   Marital status: Widowed    Spouse name: Not on file   Number of children: 2   Years of education: Not on file   Highest education level: Not on file  Occupational History   Occupation: [employer]  Tobacco Use   Smoking status: Never   Smokeless tobacco: Never  Vaping Use   Vaping Use: Never used  Substance and Sexual Activity   Alcohol use: No   Drug use: Never   Sexual activity: Never  Other Topics Concern   Not on file  Social History Narrative   Not on file   Social Determinants of Health   Financial Resource Strain: Not on file  Food Insecurity: Not on file  Transportation Needs: Not on file  Physical Activity: Not on file  Stress: Not on file  Social Connections: Not on file     Family History: The patient's family history includes Arrhythmia in her sister; Cancer in her paternal grandfather; Diabetes in her mother; Healthy in her brother and daughter; Heart failure in her father; Hyperlipidemia in her sister and son; Hypertension in her mother, sister, and son; Stroke in her paternal grandmother.  ROS:   Please see the history of present illness.    All other systems reviewed and are negative.  EKGs/Labs/Other Studies Reviewed:    The following studies were reviewed today:  TAVR OPERATIVE NOTE     Date of Procedure:                10/30/2021   Preoperative Diagnosis:      Severe Aortic Stenosis    Postoperative Diagnosis:    Same    Procedure:        Transcatheter Aortic Valve Replacement - Percutaneous Right Transfemoral Approach             Edwards Sapien 3 Ultra Resilia THV (size 23  mm, model # 9755RSL, serial # [id number])  Co-Surgeons:                        Gaye Pollack, MD and  Lauree Chandler, MD     Anesthesiologist:                  Raechel Ache, MD   Echocardiographer:              Osborne Oman, MD   Pre-operative Echo Findings: Severe aortic stenosis Normal left ventricular systolic function   Post-operative Echo Findings: No paravalvular leak Normal left ventricular systolic function   _____________   Echo 10/31/21 IMPRESSIONS   1. The aortic valve has been replaced. Aortic valve regurgitation is not  visualized (no PVL). Effective orifice area index, by VTI measures 1.28  cm. Aortic valve mean gradient measures 15.0 mmHg. Peak gradient 28 mm  Hg. DVI 0.75. Acceleration time 90  ms.   2. Left ventricular ejection fraction, by estimation, is 65 to 70%. The  left ventricle has normal function. The left ventricle has no regional  wall motion abnormalities. There is moderate concentric left ventricular  hypertrophy. Left ventricular  diastolic parameters are consistent with Grade I diastolic dysfunction  (impaired relaxation).   3. Right ventricular systolic function is normal. The right ventricular  size is normal. There is normal pulmonary artery systolic pressure.   4. The mitral valve is normal in structure. No evidence of mitral valve  regurgitation. No evidence of mitral stenosis.   Comparison(s): Increase in gradients but stable prosthetic parameters.   ______________________  10/30/21 VAS [place] UE ARTERIAL DUPLEX Summary:     Right: 1.6 x 1.1 x 1.5cm heterogenous area visualized in the distal         right upper extremity, adjacent to the radial artery in the         area of recent cardiac catheterization with no internal         vascularity. This is suggestive of a hematoma. >50% stenosis         visualized in the right radial artery, likely related to         hematoma compressing radial artery.   EKG:  EKG is   ordered today.  The ekg ordered today demonstrates sinus brady with 1st deg AV block w/ PACs HR 59  Recent Labs: 10/26/2021: ALT 23; B Natriuretic Peptide 123.6 10/31/2021: BUN 21; Creatinine, Ser 0.89; Hemoglobin 11.8; Magnesium 1.9; Platelets 209; Potassium 3.7; Sodium 135  Recent Lipid Panel    Component Value Date/Time   CHOL 170 02/11/2017 0941   TRIG 93 02/11/2017 0941   HDL 82 02/11/2017 0941   CHOLHDL 2.1 02/11/2017 0941   VLDL 19 02/11/2017 0941   LDLCALC 69 02/11/2017 0941     Risk Assessment/Calculations:    CHA2DS2-VASc Score = 4   This indicates a 4.8% annual risk of stroke. The patient's score is based upon: CHF History: 1 HTN History: 0 Diabetes History: 0 Stroke History: 0 Vascular Disease History: 0 Age Score: 2 Gender Score: 1      Physical Exam:    VS:  BP 130/68    Pulse (!) 59    Ht 5\' 5"  (1.651 m)    Wt 189 lb (85.7 kg)    SpO2 99%    BMI 31.45 kg/m     Wt Readings from Last 3 Encounters:  11/09/21 189 lb (85.7 kg)  10/31/21 189 lb 6 oz (85.9 kg)  10/26/21 186 lb 6.4  oz (84.6 kg)     GEN:  Well nourished, well developed in no acute distress HEENT: Normal NECK: No JVD LYMPHATICS: No lymphadenopathy CARDIAC: RRR, no murmurs, rubs, gallops RESPIRATORY:  Clear to auscultation without rales, wheezing or rhonchi  ABDOMEN: Soft, non-tender, non-distended MUSCULOSKELETAL:  No edema; No deformity  SKIN: Warm and dry  Groin sites clear without hematoma or ecchymosis. Quarter sized lump on right wrist. Non tender.  NEUROLOGIC:  Alert and oriented x 3 PSYCHIATRIC:  Normal affect   ASSESSMENT:    1. S/P TAVR (transcatheter aortic valve replacement)   2. Nonischemic cardiomyopathy (HCC)   3. Persistent atrial fibrillation ([place])   4. Traumatic hematoma of wrist, sequela   5. Hyperlipidemia, unspecified hyperlipidemia type   6. Pulmonary nodule    PLAN:    In order of problems listed above:  Severe AS s/p TAVR: doing well 1 week out from TAVR.  ECG with no HAVB. Groin sites healing well. SBE prophylaxis discussed; I have RX'd amoxicillin. Continue Eliquis. I will see her back in 1 month for echo and follow up.   Chronic systolic heart failure/NICM: EF normalized. Appear euvolemic.    Persistent atrial fibrillation on Eliquis s/p atrial fibrillation ablation in 2018: in sinus today by ECG. Continue Eliquis   Right wrist hematoma: Arterial US inpatient which showed small post cath hematoma. Plan to continue monitoring. There was no pseudoaneurysm. Still presents but does not bother her.    HLD: Last LDL 69. Continue statin    Pulmonary nodule:  pre TAVR CT showed a tiny 0.2 cm solid right lower lobe pulmonary nodule. No follow-up needed if patient is low-risk. Non-contrast chest CT can be considered in 12 months if patient is high-risk. Patient is a lifetime non smoker. No follow up is recommended.    Medication Adjustments/Labs and Tests Ordered: Current medicines are reviewed at length with the patient today.  Concerns regarding medicines are outlined above.  Orders Placed This Encounter  Procedures   EKG 12-Lead   Meds ordered this encounter  Medications   amoxicillin (AMOXIL) 500 MG tablet    Sig: Take 4 tablets by mouth 1 hour before dental procedures and cleanings    Dispense:  12 tablet    Refill:  6    Patient Instructions  Medication Instructions:  Start Amoxicillin 500 mg, take 4 tablets by mouth 1 hour before dental procedures and cleanings.   *If you need a refill on your cardiac medications before your next appointment, please call your pharmacy*   Lab Work: None ordered   If you have labs (blood work) drawn today and your tests are completely normal, you will receive your results only by: [place] (if you have MyChart) OR A paper copy in the mail If you have any lab test that is abnormal or we need to change your treatment, we will call you to review the results.   Testing/Procedures: None  ordered today    Follow-Up: Follow up as scheduled    Other Instructions None     Signed, Angelena Form, PA-C  11/09/2021 11:32 AM    [facility]

## 2021-11-09 ENCOUNTER — Other Ambulatory Visit: Payer: Self-pay

## 2021-11-09 ENCOUNTER — Ambulatory Visit: Payer: Medicare PPO

## 2021-11-09 VITALS — BP 130/68 | HR 59 | Ht 65.0 in | Wt 189.0 lb

## 2021-11-09 DIAGNOSIS — I4819 Other persistent atrial fibrillation: Secondary | ICD-10-CM

## 2021-11-09 DIAGNOSIS — R911 Solitary pulmonary nodule: Secondary | ICD-10-CM

## 2021-11-09 DIAGNOSIS — Z952 Presence of prosthetic heart valve: Secondary | ICD-10-CM

## 2021-11-09 DIAGNOSIS — I428 Other cardiomyopathies: Secondary | ICD-10-CM

## 2021-11-09 DIAGNOSIS — S60219S Contusion of unspecified wrist, sequela: Secondary | ICD-10-CM

## 2021-11-09 DIAGNOSIS — E785 Hyperlipidemia, unspecified: Secondary | ICD-10-CM

## 2021-11-09 MED ORDER — AMOXICILLIN 500 MG PO TABS: ORAL_TABLET | ORAL | 6 refills | Status: DC

## 2021-11-09 NOTE — Patient Instructions (Addendum)
Medication Instructions:  Start Amoxicillin 500 mg, take 4 tablets by mouth 1 hour before dental procedures and cleanings.   *If you need a refill on your cardiac medications before your next appointment, please call your pharmacy*   Lab Work: None ordered   If you have labs (blood work) drawn today and your tests are completely normal, you will receive your results only by: [place] (if you have MyChart) OR A paper copy in the mail If you have any lab test that is abnormal or we need to change your treatment, we will call you to review the results.   Testing/Procedures: None ordered today    Follow-Up: Follow up as scheduled    Other Instructions None

## 2021-11-13 NOTE — Progress Notes (Signed)
[facility] Summary Report / Loop Recorder 

## 2021-11-28 ENCOUNTER — Other Ambulatory Visit: Payer: Self-pay

## 2021-11-28 ENCOUNTER — Ambulatory Visit: Payer: Medicare PPO | Admitting: Physician Assistant

## 2021-11-28 ENCOUNTER — Other Ambulatory Visit: Payer: Self-pay | Admitting: Physician Assistant

## 2021-11-28 ENCOUNTER — Ambulatory Visit (HOSPITAL_COMMUNITY): Payer: Medicare PPO | Attending: Cardiology

## 2021-11-28 VITALS — BP 120/50 | HR 66 | Ht 65.0 in | Wt 192.0 lb

## 2021-11-28 DIAGNOSIS — E785 Hyperlipidemia, unspecified: Secondary | ICD-10-CM

## 2021-11-28 DIAGNOSIS — S60219S Contusion of unspecified wrist, sequela: Secondary | ICD-10-CM

## 2021-11-28 DIAGNOSIS — I4819 Other persistent atrial fibrillation: Secondary | ICD-10-CM | POA: Diagnosis not present

## 2021-11-28 DIAGNOSIS — Z952 Presence of prosthetic heart valve: Secondary | ICD-10-CM | POA: Diagnosis not present

## 2021-11-28 DIAGNOSIS — I428 Other cardiomyopathies: Secondary | ICD-10-CM

## 2021-11-28 DIAGNOSIS — R911 Solitary pulmonary nodule: Secondary | ICD-10-CM

## 2021-11-28 NOTE — Patient Instructions (Addendum)
Medication Instructions:  Your physician recommends that you continue on your current medications as directed. Please refer to the Current Medication list given to you today.  *If you need a refill on your cardiac medications before your next appointment, please call your pharmacy*   Lab Work: None ordered  If you have labs (blood work) drawn today and your tests are completely normal, you will receive your results only by: [place] (if you have MyChart) OR A paper copy in the mail If you have any lab test that is abnormal or we need to change your treatment, we will call you to review the results.   Testing/Procedures: Your physician has requested that you have an echocardiogram 1 YEAR.  10/09/2022 arrive at 12:45.  Echocardiography is a painless test that uses sound waves to create images of your heart. It provides your doctor with information about the size and shape of your heart and how well your hearts chambers and valves are working. This procedure takes approximately one hour. There are no restrictions for this procedure.    Follow-Up: At [facility], you and your health needs are our priority.  As part of our continuing mission to provide you with exceptional heart care, we have created designated Provider Care Teams.  These Care Teams include your primary Cardiologist (physician) and Advanced Practice Providers (APPs -  Physician Assistants and Nurse Practitioners) who all work together to provide you with the care you need, when you need it.  We recommend signing up for the patient portal called "MyChart".  Sign up information is provided on this After Visit Summary.  MyChart is used to connect with patients for Virtual Visits (Telemedicine).  Patients are able to view lab/test results, encounter notes, upcoming appointments, etc.  Non-urgent messages can be sent to your provider as well.   To learn more about what you can do with MyChart, go to [email or web address].     Your next appointment:   6 month(s)  The format for your next appointment:   In Person  Provider:   [name], MD   1 year for Structural Heart with Nell Range, PA-C  10/09/22 2:00 (right after your echocardiogram)   Other Instructions

## 2021-11-28 NOTE — Progress Notes (Signed)
[facility] [place]                                     Cardiology Office Note:    Date:  11/29/2021   ID:  Jenna [name], DOB January 13, 1941, MRN [id number]  PCP:  Chesley Noon, MD  [facility] Cardiologist:  [name], MD / Dr. Angelena Form, MD & Dr. Cyndia Bent, MD (TAVR) [facility] Electrophysiologist:  None   Referring MD: Chesley Noon, MD   1 month s/p TAVR  History of Present Illness:    Jenna [name] is a 81 y.o. 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  She has mild aortic stenosis and history of negative nuclear stress test in August 2013. 30 day monitor for palpitations showed Afib. TEE showed EF of 20-25% with no LV thrombus. She underwent atrial fibrillation ablation in April 2018 and maintained normal sinus rhythm.  Follow up echo showed improved EF with restoration of sinus. However due to recurrence of palpitations an ILR was implanted. ILR has not shown Afib but she has been reluctant to stop eliquis. Since then, Dr. Rayann Heman has recommended lifelong anticoagulation.   Echo 10/16/20 showed LVEF at 60-65% with progression to severe aortic stenosis with a mean gradient 32 mmHg, peak gradient 57.8 mmHg, AVA 0.52 cm2, dimensionless index 0.17. She was referred to structural heart and subsequent cardiac catheterization 09/17/21 showed mild, non-obstructive CAD with mild pulm HTN and confirmation of severe AS with a mean gradient 27.3 mmHg, peak to peak gradient 32 mmHg, AVA 0.66 cm2.    She was evaluated by the multidisciplinary valve team and underwent successful TAVR with a 23 mm Edwards Sapien 3 Ultra Resilia THV via the TF approach on  10/30/21. Post operative echo showed EF 65-70%, mod concentric LVH, normally functioning TAVR with a mean gradient of 15 mmHg and no PVL. She was noted to have a lump on her right wrist where she had a cath but follow up US showed no PSA, just hematoma. She was discharged on home Eliquis.   Today the patient presents to clinic for follow up. She is doing okay. Has had some issues with depression. We did spend a lot of time talking about her life and emotional struggles. Overall doing well from a cardiac standpoint. No CP or SOB. No LE edema, orthopnea or PND. No dizziness or syncope. No blood in stool or urine. No palpitations.     Past Medical History:  Diagnosis Date   Anxiety    CHF (congestive heart failure) (HCC)    Chronic lower back pain    Chronic systolic dysfunction of left ventricle    Depression    Fatigue    GERD (gastroesophageal reflux disease)    High cholesterol    Hypertension    Hypothyroidism    Melanoma of shoulder, left (HCC)    Mild aortic stenosis    Nonischemic cardiomyopathy (HCC)    OSA on CPAP    reports compliance with CPAP   Osteoarthritis    Persistent atrial fibrillation with RVR ([place])    [name] 11/15/2016   PONV (postoperative nausea and vomiting)  RLS (restless legs syndrome)    S/P ablation of atrial flutter/ fib 01/10/17 01/11/2017   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 Angelena Form & Dr. Cyndia Bent   Type II diabetes mellitus ([place])    no medications    Past Surgical History:  Procedure Laterality Date   ATRIAL FIBRILLATION ABLATION N/A 01/10/2017   Procedure: Atrial Fibrillation Ablation;  Surgeon: Sherryll Skoczylas Grayer, MD;  Location: [place] CV LAB;  Service: Cardiovascular;  Laterality: N/A;   CARDIOVERSION N/A 11/19/2016   Procedure: CARDIOVERSION;  Surgeon: Lelon Perla, MD;  Location: [facility] ENDOSCOPY;  Service: Cardiovascular;  Laterality: N/A;   CARDIOVERSION N/A  12/11/2016   Procedure: CARDIOVERSION;  Surgeon: Dorothy Spark, MD;  Location: [place];  Service: Cardiovascular;  Laterality: N/A;   CARDIOVERSION N/A 12/18/2016   Procedure: CARDIOVERSION;  Surgeon: Skeet Latch, MD;  Location: [facility] ENDOSCOPY;  Service: Cardiovascular;  Laterality: N/A;   CATARACT EXTRACTION W/ INTRAOCULAR LENS  IMPLANT, BILATERAL Bilateral    DILATION AND CURETTAGE OF UTERUS     implantable loop recorder placement  12/27/2019   Medtronic Reveal Linq model TGG 26 (SN [id number]) implanted in office by Dr Rayann Heman   INTRAOPERATIVE TRANSTHORACIC ECHOCARDIOGRAM N/A 10/30/2021   Procedure: INTRAOPERATIVE TRANSTHORACIC ECHOCARDIOGRAM;  Surgeon: Burnell Blanks, MD;  Location: [place] CV LAB;  Service: Open Heart Surgery;  Laterality: N/A;   LEXISCAN MYOVIEW  05/13/2012   No ECG changes. EKG negative for ischemia. No significant ischemia demonstrated.   MELANOMA EXCISION Left ~ 09/1979   "back of my shoulder"   RIGHT/LEFT HEART CATH AND CORONARY ANGIOGRAPHY N/A 09/17/2021   Procedure: RIGHT/LEFT HEART CATH AND CORONARY ANGIOGRAPHY;  Surgeon: Burnell Blanks, MD;  Location: [place] CV LAB;  Service: Cardiovascular;  Laterality: N/A;   TEE WITHOUT CARDIOVERSION N/A 11/19/2016   Procedure: TRANSESOPHAGEAL ECHOCARDIOGRAM (TEE);  Surgeon: Lelon Perla, MD;  Location: [facility] ENDOSCOPY;  Service: Cardiovascular;  Laterality: N/A;  need CV, but no anesthesia available   TEE WITHOUT CARDIOVERSION N/A 01/10/2017   Procedure: TRANSESOPHAGEAL ECHOCARDIOGRAM (TEE);  Surgeon: [name], MD;  Location: [facility] ENDOSCOPY;  Service: Cardiovascular;  Laterality: N/A;   TOTAL KNEE ARTHROPLASTY Right 07/28/2018   Procedure: RIGHT TOTAL KNEE ARTHROPLASTY;  Surgeon: Mcarthur Rossetti, MD;  Location: [place];  Service: Orthopedics;  Laterality: Right;   TRANSCATHETER AORTIC VALVE REPLACEMENT, TRANSFEMORAL N/A 10/30/2021   Procedure: TRANSCATHETER AORTIC VALVE REPLACEMENT,  TRANSFEMORAL;  Surgeon: Burnell Blanks, MD;  Location: [place] CV LAB;  Service: Open Heart Surgery;  Laterality: N/A;   TRANSTHORACIC ECHOCARDIOGRAM  02/18/2012   EF >55%, mild aortic stenosis   VAGINAL HYSTERECTOMY  1985    Current Medications: Current Meds  Medication Sig   acetaminophen (TYLENOL) 500 MG tablet Take 1,000 mg by mouth every 6 (six) hours as needed for moderate pain.   ALPRAZolam (XANAX) 0.5 MG tablet Take 0.5 mg by mouth 2 (two) times daily.    amoxicillin (AMOXIL) 500 MG tablet Take 4 tablets by mouth 1 hour before dental procedures and cleanings   atorvastatin (LIPITOR) 20 MG tablet Take 1 tablet (20 mg total) by mouth every Monday, Wednesday, and Friday.   Calcium Carb-Cholecalciferol (CALCIUM 600 + D PO) Take 1 tablet by mouth 2 (two) times daily with a meal.   carvedilol (COREG) 6.25 MG tablet Take 1 tablet (6.25 mg total) by mouth 2 (two) times daily with a  meal.   diphenhydramine-acetaminophen (TYLENOL PM) 25-500 MG TABS tablet Take 2 tablets by mouth at bedtime as needed (sleep).   ELIQUIS 5 MG TABS tablet TAKE 1 TABLET BY MOUTH TWICE A DAY   furosemide (LASIX) 40 MG tablet Take 1 tablet (40 mg total) by mouth daily.   hypromellose (SYSTANE OVERNIGHT THERAPY) 0.3 % GEL ophthalmic ointment Place 2 application into both eyes at bedtime.   levothyroxine (SYNTHROID, LEVOTHROID) 88 MCG tablet Take 88 mcg by mouth daily before breakfast.   loratadine (CLARITIN) 10 MG tablet Take 10 mg by mouth daily.   Magnesium 500 MG TABS Take 500 mg by mouth every Monday, Wednesday, and Friday.   metolazone (ZAROXOLYN) 2.5 MG tablet Take 2.5 mg by mouth daily as needed for edema.   Multiple Vitamins-Minerals (PRESERVISION AREDS 2) CAPS Take 1 capsule by mouth 2 (two) times daily.   omeprazole (PRILOSEC) 40 MG capsule Take 40 mg by mouth every evening.   ONETOUCH ULTRA test strip    Polyethyl Glycol-Propyl Glycol (SYSTANE OP) Place 1-2 drops into both eyes 3 (three) times  daily.   pyridOXINE (VITAMIN B-6) 100 MG tablet Take 100 mg by mouth daily with supper.   rOPINIRole (REQUIP) 0.5 MG tablet Take 0.5-1 mg by mouth See admin instructions. Take 0.5 mg at lunch, 0.5 mg at supper, and 1 mg at bedtime   spironolactone (ALDACTONE) 25 MG tablet Take 0.5 tablets (12.5 mg total) by mouth daily.   zinc gluconate 50 MG tablet Take 50 mg by mouth daily.     Allergies:   Crestor [rosuvastatin] and Lovastatin   Social History   Socioeconomic History   Marital status: Widowed    Spouse name: Not on file   Number of children: 2   Years of education: Not on file   Highest education level: Not on file  Occupational History   Occupation: [employer]  Tobacco Use   Smoking status: Never   Smokeless tobacco: Never  Vaping Use   Vaping Use: Never used  Substance and Sexual Activity   Alcohol use: No   Drug use: Never   Sexual activity: Never  Other Topics Concern   Not on file  Social History Narrative   Not on file   Social Determinants of Health   Financial Resource Strain: Not on file  Food Insecurity: Not on file  Transportation Needs: Not on file  Physical Activity: Not on file  Stress: Not on file  Social Connections: Not on file     Family History: The patient's family history includes Arrhythmia in her sister; Cancer in her paternal grandfather; Diabetes in her mother; Healthy in her brother and daughter; Heart failure in her father; Hyperlipidemia in her sister and son; Hypertension in her mother, sister, and son; Stroke in her paternal grandmother.  ROS:   Please see the history of present illness.    All other systems reviewed and are negative.  EKGs/Labs/Other Studies Reviewed:    The following studies were reviewed today:  TAVR OPERATIVE NOTE     Date of Procedure:                10/30/2021   Preoperative Diagnosis:      Severe Aortic Stenosis    Postoperative Diagnosis:    Same    Procedure:         Transcatheter Aortic Valve Replacement - Percutaneous Right Transfemoral Approach             Edwards Sapien 3 Ultra  Resilia THV (size 23 mm, model # 9755RSL, serial # [id number])              Co-Surgeons:                        Gaye Pollack, MD and  Lauree Chandler, MD     Anesthesiologist:                  Raechel Ache, MD   Echocardiographer:              Osborne Oman, MD   Pre-operative Echo Findings: Severe aortic stenosis Normal left ventricular systolic function   Post-operative Echo Findings: No paravalvular leak Normal left ventricular systolic function   _____________   Echo 10/31/21 IMPRESSIONS   1. The aortic valve has been replaced. Aortic valve regurgitation is not  visualized (no PVL). Effective orifice area index, by VTI measures 1.28  cm. Aortic valve mean gradient measures 15.0 mmHg. Peak gradient 28 mm  Hg. DVI 0.75. Acceleration time 90  ms.   2. Left ventricular ejection fraction, by estimation, is 65 to 70%. The  left ventricle has normal function. The left ventricle has no regional  wall motion abnormalities. There is moderate concentric left ventricular  hypertrophy. Left ventricular  diastolic parameters are consistent with Grade I diastolic dysfunction  (impaired relaxation).   3. Right ventricular systolic function is normal. The right ventricular  size is normal. There is normal pulmonary artery systolic pressure.   4. The mitral valve is normal in structure. No evidence of mitral valve  regurgitation. No evidence of mitral stenosis.   Comparison(s): Increase in gradients but stable prosthetic parameters.   ______________________  10/30/21 VAS [place] UE ARTERIAL DUPLEX Summary:     Right: 1.6 x 1.1 x 1.5cm heterogenous area visualized in the distal         right upper extremity, adjacent to the radial artery in the         area of recent cardiac catheterization with no internal         vascularity. This is suggestive of a hematoma. >50%  stenosis         visualized in the right radial artery, likely related to         hematoma compressing radial artery.   _________________________  Echo 11/28/21 IMPRESSIONS   1. Left ventricular ejection fraction, by estimation, is 60 to 65%. The  left ventricle has normal function. The left ventricle has no regional  wall motion abnormalities. There is moderate concentric left ventricular  hypertrophy. Left ventricular  diastolic parameters were normal.   2. Right ventricular systolic function is normal. The right ventricular  size is normal. There is normal pulmonary artery systolic pressure. The  estimated right ventricular systolic pressure is 67.5 mmHg.   3. The mitral valve is normal in structure. No evidence of mitral valve  regurgitation. No evidence of mitral stenosis.   4. The aortic valve has been repaired/replaced. Aortic valve  regurgitation is not visualized. There is a 23 mm Sapien prosthetic (TAVR)  valve present in the aortic position. Procedure Date: 10/30/2021. Echo  findings are consistent with normal structure  and function of the aortic valve prosthesis. Aortic valve mean gradient  measures 10.0 mmHg.   5. The inferior vena cava is normal in size with greater than 50%  respiratory variability, suggesting right atrial pressure of 3 mmHg.   Comparison(s): No significant change from prior study.  Conclusion(s)/Recommendation(s): S/P TAVR. Gradient improved from 15 mmHg  to 10 mmHg on current study. Valve area is different between studies, but  notably the stroke volume is significantly different between studies.   EKG:  EKG is NOT ordered today.    Recent Labs: 10/26/2021: ALT 23; B Natriuretic Peptide 123.6 10/31/2021: BUN 21; Creatinine, Ser 0.89; Hemoglobin 11.8; Magnesium 1.9; Platelets 209; Potassium 3.7; Sodium 135  Recent Lipid Panel    Component Value Date/Time   CHOL 170 02/11/2017 0941   TRIG 93 02/11/2017 0941   HDL 82 02/11/2017 0941   CHOLHDL  2.1 02/11/2017 0941   VLDL 19 02/11/2017 0941   LDLCALC 69 02/11/2017 0941     Risk Assessment/Calculations:    CHA2DS2-VASc Score = 4   This indicates a 4.8% annual risk of stroke. The patient's score is based upon: CHF History: 1 HTN History: 0 Diabetes History: 0 Stroke History: 0 Vascular Disease History: 0 Age Score: 2 Gender Score: 1      Physical Exam:    VS:  BP (!) 120/50 (BP Location: Left Arm, Patient Position: Sitting, Cuff Size: Normal)    Pulse 66    Ht 5\' 5"  (1.651 m)    Wt 192 lb (87.1 kg)    SpO2 97%    BMI 31.95 kg/m     Wt Readings from Last 3 Encounters:  11/28/21 192 lb (87.1 kg)  11/09/21 189 lb (85.7 kg)  10/31/21 189 lb 6 oz (85.9 kg)     GEN:  Well nourished, well developed in no acute distress HEENT: Normal NECK: No JVD LYMPHATICS: No lymphadenopathy CARDIAC: RRR, no murmurs, rubs, gallops RESPIRATORY:  Clear to auscultation without rales, wheezing or rhonchi  ABDOMEN: Soft, non-tender, non-distended MUSCULOSKELETAL:  No edema; No deformity  SKIN: Warm and dry. Quarter sized lump on right wrist. Non tender.  NEUROLOGIC:  Alert and oriented x 3 PSYCHIATRIC:  Normal affect   ASSESSMENT:    1. S/P TAVR (transcatheter aortic valve replacement)   2. Nonischemic cardiomyopathy (HCC)   3. Persistent atrial fibrillation ([place])   4. Traumatic hematoma of wrist, sequela   5. Hyperlipidemia, unspecified hyperlipidemia type   6. Pulmonary nodule     PLAN:    In order of problems listed above:  Severe AS s/p TAVR: echo today shows EF 60%, normally functioning TAVR with a mean gradient of 10 mm hg and no PVL. She has NYHA class I symptoms, mostly struggles with periodic depression. SBE prophylaxis discussed; she has RX'd amoxicillin. Continue Eliquis. I will see her back in 1 year for echo and follow up.   Chronic systolic heart failure/NICM: EF normalized. Appear euvolemic.    Persistent atrial fibrillation on Eliquis s/p atrial fibrillation  ablation in 2018: in sinus today by ECG. Continue Eliquis   Right wrist hematoma: Arterial US inpatient which showed small post cath hematoma. Plan to continue monitoring. There was no pseudoaneurysm. Still present but does not bother her.    HLD: Last LDL 69. Continue statin    Pulmonary nodule:  pre TAVR CT showed a tiny 0.2 cm solid right lower lobe pulmonary nodule. No follow-up needed if patient is low-risk. Non-contrast chest CT can be considered in 12 months if patient is high-risk. Patient is a lifetime non smoker. No follow up is recommended.   Depression: we spent about 40 minutes talking about this. She has tried Wellbutrin, Prozac and Zoloft in the past. She is very close with her PCP Dr. Melford Aase and knows she needs  to call him. She has an appointment in late March. She lost her husband and sister and worries about her own health. I encouraged visiting with friends, increased exercise and outside activities. I also offered to refer her to psychology or psychiatry.  She will let me know if she needs anything.  Total time spent with patient was over 40 minutes which included evaluating patient, reviewing record and coordinating care. Face to face time >50%.   Medication Adjustments/Labs and Tests Ordered: Current medicines are reviewed at length with the patient today.  Concerns regarding medicines are outlined above.  No orders of the defined types were placed in this encounter.  No orders of the defined types were placed in this encounter.   Patient Instructions  Medication Instructions:  Your physician recommends that you continue on your current medications as directed. Please refer to the Current Medication list given to you today.  *If you need a refill on your cardiac medications before your next appointment, please call your pharmacy*   Lab Work: None ordered  If you have labs (blood work) drawn today and your tests are completely normal, you will receive your results  only by: [place] (if you have MyChart) OR A paper copy in the mail If you have any lab test that is abnormal or we need to change your treatment, we will call you to review the results.   Testing/Procedures: Your physician has requested that you have an echocardiogram 1 YEAR.  10/09/2022 arrive at 12:45.  Echocardiography is a painless test that uses sound waves to create images of your heart. It provides your doctor with information about the size and shape of your heart and how well your hearts chambers and valves are working. This procedure takes approximately one hour. There are no restrictions for this procedure.    Follow-Up: At [facility], you and your health needs are our priority.  As part of our continuing mission to provide you with exceptional heart care, we have created designated Provider Care Teams.  These Care Teams include your primary Cardiologist (physician) and Advanced Practice Providers (APPs -  Physician Assistants and Nurse Practitioners) who all work together to provide you with the care you need, when you need it.  We recommend signing up for the patient portal called "MyChart".  Sign up information is provided on this After Visit Summary.  MyChart is used to connect with patients for Virtual Visits (Telemedicine).  Patients are able to view lab/test results, encounter notes, upcoming appointments, etc.  Non-urgent messages can be sent to your provider as well.   To learn more about what you can do with MyChart, go to [email or web address].    Your next appointment:   6 month(s)  The format for your next appointment:   In Person  Provider:   [name], MD   1 year for Structural Heart with Nell Range, PA-C  10/09/22 2:00 (right after your echocardiogram)   Other Instructions    Signed, Angelena Form, PA-C  11/29/2021 11:44 AM    [place]

## 2021-11-29 LAB — ECHOCARDIOGRAM COMPLETE
AR max vel: 1.24 cm2
AV Area VTI: 1.33 cm2
AV Area mean vel: 1.35 cm2
AV Mean grad: 10 mmHg
AV Peak grad: 20.3 mmHg
Ao pk vel: 2.25 m/s
Area-P 1/2: 2.72 cm2
S' Lateral: 2.5 cm

## 2021-12-06 ENCOUNTER — Encounter: Payer: Self-pay | Admitting: Adult Health

## 2021-12-06 ENCOUNTER — Other Ambulatory Visit: Payer: Self-pay

## 2021-12-06 ENCOUNTER — Ambulatory Visit: Payer: Medicare PPO | Admitting: Adult Health

## 2021-12-06 DIAGNOSIS — G2581 Restless legs syndrome: Secondary | ICD-10-CM

## 2021-12-06 DIAGNOSIS — G4733 Obstructive sleep apnea (adult) (pediatric): Secondary | ICD-10-CM | POA: Diagnosis not present

## 2021-12-06 NOTE — Assessment & Plan Note (Signed)
Continue on Requip. 

## 2021-12-06 NOTE — Progress Notes (Signed)
? ?@Patient  ID: Jenna [name], female    DOB: 1941-02-11, 81 y.o.   MRN: [id number] ? ?Chief Complaint  ?Patient presents with  ? Follow-up  ? ? ?Referring provider: ?Chesley Noon, MD ? ?HPI: ?81 year old female followed for obstructive sleep apnea and restless leg syndrome ? ?TEST/EVENTS :  ?NPSG 2005:  AHI 17/hr, optimal pressure 11cm ?Auto 2014:  Optimal pressure 13cm  ? ?12/06/2021 Follow up : OSA and RLS  ?Patient presents for 1 year follow-up.  Patient has underlying obstructive sleep apnea.  Patient says that she wears her CPAP every single night.  Is been have some difficulty over the last week.  Patient says she feels rested on her CPAP.  Usually cannot sleep without it.  CPAP download shows excellent compliance with 83% usage at 7.5 hours.  Patient is on CPAP 13 cm H2O.  AHI is 1.6.  Positive mask leaks.  ? ?Patient has restless leg syndrome.  Is on Requip 0.5 mg 4 times a day.  Patient says this does help with her symptoms. Gets refills from PCP .  ? ?Mother of Ewell Poe who work at our clinic.  ? ?Had Aortic valve replacement in January did well .  ? ?Allergies  ?Allergen Reactions  ? Crestor [Rosuvastatin] Other (See Comments)  ?  Joints ached  ? Lovastatin Other (See Comments)  ?  Joints hurt all over body   ? ? ?Immunization History  ?Administered Date(s) Administered  ? Fluad Quad(high Dose 65+) 07/20/2021  ? H1N1 09/12/2008  ? Influenza Split 07/22/2011, 07/16/2012, 07/29/2016, 07/18/2017  ? Influenza, High Dose Seasonal PF 07/29/2016, 07/20/2017, 07/19/2018, 07/24/2019, 07/17/2020  ? Influenza, Seasonal, Injecte, Preservative Fre 07/18/2017  ? Influenza,inj,Quad PF,6+ Mos 07/13/2013, 07/18/2014, 07/24/2015  ? Influenza,inj,quad, With Preservative 07/13/2013, 07/18/2014, 07/24/2015  ? PFIZER(Purple Top)SARS-COV-2 Vaccination 12/06/2019, 01/04/2020  ? Pension scheme manager 36yrs & up 08/17/2021  ? Pneumococcal Conjugate-13 01/16/2015  ? Pneumococcal Polysaccharide-23 09/21/2019   ? Tdap 01/16/2015  ? Zoster Recombinat (Shingrix) 03/15/2017, 07/20/2017  ? Zoster, Live 07/21/2017  ? ? ?Past Medical History:  ?Diagnosis Date  ? Anxiety   ? CHF (congestive heart failure) ([place])   ? Chronic lower back pain   ? Chronic systolic dysfunction of left ventricle   ? Depression   ? Fatigue   ? GERD (gastroesophageal reflux disease)   ? High cholesterol   ? Hypertension   ? Hypothyroidism   ? Melanoma of shoulder, left ([place])   ? Mild aortic stenosis   ? Nonischemic cardiomyopathy ([place])   ? OSA on CPAP   ? reports compliance with CPAP  ? Osteoarthritis   ? Persistent atrial fibrillation with RVR ([place])   ? [name] 11/15/2016  ? PONV (postoperative nausea and vomiting)   ? RLS (restless legs syndrome)   ? S/P ablation of atrial flutter/ fib 01/10/17 01/11/2017  ? 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 s/p TAVR with a 23 mm Edwards S3UR via the TF appoach by Dr. Angelena Form & Dr. Cyndia Bent  ? Type II diabetes mellitus ([place])   ? no medications  ? ? ?Tobacco History: ?Social History  ? ?Tobacco Use  ?Smoking Status Never  ?Smokeless Tobacco Never  ? ?Counseling given: Not Answered ? ? ?Outpatient Medications Prior to Visit  ?Medication Sig Dispense Refill  ? acetaminophen (TYLENOL) 500 MG tablet Take 1,000 mg by mouth every 6 (six) hours as needed for moderate pain.    ? ALPRAZolam (XANAX) 0.5 MG tablet Take 0.5 mg  by mouth 2 (two) times daily.     ? amoxicillin (AMOXIL) 500 MG tablet Take 4 tablets by mouth 1 hour before dental procedures and cleanings 12 tablet 6  ? atorvastatin (LIPITOR) 20 MG tablet Take 1 tablet (20 mg total) by mouth every Monday, Wednesday, and Friday. 90 tablet 0  ? Calcium Carb-Cholecalciferol (CALCIUM 600 + D PO) Take 1 tablet by mouth 2 (two) times daily with a meal.    ? carvedilol (COREG) 6.25 MG tablet Take 1 tablet (6.25 mg total) by mouth 2 (two) times daily with a meal. 180 tablet 3  ? diphenhydramine-acetaminophen (TYLENOL PM) 25-500 MG TABS tablet Take 2  tablets by mouth at bedtime as needed (sleep).    ? ELIQUIS 5 MG TABS tablet TAKE 1 TABLET BY MOUTH TWICE A DAY 180 tablet 1  ? furosemide (LASIX) 40 MG tablet Take 1 tablet (40 mg total) by mouth daily. 90 tablet 3  ? hypromellose (SYSTANE OVERNIGHT THERAPY) 0.3 % GEL ophthalmic ointment Place 2 application into both eyes at bedtime.    ? levothyroxine (SYNTHROID, LEVOTHROID) 88 MCG tablet Take 88 mcg by mouth daily before breakfast.    ? loratadine (CLARITIN) 10 MG tablet Take 10 mg by mouth daily.    ? Magnesium 500 MG TABS Take 500 mg by mouth every Monday, Wednesday, and Friday.    ? metolazone (ZAROXOLYN) 2.5 MG tablet Take 2.5 mg by mouth daily as needed for edema.    ? Multiple Vitamins-Minerals (PRESERVISION AREDS 2) CAPS Take 1 capsule by mouth 2 (two) times daily.    ? omeprazole (PRILOSEC) 40 MG capsule Take 40 mg by mouth every evening.    ? ONETOUCH ULTRA test strip     ? Polyethyl Glycol-Propyl Glycol (SYSTANE OP) Place 1-2 drops into both eyes 3 (three) times daily.    ? pyridOXINE (VITAMIN B-6) 100 MG tablet Take 100 mg by mouth daily with supper.    ? rOPINIRole (REQUIP) 0.5 MG tablet Take 0.5-1 mg by mouth See admin instructions. Take 0.5 mg at lunch, 0.5 mg at supper, and 1 mg at bedtime    ? spironolactone (ALDACTONE) 25 MG tablet Take 0.5 tablets (12.5 mg total) by mouth daily. 45 tablet 3  ? zinc gluconate 50 MG tablet Take 50 mg by mouth daily.    ? ?No facility-administered medications prior to visit.  ? ? ? ?Review of Systems:  ? ?Constitutional:   No  weight loss, night sweats,  Fevers, chills,  ?+fatigue, or  lassitude. ? ?HEENT:   No headaches,  Difficulty swallowing,  Tooth/dental problems, or  Sore throat,  ?              No sneezing, itching, ear ache, nasal congestion, post nasal drip,  ? ?CV:  No chest pain,  Orthopnea, PND, swelling in lower extremities, anasarca, dizziness, palpitations, syncope.  ? ?GI  No heartburn, indigestion, abdominal pain, nausea, vomiting, diarrhea, change  in bowel habits, loss of appetite, bloody stools.  ? ?Resp: No shortness of breath with exertion or at rest.  No excess mucus, no productive cough,  No non-productive cough,  No coughing up of blood.  No change in color of mucus.  No wheezing.  No chest wall deformity ? ?Skin: no rash or lesions. ? ?GU: no dysuria, change in color of urine, no urgency or frequency.  No flank pain, no hematuria  ? ?MS:  No joint pain or swelling.  No decreased range of motion.  No back  pain. ? ? ? ?Physical Exam ? ?BP 112/68 (BP Location: Left Arm, Patient Position: Sitting, Cuff Size: Normal)   Pulse 68   Temp 97.8 ?F (36.6 ?C) (Oral)   Ht 5\' 5"  (1.651 m)   Wt 189 lb 9.6 oz (86 kg)   SpO2 97%   BMI 31.55 kg/m?  ? ?GEN: A/Ox3; pleasant , NAD, well nourished  ?  ?HEENT:  [place]/AT,  NOSE-clear, THROAT-clear, no lesions, no postnasal drip or exudate noted.  ?Class 2 MP airway  ? ?NECK:  Supple w/ fair ROM; no JVD; normal carotid impulses w/o bruits; no thyromegaly or nodules palpated; no lymphadenopathy.   ? ?RESP  Clear  P & A; w/o, wheezes/ rales/ or rhonchi. no accessory muscle use, no dullness to percussion ? ?CARD:  RRR, no m/r/g, no peripheral edema, pulses intact, no cyanosis or clubbing. ? ?GI:   Soft & nt; nml bowel sounds; no organomegaly or masses detected.  ? ?Musco: Warm bil, no deformities or joint swelling noted.  ? ?Neuro: alert, no focal deficits noted.   ? ?Skin: Warm, no lesions or rashes ? ? ? ?Lab Results: ? ?CBC ? ? ?BMET ? ? ?BNP ? ? ?ProBNP ?No results found for: PROBNP ? ?Imaging: ? ? ? ? ?No flowsheet data found. ? ?No results found for: NITRICOXIDE ? ? ? ? ? ?Assessment & Plan:  ? ?Obstructive sleep apnea ?Good control and compliance on CPAP  ? ?Plan  ?Patient Instructions  ?Continue on CPAP at bedtime ?Continue to work on healthy weight ?Do not drive if sleepy ?Continue on Requip ?Follow-up with Dr. Elsworth Soho in 1 year and as needed ?  ? ? ?RESTLESS LEGS SYNDROME ?Continue on Requip .  ? ? ? ? ?Rexene Edison,  NP ?12/06/2021 ? ?

## 2021-12-06 NOTE — Patient Instructions (Signed)
Continue on CPAP at bedtime ?Continue to work on healthy weight ?Do not drive if sleepy ?Continue on Requip ?Follow-up with Dr. Elsworth Soho in 1 year and as needed ?

## 2021-12-06 NOTE — Assessment & Plan Note (Signed)
Good control and compliance on CPAP  ? ?Plan  ?Patient Instructions  ?Continue on CPAP at bedtime ?Continue to work on healthy weight ?Do not drive if sleepy ?Continue on Requip ?Follow-up with Dr. Elsworth Soho in 1 year and as needed ?  ? ?

## 2021-12-10 ENCOUNTER — Ambulatory Visit (INDEPENDENT_AMBULATORY_CARE_PROVIDER_SITE_OTHER): Payer: Medicare PPO

## 2021-12-10 DIAGNOSIS — I428 Other cardiomyopathies: Secondary | ICD-10-CM | POA: Diagnosis not present

## 2021-12-10 LAB — CUP PACEART REMOTE DEVICE CHECK
Date Time Interrogation Session: 20230303232730
Implantable Pulse Generator Implant Date: 20210322

## 2021-12-19 ENCOUNTER — Other Ambulatory Visit: Payer: Self-pay | Admitting: Physician Assistant

## 2021-12-19 ENCOUNTER — Telehealth: Payer: Self-pay

## 2021-12-19 DIAGNOSIS — S60219S Contusion of unspecified wrist, sequela: Secondary | ICD-10-CM

## 2021-12-19 NOTE — Telephone Encounter (Signed)
Pt contacted to see how her right wrist is healing. Per pt she still has the hematoma on her wrist and she also has another small knot near this area which is new, the size of hematoma has not decreased in size since the time of TAVR , when this was noted.  Discussed with Nell Range PA-C and plan to repeat ultrasound of right wrist (previously performed 1/24). Pt scheduled for test on 3/16 at 1:00 PM.  ?

## 2021-12-20 ENCOUNTER — Other Ambulatory Visit: Payer: Self-pay

## 2021-12-20 ENCOUNTER — Ambulatory Visit (HOSPITAL_COMMUNITY)
Admission: RE | Admit: 2021-12-20 | Discharge: 2021-12-20 | Disposition: A | Discharge status: Home / Self Care | Payer: Medicare PPO | Source: Ambulatory Visit | Attending: Cardiology | Admitting: Cardiology

## 2021-12-20 DIAGNOSIS — S60219S Contusion of unspecified wrist, sequela: Secondary | ICD-10-CM | POA: Diagnosis not present

## 2021-12-20 DIAGNOSIS — S60211D Contusion of right wrist, subsequent encounter: Secondary | ICD-10-CM

## 2021-12-20 DIAGNOSIS — S60211S Contusion of right wrist, sequela: Secondary | ICD-10-CM | POA: Diagnosis not present

## 2021-12-20 NOTE — Progress Notes (Signed)
[facility] Summary Report / Loop Recorder 

## 2021-12-21 ENCOUNTER — Telehealth: Payer: Self-pay | Admitting: Cardiology

## 2021-12-21 NOTE — Telephone Encounter (Signed)
Patient called in regards to [place] completed yesterday afternoon that shows no pseudoaneurysm but a hematoma. She is interested in seeing vascular still however will ask her family which MD they have seen in the past and plans to call back Monday afternoon for referral.  ? ? ?Kathyrn Drown NP-C ?Structural Heart Team  ?Pager: [phone] ?Phone: [phone] ? ?

## 2021-12-25 ENCOUNTER — Other Ambulatory Visit: Payer: Self-pay

## 2021-12-25 DIAGNOSIS — T148XXA Other injury of unspecified body region, initial encounter: Secondary | ICD-10-CM

## 2021-12-25 NOTE — Telephone Encounter (Signed)
Pt contacted the office and requested referral to [facility] for further evaluation of right wrist. The pt spoke with family and they are unsure of which MD family members have seen in the past.  I made the pt aware that referral will be placed and [facility] will contact her to arrange evaluation. Pt agreed with plan.  ?

## 2022-01-14 ENCOUNTER — Ambulatory Visit (INDEPENDENT_AMBULATORY_CARE_PROVIDER_SITE_OTHER): Payer: Medicare PPO

## 2022-01-14 DIAGNOSIS — I428 Other cardiomyopathies: Secondary | ICD-10-CM | POA: Diagnosis not present

## 2022-01-15 LAB — CUP PACEART REMOTE DEVICE CHECK
Date Time Interrogation Session: 20230406003736
Implantable Pulse Generator Implant Date: 20210322

## 2022-01-29 NOTE — Progress Notes (Signed)
[facility] Summary Report / Loop Recorder 

## 2022-01-30 ENCOUNTER — Encounter: Payer: Medicare PPO | Admitting: Vascular Surgery

## 2022-02-18 ENCOUNTER — Ambulatory Visit (INDEPENDENT_AMBULATORY_CARE_PROVIDER_SITE_OTHER): Payer: Medicare PPO

## 2022-02-18 DIAGNOSIS — I428 Other cardiomyopathies: Secondary | ICD-10-CM | POA: Diagnosis not present

## 2022-02-18 LAB — CUP PACEART REMOTE DEVICE CHECK
Date Time Interrogation Session: 20230510230756
Implantable Pulse Generator Implant Date: 20210322

## 2022-02-19 ENCOUNTER — Ambulatory Visit: Payer: Medicare PPO | Admitting: Physician Assistant

## 2022-03-08 NOTE — Progress Notes (Signed)
[facility] Summary Report / Loop Recorder 

## 2022-03-21 ENCOUNTER — Other Ambulatory Visit: Payer: Self-pay | Admitting: Adult Health

## 2022-03-25 ENCOUNTER — Ambulatory Visit (INDEPENDENT_AMBULATORY_CARE_PROVIDER_SITE_OTHER): Payer: Medicare PPO

## 2022-03-25 DIAGNOSIS — I428 Other cardiomyopathies: Secondary | ICD-10-CM | POA: Diagnosis not present

## 2022-03-26 LAB — CUP PACEART REMOTE DEVICE CHECK
Date Time Interrogation Session: 20230612231403
Implantable Pulse Generator Implant Date: 20210322

## 2022-04-16 NOTE — Progress Notes (Signed)
[facility] Summary Report / Loop Recorder 

## 2022-04-29 ENCOUNTER — Ambulatory Visit (INDEPENDENT_AMBULATORY_CARE_PROVIDER_SITE_OTHER): Payer: Medicare PPO

## 2022-04-29 DIAGNOSIS — I4819 Other persistent atrial fibrillation: Secondary | ICD-10-CM

## 2022-04-29 LAB — CUP PACEART REMOTE DEVICE CHECK
Date Time Interrogation Session: 20230715231637
Implantable Pulse Generator Implant Date: 20210322

## 2022-05-31 NOTE — Progress Notes (Signed)
[facility] Summary Report / Loop Recorder 

## 2022-06-02 LAB — CUP PACEART REMOTE DEVICE CHECK
Date Time Interrogation Session: 20230817232112
Implantable Pulse Generator Implant Date: 20210322

## 2022-06-03 ENCOUNTER — Ambulatory Visit (INDEPENDENT_AMBULATORY_CARE_PROVIDER_SITE_OTHER): Payer: Medicare PPO

## 2022-06-03 DIAGNOSIS — I4819 Other persistent atrial fibrillation: Secondary | ICD-10-CM | POA: Diagnosis not present

## 2022-06-26 NOTE — Progress Notes (Signed)
[facility] Summary Report / Loop Recorder 

## 2022-07-08 ENCOUNTER — Ambulatory Visit (INDEPENDENT_AMBULATORY_CARE_PROVIDER_SITE_OTHER): Payer: Medicare PPO

## 2022-07-08 DIAGNOSIS — I4819 Other persistent atrial fibrillation: Secondary | ICD-10-CM | POA: Diagnosis not present

## 2022-07-09 LAB — CUP PACEART REMOTE DEVICE CHECK
Date Time Interrogation Session: 20231001231230
Implantable Pulse Generator Implant Date: 20210322

## 2022-07-23 NOTE — Progress Notes (Signed)
[facility] Summary Report / Loop Recorder 

## 2022-07-29 ENCOUNTER — Telehealth: Payer: Self-pay

## 2022-07-29 NOTE — Telephone Encounter (Signed)
LINQ alert received.  54 AF events, event in progress, poor rate control burden 53.3%, Coreg, Eliquis Route to triage for RVR  Recall has been sent to Pt.  Due for follow up with CL 10/23.  Will forward to scheduler to contact Pt to schedule appointment.

## 2022-08-01 ENCOUNTER — Telehealth: Payer: Self-pay

## 2022-08-01 NOTE — Telephone Encounter (Signed)
Patients daughter returned phone call. Daughter states patient has been out of medications for months and in a depressive state since April. States patient will not go to PCP or any doctors now. Advised I will forward to Dr. Quentin Ore to review for thoughts.

## 2022-08-01 NOTE — Telephone Encounter (Signed)
ILR alert report received. Battery status OK. Normal device function. No new symptom, tachy, brady, or pause episodes. 14 new AF episodes, on [place], there is not good ventricular rate control, sent to triage. AF burden is 96.6% of the time.  Monthly summary reports and ROV/PRN.  Called patient and noted very difficult to carry on conversation with. Patient is unable to answer questions without significant pause as well as not able to completely. During attempting to speak with patient, patient became tearful and voiced she stopped taking her medications/getting them filled. Patient was unable to explain why, notably tearful. Advised patient I will call her son/daughter for more information.   Attempted to call patients daughter, no answer. Spoke to patients son Vivi Martens who is on DPR, he advised he does not know what all is going on with his mother but he and his sister have great concerns about her mental health and wellbeing. States she appears depressed, currently living alone, unable to see without magnifying glass, unable to drive and has isolated herself from family and friends. He states his sister and himself check on her daily but patient has refused to let them help much. States he does not know if she is taking her medications but knows she has not filled them in the past. Nicole Kindred states he will call his sister and they will check on his mother today.  Advised of elevated heart rates on ILR report and stressed importance of medication compliance/needs in-clinic check with provider. Phylliss Bob, if patient is refusing to take medications due to self harm or patients expresses concerns for self harm, patient needs to go to the emergency department for evaluation. Nicole Kindred agreeable and voiced understanding.   Noted on file patient does have hx of depression and anxiety. Xanax on file, asked patient if she has taken them and if so how many. Patient unable to give clear answer.

## 2022-08-05 ENCOUNTER — Telehealth: Payer: Self-pay

## 2022-08-05 NOTE — Telephone Encounter (Signed)
Pt daughter left a message for the nurse to give them a call back. Tonya phone number is [phone].

## 2022-08-05 NOTE — Telephone Encounter (Signed)
Opening error 

## 2022-08-05 NOTE — Telephone Encounter (Signed)
I would recommend she present to her primary care office or emergency department for a check up and to assess her depression, vision, etc.    Thanks,  Lars Mage   Left message to call back

## 2022-08-05 NOTE — Telephone Encounter (Signed)
Notified Tonya. Confirmed PCP appointment is scheduled for tomorrow (08/06/22) as she had agreed to go to the MD.

## 2022-08-12 ENCOUNTER — Ambulatory Visit (INDEPENDENT_AMBULATORY_CARE_PROVIDER_SITE_OTHER): Payer: Medicare PPO

## 2022-08-12 DIAGNOSIS — I4819 Other persistent atrial fibrillation: Secondary | ICD-10-CM

## 2022-08-13 LAB — CUP PACEART REMOTE DEVICE CHECK
Date Time Interrogation Session: 20231103231130
Implantable Pulse Generator Implant Date: 20210322

## 2022-09-12 LAB — CUP PACEART REMOTE DEVICE CHECK
Date Time Interrogation Session: 20231206221724
Implantable Pulse Generator Implant Date: 20210322

## 2022-09-12 NOTE — Progress Notes (Signed)
[facility] Summary Report / Loop Recorder 

## 2022-09-16 ENCOUNTER — Ambulatory Visit (INDEPENDENT_AMBULATORY_CARE_PROVIDER_SITE_OTHER): Payer: Medicare PPO

## 2022-09-16 DIAGNOSIS — I4819 Other persistent atrial fibrillation: Secondary | ICD-10-CM | POA: Diagnosis not present

## 2022-09-17 ENCOUNTER — Telehealth: Payer: Self-pay

## 2022-09-17 NOTE — Telephone Encounter (Signed)
-----   Message from [name], MD sent at 09/16/2022 10:20 PM EST ----- ILR reviewed. AF burden 97.9%, poor rate control. On Eliquis.  Device clinic, please refer to AF clinic for titration of her rate control regimen.  Lysbeth Galas T. Quentin Ore, MD, [facility], [facility] Cardiac Electrophysiology

## 2022-09-17 NOTE — Telephone Encounter (Signed)
Attempted to return call. No answer, LMTCB.

## 2022-09-17 NOTE — Telephone Encounter (Signed)
Pt daughter returning nurse call again. She did not hear the phone ring. She states she is listening more carefully for nurse call.

## 2022-09-17 NOTE — Telephone Encounter (Signed)
Pt daughter returning nurse call. Her phone number is [phone].

## 2022-09-17 NOTE — Telephone Encounter (Signed)
Spoke to patients daughter about AF clinic referral for rate control. Agreeable to AF clinic.  Please call Jenna [name] for apt. [phone]

## 2022-09-17 NOTE — Telephone Encounter (Signed)
Attempted to contact patient per Dr. Quentin Ore to advise he recommends AF clinic referral. No answer on either number on file. LMTCB.

## 2022-09-24 ENCOUNTER — Ambulatory Visit (HOSPITAL_COMMUNITY)
Admission: RE | Admit: 2022-09-24 | Discharge: 2022-09-24 | Disposition: A | Discharge status: Home / Self Care | Payer: Medicare PPO | Source: Ambulatory Visit | Attending: Physician Assistant | Admitting: Physician Assistant

## 2022-09-24 VITALS — BP 176/100 | HR 153 | Ht 65.0 in | Wt 189.6 lb

## 2022-09-24 DIAGNOSIS — D6869 Other thrombophilia: Secondary | ICD-10-CM | POA: Insufficient documentation

## 2022-09-24 DIAGNOSIS — I428 Other cardiomyopathies: Secondary | ICD-10-CM | POA: Diagnosis not present

## 2022-09-24 DIAGNOSIS — G4733 Obstructive sleep apnea (adult) (pediatric): Secondary | ICD-10-CM | POA: Diagnosis not present

## 2022-09-24 DIAGNOSIS — Z7901 Long term (current) use of anticoagulants: Secondary | ICD-10-CM | POA: Insufficient documentation

## 2022-09-24 DIAGNOSIS — I4819 Other persistent atrial fibrillation: Secondary | ICD-10-CM | POA: Insufficient documentation

## 2022-09-24 DIAGNOSIS — Z952 Presence of prosthetic heart valve: Secondary | ICD-10-CM | POA: Insufficient documentation

## 2022-09-24 DIAGNOSIS — I110 Hypertensive heart disease with heart failure: Secondary | ICD-10-CM | POA: Diagnosis not present

## 2022-09-24 DIAGNOSIS — I509 Heart failure, unspecified: Secondary | ICD-10-CM | POA: Diagnosis not present

## 2022-09-24 MED ORDER — APIXABAN 5 MG PO TABS: 5.0000 mg | ORAL_TABLET | Freq: Two times a day (BID) | ORAL | 1 refills | Status: DC

## 2022-09-24 MED ORDER — CARVEDILOL 6.25 MG PO TABS: 6.2500 mg | ORAL_TABLET | Freq: Two times a day (BID) | ORAL | 3 refills | Status: DC

## 2022-09-24 NOTE — Progress Notes (Signed)
Primary Care Physician: Chesley Noon, MD Referring Physician: Dr. 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 female with a h/o afib ablation 01/10/2017 by Dr. Rayann Heman, in the [facility] for f/u.  She had become refractory to amiodarone and developed  TCM with multiple admissions for CHF.She has done well since the procedure. She had a recent Linq implanted and on first Paceart report late May, did not have any arrhythmia noted.  Continues on apixaban without missed doses for chadsvasc score of at least 5. She is due for her physical in June and is due to have screening labs done.  Follow up in the AF clinic 09/24/22. Patient and her daughter report that she stopped all of her medications during the summer and did not want to follow up with any doctors. She has slowly been resuming her medications since October. The device clinic received an alert for ongoing afib with RVR showing nearly 100% burden. Patient remains in rapid afib today. She never resumed her BB. Of note, her thyroid medication is also being adjusted by her PCP.   Today, she denies symptoms of palpitations, chest pain, shortness of breath, orthopnea, PND, lower extremity edema, dizziness, presyncope, syncope, or neurologic sequela. The patient is tolerating medications without difficulties and is otherwise without complaint today.   Past Medical History:  Diagnosis Date   Anxiety    CHF (congestive heart failure) (HCC)    Chronic lower back pain    Chronic systolic dysfunction of left ventricle    Depression    Fatigue    GERD (gastroesophageal reflux disease)    High cholesterol    Hypertension    Hypothyroidism    Melanoma of shoulder, left (HCC)    Mild aortic stenosis    Nonischemic cardiomyopathy (HCC)    OSA on CPAP    reports compliance with CPAP   Osteoarthritis    Persistent atrial fibrillation with RVR ([place])    [name] 11/15/2016   PONV (postoperative nausea and vomiting)    RLS (restless  legs syndrome)    S/P ablation of atrial flutter/ fib 01/10/17 01/11/2017   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 Angelena Form & Dr. Cyndia Bent   Type II diabetes mellitus ([place])    no medications   Past Surgical History:  Procedure Laterality Date   ATRIAL FIBRILLATION ABLATION N/A 01/10/2017   Procedure: Atrial Fibrillation Ablation;  Surgeon: Thompson Grayer, MD;  Location: [place] [facility];  Service: Cardiovascular;  Laterality: N/A;   CARDIOVERSION N/A 11/19/2016   Procedure: CARDIOVERSION;  Surgeon: Lelon Perla, MD;  Location: [facility] ENDOSCOPY;  Service: Cardiovascular;  Laterality: N/A;   CARDIOVERSION N/A 12/11/2016   Procedure: CARDIOVERSION;  Surgeon: Dorothy Spark, MD;  Location: [place];  Service: Cardiovascular;  Laterality: N/A;   CARDIOVERSION N/A 12/18/2016   Procedure: CARDIOVERSION;  Surgeon: Skeet Latch, MD;  Location: [facility] ENDOSCOPY;  Service: Cardiovascular;  Laterality: N/A;   CATARACT EXTRACTION W/ INTRAOCULAR LENS  IMPLANT, BILATERAL Bilateral    DILATION AND CURETTAGE OF UTERUS     implantable loop recorder placement  12/27/2019   Medtronic Reveal Linq model IEP 32 (SN [id number]) implanted in office by Dr Rayann Heman   INTRAOPERATIVE TRANSTHORACIC ECHOCARDIOGRAM N/A 10/30/2021   Procedure: INTRAOPERATIVE TRANSTHORACIC ECHOCARDIOGRAM;  Surgeon: Burnell Blanks, MD;  Location: [place] [facility];  Service: Open Heart Surgery;  Laterality:  N/[place]ton Adam MYOVIEW  05/13/2012   No ECG changes. EKG negative for ischemia. No significant ischemia demonstrated.   MELANOMA EXCISION Left ~ 09/1979   "back of my shoulder"   RIGHT/LEFT HEART CATH AND CORONARY ANGIOGRAPHY N/A 09/17/2021   Procedure: RIGHT/LEFT HEART CATH AND CORONARY ANGIOGRAPHY;  Surgeon: Burnell Blanks, MD;  Location: [place] [facility];  Service: Cardiovascular;  Laterality: N/A;   TEE WITHOUT CARDIOVERSION N/A  11/19/2016   Procedure: TRANSESOPHAGEAL ECHOCARDIOGRAM (TEE);  Surgeon: Lelon Perla, MD;  Location: [facility] ENDOSCOPY;  Service: Cardiovascular;  Laterality: N/A;  need CV, but no anesthesia available   TEE WITHOUT CARDIOVERSION N/A 01/10/2017   Procedure: TRANSESOPHAGEAL ECHOCARDIOGRAM (TEE);  Surgeon: [name], MD;  Location: [facility] ENDOSCOPY;  Service: Cardiovascular;  Laterality: N/A;   TOTAL KNEE ARTHROPLASTY Right 07/28/2018   Procedure: RIGHT TOTAL KNEE ARTHROPLASTY;  Surgeon: Mcarthur Rossetti, MD;  Location: [place];  Service: Orthopedics;  Laterality: Right;   TRANSCATHETER AORTIC VALVE REPLACEMENT, TRANSFEMORAL N/A 10/30/2021   Procedure: TRANSCATHETER AORTIC VALVE REPLACEMENT, TRANSFEMORAL;  Surgeon: Burnell Blanks, MD;  Location: [place] [facility];  Service: Open Heart Surgery;  Laterality: N/A;   TRANSTHORACIC ECHOCARDIOGRAM  02/18/2012   EF >55%, mild aortic stenosis   VAGINAL HYSTERECTOMY  1985    Current Outpatient Medications  Medication Sig Dispense Refill   acetaminophen (TYLENOL) 500 MG tablet Take 1,000 mg by mouth every 6 (six) hours as needed for moderate pain.     ALPRAZolam (XANAX) 0.5 MG tablet Take 0.5 mg by mouth 2 (two) times daily.      amoxicillin (AMOXIL) 500 MG tablet Take 4 tablets by mouth 1 hour before dental procedures and cleanings 12 tablet 6   atorvastatin (LIPITOR) 20 MG tablet Take 1 tablet (20 mg total) by mouth every Monday, Wednesday, and Friday. 90 tablet 0   Calcium Carb-Cholecalciferol (CALCIUM 600 + D PO) Take 1 tablet by mouth 2 (two) times daily with a meal.     escitalopram (LEXAPRO) 10 MG tablet Take 10 mg by mouth daily.     hypromellose (SYSTANE OVERNIGHT THERAPY) 0.3 % GEL ophthalmic ointment Place 2 application into both eyes at bedtime.     levothyroxine (SYNTHROID) 75 MCG tablet Take 75 mcg by mouth daily before breakfast.     Magnesium 500 MG TABS Take 500 mg by mouth every Monday, Wednesday, and Friday.     metolazone  (ZAROXOLYN) 2.5 MG tablet Take 2.5 mg by mouth daily as needed for edema.     Multiple Vitamins-Minerals (PRESERVISION AREDS 2) CAPS Take 1 capsule by mouth 2 (two) times daily.     ONETOUCH ULTRA test strip      Polyethyl Glycol-Propyl Glycol (SYSTANE OP) Place 1-2 drops into both eyes 3 (three) times daily.     apixaban (ELIQUIS) 5 MG TABS tablet Take 1 tablet (5 mg total) by mouth 2 (two) times daily. 180 tablet 1   carvedilol (COREG) 6.25 MG tablet Take 1 tablet (6.25 mg total) by mouth 2 (two) times daily with a meal. 180 tablet 3   loratadine (CLARITIN) 10 MG tablet Take 10 mg by mouth daily. (Patient not taking: Reported on 09/24/2022)     pyridOXINE (VITAMIN B-6) 100 MG tablet Take 100 mg by mouth daily with supper. (Patient not taking: Reported on 09/24/2022)     rOPINIRole (REQUIP) 0.5 MG tablet Take 0.5-1 mg by mouth See admin instructions. Take 0.5 mg at lunch, 0.5 mg at supper, and 1  mg at bedtime (Patient not taking: Reported on 09/24/2022)     spironolactone (ALDACTONE) 25 MG tablet TAKE 1 TABLET (25 MG TOTAL) BY MOUTH DAILY. (Patient not taking: Reported on 09/24/2022) 90 tablet 2   zinc gluconate 50 MG tablet Take 50 mg by mouth daily. (Patient not taking: Reported on 09/24/2022)     No current facility-administered medications for this encounter.    Allergies  Allergen Reactions   Rosuvastatin Other (See Comments)    Joints ached  Joints ached  Joints ached  Other reaction(s): joints ache   Lovastatin Other (See Comments)    Joints hurt all over body     Social History   Socioeconomic History   Marital status: Widowed    Spouse name: Not on file   Number of children: 2   Years of education: Not on file   Highest education level: Not on file  Occupational History   Occupation: [employer]  Tobacco Use   Smoking status: Never   Smokeless tobacco: Never  Vaping Use   Vaping Use: Never used  Substance and Sexual Activity   Alcohol use: No    Drug use: Never   Sexual activity: Never  Other Topics Concern   Not on file  Social History Narrative   Not on file   Social Determinants of Health   Financial Resource Strain: Not on file  Food Insecurity: Not on file  Transportation Needs: Not on file  Physical Activity: Not on file  Stress: Not on file  Social Connections: Not on file  Intimate Partner Violence: Not on file    Family History  Problem Relation Age of Onset   Hypertension Mother    Diabetes Mother    Heart failure Father    Arrhythmia Sister    Hyperlipidemia Sister    Hypertension Sister    Healthy Brother    Stroke Paternal Grandmother    Cancer Paternal Grandfather    Healthy Daughter    Hypertension Son    Hyperlipidemia Son     ROS- All systems are reviewed and negative except as per the HPI above  Physical Exam: Vitals:   09/24/22 1404  BP: (!) 176/100  Pulse: (!) 153  Weight: 86 kg  Height: '5\' 5"'$  (1.651 m)   Wt Readings from Last 3 Encounters:  09/24/22 86 kg  12/06/21 86 kg  11/28/21 87.1 kg    Labs: Lab Results  Component Value Date   NA 135 10/31/2021   K 3.7 10/31/2021   CL 100 10/31/2021   CO2 28 10/31/2021   GLUCOSE 114 (H) 10/31/2021   BUN 21 10/31/2021   CREATININE 0.89 10/31/2021   CALCIUM 9.0 10/31/2021   MG 1.9 10/31/2021   Lab Results  Component Value Date   INR 1.0 10/26/2021   Lab Results  Component Value Date   CHOL 170 02/11/2017   HDL 82 02/11/2017   LDLCALC 69 02/11/2017   TRIG 93 02/11/2017     GEN- The patient is a well appearing elderly female, alert and oriented x 3 today.   HEENT-head normocephalic, atraumatic, sclera clear, conjunctiva pink, hearing intact, trachea midline. Lungs- Clear to ausculation bilaterally, normal work of breathing Heart- irregular rate and rhythm, tachycardia, no murmurs, rubs or gallops  GI- soft, NT, ND, + BS Extremities- no clubbing, cyanosis, or edema MS- no significant deformity or atrophy Skin- no rash  or lesion Psych- euthymic mood, full affect Neuro- strength and sensation are intact   EKG-  Afib  with RVR Vent. rate 153 BPM PR interval * ms QRS duration 86 ms QT/QTcB 252/402 ms   Echo 11/28/21  1. Left ventricular ejection fraction, by estimation, is 60 to 65%. The  left ventricle has normal function. The left ventricle has no regional  wall motion abnormalities. There is moderate concentric left ventricular  hypertrophy. Left ventricular diastolic parameters were normal.   2. Right ventricular systolic function is normal. The right ventricular  size is normal. There is normal pulmonary artery systolic pressure. The  estimated right ventricular systolic pressure is 53.2 mmHg.   3. The mitral valve is normal in structure. No evidence of mitral valve  regurgitation. No evidence of mitral stenosis.   4. The aortic valve has been repaired/replaced. Aortic valve  regurgitation is not visualized. There is a 23 mm Sapien prosthetic (TAVR)  valve present in the aortic position. Procedure Date: 10/30/2021. Echo  findings are consistent with normal structure  and function of the aortic valve prosthesis. Aortic valve mean gradient  measures 10.0 mmHg.   5. The inferior vena cava is normal in size with greater than 50%  respiratory variability, suggesting right atrial pressure of 3 mmHg.   Comparison(s): No significant change from prior study.   Conclusion(s)/Recommendation(s): S/P TAVR. Gradient improved from 15 mmHg  to 10 mmHg on current study. Valve area is different between studies, but  notably the stroke volume is significantly different between studies.    Epic records reviewed   CHA2DS2-VASc Score = 4  The patient's score is based upon: CHF History: 1 HTN History: 0 Diabetes History: 0 Stroke History: 0 Vascular Disease History: 0 Age Score: 2 Gender Score: 1       ASSESSMENT AND PLAN: 1. Persistent Atrial Fibrillation (ICD10:  I48.19) The patient's  CHA2DS2-VASc score is 4, indicating a 4.8% annual risk of stroke.   S/p ablation 2018 ILR shows 100% afib burden with poorly controlled rates.  We discussed rhythm control options today.  Resume carvedilol 6.25 mg BID and up titrate based on rate response.  Continue Eliquis 5 mg BID (resumed 09/07/22). She is reluctant to schedule a DCCV today. We discussed her history of NICM and the importance of good rate and rhythm control.  Thyroid medication being adjusted by PCP.   2. Secondary Hypercoagulable State (ICD10:  D68.69) The patient is at significant risk for stroke/thromboembolism based upon her CHA2DS2-VASc Score of 4.  Continue Apixaban (Eliquis).   3. NICM EF recovered with return of SR Fluid status appears stable today.  4. OSA Encouraged compliance with CPAP therapy.  5. HTN Elevated today, resuming BB as above.   6. Severe AS S/p TAVR 10/2021   Follow up in the AF clinic next week.    [facility] [address] [place], [place] [address] [phone]

## 2022-09-24 NOTE — Patient Instructions (Signed)
Start coreg 6.'25mg'$  twice a day

## 2022-10-02 ENCOUNTER — Encounter (HOSPITAL_COMMUNITY): Payer: Self-pay | Admitting: Nurse Practitioner

## 2022-10-02 ENCOUNTER — Ambulatory Visit (HOSPITAL_COMMUNITY)
Admission: RE | Admit: 2022-10-02 | Discharge: 2022-10-02 | Disposition: A | Discharge status: Home / Self Care | Payer: Medicare PPO | Source: Ambulatory Visit | Attending: Nurse Practitioner | Admitting: Nurse Practitioner

## 2022-10-02 VITALS — BP 156/96 | HR 110 | Ht 65.0 in | Wt 188.2 lb

## 2022-10-02 DIAGNOSIS — Z952 Presence of prosthetic heart valve: Secondary | ICD-10-CM | POA: Diagnosis not present

## 2022-10-02 DIAGNOSIS — D6869 Other thrombophilia: Secondary | ICD-10-CM | POA: Diagnosis not present

## 2022-10-02 DIAGNOSIS — G4733 Obstructive sleep apnea (adult) (pediatric): Secondary | ICD-10-CM | POA: Diagnosis not present

## 2022-10-02 DIAGNOSIS — I4819 Other persistent atrial fibrillation: Secondary | ICD-10-CM | POA: Insufficient documentation

## 2022-10-02 DIAGNOSIS — Z7901 Long term (current) use of anticoagulants: Secondary | ICD-10-CM | POA: Insufficient documentation

## 2022-10-02 DIAGNOSIS — I428 Other cardiomyopathies: Secondary | ICD-10-CM | POA: Diagnosis not present

## 2022-10-02 DIAGNOSIS — I509 Heart failure, unspecified: Secondary | ICD-10-CM | POA: Insufficient documentation

## 2022-10-02 DIAGNOSIS — I110 Hypertensive heart disease with heart failure: Secondary | ICD-10-CM | POA: Insufficient documentation

## 2022-10-02 MED ORDER — CARVEDILOL 6.25 MG PO TABS: 12.5000 mg | ORAL_TABLET | Freq: Two times a day (BID) | ORAL | 3 refills | Status: DC

## 2022-10-02 NOTE — Progress Notes (Signed)
Primary Care Physician: Chesley Noon, MD Referring Physician: Dr. 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 female with a h/o afib ablation 01/10/2017 by Dr. Rayann Heman, in the [facility] for f/u.  She had become refractory to amiodarone and developed  TCM with multiple admissions for CHF.She has done well since the procedure. She had a recent Linq implanted and on first Paceart report late May, did not have any arrhythmia noted.  Continues on apixaban without missed doses for chadsvasc score of at least 5. She is due for her physical in June and is due to have screening labs done.  Follow up in the AF clinic 09/24/22. Patient and her daughter report that she stopped all of her medications during the summer and did not want to follow up with any doctors. She has slowly been resuming her medications since October. The device clinic received an alert for ongoing afib with RVR showing nearly 100% burden. Patient remains in rapid afib today. She never resumed her BB. Of note, her thyroid medication is also being adjusted by her PCP.   F/u in afib clinic, 10/02/22.  She was seen by R. Fenton last week in afib and did resume BB as sordered,  but continues in afib today with RVR. She is here with her son-in-law and granddaughter today. She is tired and winded. She did miss 1-2 doses of anticoagulation first week of December. She did not want to pursue cardioversion last week when she was seen by [place]. Fenton Today, she is still non committal to a CV. She is open to increase BB again today and schedule CV  in several weeks after appropriate amount of time on anticoagulation.    Today, she denies symptoms of palpitations, chest pain, shortness of breath, orthopnea, PND, lower extremity edema, dizziness, presyncope, syncope, or neurologic sequela. The patient is tolerating medications without difficulties and is otherwise without complaint today.   Past Medical History:  Diagnosis Date    Anxiety    CHF (congestive heart failure) (HCC)    Chronic lower back pain    Chronic systolic dysfunction of left ventricle    Depression    Fatigue    GERD (gastroesophageal reflux disease)    High cholesterol    Hypertension    Hypothyroidism    Melanoma of shoulder, left (HCC)    Mild aortic stenosis    Nonischemic cardiomyopathy (HCC)    OSA on CPAP    reports compliance with CPAP   Osteoarthritis    Persistent atrial fibrillation with RVR ([place])    [name] 11/15/2016   PONV (postoperative nausea and vomiting)    RLS (restless legs syndrome)    S/P ablation of atrial flutter/ fib 01/10/17 01/11/2017   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 Angelena Form & Dr. Cyndia Bent   Type II diabetes mellitus ([place])    no medications   Past Surgical History:  Procedure Laterality Date   ATRIAL FIBRILLATION ABLATION N/A 01/10/2017   Procedure: Atrial Fibrillation Ablation;  Surgeon: Thompson Grayer, MD;  Location: [facility];  Service: Cardiovascular;  Laterality: N/A;   CARDIOVERSION N/A 11/19/2016   Procedure: CARDIOVERSION;  Surgeon: Lelon Perla, MD;  Location: [facility] ENDOSCOPY;  Service: Cardiovascular;  Laterality: N/A;   CARDIOVERSION N/A 12/11/2016   Procedure: CARDIOVERSION;  Surgeon: Dorothy Spark, MD;  Location: [facility];  Service: Cardiovascular;  Laterality: N/A;   CARDIOVERSION N/A 12/18/2016   Procedure: CARDIOVERSION;  Surgeon: Skeet Latch, MD;  Location: [facility] ENDOSCOPY;  Service: Cardiovascular;  Laterality: N/A;   CATARACT EXTRACTION W/ INTRAOCULAR LENS  IMPLANT, BILATERAL Bilateral    DILATION AND CURETTAGE OF UTERUS     implantable loop recorder placement  12/27/2019   Medtronic Reveal Linq model LNQ 25 (SN [id number]) implanted in office by Dr Rayann Heman   INTRAOPERATIVE TRANSTHORACIC ECHOCARDIOGRAM N/A 10/30/2021   Procedure: INTRAOPERATIVE TRANSTHORACIC ECHOCARDIOGRAM;  Surgeon:  Burnell Blanks, MD;  Location: [facility];  Service: Open Heart Surgery;  Laterality: N/A;   LEXISCAN MYOVIEW  05/13/2012   No ECG changes. EKG negative for ischemia. No significant ischemia demonstrated.   MELANOMA EXCISION Left ~ 09/1979   "back of my shoulder"   RIGHT/LEFT HEART CATH AND CORONARY ANGIOGRAPHY N/A 09/17/2021   Procedure: RIGHT/LEFT HEART CATH AND CORONARY ANGIOGRAPHY;  Surgeon: Burnell Blanks, MD;  Location: [facility];  Service: Cardiovascular;  Laterality: N/A;   TEE WITHOUT CARDIOVERSION N/A 11/19/2016   Procedure: TRANSESOPHAGEAL ECHOCARDIOGRAM (TEE);  Surgeon: Lelon Perla, MD;  Location: [facility] ENDOSCOPY;  Service: Cardiovascular;  Laterality: N/A;  need CV, but no anesthesia available   TEE WITHOUT CARDIOVERSION N/A 01/10/2017   Procedure: TRANSESOPHAGEAL ECHOCARDIOGRAM (TEE);  Surgeon: [name], MD;  Location: [facility] ENDOSCOPY;  Service: Cardiovascular;  Laterality: N/A;   TOTAL KNEE ARTHROPLASTY Right 07/28/2018   Procedure: RIGHT TOTAL KNEE ARTHROPLASTY;  Surgeon: Mcarthur Rossetti, MD;  Location: [facility];  Service: Orthopedics;  Laterality: Right;   TRANSCATHETER AORTIC VALVE REPLACEMENT, TRANSFEMORAL N/A 10/30/2021   Procedure: TRANSCATHETER AORTIC VALVE REPLACEMENT, TRANSFEMORAL;  Surgeon: Burnell Blanks, MD;  Location: [facility];  Service: Open Heart Surgery;  Laterality: N/A;   TRANSTHORACIC ECHOCARDIOGRAM  02/18/2012   EF >55%, mild aortic stenosis   VAGINAL HYSTERECTOMY  1985    Current Outpatient Medications  Medication Sig Dispense Refill   acetaminophen (TYLENOL) 500 MG tablet Take 1,000 mg by mouth every 6 (six) hours as needed for moderate pain.     ALPRAZolam (XANAX) 0.5 MG tablet Take 0.5 mg by mouth 2 (two) times daily.      amoxicillin (AMOXIL) 500 MG tablet Take 4 tablets by mouth 1 hour before dental procedures and cleanings 12 tablet 6   apixaban (ELIQUIS) 5 MG TABS tablet Take 1 tablet (5 mg  total) by mouth 2 (two) times daily. 180 tablet 1   Calcium Carb-Cholecalciferol (CALCIUM 600 + D PO) Take 1 tablet by mouth 2 (two) times daily with a meal.     carvedilol (COREG) 6.25 MG tablet Take 1 tablet (6.25 mg total) by mouth 2 (two) times daily with a meal. 180 tablet 3   escitalopram (LEXAPRO) 10 MG tablet Take 10 mg by mouth daily.     hypromellose (SYSTANE OVERNIGHT THERAPY) 0.3 % GEL ophthalmic ointment Place 2 application into both eyes at bedtime.     levothyroxine (SYNTHROID) 75 MCG tablet Take 75 mcg by mouth daily before breakfast.     metolazone (ZAROXOLYN) 2.5 MG tablet Take 2.5 mg by mouth daily as needed for edema.     Multiple Vitamins-Minerals (PRESERVISION AREDS 2) CAPS Take 1 capsule by mouth 2 (two) times daily.     ONETOUCH ULTRA test strip      Polyethyl Glycol-Propyl Glycol (SYSTANE OP) Place 1-2 drops into both eyes 3 (three) times daily.     atorvastatin (LIPITOR) 20 MG tablet Take  1 tablet (20 mg total) by mouth every Monday, Wednesday, and Friday. (Patient not taking: Reported on 10/02/2022) 90 tablet 0   loratadine (CLARITIN) 10 MG tablet Take 10 mg by mouth daily. (Patient not taking: Reported on 09/24/2022)     Magnesium 500 MG TABS Take 500 mg by mouth every Monday, Wednesday, and Friday. (Patient not taking: Reported on 10/02/2022)     pyridOXINE (VITAMIN B-6) 100 MG tablet Take 100 mg by mouth daily with supper. (Patient not taking: Reported on 09/24/2022)     rOPINIRole (REQUIP) 0.5 MG tablet Take 0.5-1 mg by mouth See admin instructions. Take 0.5 mg at lunch, 0.5 mg at supper, and 1 mg at bedtime (Patient not taking: Reported on 09/24/2022)     spironolactone (ALDACTONE) 25 MG tablet TAKE 1 TABLET (25 MG TOTAL) BY MOUTH DAILY. (Patient not taking: Reported on 09/24/2022) 90 tablet 2   zinc gluconate 50 MG tablet Take 50 mg by mouth daily. (Patient not taking: Reported on 09/24/2022)     No current facility-administered medications for this encounter.     Allergies  Allergen Reactions   Rosuvastatin Other (See Comments)    Joints ached  Joints ached  Joints ached  Other reaction(s): joints ache   Lovastatin Other (See Comments)    Joints hurt all over body     Social History   Socioeconomic History   Marital status: Widowed    Spouse name: Not on file   Number of children: 2   Years of education: Not on file   Highest education level: Not on file  Occupational History   Occupation: [employer]  Tobacco Use   Smoking status: Never   Smokeless tobacco: Never  Vaping Use   Vaping Use: Never used  Substance and Sexual Activity   Alcohol use: No   Drug use: Never   Sexual activity: Never  Other Topics Concern   Not on file  Social History Narrative   Not on file   Social Determinants of Health   Financial Resource Strain: Not on file  Food Insecurity: Not on file  Transportation Needs: Not on file  Physical Activity: Not on file  Stress: Not on file  Social Connections: Not on file  Intimate Partner Violence: Not on file    Family History  Problem Relation Age of Onset   Hypertension Mother    Diabetes Mother    Heart failure Father    Arrhythmia Sister    Hyperlipidemia Sister    Hypertension Sister    Healthy Brother    Stroke Paternal Grandmother    Cancer Paternal Grandfather    Healthy Daughter    Hypertension Son    Hyperlipidemia Son     ROS- All systems are reviewed and negative except as per the HPI above  Physical Exam: Vitals:   10/02/22 1509  BP: (!) 156/96  Pulse: (!) 110  Weight: 85.4 kg  Height: '5\' 5"'$  (1.651 m)   Wt Readings from Last 3 Encounters:  10/02/22 85.4 kg  09/24/22 86 kg  12/06/21 86 kg    Labs: Lab Results  Component Value Date   NA 135 10/31/2021   K 3.7 10/31/2021   CL 100 10/31/2021   CO2 28 10/31/2021   GLUCOSE 114 (H) 10/31/2021   BUN 21 10/31/2021   CREATININE 0.89 10/31/2021   CALCIUM 9.0 10/31/2021   MG 1.9 10/31/2021    Lab Results  Component Value Date   INR 1.0 10/26/2021   Lab Results  Component Value Date   CHOL 170 02/11/2017   HDL 82 02/11/2017   LDLCALC 69 02/11/2017   TRIG 93 02/11/2017     GEN- The patient is a well appearing elderly female, alert and oriented x 3 today.   HEENT-head normocephalic, atraumatic, sclera clear, conjunctiva pink, hearing intact, trachea midline. Lungs- Clear to ausculation bilaterally, normal work of breathing Heart- irregular rate and rhythm, tachycardia, no murmurs, rubs or gallops  GI- soft, NT, ND, + BS Extremities- no clubbing, cyanosis, or edema MS- no significant deformity or atrophy Skin- no rash or lesion Psych- euthymic mood, full affect Neuro- strength and sensation are intact   EKG- Vent. rate 110 BPM PR interval * ms QRS duration 90 ms QT/QTcB 310/419 ms P-R-T axes * -41 -48 Atrial fibrillation with rapid ventricular response Left axis deviation Anteroseptal infarct , age undetermined Abnormal ECG When compared with ECG of 24-Sep-2022 14:35, PREVIOUS ECG IS PRESENT    Echo 11/28/21  1. Left ventricular ejection fraction, by estimation, is 60 to 65%. The  left ventricle has normal function. The left ventricle has no regional  wall motion abnormalities. There is moderate concentric left ventricular  hypertrophy. Left ventricular diastolic parameters were normal.   2. Right ventricular systolic function is normal. The right ventricular  size is normal. There is normal pulmonary artery systolic pressure. The  estimated right ventricular systolic pressure is 27.7 mmHg.   3. The mitral valve is normal in structure. No evidence of mitral valve  regurgitation. No evidence of mitral stenosis.   4. The aortic valve has been repaired/replaced. Aortic valve  regurgitation is not visualized. There is a 23 mm Sapien prosthetic (TAVR)  valve present in the aortic position. Procedure Date: 10/30/2021. Echo  findings are consistent with normal  structure  and function of the aortic valve prosthesis. Aortic valve mean gradient  measures 10.0 mmHg.   5. The inferior vena cava is normal in size with greater than 50%  respiratory variability, suggesting right atrial pressure of 3 mmHg.   Comparison(s): No significant change from prior study.   Conclusion(s)/Recommendation(s): S/P TAVR. Gradient improved from 15 mmHg  to 10 mmHg on current study. Valve area is different between studies, but  notably the stroke volume is significantly different between studies.    Epic records reviewed   CHA2DS2-VASc Score = 4  The patient's score is based upon: CHF History: 1 HTN History: 0 Diabetes History: 0 Stroke History: 0 Vascular Disease History: 0 Age Score: 2 Gender Score: 1       ASSESSMENT AND PLAN: 1. Persistent Atrial Fibrillation (ICD10:  I48.19) The patient's CHA2DS2-VASc score is 4, indicating a 4.8% annual risk of stroke.   S/p ablation 2018 ILR shows 100% afib burden with poorly controlled rates as she stopped all meds several months ago  Increase  carvedilol to  6.25 mg  (2) in am and (2) in pm   I will go ahead and schedule cardioversion for  10/23/22, after she sees Dr. Quentin Ore on 1/10. She still is unsure if she wants to proceed with cardioversion, but it can be further discussed with Dr. Quentin Ore on 1/10, if she does not convert with higher dose of BB Continue Eliquis 5 mg BID (resumed 09/07/22). She did miss a few doses the first week  of December, but feels she has not missed any since 12/7. We discussed her history of NICM and the importance of good rate and rhythm control.  Thyroid medication being adjusted by PCP.  2. Secondary Hypercoagulable State (ICD10:  D68.69) The patient is at significant risk for stroke/thromboembolism based upon her CHA2DS2-VASc Score of 4.  Continue Apixaban (Eliquis).   3. NICM EF recovered with return of SR Fluid status appears stable today.  4. OSA Encouraged compliance with  CPAP therapy.  5. HTN Elevated today, resuming BB as above.   6. Severe AS S/p TAVR 10/2021 She has an echo and one year f/u on 1/3 for this    Follow up in the AF clinic one week after CV  Butch Penny C. Novah Nessel, [facility] [address] [place], [place] [address] [phone]

## 2022-10-02 NOTE — Patient Instructions (Addendum)
Increase coreg to 12.'5mg'$  twice a day (2 of your 6.'25mg'$  tablets twice a day)  Cardioversion scheduled for: Wednesday, January 17th   - Arrive at the [facility] and go to admitting at 930am   - Do not eat or drink anything after midnight the night prior to your procedure.   - Take all your morning medication (except diabetic medications) with a sip of water prior to arrival.  - You will not be able to drive home after your procedure.    - Do NOT miss any doses of your blood thinner - if you should miss a dose please notify our office immediately.   - If you feel as if you go back into normal rhythm prior to scheduled cardioversion, please notify our office immediately.  If your procedure is canceled in the cardioversion suite you will be charged a cancellation fee.

## 2022-10-08 NOTE — Progress Notes (Signed)
Jenna [name]                                     Cardiology Office Note:    Date:  10/11/2022   ID:  VALLEY KE, DOB [birth year], MRN [id number]  PCP:  Chesley Noon, MD  [facility] Cardiologist:  [name], MD / Dr. Angelena Form, MD & Dr. Cyndia Bent, MD (TAVR) [facility] Electrophysiologist:  None   Referring MD: Chesley Noon, MD   1 year s/p TAVR  History of Present Illness:    Jenna [name] is a 82 y.o. female with a hx of HFimpEF, NICM, persistent atrial fibrillation on Eliquis s/p ablation in 2018, sleep apnea on CPAP, CKD stage III, iron deficiency anemia, HLD, depression and severe aortic stenosis s/p TAVR (10/30/21) who presents to clinic for follow up.  She underwent atrial fibrillation ablation in April 2018 and maintained normal sinus rhythm. EF previous as low as 25%. Follow up echo showed improved EF with restoration of sinus. However due to recurrence of palpitations an ILR was implanted. This showed no recurrence of afib for a long time.   Echo 10/16/20 showed LVEF at 60-65% with progression to severe aortic stenosis with a mean gradient 32 mmHg, peak gradient 57.8 mmHg, AVA 0.52 cm2, dimensionless index 0.17. She was referred to structural heart and subsequent cardiac catheterization 09/17/21 showed mild, non-obstructive CAD with mild pulm HTN and confirmation of severe AS with a mean gradient 27.3 mmHg, peak to peak gradient 32 mmHg, AVA 0.66 cm2.    She was evaluated by the multidisciplinary valve team and underwent successful TAVR with a 23 mm Edwards Sapien 3 Ultra Resilia THV via the TF approach on 10/30/21. Post operative echo showed EF 65-70%, mod concentric LVH, normally functioning TAVR with a mean gradient of 15 mmHg and no PVL. She was noted to have a lump on her right wrist where she had a cath but follow up US showed no PSA, just hematoma. She was discharged on home Eliquis. 1 month echo showed EF 60%,  normally functioning TAVR with a mean gradient of 10 mm hg and no PVL.   She was in her usual state of health until this summer when she quit taking all of her medications and refused to see any medical providers. She has been recently seen in the afib clinic and noted to be in continuous afib with RVR. She was initially reluctant to proceed with DCCV, but now this is set up for 10/13/22 given a couple missed doses of Eliquis.   Today the patient presents to clinic for follow up. Here with her daughter. Doing pretty well. No CP. Has chronic dyspnea on exertion that is at her baseline. No LE edema, orthopnea or PND. Occasional dizziness but no syncope. No blood in stool or urine. No palpitations.    Past Medical History:  Diagnosis Date   Anxiety    CHF (congestive heart failure) (HCC)    Chronic lower back pain    Chronic systolic dysfunction of left ventricle    Depression    Fatigue    GERD (gastroesophageal reflux disease)    High cholesterol    Hypertension    Hypothyroidism    Melanoma of shoulder, left (HCC)    Mild aortic stenosis    Nonischemic cardiomyopathy (HCC)    OSA on CPAP  reports compliance with CPAP   Osteoarthritis    Persistent atrial fibrillation with RVR (HCC)    [name] 11/15/2016   PONV (postoperative nausea and vomiting)    RLS (restless legs syndrome)    S/P ablation of atrial flutter/ fib 01/10/17 01/11/2017   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 Angelena Form & Dr. Cyndia Bent   Type II diabetes mellitus ([place])    no medications    Past Surgical History:  Procedure Laterality Date   ATRIAL FIBRILLATION ABLATION N/A 01/10/2017   Procedure: Atrial Fibrillation Ablation;  Surgeon: Chantel Teti Grayer, MD;  Location: [place] [facility];  Service: Cardiovascular;  Laterality: N/A;   CARDIOVERSION N/A 11/19/2016   Procedure: CARDIOVERSION;  Surgeon: Lelon Perla, MD;  Location: [facility]  ENDOSCOPY;  Service: Cardiovascular;  Laterality: N/A;   CARDIOVERSION N/A 12/11/2016   Procedure: CARDIOVERSION;  Surgeon: Dorothy Spark, MD;  Location: [place];  Service: Cardiovascular;  Laterality: N/A;   CARDIOVERSION N/A 12/18/2016   Procedure: CARDIOVERSION;  Surgeon: Skeet Latch, MD;  Location: [facility] ENDOSCOPY;  Service: Cardiovascular;  Laterality: N/A;   CATARACT EXTRACTION W/ INTRAOCULAR LENS  IMPLANT, BILATERAL Bilateral    DILATION AND CURETTAGE OF UTERUS     implantable loop recorder placement  12/27/2019   Medtronic Reveal Linq model DVV 61 (SN [id number]) implanted in office by Dr Rayann Heman   INTRAOPERATIVE TRANSTHORACIC ECHOCARDIOGRAM N/A 10/30/2021   Procedure: INTRAOPERATIVE TRANSTHORACIC ECHOCARDIOGRAM;  Surgeon: Burnell Blanks, MD;  Location: [place] [facility];  Service: Open Heart Surgery;  Laterality: N/A;   LEXISCAN MYOVIEW  05/13/2012   No ECG changes. EKG negative for ischemia. No significant ischemia demonstrated.   MELANOMA EXCISION Left ~ 09/1979   "back of my shoulder"   RIGHT/LEFT HEART CATH AND CORONARY ANGIOGRAPHY N/A 09/17/2021   Procedure: RIGHT/LEFT HEART CATH AND CORONARY ANGIOGRAPHY;  Surgeon: Burnell Blanks, MD;  Location: [place] [facility];  Service: Cardiovascular;  Laterality: N/A;   TEE WITHOUT CARDIOVERSION N/A 11/19/2016   Procedure: TRANSESOPHAGEAL ECHOCARDIOGRAM (TEE);  Surgeon: Lelon Perla, MD;  Location: [facility] ENDOSCOPY;  Service: Cardiovascular;  Laterality: N/A;  need CV, but no anesthesia available   TEE WITHOUT CARDIOVERSION N/A 01/10/2017   Procedure: TRANSESOPHAGEAL ECHOCARDIOGRAM (TEE);  Surgeon: [name], MD;  Location: [facility] ENDOSCOPY;  Service: Cardiovascular;  Laterality: N/A;   TOTAL KNEE ARTHROPLASTY Right 07/28/2018   Procedure: RIGHT TOTAL KNEE ARTHROPLASTY;  Surgeon: Mcarthur Rossetti, MD;  Location: [place];  Service: Orthopedics;  Laterality: Right;   TRANSCATHETER AORTIC VALVE REPLACEMENT,  TRANSFEMORAL N/A 10/30/2021   Procedure: TRANSCATHETER AORTIC VALVE REPLACEMENT, TRANSFEMORAL;  Surgeon: Burnell Blanks, MD;  Location: [place] [facility];  Service: Open Heart Surgery;  Laterality: N/A;   TRANSTHORACIC ECHOCARDIOGRAM  02/18/2012   EF >55%, mild aortic stenosis   VAGINAL HYSTERECTOMY  1985    Current Medications: Current Meds  Medication Sig   acetaminophen (TYLENOL) 500 MG tablet Take 1,000 mg by mouth every 6 (six) hours as needed for moderate pain.   ALPRAZolam (XANAX) 0.5 MG tablet Take 0.5 mg by mouth 2 (two) times daily.    amoxicillin (AMOXIL) 500 MG tablet Take 4 tablets by mouth 1 hour before dental procedures and cleanings   apixaban (ELIQUIS) 5 MG TABS tablet Take 1 tablet (5 mg total) by mouth 2 (two) times daily.   atorvastatin (LIPITOR) 20 MG tablet Take 1 tablet (20  mg total) by mouth every Monday, Wednesday, and Friday.   Calcium Carb-Cholecalciferol (CALCIUM 600 + D PO) Take 1 tablet by mouth 2 (two) times daily with a meal.   carvedilol (COREG) 6.25 MG tablet Take 2 tablets (12.5 mg total) by mouth 2 (two) times daily with a meal.   escitalopram (LEXAPRO) 10 MG tablet Take 10 mg by mouth daily.   hypromellose (SYSTANE OVERNIGHT THERAPY) 0.3 % GEL ophthalmic ointment Place 2 application into both eyes at bedtime.   levothyroxine (SYNTHROID) 75 MCG tablet Take 75 mcg by mouth daily before breakfast.   loratadine (CLARITIN) 10 MG tablet Take 10 mg by mouth daily.   Magnesium 500 MG TABS Take 500 mg by mouth every Monday, Wednesday, and Friday.   metolazone (ZAROXOLYN) 2.5 MG tablet Take 2.5 mg by mouth daily as needed for edema.   Multiple Vitamins-Minerals (PRESERVISION AREDS 2) CAPS Take 1 capsule by mouth 2 (two) times daily.   ONETOUCH ULTRA test strip    Polyethyl Glycol-Propyl Glycol (SYSTANE OP) Place 1-2 drops into both eyes 3 (three) times daily.   pyridOXINE (VITAMIN B-6) 100 MG tablet Take 100 mg by mouth daily with supper.   rOPINIRole  (REQUIP) 0.5 MG tablet Take 0.5-1 mg by mouth See admin instructions. Take 0.5 mg at lunch, 0.5 mg at supper, and 1 mg at bedtime   spironolactone (ALDACTONE) 25 MG tablet TAKE 1 TABLET (25 MG TOTAL) BY MOUTH DAILY.   zinc gluconate 50 MG tablet Take 50 mg by mouth daily.     Allergies:   Rosuvastatin and Lovastatin   Social History   Socioeconomic History   Marital status: Widowed    Spouse name: Not on file   Number of children: 2   Years of education: Not on file   Highest education level: Not on file  Occupational History   Occupation: [employer]  Tobacco Use   Smoking status: Never   Smokeless tobacco: Never  Vaping Use   Vaping Use: Never used  Substance and Sexual Activity   Alcohol use: No   Drug use: Never   Sexual activity: Never  Other Topics Concern   Not on file  Social History Narrative   Not on file   Social Determinants of Health   Financial Resource Strain: Not on file  Food Insecurity: Not on file  Transportation Needs: Not on file  Physical Activity: Not on file  Stress: Not on file  Social Connections: Not on file     Family History: The patient's family history includes Arrhythmia in her sister; Cancer in her paternal grandfather; Diabetes in her mother; Healthy in her brother and daughter; Heart failure in her father; Hyperlipidemia in her sister and son; Hypertension in her mother, sister, and son; Stroke in her paternal grandmother.  ROS:   Please see the history of present illness.    All other systems reviewed and are negative.  EKGs/Labs/Other Studies Reviewed:    The following studies were reviewed today:  TAVR OPERATIVE NOTE     Date of Procedure:                10/30/2021   Preoperative Diagnosis:      Severe Aortic Stenosis    Postoperative Diagnosis:    Same    Procedure:        Transcatheter Aortic Valve Replacement - Percutaneous Right Transfemoral Approach             Edwards Sapien 3 Ultra Resilia  THV (  size 23 mm, model # 9755RSL, serial # [id number])              Co-Surgeons:                        Gaye Pollack, MD and  Lauree Chandler, MD     Anesthesiologist:                  Raechel Ache, MD   Echocardiographer:              Osborne Oman, MD   Pre-operative Echo Findings: Severe aortic stenosis Normal left ventricular systolic function   Post-operative Echo Findings: No paravalvular leak Normal left ventricular systolic function   _____________   Echo 10/31/21 IMPRESSIONS   1. The aortic valve has been replaced. Aortic valve regurgitation is not  visualized (no PVL). Effective orifice area index, by VTI measures 1.28  cm. Aortic valve mean gradient measures 15.0 mmHg. Peak gradient 28 mm  Hg. DVI 0.75. Acceleration time 90  ms.   2. Left ventricular ejection fraction, by estimation, is 65 to 70%. The  left ventricle has normal function. The left ventricle has no regional  wall motion abnormalities. There is moderate concentric left ventricular  hypertrophy. Left ventricular  diastolic parameters are consistent with Grade I diastolic dysfunction  (impaired relaxation).   3. Right ventricular systolic function is normal. The right ventricular  size is normal. There is normal pulmonary artery systolic pressure.   4. The mitral valve is normal in structure. No evidence of mitral valve  regurgitation. No evidence of mitral stenosis.   Comparison(s): Increase in gradients but stable prosthetic parameters.   ______________________  10/30/21 VAS [place] UE ARTERIAL DUPLEX Summary:     Right: 1.6 x 1.1 x 1.5cm heterogenous area visualized in the distal         right upper extremity, adjacent to the radial artery in the         area of recent cardiac catheterization with no internal         vascularity. This is suggestive of a hematoma. >50% stenosis         visualized in the right radial artery, likely related to         hematoma compressing radial artery.    _________________________  Echo 11/28/21 IMPRESSIONS   1. Left ventricular ejection fraction, by estimation, is 60 to 65%. The  left ventricle has normal function. The left ventricle has no regional  wall motion abnormalities. There is moderate concentric left ventricular  hypertrophy. Left ventricular  diastolic parameters were normal.   2. Right ventricular systolic function is normal. The right ventricular  size is normal. There is normal pulmonary artery systolic pressure. The  estimated right ventricular systolic pressure is 44.3 mmHg.   3. The mitral valve is normal in structure. No evidence of mitral valve  regurgitation. No evidence of mitral stenosis.   4. The aortic valve has been repaired/replaced. Aortic valve  regurgitation is not visualized. There is a 23 mm Sapien prosthetic (TAVR)  valve present in the aortic position. Procedure Date: 10/30/2021. Echo  findings are consistent with normal structure  and function of the aortic valve prosthesis. Aortic valve mean gradient  measures 10.0 mmHg.   5. The inferior vena cava is normal in size with greater than 50%  respiratory variability, suggesting right atrial pressure of 3 mmHg.   Comparison(s): No significant change from prior study.   Conclusion(s)/Recommendation(s): S/P  TAVR. Gradient improved from 15 mmHg  to 10 mmHg on current study. Valve area is different between studies, but  notably the stroke volume is significantly different between studies.   __________________________  Echo 10/09/22 IMPRESSIONS   1. Left ventricular ejection fraction, by estimation, is 50 to 55%. The left ventricle has low normal function. The left ventricle demonstrates global hypokinesis. There is mild left ventricular hypertrophy. Left ventricular diastolic function could not  be evaluated.  2. Right ventricular systolic function is normal. The right ventricular size is normal. There is normal pulmonary artery systolic pressure.  3.  Left atrial size was mildly dilated.  4. The mitral valve is normal in structure. Mild mitral valve regurgitation. No evidence of mitral stenosis.  5. The aortic valve has been repaired/replaced. Aortic valve regurgitation is not visualized. There is a 23 mm Sapien prosthetic (TAVR) valve present in the aortic position. Procedure Date: 10/30/2021. Echo findings are consistent with normal structure  and function of the aortic valve prosthesis. Aortic valve mean gradient measures 9.0 mmHg.  6. The inferior vena cava is normal in size with <50% respiratory variability, suggesting right atrial pressure of 8 mmHg.   Comparison(s): Changes from prior study are noted. Significant differences in stroke volume between the last 3 echoes, which likely explains differences in AVA. Echo 10/31/21 showed SV 127 ml, AVA 2.44 cm2; echo 11/28/21 showed SV 71 ml, AVA 1.33 cm2.  Current SV 29 ml, AVA 0.76 cm2.   Conclusion(s)/Recommendation(s): Patient in atrial fibrillation with RVR during current study. Consider repeating study in sinus rhythm or with better rate control if clinically indicated.  EKG:  EKG is NOT ordered today.    Recent Labs: 10/26/2021: ALT 23; B Natriuretic Peptide 123.6 10/31/2021: BUN 21; Creatinine, Ser 0.89; Hemoglobin 11.8; Magnesium 1.9; Platelets 209; Potassium 3.7; Sodium 135  Recent Lipid Panel    Component Value Date/Time   CHOL 170 02/11/2017 0941   TRIG 93 02/11/2017 0941   HDL 82 02/11/2017 0941   CHOLHDL 2.1 02/11/2017 0941   VLDL 19 02/11/2017 0941   LDLCALC 69 02/11/2017 0941     Risk Assessment/Calculations:    CHA2DS2-VASc Score = 4   This indicates a 4.8% annual risk of stroke. The patient's score is based upon: CHF History: 1 HTN History: 0 Diabetes History: 0 Stroke History: 0 Vascular Disease History: 0 Age Score: 2 Gender Score: 1      Physical Exam:    VS:  BP (!) 140/82   Pulse (!) 101   Ht '5\' 5"'$  (1.651 m)   Wt 189 lb (85.7 kg)   SpO2 97%    BMI 31.45 kg/m     Wt Readings from Last 3 Encounters:  10/09/22 189 lb (85.7 kg)  10/02/22 188 lb 3.2 oz (85.4 kg)  09/24/22 189 lb 9.6 oz (86 kg)     GEN:  Well nourished, well developed in no acute distress HEENT: Normal NECK: No JVD LYMPHATICS: No lymphadenopathy CARDIAC: irreg irreg, mildly tachycardic. No murmurs, rubs, gallops RESPIRATORY:  Clear to auscultation without rales, wheezing or rhonchi  ABDOMEN: Soft, non-tender, non-distended MUSCULOSKELETAL:  No edema; No deformity  SKIN: Warm and dry.  NEUROLOGIC:  Alert and oriented x 3 PSYCHIATRIC:  Normal affect   ASSESSMENT:    1. S/P TAVR (transcatheter aortic valve replacement)   2. Heart failure with improved ejection fraction (HFimpEF) (HCC)   3. Persistent atrial fibrillation (HCC)     PLAN:    In order of problems listed above:  Severe AS s/p TAVR: echo today shows EF 50%, normally functioning TAVR with a mean gradient of 9 mm hg and no PVL. She has NYHA class II symptoms, with chronic DOE. SBE prophylaxis discussed; she has amoxicillin. Continue Eliquis '5mg'$  BID.   HFimpEF/NICM: EF previously normalized. Appears euvolemic.    Persistent atrial fibrillation s/p atrial fibrillation ablation in 2018: maintained sinus for many years but came off all her meds this summer and converted into persistent afib with poorly controlled rates. BB has been escalated. She is set up for DCCV on 1/17 and see Dr. Quentin Ore on 1/10. Continue Eliquis without interruption. Encouraged her to keep cardioversion on the books.     Medication Adjustments/Labs and Tests Ordered: Current medicines are reviewed at length with the patient today.  Concerns regarding medicines are outlined above.  No orders of the defined types were placed in this encounter.  No orders of the defined types were placed in this encounter.   Patient Instructions  Medication Instructions:  Your physician recommends that you continue on your current medications  as directed. Please refer to the Current Medication list given to you today.  *If you need a refill on your cardiac medications before your next appointment, please call your pharmacy*   Lab Work: NONE  If you have labs (blood work) drawn today and your tests are completely normal, you will receive your results only by: [place] (if you have MyChart) OR A paper copy in the mail If you have any lab test that is abnormal or we need to change your treatment, we will call you to review the results.   Testing/Procedures: NONE   Follow-Up: At [facility], you and your health needs are our priority.  As part of our continuing mission to provide you with exceptional heart care, we have created designated Provider Care Teams.  These Care Teams include your primary Cardiologist (physician) and Advanced Practice Providers (APPs -  Physician Assistants and Nurse Practitioners) who all work together to provide you with the care you need, when you need it.  We recommend signing up for the patient portal called "MyChart".  Sign up information is provided on this After Visit Summary.  MyChart is used to connect with patients for Virtual Visits (Telemedicine).  Patients are able to view lab/test results, encounter notes, upcoming appointments, etc.  Non-urgent messages can be sent to your provider as well.   To learn more about what you can do with MyChart, go to [email or web address].    Your next appointment:   KEEP SCHEDULED FOLLOW-UP  Important Information About Sugar         Signed, Angelena Form, PA-C  10/11/2022 8:12 AM    [place]

## 2022-10-09 ENCOUNTER — Ambulatory Visit (HOSPITAL_BASED_OUTPATIENT_CLINIC_OR_DEPARTMENT_OTHER): Payer: Medicare PPO

## 2022-10-09 ENCOUNTER — Ambulatory Visit: Payer: Medicare PPO | Attending: Cardiology | Admitting: Physician Assistant

## 2022-10-09 VITALS — BP 140/82 | HR 101 | Ht 65.0 in | Wt 189.0 lb

## 2022-10-09 DIAGNOSIS — I4819 Other persistent atrial fibrillation: Secondary | ICD-10-CM | POA: Insufficient documentation

## 2022-10-09 DIAGNOSIS — I5022 Chronic systolic (congestive) heart failure: Secondary | ICD-10-CM | POA: Diagnosis present

## 2022-10-09 DIAGNOSIS — Z952 Presence of prosthetic heart valve: Secondary | ICD-10-CM | POA: Insufficient documentation

## 2022-10-09 NOTE — Patient Instructions (Signed)
Medication Instructions:  Your physician recommends that you continue on your current medications as directed. Please refer to the Current Medication list given to you today.  *If you need a refill on your cardiac medications before your next appointment, please call your pharmacy*   Lab Work: NONE If you have labs (blood work) drawn today and your tests are completely normal, you will receive your results only by: MyChart Message (if you have MyChart) OR A paper copy in the mail If you have any lab test that is abnormal or we need to change your treatment, we will call you to review the results.   Testing/Procedures: NONE   Follow-Up: At [facility], you and your health needs are our priority.  As part of our continuing mission to provide you with exceptional heart care, we have created designated Provider Care Teams.  These Care Teams include your primary Cardiologist (physician) and Advanced Practice Providers (APPs -  Physician Assistants and Nurse Practitioners) who all work together to provide you with the care you need, when you need it.  We recommend signing up for the patient portal called "MyChart".  Sign up information is provided on this After Visit Summary.  MyChart is used to connect with patients for Virtual Visits (Telemedicine).  Patients are able to view lab/test results, encounter notes, upcoming appointments, etc.  Non-urgent messages can be sent to your provider as well.   To learn more about what you can do with MyChart, go to [email or web address].    Your next appointment:   KEEP SCHEDULED FOLLOW-UP   Important Information About Sugar       

## 2022-10-10 LAB — ECHOCARDIOGRAM COMPLETE
AR max vel: 0.73 cm2
AV Area VTI: 0.76 cm2
AV Area mean vel: 0.7 cm2
AV Mean grad: 9 mmHg
AV Peak grad: 14 mmHg
Ao pk vel: 1.87 m/s
Area-P 1/2: 3.92 cm2
S' Lateral: 3.25 cm

## 2022-10-16 ENCOUNTER — Ambulatory Visit: Payer: Medicare PPO

## 2022-10-16 ENCOUNTER — Ambulatory Visit: Payer: Medicare PPO | Attending: Cardiology | Admitting: Cardiology

## 2022-10-16 ENCOUNTER — Encounter: Payer: Self-pay | Admitting: Cardiology

## 2022-10-16 VITALS — BP 128/70 | HR 110 | Ht 65.0 in | Wt 187.8 lb

## 2022-10-16 DIAGNOSIS — Z01812 Encounter for preprocedural laboratory examination: Secondary | ICD-10-CM

## 2022-10-16 DIAGNOSIS — I428 Other cardiomyopathies: Secondary | ICD-10-CM | POA: Diagnosis not present

## 2022-10-16 DIAGNOSIS — I4819 Other persistent atrial fibrillation: Secondary | ICD-10-CM | POA: Diagnosis not present

## 2022-10-16 DIAGNOSIS — I10 Essential (primary) hypertension: Secondary | ICD-10-CM | POA: Diagnosis not present

## 2022-10-16 LAB — CUP PACEART REMOTE DEVICE CHECK
Date Time Interrogation Session: 20240108221931
Implantable Pulse Generator Implant Date: 20210322

## 2022-10-16 NOTE — H&P (View-Only) (Signed)
Electrophysiology Office Follow up Visit Note:    Date:  10/16/2022   ID:  Jenna [name], DOB 02-28-1941, MRN [id number]  PCP:  Chesley Noon, MD  [facility] Cardiologist:  [name], MD  [facility] Electrophysiologist:  [name], MD   CC: Phys Pacer CK  Interval History:    Jenna [name] is a 82 y.o. female who presents for a follow up visit.  She was previously followed by Dr. Rayann Heman.  She was last seen by Roderic Palau in the A-fib clinic October 02, 2022.  She is also been seen by [place].  It seems like she stopped all of her medications last summer/fall and in that setting had a recurrence of her arrhythmia that was symptomatic.  She had missed some doses of blood thinner and so she cannot be cardioverted until later this month.  She is prescribed Eliquis for stroke prophylaxis.  She presents to establish with me given the departure of Dr. Rayann Heman and to discuss treatment options for her atrial fibrillation.  She had a prior A-fib ablation in 2018 by Dr. Rayann Heman during which the veins and CTI were ablated.  Today, she is accompanied by her husband. She reports that she cannot drive anymore due to her eyesight difficulties. Her vital signs have improved since 3 weeks ago.  She has discontinued her medication abruptly. She recently restarted her Eliquis 5 mg twice a day, carvedilol (twice daily), Zaroxolyn '10mg'$ , and Lexapro 10 mg. She has some slight bilateral LE edema. She will receive a cardioversion 10/23/2022. She denies any chest pain, shortness of breath. No lightheadedness, headaches, syncope, orthopnea, or PND.   Past Medical History:  Diagnosis Date   Anxiety    CHF (congestive heart failure) (HCC)    Chronic lower back pain    Chronic systolic dysfunction of left ventricle    Depression    Fatigue    GERD (gastroesophageal reflux disease)    High cholesterol    Hypertension    Hypothyroidism    Melanoma of shoulder, left (HCC)    Mild aortic  stenosis    Nonischemic cardiomyopathy (HCC)    OSA on CPAP    reports compliance with CPAP   Osteoarthritis    Persistent atrial fibrillation with RVR ([place])    [name] 11/15/2016   PONV (postoperative nausea and vomiting)    RLS (restless legs syndrome)    S/P ablation of atrial flutter/ fib 01/10/17 01/11/2017   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 Angelena Form & Dr. Cyndia Bent   Type II diabetes mellitus ([place])    no medications    Past Surgical History:  Procedure Laterality Date   ATRIAL FIBRILLATION ABLATION N/A 01/10/2017   Procedure: Atrial Fibrillation Ablation;  Surgeon: Thompson Grayer, MD;  Location: [place] CV LAB;  Service: Cardiovascular;  Laterality: N/A;   CARDIOVERSION N/A 11/19/2016   Procedure: CARDIOVERSION;  Surgeon: Lelon Perla, MD;  Location[facility] ENDOSCOPY;  Service: Cardiovascular;  Laterality: N/A;   CARDIOVERSION N/A 12/11/2016   Procedure: CARDIOVERSION;  Surgeon: Dorothy Spark, MD;  Location: [place];  Service: Cardiovascular;  Laterality: N/A;   CARDIOVERSION N/A 12/18/2016   Procedure: CARDIOVERSION;  Surgeon: Skeet Latch, MD;  Location: [place];  Service: Cardiovascular;  Laterality: N/A;   CATARACT EXTRACTION W/ INTRAOCULAR LENS  IMPLANT, BILATERAL Bilateral    DILATION AND CURETTAGE OF UTERUS  implantable loop recorder placement  12/27/2019   Medtronic Reveal Linq model LNQ 11 (SN [id number]) implanted in office by Dr Rayann Heman   INTRAOPERATIVE TRANSTHORACIC ECHOCARDIOGRAM N/A 10/30/2021   Procedure: INTRAOPERATIVE TRANSTHORACIC ECHOCARDIOGRAM;  Surgeon: Burnell Blanks, MD;  Location: [place] CV LAB;  Service: Open Heart Surgery;  Laterality: N/A;   LEXISCAN MYOVIEW  05/13/2012   No ECG changes. EKG negative for ischemia. No significant ischemia demonstrated.   MELANOMA EXCISION Left ~ 09/1979   "back of my shoulder"   RIGHT/LEFT HEART CATH AND  CORONARY ANGIOGRAPHY N/A 09/17/2021   Procedure: RIGHT/LEFT HEART CATH AND CORONARY ANGIOGRAPHY;  Surgeon: Burnell Blanks, MD;  Location: [place] CV LAB;  Service: Cardiovascular;  Laterality: N/A;   TEE WITHOUT CARDIOVERSION N/A 11/19/2016   Procedure: TRANSESOPHAGEAL ECHOCARDIOGRAM (TEE);  Surgeon: Lelon Perla, MD;  Location: [facility] ENDOSCOPY;  Service: Cardiovascular;  Laterality: N/A;  need CV, but no anesthesia available   TEE WITHOUT CARDIOVERSION N/A 01/10/2017   Procedure: TRANSESOPHAGEAL ECHOCARDIOGRAM (TEE);  Surgeon: [name], MD;  Location: [facility] ENDOSCOPY;  Service: Cardiovascular;  Laterality: N/A;   TOTAL KNEE ARTHROPLASTY Right 07/28/2018   Procedure: RIGHT TOTAL KNEE ARTHROPLASTY;  Surgeon: Mcarthur Rossetti, MD;  Location: [place];  Service: Orthopedics;  Laterality: Right;   TRANSCATHETER AORTIC VALVE REPLACEMENT, TRANSFEMORAL N/A 10/30/2021   Procedure: TRANSCATHETER AORTIC VALVE REPLACEMENT, TRANSFEMORAL;  Surgeon: Burnell Blanks, MD;  Location: [place] CV LAB;  Service: Open Heart Surgery;  Laterality: N/A;   TRANSTHORACIC ECHOCARDIOGRAM  02/18/2012   EF >55%, mild aortic stenosis   VAGINAL HYSTERECTOMY  1985    Current Medications: Current Meds  Medication Sig   acetaminophen (TYLENOL) 500 MG tablet Take 1,000 mg by mouth every 6 (six) hours as needed for moderate pain.   ALPRAZolam (XANAX) 0.5 MG tablet Take 0.5 mg by mouth 2 (two) times daily.    amoxicillin (AMOXIL) 500 MG tablet Take 4 tablets by mouth 1 hour before dental procedures and cleanings   apixaban (ELIQUIS) 5 MG TABS tablet Take 1 tablet (5 mg total) by mouth 2 (two) times daily.   atorvastatin (LIPITOR) 20 MG tablet Take 1 tablet (20 mg total) by mouth every Monday, Wednesday, and Friday.   Calcium Carb-Cholecalciferol (CALCIUM 600 + D PO) Take 1 tablet by mouth 2 (two) times daily with a meal.   carvedilol (COREG) 6.25 MG tablet Take 2 tablets (12.5 mg total) by mouth 2  (two) times daily with a meal.   escitalopram (LEXAPRO) 10 MG tablet Take 10 mg by mouth daily.   hypromellose (SYSTANE OVERNIGHT THERAPY) 0.3 % GEL ophthalmic ointment Place 2 application into both eyes at bedtime.   levothyroxine (SYNTHROID) 75 MCG tablet Take 75 mcg by mouth daily before breakfast.   loratadine (CLARITIN) 10 MG tablet Take 10 mg by mouth daily.   Magnesium 500 MG TABS Take 500 mg by mouth every Monday, Wednesday, and Friday.   metolazone (ZAROXOLYN) 2.5 MG tablet Take 2.5 mg by mouth daily as needed for edema.   Multiple Vitamins-Minerals (PRESERVISION AREDS 2) CAPS Take 1 capsule by mouth 2 (two) times daily.   ONETOUCH ULTRA test strip    Polyethyl Glycol-Propyl Glycol (SYSTANE OP) Place 1-2 drops into both eyes 3 (three) times daily.   pyridOXINE (VITAMIN B-6) 100 MG tablet Take 100 mg by mouth daily with supper.   rOPINIRole (REQUIP) 0.5 MG tablet Take 0.5-1 mg by mouth See admin instructions. Take 0.5 mg at lunch, 0.5 mg  at supper, and 1 mg at bedtime   spironolactone (ALDACTONE) 25 MG tablet TAKE 1 TABLET (25 MG TOTAL) BY MOUTH DAILY.   zinc gluconate 50 MG tablet Take 50 mg by mouth daily.     Allergies:   Rosuvastatin and Lovastatin   Social History   Socioeconomic History   Marital status: Widowed    Spouse name: Not on file   Number of children: 2   Years of education: Not on file   Highest education level: Not on file  Occupational History   Occupation: [employer]  Tobacco Use   Smoking status: Never   Smokeless tobacco: Never  Vaping Use   Vaping Use: Never used  Substance and Sexual Activity   Alcohol use: No   Drug use: Never   Sexual activity: Never  Other Topics Concern   Not on file  Social History Narrative   Not on file   Social Determinants of Health   Financial Resource Strain: Not on file  Food Insecurity: Not on file  Transportation Needs: Not on file  Physical Activity: Not on file  Stress: Not on file   Social Connections: Not on file     Family History: The patient's family history includes Arrhythmia in her sister; Cancer in her paternal grandfather; Diabetes in her mother; Healthy in her brother and daughter; Heart failure in her father; Hyperlipidemia in her sister and son; Hypertension in her mother, sister, and son; Stroke in her paternal grandmother.  ROS:   Please see the history of present illness.    (+)fatigue (+) slight bilateral LE edema All other systems reviewed and are negative.  EKGs/Labs/Other Studies Reviewed:    The following studies were reviewed today:  EKG: EKG is personally reviewed. 10/16/22: EKG shows AF w/ RVR. V rate 110bpm.   Recent Labs: 10/26/2021: ALT 23; B Natriuretic Peptide 123.6 10/31/2021: BUN 21; Creatinine, Ser 0.89; Hemoglobin 11.8; Magnesium 1.9; Platelets 209; Potassium 3.7; Sodium 135   Recent Lipid Panel    Component Value Date/Time   CHOL 170 02/11/2017 0941   TRIG 93 02/11/2017 0941   HDL 82 02/11/2017 0941   CHOLHDL 2.1 02/11/2017 0941   VLDL 19 02/11/2017 0941   LDLCALC 69 02/11/2017 0941    Physical Exam:    VS:  BP 128/70   Pulse (!) 110   Ht '5\' 5"'$  (1.651 m)   Wt 187 lb 12.8 oz (85.2 kg)   SpO2 97%   BMI 31.25 kg/m     Wt Readings from Last 3 Encounters:  10/16/22 187 lb 12.8 oz (85.2 kg)  10/09/22 189 lb (85.7 kg)  10/02/22 188 lb 3.2 oz (85.4 kg)     GEN:  Well nourished, well developed in no acute distress. Elderly. CARDIAC: irregularly irregular, no murmurs, rubs, gallops. Device pocket well healed. RESPIRATORY:  Clear to auscultation without rales, wheezing or rhonchi  PSYCHIATRIC:  tearful       ASSESSMENT:    1. Persistent atrial fibrillation ([place])   2. Nonischemic cardiomyopathy ([place])   3. Primary hypertension   4. Pre-procedure lab exam    PLAN:    In order of problems listed above:   #Persistent atrial fibrillation Recurrent after she stopped all of her medications several months ago.   She is now back on her Eliquis.  Cardioversion is planned for later this month.  I discussed the DCCV in detail including the risks and she wishes to proceed.  #Chronic systolic heart failure NYHA class II.  EF 50% on echo earlier this month.  #Hypertension At goal today.  Recommend checking blood pressures 1-2 times per week at home and recording the values.  Recommend bringing these recordings to the primary care physician.    Follow-up: in  8 weeks with Butch Penny  Medication Adjustments/Labs and Tests Ordered: Current medicines are reviewed at length with the patient today.  Concerns regarding medicines are outlined above.  Orders Placed This Encounter  Procedures   Basic metabolic panel   CBC   EKG 12-Lead   No orders of the defined types were placed in this encounter.   I,Mitra Faeizi,acting as a Education administrator for [name], MD.,have documented all relevant documentation on the behalf of [name], MD,as directed by  [name], MD while in the presence of [name], MD.  I, [name], MD, have reviewed all documentation for this visit. The documentation on 10/16/22 for the exam, diagnosis, procedures, and orders are all accurate and complete.   Signed, Lars Mage, MD, [facility], [facility] 10/16/2022 10:25 PM    Electrophysiology [facility]

## 2022-10-16 NOTE — Patient Instructions (Signed)
Medication Instructions:  Your physician recommends that you continue on your current medications as directed. Please refer to the Current Medication list given to you today.  *If you need a refill on your cardiac medications before your next appointment, please call your pharmacy*   Lab Work: None ordered.  If you have labs (blood work) drawn today and your tests are completely normal, you will receive your results only by: [place] (if you have MyChart) OR A paper copy in the mail If you have any lab test that is abnormal or we need to change your treatment, we will call you to review the results.   Testing/Procedures: None ordered.    Follow-Up: At [facility], you and your health needs are our priority.  As part of our continuing mission to provide you with exceptional heart care, we have created designated Provider Care Teams.  These Care Teams include your primary Cardiologist (physician) and Advanced Practice Providers (APPs -  Physician Assistants and Nurse Practitioners) who all work together to provide you with the care you need, when you need it.  We recommend signing up for the patient portal called "MyChart".  Sign up information is provided on this After Visit Summary.  MyChart is used to connect with patients for Virtual Visits (Telemedicine).  Patients are able to view lab/test results, encounter notes, upcoming appointments, etc.  Non-urgent messages can be sent to your provider as well.   To learn more about what you can do with MyChart, go to [email or web address].    Your next appointment:   Afib Clinic in 8 weeks per Dr Quentin Ore  Other Instructions Cardioversion scheduled for: Wednesday, January 17th               - Arrive at the [facility] and go to admitting at 930am               - Do not eat or drink anything after midnight the night prior to your procedure.               - Take all your morning medication (except diabetic  medications) with a sip of water prior to arrival.   - You will not be able to drive home after your procedure.                - Do NOT miss any doses of your blood thinner - if you should miss a dose please notify our office immediately.               - If you feel as if you go back into normal rhythm prior to scheduled cardioversion, please notify our office immediately.  If your procedure is canceled in the cardioversion suite you will be charged a cancellation fee.

## 2022-10-16 NOTE — Progress Notes (Signed)
Electrophysiology Office Follow up Visit Note:    Date:  10/16/2022   ID:  ASAL TEAS, DOB 1941-06-19, MRN [id number]  PCP:  Chesley Noon, MD  [facility] Cardiologist:  [name], MD  [facility] Electrophysiologist:  [name], MD   CC: Phys Pacer CK  Interval History:    Jenna [name] is a 82 y.o. female who presents for a follow up visit.  She was previously followed by Dr. Rayann Heman.  She was last seen by Roderic Palau in the A-fib clinic October 02, 2022.  She is also been seen by [place].  It seems like she stopped all of her medications last summer/fall and in that setting had a recurrence of her arrhythmia that was symptomatic.  She had missed some doses of blood thinner and so she cannot be cardioverted until later this month.  She is prescribed Eliquis for stroke prophylaxis.  She presents to establish with me given the departure of Dr. Rayann Heman and to discuss treatment options for her atrial fibrillation.  She had a prior A-fib ablation in 2018 by Dr. Rayann Heman during which the veins and CTI were ablated.  Today, she is accompanied by her husband. She reports that she cannot drive anymore due to her eyesight difficulties. Her vital signs have improved since 3 weeks ago.  She has discontinued her medication abruptly. She recently restarted her Eliquis 5 mg twice a day, carvedilol (twice daily), Zaroxolyn '10mg'$ , and Lexapro 10 mg. She has some slight bilateral LE edema. She will receive a cardioversion 10/23/2022. She denies any chest pain, shortness of breath. No lightheadedness, headaches, syncope, orthopnea, or PND.   Past Medical History:  Diagnosis Date   Anxiety    CHF (congestive heart failure) (HCC)    Chronic lower back pain    Chronic systolic dysfunction of left ventricle    Depression    Fatigue    GERD (gastroesophageal reflux disease)    High cholesterol    Hypertension    Hypothyroidism    Melanoma of shoulder, left (HCC)    Mild aortic  stenosis    Nonischemic cardiomyopathy (HCC)    OSA on CPAP    reports compliance with CPAP   Osteoarthritis    Persistent atrial fibrillation with RVR ([place])    [name] 11/15/2016   PONV (postoperative nausea and vomiting)    RLS (restless legs syndrome)    S/P ablation of atrial flutter/ fib 01/10/17 01/11/2017   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 Angelena Form & Dr. Cyndia Bent   Type II diabetes mellitus ([place])    no medications    Past Surgical History:  Procedure Laterality Date   ATRIAL FIBRILLATION ABLATION N/A 01/10/2017   Procedure: Atrial Fibrillation Ablation;  Surgeon: Thompson Grayer, MD;  Location: [place] CV LAB;  Service: Cardiovascular;  Laterality: N/A;   CARDIOVERSION N/A 11/19/2016   Procedure: CARDIOVERSION;  Surgeon: Lelon Perla, MD;  Location: [facility] ENDOSCOPY;  Service: Cardiovascular;  Laterality: N/A;   CARDIOVERSION N/A 12/11/2016   Procedure: CARDIOVERSION;  Surgeon: Dorothy Spark, MD;  Location: [place];  Service: Cardiovascular;  Laterality: N/A;   CARDIOVERSION N/A 12/18/2016   Procedure: CARDIOVERSION;  Surgeon: Skeet Latch, MD;  Location: [place];  Service: Cardiovascular;  Laterality: N/A;   CATARACT EXTRACTION W/ INTRAOCULAR LENS  IMPLANT, BILATERAL Bilateral    DILATION AND CURETTAGE OF UTERUS  implantable loop recorder placement  12/27/2019   Medtronic Reveal Linq model LNQ 11 (SN [id number]) implanted in office by Dr Rayann Heman   INTRAOPERATIVE TRANSTHORACIC ECHOCARDIOGRAM N/A 10/30/2021   Procedure: INTRAOPERATIVE TRANSTHORACIC ECHOCARDIOGRAM;  Surgeon: Burnell Blanks, MD;  Location: [place] CV LAB;  Service: Open Heart Surgery;  Laterality: N/A;   LEXISCAN MYOVIEW  05/13/2012   No ECG changes. EKG negative for ischemia. No significant ischemia demonstrated.   MELANOMA EXCISION Left ~ 09/1979   "back of my shoulder"   RIGHT/LEFT HEART CATH AND  CORONARY ANGIOGRAPHY N/A 09/17/2021   Procedure: RIGHT/LEFT HEART CATH AND CORONARY ANGIOGRAPHY;  Surgeon: Burnell Blanks, MD;  Location: [place] CV LAB;  Service: Cardiovascular;  Laterality: N/A;   TEE WITHOUT CARDIOVERSION N/A 11/19/2016   Procedure: TRANSESOPHAGEAL ECHOCARDIOGRAM (TEE);  Surgeon: Lelon Perla, MD;  Location: [facility] ENDOSCOPY;  Service: Cardiovascular;  Laterality: N/A;  need CV, but no anesthesia available   TEE WITHOUT CARDIOVERSION N/A 01/10/2017   Procedure: TRANSESOPHAGEAL ECHOCARDIOGRAM (TEE);  Surgeon: [name], MD;  Location: [facility] ENDOSCOPY;  Service: Cardiovascular;  Laterality: N/A;   TOTAL KNEE ARTHROPLASTY Right 07/28/2018   Procedure: RIGHT TOTAL KNEE ARTHROPLASTY;  Surgeon: Mcarthur Rossetti, MD;  Location: [place];  Service: Orthopedics;  Laterality: Right;   TRANSCATHETER AORTIC VALVE REPLACEMENT, TRANSFEMORAL N/A 10/30/2021   Procedure: TRANSCATHETER AORTIC VALVE REPLACEMENT, TRANSFEMORAL;  Surgeon: Burnell Blanks, MD;  Location: [place] CV LAB;  Service: Open Heart Surgery;  Laterality: N/A;   TRANSTHORACIC ECHOCARDIOGRAM  02/18/2012   EF >55%, mild aortic stenosis   VAGINAL HYSTERECTOMY  1985    Current Medications: Current Meds  Medication Sig   acetaminophen (TYLENOL) 500 MG tablet Take 1,000 mg by mouth every 6 (six) hours as needed for moderate pain.   ALPRAZolam (XANAX) 0.5 MG tablet Take 0.5 mg by mouth 2 (two) times daily.    amoxicillin (AMOXIL) 500 MG tablet Take 4 tablets by mouth 1 hour before dental procedures and cleanings   apixaban (ELIQUIS) 5 MG TABS tablet Take 1 tablet (5 mg total) by mouth 2 (two) times daily.   atorvastatin (LIPITOR) 20 MG tablet Take 1 tablet (20 mg total) by mouth every Monday, Wednesday, and Friday.   Calcium Carb-Cholecalciferol (CALCIUM 600 + D PO) Take 1 tablet by mouth 2 (two) times daily with a meal.   carvedilol (COREG) 6.25 MG tablet Take 2 tablets (12.5 mg total) by mouth 2  (two) times daily with a meal.   escitalopram (LEXAPRO) 10 MG tablet Take 10 mg by mouth daily.   hypromellose (SYSTANE OVERNIGHT THERAPY) 0.3 % GEL ophthalmic ointment Place 2 application into both eyes at bedtime.   levothyroxine (SYNTHROID) 75 MCG tablet Take 75 mcg by mouth daily before breakfast.   loratadine (CLARITIN) 10 MG tablet Take 10 mg by mouth daily.   Magnesium 500 MG TABS Take 500 mg by mouth every Monday, Wednesday, and Friday.   metolazone (ZAROXOLYN) 2.5 MG tablet Take 2.5 mg by mouth daily as needed for edema.   Multiple Vitamins-Minerals (PRESERVISION AREDS 2) CAPS Take 1 capsule by mouth 2 (two) times daily.   ONETOUCH ULTRA test strip    Polyethyl Glycol-Propyl Glycol (SYSTANE OP) Place 1-2 drops into both eyes 3 (three) times daily.   pyridOXINE (VITAMIN B-6) 100 MG tablet Take 100 mg by mouth daily with supper.   rOPINIRole (REQUIP) 0.5 MG tablet Take 0.5-1 mg by mouth See admin instructions. Take 0.5 mg at lunch, 0.5 mg  at supper, and 1 mg at bedtime   spironolactone (ALDACTONE) 25 MG tablet TAKE 1 TABLET (25 MG TOTAL) BY MOUTH DAILY.   zinc gluconate 50 MG tablet Take 50 mg by mouth daily.     Allergies:   Rosuvastatin and Lovastatin   Social History   Socioeconomic History   Marital status: Widowed    Spouse name: Not on file   Number of children: 2   Years of education: Not on file   Highest education level: Not on file  Occupational History   Occupation: [place]  Tobacco Use   Smoking status: Never   Smokeless tobacco: Never  Vaping Use   Vaping Use: Never used  Substance and Sexual Activity   Alcohol use: No   Drug use: Never   Sexual activity: Never  Other Topics Concern   Not on file  Social History Narrative   Not on file   Social Determinants of Health   Financial Resource Strain: Not on file  Food Insecurity: Not on file  Transportation Needs: Not on file  Physical Activity: Not on file  Stress: Not on file   Social Connections: Not on file     Family History: The patient's family history includes Arrhythmia in her sister; Cancer in her paternal grandfather; Diabetes in her mother; Healthy in her brother and daughter; Heart failure in her father; Hyperlipidemia in her sister and son; Hypertension in her mother, sister, and son; Stroke in her paternal grandmother.  ROS:   Please see the history of present illness.    (+)fatigue (+) slight bilateral LE edema All other systems reviewed and are negative.  EKGs/Labs/Other Studies Reviewed:    The following studies were reviewed today:  EKG: EKG is personally reviewed. 10/16/22: EKG shows AF w/ RVR. V rate 110bpm.   Recent Labs: 10/26/2021: ALT 23; B Natriuretic Peptide 123.6 10/31/2021: BUN 21; Creatinine, Ser 0.89; Hemoglobin 11.8; Magnesium 1.9; Platelets 209; Potassium 3.7; Sodium 135   Recent Lipid Panel    Component Value Date/Time   CHOL 170 02/11/2017 0941   TRIG 93 02/11/2017 0941   HDL 82 02/11/2017 0941   CHOLHDL 2.1 02/11/2017 0941   VLDL 19 02/11/2017 0941   LDLCALC 69 02/11/2017 0941    Physical Exam:    VS:  BP 128/70   Pulse (!) 110   Ht '5\' 5"'$  (1.651 m)   Wt 187 lb 12.8 oz (85.2 kg)   SpO2 97%   BMI 31.25 kg/m     Wt Readings from Last 3 Encounters:  10/16/22 187 lb 12.8 oz (85.2 kg)  10/09/22 189 lb (85.7 kg)  10/02/22 188 lb 3.2 oz (85.4 kg)     GEN:  Well nourished, well developed in no acute distress. Elderly. CARDIAC: irregularly irregular, no murmurs, rubs, gallops. Device pocket well healed. RESPIRATORY:  Clear to auscultation without rales, wheezing or rhonchi  PSYCHIATRIC:  tearful       ASSESSMENT:    1. Persistent atrial fibrillation ([place])   2. Nonischemic cardiomyopathy ([place])   3. Primary hypertension   4. Pre-procedure lab exam    PLAN:    In order of problems listed above:   #Persistent atrial fibrillation Recurrent after she stopped all of her medications several months ago.   She is now back on her Eliquis.  Cardioversion is planned for later this month.  I discussed the DCCV in detail including the risks and she wishes to proceed.  #Chronic systolic heart failure NYHA class II.  EF 50% on echo earlier this month.  #Hypertension At goal today.  Recommend checking blood pressures 1-2 times per week at home and recording the values.  Recommend bringing these recordings to the primary care physician.    Follow-up: in  8 weeks with Butch Penny  Medication Adjustments/Labs and Tests Ordered: Current medicines are reviewed at length with the patient today.  Concerns regarding medicines are outlined above.  Orders Placed This Encounter  Procedures   Basic metabolic panel   CBC   EKG 12-Lead   No orders of the defined types were placed in this encounter.   I,Mitra Faeizi,acting as a Education administrator for [name], MD.,have documented all relevant documentation on the behalf of [name], MD,as directed by  [name], MD while in the presence of [name], MD.  I, [name], MD, have reviewed all documentation for this visit. The documentation on 10/16/22 for the exam, diagnosis, procedures, and orders are all accurate and complete.   Signed, Lars Mage, MD, [facility], [facility] 10/16/2022 10:25 PM    Electrophysiology [facility]

## 2022-10-16 NOTE — Progress Notes (Deleted)
Electrophysiology Office Follow up Visit Note:    Date:  10/16/2022   ID:  Jenna [name], DOB 03/07/1941, MRN [id number]  PCP:  Chesley Noon, MD  [facility] Cardiologist:  [name], MD  [facility] Electrophysiologist:  None    Interval History:    Jenna [name] is a 82 y.o. female who presents for a follow up visit.  She was previously followed by Dr. Rayann Heman.  She was last seen by Roderic Palau in the A-fib clinic October 02, 2022.  She is also been seen by [place].  It seems like she stopped all of her medications last summer/fall and in that setting had a recurrence of her arrhythmia that was symptomatic.  She had missed some doses of blood thinner and so she cannot be cardioverted until later this month.  She is prescribed Eliquis for stroke prophylaxis.  She presents to establish with me given the departure of Dr. Rayann Heman and to discuss treatment options for her atrial fibrillation.  She had a prior A-fib ablation in 2018 by Dr. Rayann Heman during which the veins and CTI were ablated.      Past Medical History:  Diagnosis Date   Anxiety    CHF (congestive heart failure) (HCC)    Chronic lower back pain    Chronic systolic dysfunction of left ventricle    Depression    Fatigue    GERD (gastroesophageal reflux disease)    High cholesterol    Hypertension    Hypothyroidism    Melanoma of shoulder, left (HCC)    Mild aortic stenosis    Nonischemic cardiomyopathy (HCC)    OSA on CPAP    reports compliance with CPAP   Osteoarthritis    Persistent atrial fibrillation with RVR ([place])    [name] 11/15/2016   PONV (postoperative nausea and vomiting)    RLS (restless legs syndrome)    S/P ablation of atrial flutter/ fib 01/10/17 01/11/2017   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 Angelena Form & Dr. Cyndia Bent   Type II diabetes mellitus ([place])    no medications    Past Surgical History:   Procedure Laterality Date   ATRIAL FIBRILLATION ABLATION N/A 01/10/2017   Procedure: Atrial Fibrillation Ablation;  Surgeon: Thompson Grayer, MD;  Location: [place] CV LAB;  Service: Cardiovascular;  Laterality: N/A;   CARDIOVERSION N/A 11/19/2016   Procedure: CARDIOVERSION;  Surgeon: Lelon Perla, MD;  Location: [facility] ENDOSCOPY;  Service: Cardiovascular;  Laterality: N/A;   CARDIOVERSION N/A 12/11/2016   Procedure: CARDIOVERSION;  Surgeon: Dorothy Spark, MD;  Location: [place];  Service: Cardiovascular;  Laterality: N/A;   CARDIOVERSION N/A 12/18/2016   Procedure: CARDIOVERSION;  Surgeon: Skeet Latch, MD;  Location: [facility] ENDOSCOPY;  Service: Cardiovascular;  Laterality: N/A;   CATARACT EXTRACTION W/ INTRAOCULAR LENS  IMPLANT, BILATERAL Bilateral    DILATION AND CURETTAGE OF UTERUS     implantable loop recorder placement  12/27/2019   Medtronic Reveal Linq model WSF 68 (SN [id number]) implanted in office by Dr Rayann Heman   INTRAOPERATIVE TRANSTHORACIC ECHOCARDIOGRAM N/A 10/30/2021   Procedure: INTRAOPERATIVE TRANSTHORACIC ECHOCARDIOGRAM;  Surgeon: Burnell Blanks, MD;  Location: [place] CV LAB;  Service: Open Heart Surgery;  Laterality: N/A;   LEXISCAN MYOVIEW  05/13/2012   No ECG changes. EKG negative for ischemia. No significant ischemia demonstrated.   MELANOMA EXCISION Left ~ 09/1979   "back of my  shoulder"   RIGHT/LEFT HEART CATH AND CORONARY ANGIOGRAPHY N/A 09/17/2021   Procedure: RIGHT/LEFT HEART CATH AND CORONARY ANGIOGRAPHY;  Surgeon: Burnell Blanks, MD;  Location: [place] CV LAB;  Service: Cardiovascular;  Laterality: N/A;   TEE WITHOUT CARDIOVERSION N/A 11/19/2016   Procedure: TRANSESOPHAGEAL ECHOCARDIOGRAM (TEE);  Surgeon: Lelon Perla, MD;  [facility] ENDOSCOPY;  Service: Cardiovascular;  Laterality: N/A;  need CV, but no anesthesia available   TEE WITHOUT CARDIOVERSION N/A 01/10/2017   Procedure: TRANSESOPHAGEAL ECHOCARDIOGRAM (TEE);  Surgeon:  [name], MD;  Location: [facility] ENDOSCOPY;  Service: Cardiovascular;  Laterality: N/A;   TOTAL KNEE ARTHROPLASTY Right 07/28/2018   Procedure: RIGHT TOTAL KNEE ARTHROPLASTY;  Surgeon: Mcarthur Rossetti, MD;  Location: [place];  Service: Orthopedics;  Laterality: Right;   TRANSCATHETER AORTIC VALVE REPLACEMENT, TRANSFEMORAL N/A 10/30/2021   Procedure: TRANSCATHETER AORTIC VALVE REPLACEMENT, TRANSFEMORAL;  Surgeon: Burnell Blanks, MD;  Location: [place] CV LAB;  Service: Open Heart Surgery;  Laterality: N/A;   TRANSTHORACIC ECHOCARDIOGRAM  02/18/2012   EF >55%, mild aortic stenosis   VAGINAL HYSTERECTOMY  1985    Current Medications: No outpatient medications have been marked as taking for the 10/16/22 encounter (Appointment) with [name], MD.     Allergies:   Rosuvastatin and Lovastatin   Social History   Socioeconomic History   Marital status: Widowed    Spouse name: Not on file   Number of children: 2   Years of education: Not on file   Highest education level: Not on file  Occupational History   Occupation: [place]  Tobacco Use   Smoking status: Never   Smokeless tobacco: Never  Vaping Use   Vaping Use: Never used  Substance and Sexual Activity   Alcohol use: No   Drug use: Never   Sexual activity: Never  Other Topics Concern   Not on file  Social History Narrative   Not on file   Social Determinants of Health   Financial Resource Strain: Not on file  Food Insecurity: Not on file  Transportation Needs: Not on file  Physical Activity: Not on file  Stress: Not on file  Social Connections: Not on file     Family History: The patient's family history includes Arrhythmia in her sister; Cancer in her paternal grandfather; Diabetes in her mother; Healthy in her brother and daughter; Heart failure in her father; Hyperlipidemia in her sister and son; Hypertension in her mother, sister, and son; Stroke in her paternal  grandmother.  ROS:   Please see the history of present illness.    All other systems reviewed and are negative.  EKGs/Labs/Other Studies Reviewed:    The following studies were reviewed today:     Recent Labs: 10/26/2021: ALT 23; B Natriuretic Peptide 123.6 10/31/2021: BUN 21; Creatinine, Ser 0.89; Hemoglobin 11.8; Magnesium 1.9; Platelets 209; Potassium 3.7; Sodium 135  Recent Lipid Panel    Component Value Date/Time   CHOL 170 02/11/2017 0941   TRIG 93 02/11/2017 0941   HDL 82 02/11/2017 0941   CHOLHDL 2.1 02/11/2017 0941   VLDL 19 02/11/2017 0941   LDLCALC 69 02/11/2017 0941    Physical Exam:    VS:  There were no vitals taken for this visit.    Wt Readings from Last 3 Encounters:  10/09/22 189 lb (85.7 kg)  10/02/22 188 lb 3.2 oz (85.4 kg)  09/24/22 189 lb 9.6 oz (86 kg)     GEN: *** Well nourished, well developed  in no acute distress HEENT: Normal NECK: No JVD; No carotid bruits LYMPHATICS: No lymphadenopathy CARDIAC: ***RRR, no murmurs, rubs, gallops RESPIRATORY:  Clear to auscultation without rales, wheezing or rhonchi  ABDOMEN: Soft, non-tender, non-distended MUSCULOSKELETAL:  No edema; No deformity  SKIN: Warm and dry NEUROLOGIC:  Alert and oriented x 3 PSYCHIATRIC:  Normal affect        ASSESSMENT:    1. Persistent atrial fibrillation ([place])   2. Nonischemic cardiomyopathy ([place])   3. Primary hypertension    PLAN:    In order of problems listed above:   #Persistent atrial fibrillation Recurrent after she stopped all of her medications several months ago.  She is now back on her Eliquis.  Cardioversion is planned for later this month.   #Chronic systolic heart failure NYHA class II.  EF 50% on echo earlier this month.  #Hypertension *** goal today.  Recommend checking blood pressures 1-2 times per week at home and recording the values.  Recommend bringing these recordings to the primary care physician.      Total time spent with  patient today *** minutes. This includes reviewing records, evaluating the patient and coordinating care.   Medication Adjustments/Labs and Tests Ordered: Current medicines are reviewed at length with the patient today.  Concerns regarding medicines are outlined above.  No orders of the defined types were placed in this encounter.  No orders of the defined types were placed in this encounter.    Signed, Lars Mage, MD, [facility], [facility] 10/16/2022 9:12 AM    Electrophysiology [facility]

## 2022-10-17 LAB — CBC
Hematocrit: 42.4 % (ref 34.0–46.6)
Hemoglobin: 13.8 g/dL (ref 11.1–15.9)
MCH: 28.6 pg (ref 26.6–33.0)
MCHC: 32.5 g/dL (ref 31.5–35.7)
MCV: 88 fL (ref 79–97)
Platelets: 252 10*3/uL (ref 150–450)
RBC: 4.82 x10E6/uL (ref 3.77–5.28)
RDW: 11.7 % (ref 11.7–15.4)
WBC: 7.7 10*3/uL (ref 3.4–10.8)

## 2022-10-17 LAB — BASIC METABOLIC PANEL
BUN/Creatinine Ratio: 24 (ref 12–28)
BUN: 24 mg/dL (ref 8–27)
CO2: 26 mmol/L (ref 20–29)
Calcium: 9.9 mg/dL (ref 8.7–10.3)
Chloride: 100 mmol/L (ref 96–106)
Creatinine, Ser: 0.98 mg/dL (ref 0.57–1.00)
Glucose: 147 mg/dL — ABNORMAL HIGH (ref 70–99)
Potassium: 4.6 mmol/L (ref 3.5–5.2)
Sodium: 140 mmol/L (ref 134–144)
eGFR: 58 mL/min/{1.73_m2} — ABNORMAL LOW (ref >59)

## 2022-10-18 ENCOUNTER — Emergency Department (HOSPITAL_COMMUNITY): Payer: Medicare PPO

## 2022-10-18 ENCOUNTER — Other Ambulatory Visit: Payer: Self-pay

## 2022-10-18 ENCOUNTER — Emergency Department (HOSPITAL_COMMUNITY)
Admission: EM | Admit: 2022-10-18 | Discharge: 2022-10-22 | Disposition: A | Discharge status: Home / Self Care | Payer: Medicare PPO | Attending: Emergency Medicine | Admitting: Emergency Medicine

## 2022-10-18 DIAGNOSIS — F32A Depression, unspecified: Secondary | ICD-10-CM | POA: Insufficient documentation

## 2022-10-18 DIAGNOSIS — M25562 Pain in left knee: Secondary | ICD-10-CM

## 2022-10-18 DIAGNOSIS — Z96651 Presence of right artificial knee joint: Secondary | ICD-10-CM | POA: Diagnosis not present

## 2022-10-18 DIAGNOSIS — Z7901 Long term (current) use of anticoagulants: Secondary | ICD-10-CM

## 2022-10-18 DIAGNOSIS — I4891 Unspecified atrial fibrillation: Secondary | ICD-10-CM | POA: Insufficient documentation

## 2022-10-18 DIAGNOSIS — I10 Essential (primary) hypertension: Secondary | ICD-10-CM

## 2022-10-18 DIAGNOSIS — R0602 Shortness of breath: Secondary | ICD-10-CM | POA: Diagnosis not present

## 2022-10-18 DIAGNOSIS — R531 Weakness: Secondary | ICD-10-CM | POA: Insufficient documentation

## 2022-10-18 DIAGNOSIS — R600 Localized edema: Secondary | ICD-10-CM | POA: Insufficient documentation

## 2022-10-18 DIAGNOSIS — I4819 Other persistent atrial fibrillation: Secondary | ICD-10-CM

## 2022-10-18 LAB — CBC WITH DIFFERENTIAL/PLATELET
Abs Immature Granulocytes: 0.04 10*3/uL (ref 0.00–0.07)
Basophils Absolute: 0 10*3/uL (ref 0.0–0.1)
Basophils Relative: 0 %
Eosinophils Absolute: 0 10*3/uL (ref 0.0–0.5)
Eosinophils Relative: 0 %
HCT: 38.4 % (ref 36.0–46.0)
Hemoglobin: 12.7 g/dL (ref 12.0–15.0)
Immature Granulocytes: 0 %
Lymphocytes Relative: 13 %
Lymphs Abs: 1.4 10*3/uL (ref 0.7–4.0)
MCH: 29 pg (ref 26.0–34.0)
MCHC: 33.1 g/dL (ref 30.0–36.0)
MCV: 87.7 fL (ref 80.0–100.0)
Monocytes Absolute: 1.6 10*3/uL — ABNORMAL HIGH (ref 0.1–1.0)
Monocytes Relative: 15 %
Neutro Abs: 7.4 10*3/uL (ref 1.7–7.7)
Neutrophils Relative %: 72 %
Platelets: 201 10*3/uL (ref 150–400)
RBC: 4.38 MIL/uL (ref 3.87–5.11)
RDW: 12.7 % (ref 11.5–15.5)
WBC: 10.4 10*3/uL (ref 4.0–10.5)
nRBC: 0 % (ref 0.0–0.2)

## 2022-10-18 LAB — PROTIME-INR
INR: 1.9 — ABNORMAL HIGH (ref 0.8–1.2)
Prothrombin Time: 22 seconds — ABNORMAL HIGH (ref 11.4–15.2)

## 2022-10-18 MED ORDER — LACTATED RINGERS IV BOLUS
500.0000 mL | Freq: Once | INTRAVENOUS | Status: AC
  Administered 2022-10-18: 500 mL via INTRAVENOUS

## 2022-10-18 NOTE — ED Provider Notes (Incomplete)
[facility] EMERGENCY DEPARTMENT Provider Note   CSN: [id number] Arrival date & time: 10/18/22  2222     History {Add pertinent medical, surgical, social history, OB history to HPI:1} Chief Complaint  Patient presents with   Failure To Thrive    BIB EMS from home for failure to thrive, per family pt has been bedbound for the past two days due to left knee pain, per pt oral intake has been decreased, has a-fib controlled and is scheduled for cardioversion next Wednesday, VSS, Alert and oriented x4    Jenna [name] is a 82 y.o. female who presents with her daughter Kenney Houseman at the bedside with concern for global weakness and severe left knee pain rendering the patient unable to stand or ambulate independently.  Does live on her own, has family in the area has been checking on her and states the patient has been able to get off the couch for a few days.  They have helped her to the restroom and there, she wears briefs when they are not present but has been unable to ambulate to the kitchen to fix food unless family is present, has not been eating.  She endorses shortness of breath since October, patient does have persistent atrial fibrillation anticoagulated on Eliquis and scheduled for cardioversion in 5 days.  Additionally scheduled with Dr. Ninfa Linden, Ortho for cortisone injection in the left knee in 3 days.  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  According to the patient's daughter she has been depressed since her husband passed, has had prolonged delay in her response to verbal questioning times approximately 9 months as her depression is worsened, following with primary care doctor to manage the symptoms.  EMS called this evening primarily for patient's weakness.   HPI     Home Medications Prior to Admission medications   Medication Sig Start Date End Date Taking? Authorizing Provider  acetaminophen (TYLENOL) 500 MG tablet Take  1,000 mg by mouth every 6 (six) hours as needed for moderate pain.    [provider]  ALPRAZolam Duanne Moron) 0.5 MG tablet Take 0.5 mg by mouth 2 (two) times daily.  07/05/16   [provider]  amoxicillin (AMOXIL) 500 MG tablet Take 4 tablets by mouth 1 hour before dental procedures and cleanings 11/09/21   Eileen Stanford, PA-C  apixaban (ELIQUIS) 5 MG TABS tablet Take 1 tablet (5 mg total) by mouth 2 (two) times daily. 09/24/22   Fenton, Clint R, PA  atorvastatin (LIPITOR) 20 MG tablet Take 1 tablet (20 mg total) by mouth every Monday, Wednesday, and Friday. 10/24/21   Hilty, Nadean Corwin, MD  Calcium Carb-Cholecalciferol (CALCIUM 600 + D PO) Take 1 tablet by mouth 2 (two) times daily with a meal.    [provider]  carvedilol (COREG) 6.25 MG tablet Take 2 tablets (12.5 mg total) by mouth 2 (two) times daily with a meal. 10/02/22   Sherran Needs, NP  escitalopram (LEXAPRO) 10 MG tablet Take 10 mg by mouth daily. 08/06/22   [provider]  hypromellose (SYSTANE OVERNIGHT THERAPY) 0.3 % GEL ophthalmic ointment Place 2 application into both eyes at bedtime.    [provider]  levothyroxine (SYNTHROID) 75 MCG tablet Take 75 mcg by mouth daily before breakfast. 09/04/22   [provider]  loratadine (CLARITIN) 10 MG tablet Take 10 mg by mouth daily.    [provider]  Magnesium 500 MG TABS Take  500 mg by mouth every Monday, Wednesday, and Friday.    [provider]  metolazone (ZAROXOLYN) 2.5 MG tablet Take 2.5 mg by mouth daily as needed for edema. 07/26/21   [provider]  Multiple Vitamins-Minerals (PRESERVISION AREDS 2) CAPS Take 1 capsule by mouth 2 (two) times daily.    [provider]  [facility] ULTRA test strip  02/08/20   [provider]  Polyethyl Glycol-Propyl Glycol (SYSTANE OP) Place 1-2 drops into both eyes 3 (three) times daily.    [provider]  pyridOXINE (VITAMIN B-6) 100 MG  tablet Take 100 mg by mouth daily with supper.    [provider]  rOPINIRole (REQUIP) 0.5 MG tablet Take 0.5-1 mg by mouth See admin instructions. Take 0.5 mg at lunch, 0.5 mg at supper, and 1 mg at bedtime    [provider]  spironolactone (ALDACTONE) 25 MG tablet TAKE 1 TABLET (25 MG TOTAL) BY MOUTH DAILY. 03/22/22   Hilty, Nadean Corwin, MD  zinc gluconate 50 MG tablet Take 50 mg by mouth daily.    [provider]      Allergies    Rosuvastatin and Lovastatin    Review of Systems   Review of Systems  Constitutional:  Positive for appetite change and fatigue.  Respiratory:  Positive for shortness of breath.   Cardiovascular:  Negative for chest pain.  Musculoskeletal:        Left knee pain  Neurological:  Positive for weakness.  Psychiatric/Behavioral:  Positive for dysphoric mood.     Physical Exam Updated Vital Signs BP 132/70 (BP Location: Right Arm)   Pulse 100   Temp 98.3 F (36.8 C) (Oral)   Resp 16   Ht '5\' 5"'$  (1.651 m)   Wt 84.8 kg   SpO2 97%   BMI 31.12 kg/m  Physical Exam Vitals and nursing note reviewed.  Constitutional:      Appearance: She is obese. She is not ill-appearing or toxic-appearing.  HENT:     Head: Normocephalic and atraumatic.     Nose: Nose normal.     Mouth/Throat:     Mouth: Mucous membranes are dry.     Pharynx: Oropharynx is clear. Uvula midline. No oropharyngeal exudate or posterior oropharyngeal erythema.  Eyes:     General:        Right eye: No discharge.        Left eye: No discharge.     Extraocular Movements: Extraocular movements intact.     Conjunctiva/sclera: Conjunctivae normal.     Pupils: Pupils are equal, round, and reactive to light.  Cardiovascular:     Rate and Rhythm: Normal rate. Rhythm irregularly irregular.     Pulses: Normal pulses.     Heart sounds: Normal heart sounds. No murmur heard. Pulmonary:     Effort: Pulmonary effort is normal. No tachypnea, bradypnea, accessory muscle usage,  prolonged expiration or respiratory distress.     Breath sounds: Normal breath sounds. No wheezing or rales.  Chest:     Chest wall: No mass, lacerations, deformity, swelling, tenderness, crepitus or edema.  Abdominal:     General: Bowel sounds are normal. There is no distension.     Palpations: Abdomen is soft.     Tenderness: There is no abdominal tenderness. There is no guarding or rebound.  Musculoskeletal:        General: No deformity.     Cervical back: Neck supple.     Right knee: Normal.  Left knee: Bony tenderness present. Decreased range of motion.     Right lower leg: 1+ Edema present.     Left lower leg: 1+ Edema present.     Comments: Decreased ROM of the left  knee secondary to pain  Skin:    General: Skin is warm and dry.     Capillary Refill: Capillary refill takes less than 2 seconds.  Neurological:     General: No focal deficit present.     Mental Status: She is alert and oriented to person, place, and time. Mental status is at baseline.     GCS: GCS eye subscore is 4. GCS verbal subscore is 5. GCS motor subscore is 6.  Psychiatric:        Mood and Affect: Mood is depressed. Affect is flat and tearful.        Speech: Speech is delayed.        Behavior: Behavior is withdrawn.     Comments: Does not appear to be responding to internal stimuli.     ED Results / Procedures / Treatments   Labs (all labs ordered are listed, but only abnormal results are displayed) Labs Reviewed - No data to display  EKG None  Radiology No results found.  Procedures Procedures  {Document cardiac monitor, telemetry assessment procedure when appropriate:1}  Medications Ordered in ED Medications - No data to display  ED Course/ Medical Decision Making/ A&P   {   Click here for ABCD2, HEART and other calculatorsREFRESH Note before signing :1}                          Medical Decision Making 82 year old female who presents with concern for weakness and knee pain.   VS  normal on intake. Cardiopulmonary exam is normal, abdominal exam is benign. Mucous membranes are dry, 1+ LE edema nonpitting.  TTP over the lateral aspect of the left knee.     ***  {Document critical care time when appropriate:1} {Document review of labs and clinical decision tools ie heart score, Chads2Vasc2 etc:1}  {Document your independent review of radiology images, and any outside records:1} {Document your discussion with family members, caretakers, and with consultants:1} {Document social determinants of health affecting pt's care:1} {Document your decision making why or why not admission, treatments were needed:1} Final Clinical Impression(s) / ED Diagnoses Final diagnoses:  None    Rx / DC Orders ED Discharge Orders     None

## 2022-10-19 ENCOUNTER — Encounter (HOSPITAL_COMMUNITY): Payer: Self-pay

## 2022-10-19 ENCOUNTER — Other Ambulatory Visit: Payer: Self-pay

## 2022-10-19 LAB — URINALYSIS, ROUTINE W REFLEX MICROSCOPIC
Bacteria, UA: NONE SEEN
Bilirubin Urine: NEGATIVE
Glucose, UA: 50 mg/dL — AB
Hgb urine dipstick: NEGATIVE
Ketones, ur: NEGATIVE mg/dL
Leukocytes,Ua: NEGATIVE
Nitrite: NEGATIVE
Protein, ur: 30 mg/dL — AB
Specific Gravity, Urine: 1.029 (ref 1.005–1.030)
pH: 6 (ref 5.0–8.0)

## 2022-10-19 LAB — COMPREHENSIVE METABOLIC PANEL
ALT: 14 U/L (ref 0–44)
AST: 17 U/L (ref 15–41)
Albumin: 3.4 g/dL — ABNORMAL LOW (ref 3.5–5.0)
Alkaline Phosphatase: 55 U/L (ref 38–126)
Anion gap: 7 (ref 5–15)
BUN: 23 mg/dL (ref 8–23)
CO2: 30 mmol/L (ref 22–32)
Calcium: 9.7 mg/dL (ref 8.9–10.3)
Chloride: 103 mmol/L (ref 98–111)
Creatinine, Ser: 0.88 mg/dL (ref 0.44–1.00)
GFR, Estimated: >60 mL/min (ref >60)
Glucose, Bld: 120 mg/dL — ABNORMAL HIGH (ref 70–99)
Potassium: 3.9 mmol/L (ref 3.5–5.1)
Sodium: 140 mmol/L (ref 135–145)
Total Bilirubin: 1 mg/dL (ref 0.3–1.2)
Total Protein: 6.8 g/dL (ref 6.5–8.1)

## 2022-10-19 LAB — TROPONIN I (HIGH SENSITIVITY): Troponin I (High Sensitivity): 7 ng/L (ref <18)

## 2022-10-19 LAB — BRAIN NATRIURETIC PEPTIDE: B Natriuretic Peptide: 459.6 pg/mL — ABNORMAL HIGH (ref 0.0–100.0)

## 2022-10-19 MED ORDER — CARVEDILOL 12.5 MG PO TABS
12.5000 mg | ORAL_TABLET | Freq: Two times a day (BID) | ORAL | Status: AC
  Administered 2022-10-19 – 2022-10-21 (×6): 12.5 mg via ORAL
  Filled 2022-10-19 (×6): qty 1

## 2022-10-19 MED ORDER — ACETAMINOPHEN 500 MG PO TABS
1000.0000 mg | ORAL_TABLET | Freq: Once | ORAL | Status: AC
  Administered 2022-10-19: 1000 mg via ORAL
  Filled 2022-10-19: qty 2

## 2022-10-19 MED ORDER — ESCITALOPRAM OXALATE 10 MG PO TABS
10.0000 mg | ORAL_TABLET | Freq: Every day | ORAL | Status: AC
  Administered 2022-10-19 – 2022-10-21 (×3): 10 mg via ORAL
  Filled 2022-10-19 (×3): qty 1

## 2022-10-19 MED ORDER — ACETAMINOPHEN 325 MG PO TABS
650.0000 mg | ORAL_TABLET | Freq: Four times a day (QID) | ORAL | Status: DC | PRN
  Administered 2022-10-19 – 2022-10-21 (×3): 650 mg via ORAL
  Filled 2022-10-19 (×2): qty 2

## 2022-10-19 MED ORDER — LEVOTHYROXINE SODIUM 75 MCG PO TABS
75.0000 ug | ORAL_TABLET | Freq: Every day | ORAL | Status: AC
  Administered 2022-10-19 – 2022-10-21 (×3): 75 ug via ORAL
  Filled 2022-10-19 (×3): qty 1

## 2022-10-19 MED ORDER — ACETAMINOPHEN 325 MG PO TABS
650.0000 mg | ORAL_TABLET | Freq: Once | ORAL | Status: AC
  Administered 2022-10-19: 650 mg via ORAL
  Filled 2022-10-19: qty 2

## 2022-10-19 MED ORDER — ACETAMINOPHEN 325 MG PO TABS
325.0000 mg | ORAL_TABLET | Freq: Once | ORAL | Status: DC
  Filled 2022-10-19: qty 1

## 2022-10-19 MED ORDER — APIXABAN 5 MG PO TABS
5.0000 mg | ORAL_TABLET | Freq: Two times a day (BID) | ORAL | Status: AC
  Administered 2022-10-19 – 2022-10-21 (×6): 5 mg via ORAL
  Filled 2022-10-19 (×6): qty 1

## 2022-10-19 NOTE — ED Notes (Signed)
Patient assisted to standing at this time with 2 assist. Patient able to shuffle slightly but unable to take a step.

## 2022-10-19 NOTE — Evaluation (Addendum)
Physical Therapy Evaluation Patient Details Name: Jenna [name] MRN: [id number] DOB: 06/09/1941 Today's Date: 10/19/2022  History of Present Illness  Pt is a 82 y.o. F who presents 10/18/2022 with generalized weakness and L knee pain. Significant PMH: R TKA, TAVR, depression.  Clinical Impression  PTA, pt lives alone, uses 3 wheeled walker for community ambulation and is independent with ADL's. Per pt and chart review, pt with decline recently in mobility due to knee pain and was unable to stand from couch. Pt presents with generalized weakness, L knee pain, impaired standing balance, decreased activity tolerance. Pt requiring min -mod assist for functional mobility. Ambulating ~10 ft with a walker, utilizing a step to pattern. Pt reports her family cannot provide 24/7 assist. Recommend ST SNF to address deficits and maximize functional mobility.       Recommendations for follow up therapy are one component of a multi-disciplinary discharge planning process, led by the attending physician.  Recommendations may be updated based on patient status, additional functional criteria and insurance authorization.  Follow Up Recommendations Skilled nursing-short term rehab (<3 hours/day) Can patient physically be transported by private vehicle: Yes    Assistance Recommended at Discharge PRN  Patient can return home with the following  A little help with walking and/or transfers;A little help with bathing/dressing/bathroom;Assist for transportation;Help with stairs or ramp for entrance;Assistance with cooking/housework    Equipment Recommendations BSC/3in1  Recommendations for Other Services       Functional Status Assessment Patient has had a recent decline in their functional status and demonstrates the ability to make significant improvements in function in a reasonable and predictable amount of time.     Precautions / Restrictions Precautions Precautions: Fall Restrictions Weight Bearing  Restrictions: No      Mobility  Bed Mobility Overal bed mobility: Needs Assistance Bed Mobility: Supine to Sit, Sit to Supine     Supine to sit: Min assist Sit to supine: Mod assist   General bed mobility comments: Min-modA for supine <> sit from stretcher    Transfers Overall transfer level: Needs assistance Equipment used: Rolling walker (2 wheels) Transfers: Sit to/from Stand Sit to Stand: Min assist, +2 safety/equipment           General transfer comment: MinA to rise from stretcher x 2    Ambulation/Gait Ambulation/Gait assistance: Min assist, +2 safety/equipment Gait Distance (Feet): 10 Feet Assistive device: Rolling walker (2 wheels) Gait Pattern/deviations: Decreased stride length, Shuffle, Step-to pattern Gait velocity: decreased Gait velocity interpretation: <1.8 ft/sec, indicate of risk for recurrent falls   General Gait Details: Very slow gait, intermittent minA for steering walker, cues for sequencing/technique  Stairs            Wheelchair Mobility    Modified Rankin (Stroke Patients Only)       Balance Overall balance assessment: Needs assistance Sitting-balance support: Feet supported Sitting balance-Leahy Scale: Fair     Standing balance support: Bilateral upper extremity supported Standing balance-Leahy Scale: Poor Standing balance comment: reliant on RW                             Pertinent Vitals/Pain Pain Assessment Pain Assessment: Faces Faces Pain Scale: Hurts even more Pain Location: L knee Pain Descriptors / Indicators: Aching, Discomfort, Grimacing, Guarding Pain Intervention(s): Monitored during session, Limited activity within patient's tolerance    Home Living Family/patient expects to be discharged to:: Private residence Living Arrangements: Alone Available Help at Discharge: Family;Available  PRN/intermittently Type of Home: House Home Access: Stairs to enter   Entrance Stairs-Number of Steps: 1    Home Layout: One level Home Equipment: Conservation officer, nature (2 wheels);Cane - single point;Other (comment) (3 wheeled walker, upright walker)      Prior Function Prior Level of Function : Needs assist             Mobility Comments: uses 3 wheeled walker for community ambulation ADLs Comments: eating oatmeal for breakfast and sandwiches, pt family brings meals intermittently     Hand Dominance        Extremity/Trunk Assessment   Upper Extremity Assessment Upper Extremity Assessment: Generalized weakness    Lower Extremity Assessment Lower Extremity Assessment: Generalized weakness       Communication   Communication: Other (comment) (delayed verbal response)  Cognition Arousal/Alertness: Awake/alert Behavior During Therapy: Flat affect Overall Cognitive Status: No family/caregiver present to determine baseline cognitive functioning                                 General Comments: Pt with delayed verbal response, note hx of depression. Somewhat unclear/poor historian        General Comments      Exercises     Assessment/Plan    PT Assessment Patient needs continued PT services  PT Problem List Decreased strength;Decreased activity tolerance;Decreased balance;Decreased mobility;Pain       PT Treatment Interventions DME instruction;Gait training;Stair training;Functional mobility training;Therapeutic activities;Therapeutic exercise;Balance training;Patient/family education    PT Goals (Current goals can be found in the Care Plan section)  Acute Rehab PT Goals Patient Stated Goal: did not state PT Goal Formulation: With patient Time For Goal Achievement: 11/02/22 Potential to Achieve Goals: Good    Frequency Min 3X/week     Co-evaluation               AM-PAC PT "6 Clicks" Mobility  Outcome Measure Help needed turning from your back to your side while in a flat bed without using bedrails?: A Little Help needed moving from lying on  your back to sitting on the side of a flat bed without using bedrails?: A Little Help needed moving to and from a bed to a chair (including a wheelchair)?: A Little Help needed standing up from a chair using your arms (e.g., wheelchair or bedside chair)?: A Little Help needed to walk in hospital room?: A Little Help needed climbing 3-5 steps with a railing? : A Lot 6 Click Score: 17    End of Session Equipment Utilized During Treatment: Gait belt Activity Tolerance: Patient tolerated treatment well Patient left: in bed;with call bell/phone within reach Nurse Communication: Mobility status PT Visit Diagnosis: Unsteadiness on feet (R26.81);Muscle weakness (generalized) (M62.81);Difficulty in walking, not elsewhere classified (R26.2)    Time: 8546-2703 PT Time Calculation (min) (ACUTE ONLY): 31 min   Charges:   PT Evaluation $PT Eval Low Complexity: 1 Low PT Treatments $Therapeutic Activity: 8-22 mins        Wyona Almas, PT, DPT Acute Rehabilitation Services Office [phone]   Deno Etienne 10/19/2022, 2:57 PM

## 2022-10-19 NOTE — ED Notes (Signed)
404 Bladder scanned at this time.

## 2022-10-19 NOTE — TOC Initial Note (Signed)
Transition of Care [facility]) - Initial/Assessment Note    Patient Details  Name: Jenna [name] MRN: [id number] Date of Birth: 1941/06/18  [facility] CM/SW Contact:    Verdell Carmine, RN Phone Number: 10/19/2022, 9:39 AM  Clinical Narrative:                 Patient from home, presented with knee pain weakness. Lives alone, depression since husband passed away.  She has not been able to get off the couch without max assist, family checks on her, but is not eating well, cannot get to bathroom so wears brief.  Awaiting PAT evaluation for probable SNF placement  Expected Discharge Plan: [place] Barriers to Discharge: SNF Pending bed offer (PT eval pending)   Patient Goals and CMS Choice            Expected Discharge Plan and Services In-house Referral: Clinical Social Work   Post Acute Care Choice: [place]                                        Prior Living Arrangements/Services   Lives with:: Self Patient language and need for interpreter reviewed:: Yes        Need for Family Participation in Patient Care: Yes (Comment) Care giver support system in place?: Yes (comment) (however cannot care for her as she is currently)   Criminal Activity/Legal Involvement Pertinent to Current Situation/Hospitalization: No - Comment as needed  Activities of Daily Living      Permission Sought/Granted                  Emotional Assessment     Affect (typically observed): Tearful/Crying, Flat Orientation: : Oriented to Self, Oriented to Place, Oriented to  Time Alcohol / Substance Use: Not Applicable Psych Involvement: No (comment)  Admission diagnosis:  FTT Patient Active Problem List   Diagnosis Date Noted   Secondary hypercoagulable state ([place]) 09/24/2022   Postprocedural hematoma of a circulatory system organ or structure following a cardiac catheterization 10/31/2021   S/P TAVR (transcatheter aortic valve  replacement) 10/30/2021   Severe aortic valve stenosis    Facet arthropathy 07/19/2020   Spondylosis without myelopathy or radiculopathy, lumbar region 12/07/2019   Chronic back pain 10/28/2019   Chronic kidney disease (CKD), stage III (moderate) (HCC) 09/21/2019   Moderate episode of recurrent major depressive disorder ([place]) 09/21/2019   Primary insomnia 05/31/2019   Hyperlipidemia 03/03/2019   Status post total knee replacement, right 07/28/2018   Primary osteoarthritis of right knee 12/08/2017   Nonischemic cardiomyopathy ([place]) 05/14/2017   S/P ablation of atrial flutter/ fib 01/10/17 01/11/2017   Normocytic anemia 12/17/2016   Cystitis    Persistent atrial fibrillation ([place]) 11/15/2016   Depressed 07/04/2015   Peripheral vertigo 12/16/2014   Vertigo 12/16/2014   HTN (hypertension) 07/26/2013   Hypothyroidism 07/26/2013   Anxiety 07/26/2013   GERD (gastroesophageal reflux disease) 07/26/2013   OA (osteoarthritis) 07/26/2013   Obstructive sleep apnea 06/20/2008   RESTLESS LEGS SYNDROME 06/20/2008   PCP:  Chesley Noon, MD Pharmacy:   [id number] [place], [place] - [address] [place] [id number]: [phone]: [phone]    Social Determinants of Health (SDOH) Social History: SDOH Screenings   Tobacco Use: Low Risk  (10/19/2022)  SDOH Interventions:     Readmission Risk Interventions     No data to display

## 2022-10-20 ENCOUNTER — Emergency Department (HOSPITAL_COMMUNITY): Payer: Medicare PPO

## 2022-10-20 NOTE — Progress Notes (Signed)
CSW spoke with patient's daughter Kenney Houseman who confirms she and the patient want to pursue STR at SNF at discharge. Kenney Houseman reports the patient has been to several facilities in the past. Kenney Houseman states she does not want information sent to [employer] or [employer] [employer].   CSW completed FL2 and faxed patient's clinicals out for review to obtain bed offers.  Madilyn Fireman, MSW, LCSW Transitions of Care  Clinical Social Worker II [phone]

## 2022-10-20 NOTE — Evaluation (Signed)
Occupational Therapy Evaluation Patient Details Name: Jenna [name] MRN: [id number] DOB: 24-Aug-1941 Today's Date: 10/20/2022   History of Present Illness Pt is a 82 y.o. F who presents 10/18/2022 with generalized weakness and L knee pain. Significant PMH: R TKA, TAVR, depression.   Clinical Impression   Jenna [name] was evaluated s/p the above admission list, she is generally mod I at baseline with use of DME. Upon evaluation pt had functional limitations due to L knee pain, decreased activity tolerance and generalized weakness. Overall she required min G - supervision A for functional mobility with RW and increased time for transfers due to L knee pain. She mobilized >household distance with RW and no over safety concerns. She required up to min A for LB ADLs and otherwise is likely performing close to her baseline. OT to continue to follow acutely. Recommend d/c to home with [facility] and support of family initially.      Recommendations for follow up therapy are one component of a multi-disciplinary discharge planning process, led by the attending physician.  Recommendations may be updated based on patient status, additional functional criteria and insurance authorization.   Follow Up Recommendations  Home health OT     Assistance Recommended at Discharge Frequent or constant Supervision/Assistance  Patient can return home with the following A little help with walking and/or transfers;A little help with bathing/dressing/bathroom;Assistance with cooking/housework;Assist for transportation;Help with stairs or ramp for entrance    Functional Status Assessment  Patient has had a recent decline in their functional status and demonstrates the ability to make significant improvements in function in a reasonable and predictable amount of time.  Equipment Recommendations  None recommended by OT    Recommendations for Other Services       Precautions / Restrictions Precautions Precautions:  Fall Precaution Comments: chronic L knee pain Restrictions Weight Bearing Restrictions: No      Mobility Bed Mobility               General bed mobility comments: OOB in chair upon arrival    Transfers Overall transfer level: Needs assistance Equipment used: Rolling walker (2 wheels) Transfers: Sit to/from Stand Sit to Stand: Supervision           General transfer comment: increased time due to L knee pain      Balance Overall balance assessment: Needs assistance Sitting-balance support: Feet supported Sitting balance-Leahy Scale: Fair     Standing balance support: Single extremity supported, During functional activity Standing balance-Leahy Scale: Fair Standing balance comment: able to groom in standing with only 1 UE supported externally                           ADL either performed or assessed with clinical judgement   ADL Overall ADL's : Needs assistance/impaired Eating/Feeding: Independent;Sitting   Grooming: Supervision/safety;Standing Grooming Details (indicate cue type and reason): at the sink Upper Body Bathing: Set up;Sitting   Lower Body Bathing: Minimal assistance;Sit to/from stand   Upper Body Dressing : Set up;Sitting   Lower Body Dressing: Minimal assistance;Sit to/from stand   Toilet Transfer: Supervision/safety;Ambulation;Grab bars;Rolling walker (2 wheels)   Toileting- Clothing Manipulation and Hygiene: Supervision/safety;Sitting/lateral lean       Functional mobility during ADLs: Min guard;Rolling walker (2 wheels) General ADL Comments: likely near functional baseline     Vision Baseline Vision/History: 1 Wears glasses;6 Macular Degeneration Patient Visual Report: No change from baseline Vision Assessment?: No apparent visual deficits  Perception Perception Perception Tested?: No   Praxis Praxis Praxis tested?: Not tested    Pertinent Vitals/Pain Pain Assessment Pain Assessment: Faces Faces Pain Scale:  Hurts a little bit Pain Location: L knee Pain Descriptors / Indicators: Aching, Discomfort, Grimacing, Guarding Pain Intervention(s): Limited activity within patient's tolerance, Monitored during session     Hand Dominance Right   Extremity/Trunk Assessment Upper Extremity Assessment Upper Extremity Assessment: Generalized weakness   Lower Extremity Assessment Lower Extremity Assessment: Generalized weakness   Cervical / Trunk Assessment Cervical / Trunk Assessment: Normal   Communication Communication Communication: No difficulties (verbose)   Cognition Arousal/Alertness: Awake/alert Behavior During Therapy: Flat affect Overall Cognitive Status: Within Functional Limits for tasks assessed                   General Comments: overall WFL and appropriate this date. Verbose and benefits from cues for attention     General Comments  VSS on RA, pt reported mild dizziness at the end of the session, BP stable            Home Living Family/patient expects to be discharged to:: Private residence Living Arrangements: Alone Available Help at Discharge: Family;Available PRN/intermittently Type of Home: House Home Access: Stairs to enter Entrance Stairs-Number of Steps: 1   Home Layout: One level     Bathroom Shower/Tub: Tub/shower unit;Walk-in shower;Sponge bathes at baseline         Home Equipment: Conservation officer, nature (2 wheels);Cane - single point;Other (comment)          Prior Functioning/Environment Prior Level of Function : Needs assist             Mobility Comments: uses 3 wheeled walker for community ambulation ADLs Comments: eating oatmeal for breakfast and sandwiches, pt family brings meals intermittently        OT Problem List: Decreased strength;Decreased range of motion;Decreased activity tolerance;Impaired balance (sitting and/or standing);Pain      OT Treatment/Interventions: Self-care/ADL training;Therapeutic exercise;DME and/or AE  instruction;Therapeutic activities;Patient/family education;Balance training    OT Goals(Current goals can be found in the care plan section) Acute Rehab OT Goals Patient Stated Goal: home OT Goal Formulation: With patient Time For Goal Achievement: 11/03/22 Potential to Achieve Goals: Good ADL Goals Pt Will Perform Lower Body Dressing: with modified independence;sit to/from stand Pt Will Transfer to Toilet: with modified independence;ambulating Additional ADL Goal #1: Pt will indep complete IADL medicaiton management task Additional ADL Goal #2: Pt will tolerate at least 10 minutes of OOB functional activity to demonstrate increased tolerance  OT Frequency: Min 2X/week       AM-PAC OT "6 Clicks" Daily Activity     Outcome Measure Help from another person eating meals?: None Help from another person taking care of personal grooming?: A Little Help from another person toileting, which includes using toliet, bedpan, or urinal?: A Little Help from another person bathing (including washing, rinsing, drying)?: A Little Help from another person to put on and taking off regular upper body clothing?: None Help from another person to put on and taking off regular lower body clothing?: A Little 6 Click Score: 20   End of Session Equipment Utilized During Treatment: Gait belt;Rolling walker (2 wheels) Nurse Communication: Mobility status  Activity Tolerance: Patient tolerated treatment well Patient left: in chair;with call bell/phone within reach  OT Visit Diagnosis: Unsteadiness on feet (R26.81);Other abnormalities of gait and mobility (R26.89);Muscle weakness (generalized) (M62.81);Pain  Time: 4103-0131 OT Time Calculation (min): 18 min Charges:  OT General Charges $OT Visit: 1 Visit OT Evaluation $OT Eval Moderate Complexity: 1 Mod    Yides Saidi D Causey 10/20/2022, 12:09 PM

## 2022-10-20 NOTE — NC FL2 (Signed)
[place] LEVEL OF CARE FORM     IDENTIFICATION  Patient Name: Jenna [name] Birthdate: 07/23/1941 Sex: female Admission Date (Current Location): 10/18/2022  [facility] and [place] Number:  Herbalist and Address:  [facility] [place], [address] [place], [place] [address]      Provider Number: [id number]  Attending Physician Name and Address:  Default, Provider, MD  Relative Name and Phone Number:       Current Level of Care: Hospital Recommended Level of Care: [place] Prior Approval Number:    Date Approved/Denied:   PASRR Number: [id number]  Discharge Plan: SNF    Current Diagnoses: Patient Active Problem List   Diagnosis Date Noted   Secondary hypercoagulable state ([place]) 09/24/2022   Postprocedural hematoma of a circulatory system organ or structure following a cardiac catheterization 10/31/2021   S/P TAVR (transcatheter aortic valve replacement) 10/30/2021   Severe aortic valve stenosis    Facet arthropathy 07/19/2020   Spondylosis without myelopathy or radiculopathy, lumbar region 12/07/2019   Chronic back pain 10/28/2019   Chronic kidney disease (CKD), stage III (moderate) ([place]) 09/21/2019   Moderate episode of recurrent major depressive disorder ([place]) 09/21/2019   Primary insomnia 05/31/2019   Hyperlipidemia 03/03/2019   Status post total knee replacement, right 07/28/2018   Primary osteoarthritis of right knee 12/08/2017   Nonischemic cardiomyopathy ([place]) 05/14/2017   S/P ablation of atrial flutter/ fib 01/10/17 01/11/2017   Normocytic anemia 12/17/2016   Cystitis    Persistent atrial fibrillation ([place]) 11/15/2016   Depressed 07/04/2015   Peripheral vertigo 12/16/2014   Vertigo 12/16/2014   HTN (hypertension) 07/26/2013   Hypothyroidism 07/26/2013   Anxiety 07/26/2013   GERD (gastroesophageal reflux disease) 07/26/2013   OA (osteoarthritis) 07/26/2013   Obstructive sleep apnea 06/20/2008    RESTLESS LEGS SYNDROME 06/20/2008    Orientation RESPIRATION BLADDER Height & Weight     Self, Time, Situation, Place  Normal Continent Weight: 187 lb (84.8 kg) Height:  '5\' 5"'$  (165.1 cm)  BEHAVIORAL SYMPTOMS/MOOD NEUROLOGICAL BOWEL NUTRITION STATUS      Continent Diet (See discharge summary)  AMBULATORY STATUS COMMUNICATION OF NEEDS Skin   Limited Assist Verbally Normal                       Personal Care Assistance Level of Assistance  Bathing, Feeding, Dressing Bathing Assistance: Limited assistance Feeding assistance: Independent Dressing Assistance: Limited assistance     Functional Limitations Info  Sight, Hearing, Speech Sight Info: Adequate Hearing Info: Adequate Speech Info: Adequate    SPECIAL CARE FACTORS FREQUENCY  OT (By licensed OT), PT (By licensed PT)     PT Frequency: 5x weekly OT Frequency: 5x weekly            Contractures Contractures Info: Not present    Additional Factors Info  Code Status, Allergies Code Status Info: Full Code Allergies Info: Rosuvastatin, Lovastatin           Current Medications (10/20/2022):  This is the current hospital active medication list Current Facility-Administered Medications  Medication Dose Route Frequency Provider Last Rate Last Admin   acetaminophen (TYLENOL) tablet 325 mg  325 mg Oral Once Horton, Kristie M, DO       acetaminophen (TYLENOL) tablet 650 mg  650 mg Oral Q6H PRN Lorelle Gibbs, DO   650 mg at 10/20/22 0648   apixaban (ELIQUIS) tablet 5 mg  5 mg Oral BID Sponseller, Rebekah R, PA-C  5 mg at 10/20/22 1033   carvedilol (COREG) tablet 12.5 mg  12.5 mg Oral BID WC Sponseller, Rebekah R, PA-C   12.5 mg at 10/20/22 1033   escitalopram (LEXAPRO) tablet 10 mg  10 mg Oral Daily Sponseller, Rebekah R, PA-C   10 mg at 10/20/22 1033   levothyroxine (SYNTHROID) tablet 75 mcg  75 mcg Oral Q0600 Sponseller, Rebekah R, PA-C   75 mcg at 10/20/22 0620   Current Outpatient Medications  Medication Sig  Dispense Refill   acetaminophen (TYLENOL) 500 MG tablet Take 1,000 mg by mouth every 6 (six) hours as needed for moderate pain.     ALPRAZolam (XANAX) 0.5 MG tablet Take 0.5 mg by mouth 2 (two) times daily.      amoxicillin (AMOXIL) 500 MG tablet Take 4 tablets by mouth 1 hour before dental procedures and cleanings 12 tablet 6   apixaban (ELIQUIS) 5 MG TABS tablet Take 1 tablet (5 mg total) by mouth 2 (two) times daily. 180 tablet 1   atorvastatin (LIPITOR) 20 MG tablet Take 1 tablet (20 mg total) by mouth every Monday, Wednesday, and Friday. 90 tablet 0   Calcium Carb-Cholecalciferol (CALCIUM 600 + D PO) Take 1 tablet by mouth 2 (two) times daily with a meal.     carvedilol (COREG) 6.25 MG tablet Take 2 tablets (12.5 mg total) by mouth 2 (two) times daily with a meal. 180 tablet 3   escitalopram (LEXAPRO) 10 MG tablet Take 10 mg by mouth daily.     hypromellose (SYSTANE OVERNIGHT THERAPY) 0.3 % GEL ophthalmic ointment Place 2 application into both eyes at bedtime.     levothyroxine (SYNTHROID) 75 MCG tablet Take 75 mcg by mouth daily before breakfast.     loratadine (CLARITIN) 10 MG tablet Take 10 mg by mouth daily.     Magnesium 500 MG TABS Take 500 mg by mouth every Monday, Wednesday, and Friday.     metolazone (ZAROXOLYN) 2.5 MG tablet Take 2.5 mg by mouth daily as needed for edema.     Multiple Vitamins-Minerals (PRESERVISION AREDS 2) CAPS Take 1 capsule by mouth 2 (two) times daily.     ONETOUCH ULTRA test strip      Polyethyl Glycol-Propyl Glycol (SYSTANE OP) Place 1-2 drops into both eyes 3 (three) times daily.     pyridOXINE (VITAMIN B-6) 100 MG tablet Take 100 mg by mouth daily with supper.     rOPINIRole (REQUIP) 0.5 MG tablet Take 0.5-1 mg by mouth See admin instructions. Take 0.5 mg at lunch, 0.5 mg at supper, and 1 mg at bedtime     spironolactone (ALDACTONE) 25 MG tablet TAKE 1 TABLET (25 MG TOTAL) BY MOUTH DAILY. 90 tablet 2   zinc gluconate 50 MG tablet Take 50 mg by mouth daily.        Discharge Medications: Please see discharge summary for a list of discharge medications.  Relevant Imaging Results:  Relevant Lab Results:   Additional Information SSN: [id number]  [name], LCSW

## 2022-10-20 NOTE — ED Notes (Signed)
Ambulatory to bathroom slowly with walker. No complaints. No signs of distress.

## 2022-10-20 NOTE — ED Notes (Signed)
One nurse assist to stand to walker. Patient able to use walker and walk unassisted slowly to bathroom.

## 2022-10-20 NOTE — ED Notes (Signed)
EDP at bedside  

## 2022-10-21 ENCOUNTER — Ambulatory Visit (INDEPENDENT_AMBULATORY_CARE_PROVIDER_SITE_OTHER): Payer: Medicare PPO

## 2022-10-21 ENCOUNTER — Ambulatory Visit: Payer: Medicare PPO | Admitting: Orthopaedic Surgery

## 2022-10-21 DIAGNOSIS — I4819 Other persistent atrial fibrillation: Secondary | ICD-10-CM

## 2022-10-21 NOTE — Progress Notes (Signed)
Pending bed offers for SNF placement.

## 2022-10-21 NOTE — Progress Notes (Signed)
Insurance Auth was started with [employer].  Dravin Lance Tarpley-Carter, MSW, LCSW-A Pronouns:  She/Her/Hers [facility] HealthTransitions of Care Clinical Social Worker Direct Number:  [phone] [email or web address]

## 2022-10-21 NOTE — ED Notes (Signed)
Got patient up to recliner and set up for breakfast. Watching TV

## 2022-10-21 NOTE — ED Notes (Signed)
Pt ambulated with assistance and walker to the restroom and back.

## 2022-10-21 NOTE — ED Provider Notes (Addendum)
Emergency Medicine Observation Re-evaluation Note  Jenna [name] is a 82 y.o. female, seen on rounds today.  Pt initially presented to the ED for complaints of Failure To Thrive (BIB EMS from home for failure to thrive, per family pt has been bedbound for the past two days due to left knee pain, per pt oral intake has been decreased, has a-fib controlled and is scheduled for cardioversion next Wednesday, VSS, Alert and oriented x4) Currently, the patient is resting.  Physical Exam  BP 117/80   Pulse (!) 46   Temp 98.4 F (36.9 C) (Oral)   Resp 17   Ht '5\' 5"'$  (1.651 m)   Wt 84.8 kg   SpO2 93%   BMI 31.12 kg/m  Physical Exam General: NAD Cardiac: well perfused Lungs: even and unlabored Psych: no agitation  ED Course / MDM  EKG:EKG Interpretation  Date/Time:  Saturday October 19 2022 00:45:12 EST Ventricular Rate:  119 PR Interval:    QRS Duration: 71 QT Interval:  343 QTC Calculation: 483 R Axis:   -21 Text Interpretation: Atrial fibrillation Borderline left axis deviation Anterior infarct, old Confirmed by Garnette Gunner [phone]) on 10/20/2022 1:33:46 PM  I have reviewed the labs performed to date as well as medications administered while in observation.  Recent changes in the last 24 hours include social work coordinating SNF placement. OT evaluated yesterday 1/14: recommending DC to home for home health OT..PT evaluated on 1/13: recommended SNF placement.  Plan  Current plan is for SNF placement, SW coordinating.  Reached out to cardiology given the patient's scheduled cardioversion on Wednesday.  Spoke with the cardiology master.  If the patient is still in the emergency department on Wednesday morning, cardiology can be paged to take the patient to her scheduled cardioversion.  Otherwise it will need to be rescheduled outpatient.    Regan Lemming, MD 10/21/22 5397    Regan Lemming, MD 10/21/22 1232

## 2022-10-21 NOTE — ED Notes (Signed)
Pt appears very tearful and sad. Pt states she just has no drive since her husband passed away. Emotional support and comfort provided to pt. Pt accepting.

## 2022-10-21 NOTE — ED Notes (Signed)
Got patient changed and put into dry clothes and in the bed.

## 2022-10-21 NOTE — Progress Notes (Signed)
MD provider reached out to cardiology in regards to patients scheduled cardioversion on 10/23/22. CSW was told that if patient is still in the ED then cardiology will need to be paged so patients can be transported to her appointment. Family reports admitting time is at 9:30 AM 10/23/22.

## 2022-10-21 NOTE — Progress Notes (Signed)
TOC CSW spoke with pt and pts daughter, Jenna [name] who accepts [facility].  [name], MSW, LCSW-A Pronouns:  She/Her/Hers [facility] Health[facility]itions of Care Clinical Social Worker Direct Number:  [phone] [name]

## 2022-10-21 NOTE — Progress Notes (Signed)
TOC CSW spoke with both pt and pts daughter, Ewell Poe [phone].  Kenney Houseman is on her way to the hospital to discuss bed offer with pt.  They will notify CSW of their decision.  Latiana Tomei Tarpley-Carter, MSW, LCSW-A Pronouns:  She/Her/Hers [facility] HealthTransitions of Care Clinical Social Worker Direct Number:  [phone] [email or web address]

## 2022-10-22 ENCOUNTER — Telehealth: Payer: Self-pay | Admitting: Cardiology

## 2022-10-22 MED ORDER — TRAMADOL HCL 50 MG PO TABS: 50.0000 mg | ORAL_TABLET | Freq: Three times a day (TID) | ORAL | 0 refills | Status: DC | PRN

## 2022-10-22 MED ORDER — APIXABAN 5 MG PO TABS: 5.0000 mg | ORAL_TABLET | Freq: Once | ORAL | Status: DC

## 2022-10-22 NOTE — ED Notes (Signed)
Pts breakfast tray set up. Pt in room resting A&O x4. Denies any needs at this time. Pt updated on plan of care for cardioversion tomorrow.

## 2022-10-22 NOTE — Discharge Instructions (Addendum)
It was our pleasure to provide your ER care today - we hope that you feel better.  Take acetaminophen as need for pain. You mal also take ultram as need for pain.  Follow up with your doctor/orthopedist in the next 1-2 weeks.  Also follow up with your doctor regarding your afib as planned.   Return to ER if worse, new symptoms, fevers, severe/intractable pain, chest pain, trouble breathing, or other emergency concern.

## 2022-10-22 NOTE — Progress Notes (Signed)
Patient is going to [facility] room [id number], number for report [phone]  Patients daughter Ewell Poe [phone] notified. [facility] is not able to provide transportation to cardioversion tomorrow morning. Patients daughter aware she will have to provide transportation.

## 2022-10-22 NOTE — ED Provider Notes (Addendum)
Emergency Medicine Observation Re-evaluation Note  Jenna [name] is a 82 y.o. female, seen on rounds today.  Pt initially presented to the ED for complaints of Failure To Thrive (BIB EMS from home for failure to thrive, per family pt has been bedbound for the past two days due to left knee pain, per pt oral intake has been decreased, has a-fib controlled and is scheduled for cardioversion next Wednesday, VSS, Alert and oriented x4) Currently, the patient is calm, nad, pt awaiting SNF insurance auth for [facility].   Physical Exam  BP (!) 146/98   Pulse 96   Temp 97.8 F (36.6 C) (Oral)   Resp 17   Ht 1.651 m ('5\' 5"'$ )   Wt 84.8 kg   SpO2 99%   BMI 31.12 kg/m  Physical Exam General: calm, nad.  Cardiac: regular rate.  Lungs: breathing comfortably.   ED Course / MDM    I have reviewed the labs performed to date as well as medications administered while in observation.  Recent changes in the last 24 hours include ED obs, reassessment, toc placement.   Plan  Current plan is for TOC placement. Just awaiting insurance authorization.    Lajean Saver, MD 10/22/22 0934   TOC team indicates pt can be d/c'd to [facility].  Pt eating breakfast, no new c/o this AM.      Lajean Saver, MD 10/22/22 1002

## 2022-10-22 NOTE — ED Notes (Signed)
Pt was heard crying and saying "oh my, it hurts, it hurts." Upon assessment pt states that she is having lower abdomen pain and having constant pressure/urge to void. Increase pain with palpation. This RN scanned pts bladder, PVR bladder scan showed 440m. Pt was informed of protocol regarding in/out cath for PVR over 3559m Pt verbalized understanding but did not give permission for the in/out catheter. Pt states she will attempt to void on her on.

## 2022-10-22 NOTE — Telephone Encounter (Signed)
Pt c/o medication issue:  1. Name of Medication: apixaban (ELIQUIS) 5 MG TABS tablet   2. How are you currently taking this medication (dosage and times per day)?  N/A  3. Are you having a reaction (difficulty breathing--STAT)? No   4. What is your medication issue? Daughter is calling stating she is on the way to pick up the pt from the hospital and is wanting to know if the patient should take her eliquis today due to her cardioversion scheduled for tomorrow. She states that to her knowledge the patient has not had any of her morning meds today so she plans to give them to her when she arrives, but was unsure about the eliquis. Please advise.

## 2022-10-22 NOTE — Progress Notes (Signed)
[facility] denied patient for transport. Daughter plans on coming to the ED to take her mother to [facility]

## 2022-10-22 NOTE — ED Notes (Signed)
[facility] Called By Letta Median, ETA Hour.

## 2022-10-22 NOTE — Telephone Encounter (Signed)
I spoke with patient's daughter and gave her information. Daughter confirmed patient did not take Eliquis earlier today.  She will give to patient now and inform nursing facility to give to patient tonight at 11 PM and tomorrow at 8:30.  Daughter reports patient had not missed any doses of Eliquis prior to going to ED.

## 2022-10-22 NOTE — Telephone Encounter (Signed)
Reviewed with Dr Marlou Porch who is scheduled to do cardioversion tomorrow.  Dr Marlou Porch was able to view medicine administration record for ED and patient did not receive AM medications today.  Per Dr Marlou Porch daughter can give patient Eliquis now and evening dose around 11 PM today.  OK to have cardioversion as scheduled if patient able to take Eliquis at these times.  Patient can take tomorrow's dose of Eliquis at the time she usually takes (around 8:30 AM) I placed call to patient's daughter and left message to call office.

## 2022-10-22 NOTE — Progress Notes (Signed)
Physical Therapy Treatment Patient Details Name: Jenna [name] MRN: [id number] DOB: 08-26-1941 Today's Date: 10/22/2022   History of Present Illness Pt is a 82 y.o. F who presents 10/18/2022 with generalized weakness and L knee pain. Significant PMH: R TKA, TAVR, depression.    PT Comments    Making some modest progress towards acute PT goals. States she had a rough day yesterday and has not moved much. Required min assist to get out of bed, CGA to ambulate with RW up to 50 feet today, antalgic pattern with hx of Lt knee issues. No buckling noted. Feel SNF is still appropriate given functional status displayed this morning as she lives alone. Suspect she can progress quickly to a mod I level with robust rehab program. Pt states she was due for a cardioversion tomorrow and missed an orthopedic appointment yesterday. Patient will continue to benefit from skilled physical therapy services to further improve independence with functional mobility.    Recommendations for follow up therapy are one component of a multi-disciplinary discharge planning process, led by the attending physician.  Recommendations may be updated based on patient status, additional functional criteria and insurance authorization.  Follow Up Recommendations  Skilled nursing-short term rehab (<3 hours/day) Can patient physically be transported by private vehicle: Yes   Assistance Recommended at Discharge PRN  Patient can return home with the following A little help with walking and/or transfers;A little help with bathing/dressing/bathroom;Assist for transportation;Help with stairs or ramp for entrance;Assistance with cooking/housework   Equipment Recommendations  BSC/3in1    Recommendations for Other Services       Precautions / Restrictions Precautions Precautions: Fall Precaution Comments: chronic L knee pain Restrictions Weight Bearing Restrictions: No     Mobility  Bed Mobility Overal bed mobility: Needs  Assistance             General bed mobility comments: OOB in chair upon arrival    Transfers Overall transfer level: Needs assistance Equipment used: Rolling walker (2 wheels) Transfers: Sit to/from Stand Sit to Stand: Min assist           General transfer comment: Required min assist to rise from low bed setting. Cues for hand placement. Attempted to perform without assist initially but ultimately required a boost.    Ambulation/Gait Ambulation/Gait assistance: Min assist Gait Distance (Feet): 50 Feet Assistive device: Rolling walker (2 wheels) Gait Pattern/deviations: Step-through pattern, Decreased stance time - left, Decreased stride length, Shuffle, Step-to pattern, Antalgic Gait velocity: slow Gait velocity interpretation: <1.31 ft/sec, indicative of household ambulator   General Gait Details: Educated on safe AD use with RW. Cues for hand placement and step length. initially with step-to pattern, progressed to step-through pattern. Antalgic. Decreased stance time on LT. Improved with cues for increasing UE support on RW but reports shoulder soreness BIL. no overt buckling or balance loss. Attempted several steps without AD, needs min assist, antalgic stepping pattern exacerbated without support, increased sway noted.   Stairs             Wheelchair Mobility    Modified Rankin (Stroke Patients Only)       Balance Overall balance assessment: Needs assistance Sitting-balance support: Feet supported Sitting balance-Leahy Scale: Fair     Standing balance support: During functional activity, No upper extremity supported Standing balance-Leahy Scale: Fair                              Cognition Arousal/Alertness: Awake/alert Behavior  During Therapy: Flat affect Overall Cognitive Status: Within Functional Limits for tasks assessed                                 General Comments: overall WFL and appropriate this date. Verbose  and benefits from cues for attention        Exercises      General Comments General comments (skin integrity, edema, etc.): HR at rest 105, up to 115 with ambulation. SpO2 98% on RA duration of session      Pertinent Vitals/Pain Pain Assessment Pain Assessment: 0-10 Faces Pain Scale: Hurts a little bit Pain Location: L knee Pain Descriptors / Indicators: Aching, Discomfort, Grimacing, Guarding Pain Intervention(s): Monitored during session, Limited activity within patient's tolerance    Home Living                          Prior Function            PT Goals (current goals can now be found in the care plan section) Acute Rehab PT Goals Patient Stated Goal: get well PT Goal Formulation: With patient Time For Goal Achievement: 11/02/22 Potential to Achieve Goals: Good Progress towards PT goals: Progressing toward goals    Frequency    Min 3X/week      PT Plan Current plan remains appropriate    Co-evaluation              AM-PAC PT "6 Clicks" Mobility   Outcome Measure  Help needed turning from your back to your side while in a flat bed without using bedrails?: None Help needed moving from lying on your back to sitting on the side of a flat bed without using bedrails?: None Help needed moving to and from a bed to a chair (including a wheelchair)?: A Little Help needed standing up from a chair using your arms (e.g., wheelchair or bedside chair)?: A Little Help needed to walk in hospital room?: A Little Help needed climbing 3-5 steps with a railing? : A Lot 6 Click Score: 19    End of Session Equipment Utilized During Treatment: Gait belt Activity Tolerance: Patient tolerated treatment well Patient left: in bed;with call bell/phone within reach Nurse Communication: Mobility status PT Visit Diagnosis: Unsteadiness on feet (R26.81);Muscle weakness (generalized) (M62.81);Difficulty in walking, not elsewhere classified (R26.2)     Time:  9528-4132 PT Time Calculation (min) (ACUTE ONLY): 22 min  Charges:  $Gait Training: 8-22 mins                     Candie Mile, PT, DPT Physical Therapist Acute Rehabilitation Services [facility]    Ellouise Newer 10/22/2022, 11:36 AM

## 2022-10-22 NOTE — ED Notes (Signed)
Attempted to turn pt to her side for relieve pressure from her sacrum. Pt refused, sates she prefers to sleep on her back. Pts brief was clean and dry. Fresh ice/water pitcher provided. Pt still appears tearful and anxious. This RN offered to get something to help the pt relax/sleep, pt refused at this time.

## 2022-10-22 NOTE — Telephone Encounter (Signed)
I spoke with patient's daughter. She reports patient is scheduled for cardioversion on 1/17.  Patient was unable to take AM dose of Eliquis on 1/12 until around 1-2 PM.  Took evening dose around 8-9 PM.  She went to ED on Friday evening due to knee swelling and decreased mobility. She has been in ED since then and is being discharged to rehab center today.  Daughter thinks patient was given Eliquis in the ED on her regular schedule prior to today but is not sure.  She will confirm this when she goes to ED to pick patient up this afternoon.  Patient told her daughter she has not received her AM medications yet today.  Daughter reports she can give medicine to patient today.  I asked her to make sure patient had not received medications earlier today. Will discuss with provider in office regarding planned cardioversion

## 2022-10-22 NOTE — ED Notes (Signed)
Pt assisted to restroom with walk staff and walker. Pts daughter upset about home meds not being administered and having to come get pt to bring to facility due to not being able to go by PTAR. SW involved.

## 2022-10-22 NOTE — Progress Notes (Signed)
CSW contacted [facility] and patients authorization was approved. Plan authorization number is [id number].    CSW contacted Kia with [employer] and confirmed bed offer. Kia will review AVS.

## 2022-10-22 NOTE — ED Notes (Signed)
Report attempted to [employer].

## 2022-10-23 ENCOUNTER — Encounter (HOSPITAL_COMMUNITY): Payer: Self-pay | Admitting: Cardiology

## 2022-10-23 ENCOUNTER — Encounter (HOSPITAL_COMMUNITY)
Admission: RE | Disposition: A | Discharge status: Home / Self Care | Payer: Self-pay | Source: Ambulatory Visit | Attending: Cardiology

## 2022-10-23 ENCOUNTER — Ambulatory Visit (HOSPITAL_COMMUNITY): Payer: Medicare PPO | Admitting: Anesthesiology

## 2022-10-23 ENCOUNTER — Other Ambulatory Visit: Payer: Self-pay

## 2022-10-23 ENCOUNTER — Ambulatory Visit (HOSPITAL_COMMUNITY)
Admission: RE | Admit: 2022-10-23 | Discharge: 2022-10-23 | Disposition: A | Discharge status: Home / Self Care | Payer: Medicare PPO | Source: Ambulatory Visit | Attending: Cardiology | Admitting: Cardiology

## 2022-10-23 ENCOUNTER — Ambulatory Visit (HOSPITAL_BASED_OUTPATIENT_CLINIC_OR_DEPARTMENT_OTHER): Payer: Medicare PPO | Admitting: Anesthesiology

## 2022-10-23 DIAGNOSIS — E039 Hypothyroidism, unspecified: Secondary | ICD-10-CM

## 2022-10-23 DIAGNOSIS — Z952 Presence of prosthetic heart valve: Secondary | ICD-10-CM | POA: Insufficient documentation

## 2022-10-23 DIAGNOSIS — Z7901 Long term (current) use of anticoagulants: Secondary | ICD-10-CM | POA: Diagnosis not present

## 2022-10-23 DIAGNOSIS — G8929 Other chronic pain: Secondary | ICD-10-CM | POA: Diagnosis not present

## 2022-10-23 DIAGNOSIS — Z79899 Other long term (current) drug therapy: Secondary | ICD-10-CM | POA: Insufficient documentation

## 2022-10-23 DIAGNOSIS — E7800 Pure hypercholesterolemia, unspecified: Secondary | ICD-10-CM | POA: Insufficient documentation

## 2022-10-23 DIAGNOSIS — F32A Depression, unspecified: Secondary | ICD-10-CM | POA: Insufficient documentation

## 2022-10-23 DIAGNOSIS — I4819 Other persistent atrial fibrillation: Secondary | ICD-10-CM | POA: Insufficient documentation

## 2022-10-23 DIAGNOSIS — M549 Dorsalgia, unspecified: Secondary | ICD-10-CM | POA: Insufficient documentation

## 2022-10-23 DIAGNOSIS — G4733 Obstructive sleep apnea (adult) (pediatric): Secondary | ICD-10-CM | POA: Diagnosis not present

## 2022-10-23 DIAGNOSIS — Z8249 Family history of ischemic heart disease and other diseases of the circulatory system: Secondary | ICD-10-CM | POA: Insufficient documentation

## 2022-10-23 DIAGNOSIS — M1990 Unspecified osteoarthritis, unspecified site: Secondary | ICD-10-CM | POA: Insufficient documentation

## 2022-10-23 DIAGNOSIS — E119 Type 2 diabetes mellitus without complications: Secondary | ICD-10-CM | POA: Insufficient documentation

## 2022-10-23 DIAGNOSIS — F419 Anxiety disorder, unspecified: Secondary | ICD-10-CM | POA: Insufficient documentation

## 2022-10-23 DIAGNOSIS — G2581 Restless legs syndrome: Secondary | ICD-10-CM | POA: Insufficient documentation

## 2022-10-23 DIAGNOSIS — Z85820 Personal history of malignant melanoma of skin: Secondary | ICD-10-CM | POA: Diagnosis not present

## 2022-10-23 DIAGNOSIS — I110 Hypertensive heart disease with heart failure: Secondary | ICD-10-CM | POA: Insufficient documentation

## 2022-10-23 DIAGNOSIS — Z79890 Hormone replacement therapy (postmenopausal): Secondary | ICD-10-CM | POA: Insufficient documentation

## 2022-10-23 DIAGNOSIS — I428 Other cardiomyopathies: Secondary | ICD-10-CM | POA: Diagnosis not present

## 2022-10-23 DIAGNOSIS — I10 Essential (primary) hypertension: Secondary | ICD-10-CM | POA: Diagnosis not present

## 2022-10-23 DIAGNOSIS — I4891 Unspecified atrial fibrillation: Secondary | ICD-10-CM | POA: Diagnosis not present

## 2022-10-23 DIAGNOSIS — K219 Gastro-esophageal reflux disease without esophagitis: Secondary | ICD-10-CM | POA: Insufficient documentation

## 2022-10-23 DIAGNOSIS — I5022 Chronic systolic (congestive) heart failure: Secondary | ICD-10-CM | POA: Diagnosis not present

## 2022-10-23 HISTORY — PX: CARDIOVERSION: SHX1299

## 2022-10-23 LAB — GLUCOSE, CAPILLARY: Glucose-Capillary: 105 mg/dL — ABNORMAL HIGH (ref 70–99)

## 2022-10-23 SURGERY — CARDIOVERSION
Anesthesia: General

## 2022-10-23 MED ORDER — SODIUM CHLORIDE 0.9 % IV SOLN: INTRAVENOUS | Status: DC

## 2022-10-23 MED ORDER — LIDOCAINE 2% (20 MG/ML) 5 ML SYRINGE
INTRAMUSCULAR | Status: DC | PRN
  Administered 2022-10-23: 20 mg via INTRAVENOUS

## 2022-10-23 MED ORDER — PROPOFOL 10 MG/ML IV BOLUS
INTRAVENOUS | Status: DC | PRN
  Administered 2022-10-23: 60 mg via INTRAVENOUS
  Administered 2022-10-23: 20 mg via INTRAVENOUS

## 2022-10-23 NOTE — CV Procedure (Signed)
    Electrical Cardioversion Procedure Note NAHIARA KRETZSCHMAR [id number] 18-Nov-1940  Procedure: Electrical Cardioversion Indications:  Atrial Fibrillation  Time Out: Verified patient identification, verified procedure,medications/allergies/relevent history reviewed, required imaging and test results available.  Performed  Procedure Details  The patient was NPO after midnight. Anesthesia was administered at the beside  by Dr.Jackson with propofol.  Cardioversion was performed with synchronized biphasic defibrillation via AP pads with 200 joules.  2 attempt(s) were performed.  The patient converted to normal sinus rhythm. The patient tolerated the procedure well   IMPRESSION:  Successful cardioversion of atrial fibrillation after second shock. PAC's noted    Candee Furbish 10/23/2022, 11:21 AM

## 2022-10-23 NOTE — Anesthesia Postprocedure Evaluation (Signed)
Anesthesia Post Note  Patient: Jenna [name]  Procedure(s) Performed: CARDIOVERSION     Patient location during evaluation: Endoscopy Anesthesia Type: General Level of consciousness: awake and alert, patient cooperative and oriented Pain management: pain level controlled Vital Signs Assessment: post-procedure vital signs reviewed and stable Respiratory status: nonlabored ventilation, spontaneous breathing and respiratory function stable Cardiovascular status: blood pressure returned to baseline and stable Postop Assessment: no apparent nausea or vomiting and able to ambulate Anesthetic complications: no   No notable events documented.  Last Vitals:  Vitals:   10/23/22 1140 10/23/22 1148  BP: 137/78 131/64  Pulse: (!) 56 (!) 59  Resp: 20 14  Temp:    SpO2: 98% 97%    Last Pain:  Vitals:   10/23/22 1148  TempSrc:   PainSc: 0-No pain                 Hanna Ra,E. Taneka Espiritu

## 2022-10-23 NOTE — Interval H&P Note (Signed)
History and Physical Interval Note:  10/23/2022 10:50 AM  Jenna [name]  has presented today for surgery, with the diagnosis of AFIB.  The various methods of treatment have been discussed with the patient and family. After consideration of risks, benefits and other options for treatment, the patient has consented to  Procedure(s): CARDIOVERSION (N/A) as a surgical intervention.  The patient's history has been reviewed, patient examined, no change in status, stable for surgery.  I have reviewed the patient's chart and labs.  Questions were answered to the patient's satisfaction.     [employer]

## 2022-10-23 NOTE — Anesthesia Preprocedure Evaluation (Addendum)
Anesthesia Evaluation  Patient identified by MRN, date of birth, ID band Patient awake    Reviewed: Allergy & Precautions, NPO status , Patient's Chart, lab work & pertinent test results  History of Anesthesia Complications Negative for: history of anesthetic complications  Airway Mallampati: II  TM Distance: >3 FB Neck ROM: Full    Dental  (+) Caps, Dental Advisory Given   Pulmonary sleep apnea (doesn't use regularly) and Continuous Positive Airway Pressure Ventilation    breath sounds clear to auscultation       Cardiovascular hypertension, Pt. on medications and Pt. on home beta blockers (-) angina + dysrhythmias Atrial Fibrillation + Valvular Problems/Murmurs (s/p TAVR) AS  Rhythm:Irregular Rate:Normal  10/10/2022 ECHO: EF 50-55%. 1. The LV has low normal function, global hypokinesis. There is mild LVH. Left ventricular diastolic function could not be evaluated.   2. RVF is normal. The right ventricular size is normal. There is normal pulmonary artery systolic pressure.   3. Left atrial size was mildly dilated.   4. The mitral valve is normal in structure. Mild mitral valve regurgitation. No evidence of mitral stenosis.   5. The aortic valve has been repaired/replaced. Aortic valve regurgitation is not visualized. There is a 23 mm Sapien prosthetic (TAVR) valve present in the aortic position. Procedure Date: 10/30/2021. Echo findings are consistent with normal structure and function of the aortic valve prosthesis. Aortic valve mean gradient  measures 9.0 mmHg.     Neuro/Psych negative neurological ROS     GI/Hepatic Neg liver ROS,GERD (diet controlled, stopped omeprazole)  ,,  Endo/Other  diabetes (glu 105)Hypothyroidism    Renal/GU      Musculoskeletal  (+) Arthritis ,    Abdominal  (+) + obese  Peds  Hematology eliquis   Anesthesia Other Findings   Reproductive/Obstetrics                              Anesthesia Physical Anesthesia Plan  ASA: 3  Anesthesia Plan: General   Post-op Pain Management: Minimal or no pain anticipated   Induction:   PONV Risk Score and Plan: 4 or greater and Treatment may vary due to age or medical condition  Airway Management Planned: Natural Airway and Mask  Additional Equipment: None  Intra-op Plan:   Post-operative Plan:   Informed Consent: I have reviewed the patients History and Physical, chart, labs and discussed the procedure including the risks, benefits and alternatives for the proposed anesthesia with the patient or authorized representative who has indicated his/her understanding and acceptance.     Dental advisory given  Plan Discussed with: CRNA and Surgeon  Anesthesia Plan Comments:         Anesthesia Quick Evaluation

## 2022-10-23 NOTE — Transfer of Care (Signed)
Immediate Anesthesia Transfer of Care Note  Patient: Jenna [name]  Procedure(s) Performed: CARDIOVERSION  Patient Location: PACU and Endoscopy Unit  Anesthesia Type:General  Level of Consciousness: drowsy  Airway & Oxygen Therapy: Patient Spontanous Breathing  Post-op Assessment: Report given to RN and Post -op Vital signs reviewed and stable  Post vital signs: Reviewed  Last Vitals:  Vitals Value Taken Time  BP 106/69   Temp 97.9   Pulse 66   Resp 12   SpO2 98     Last Pain:  Vitals:   10/23/22 0946  TempSrc: Temporal  PainSc: 0-No pain         Complications: No notable events documented.

## 2022-10-23 NOTE — Discharge Instructions (Signed)
Electrical Cardioversion °Electrical cardioversion is the delivery of a jolt of electricity to restore a normal rhythm to the heart. A rhythm that is too fast or is not regular keeps the heart from pumping well. In this procedure, sticky patches or metal paddles are placed on the chest to deliver electricity to the heart from a device. °This procedure may be done in an emergency if: °There is low or no blood pressure as a result of the heart rhythm. °Normal rhythm must be restored as fast as possible to protect the brain and heart from further damage. °It may save a life. °This may also be a scheduled procedure for irregular or fast heart rhythms that are not immediately life-threatening. ° °What can I expect after the procedure? °Your blood pressure, heart rate, breathing rate, and blood oxygen level will be monitored until you leave the hospital or clinic. °Your heart rhythm will be watched to make sure it does not change. °You may have some redness on the skin where the shocks were given. Over the counter cortizone cream may be helpful.  °Follow these instructions at home: °Do not drive for 24 hours if you were given a sedative during your procedure. °Take over-the-counter and prescription medicines only as told by your health care provider. °Ask your health care provider how to check your pulse. Check it often. °Rest for 48 hours after the procedure or as told by your health care provider. °Avoid or limit your caffeine use as told by your health care provider. °Keep all follow-up visits as told by your health care provider. This is important. °Contact a health care provider if: °You feel like your heart is beating too quickly or your pulse is not regular. °You have a serious muscle cramp that does not go away. °Get help right away if: °You have discomfort in your chest. °You are dizzy or you feel faint. °You have trouble breathing or you are short of breath. °Your speech is slurred. °You have trouble moving an  arm or leg on one side of your body. °Your fingers or toes turn cold or blue. °Summary °Electrical cardioversion is the delivery of a jolt of electricity to restore a normal rhythm to the heart. °This procedure may be done right away in an emergency or may be a scheduled procedure if the condition is not an emergency. °Generally, this is a safe procedure. °After the procedure, check your pulse often as told by your health care provider. °This information is not intended to replace advice given to you by your health care provider. Make sure you discuss any questions you have with your health care provider. °Document Revised: 04/26/2019 Document Reviewed: 04/26/2019 °Elsevier Patient Education © 2020 [employer]  °

## 2022-10-24 NOTE — Progress Notes (Signed)
Carelink Summary Report / Loop Recorder

## 2022-10-26 ENCOUNTER — Encounter (HOSPITAL_COMMUNITY): Payer: Self-pay | Admitting: Cardiology

## 2022-11-06 ENCOUNTER — Telehealth: Payer: Self-pay

## 2022-11-06 NOTE — Telephone Encounter (Signed)
ILR nonbillable report received. Battery status OK. Normal device function. No new symptom, tachy, brady, or pause episodes. Numerous new AF episodes, there is not good ventricular rate control. Heart rates are 100-160 bpm. AF burden is 14.8% of the time.  On [place] according to previous reports.   Sent to triage for AF with RVR.    Appears patient went back in AF on 11/01/22/  Spoke with patient she did not know she had went back into AF, advised patient to keep taking her Eliquis, patient stated she is taking in consistently, offered patient sooner apt with AF clinic than  12/07/22, patient stated she would just keep this apt. Advised patient to call back if she wanted sooner apt.

## 2022-11-14 ENCOUNTER — Encounter (HOSPITAL_COMMUNITY): Payer: Self-pay | Admitting: *Deleted

## 2022-11-21 LAB — CUP PACEART REMOTE DEVICE CHECK
Date Time Interrogation Session: 20240210222219
Implantable Pulse Generator Implant Date: 20210322

## 2022-11-25 ENCOUNTER — Ambulatory Visit (INDEPENDENT_AMBULATORY_CARE_PROVIDER_SITE_OTHER): Payer: Medicare PPO

## 2022-11-25 DIAGNOSIS — I4819 Other persistent atrial fibrillation: Secondary | ICD-10-CM | POA: Diagnosis not present

## 2022-11-26 ENCOUNTER — Other Ambulatory Visit (HOSPITAL_COMMUNITY): Payer: Self-pay | Admitting: *Deleted

## 2022-11-26 MED ORDER — CARVEDILOL 12.5 MG PO TABS: 12.5000 mg | ORAL_TABLET | Freq: Two times a day (BID) | ORAL | 2 refills | Status: DC

## 2022-12-03 NOTE — Progress Notes (Signed)
[facility] Summary Report / Loop Recorder 

## 2022-12-11 ENCOUNTER — Ambulatory Visit (HOSPITAL_COMMUNITY)
Admission: RE | Admit: 2022-12-11 | Discharge: 2022-12-11 | Disposition: A | Discharge status: Home / Self Care | Payer: Medicare PPO | Source: Ambulatory Visit | Attending: Physician Assistant | Admitting: Physician Assistant

## 2022-12-11 VITALS — BP 126/82 | HR 108 | Ht 65.0 in | Wt 189.2 lb

## 2022-12-11 DIAGNOSIS — G4733 Obstructive sleep apnea (adult) (pediatric): Secondary | ICD-10-CM | POA: Diagnosis not present

## 2022-12-11 DIAGNOSIS — D6869 Other thrombophilia: Secondary | ICD-10-CM | POA: Diagnosis not present

## 2022-12-11 DIAGNOSIS — Z952 Presence of prosthetic heart valve: Secondary | ICD-10-CM | POA: Insufficient documentation

## 2022-12-11 DIAGNOSIS — I10 Essential (primary) hypertension: Secondary | ICD-10-CM | POA: Diagnosis not present

## 2022-12-11 DIAGNOSIS — I4819 Other persistent atrial fibrillation: Secondary | ICD-10-CM | POA: Insufficient documentation

## 2022-12-11 DIAGNOSIS — I428 Other cardiomyopathies: Secondary | ICD-10-CM | POA: Insufficient documentation

## 2022-12-11 DIAGNOSIS — Z7901 Long term (current) use of anticoagulants: Secondary | ICD-10-CM | POA: Insufficient documentation

## 2022-12-11 DIAGNOSIS — Z79899 Other long term (current) drug therapy: Secondary | ICD-10-CM | POA: Diagnosis not present

## 2022-12-11 NOTE — Progress Notes (Signed)
Primary Care Physician: Chesley Noon, MD Referring Physician: Dr. 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 female with a h/o afib ablation 01/10/2017 by Dr. Rayann Heman, in the [place] clinic for f/u.  She had become refractory to amiodarone and developed  TCM with multiple admissions for CHF.She has done well since the procedure. She had a recent Linq implanted and on first Paceart report late May, did not have any arrhythmia noted.  Continues on apixaban without missed doses for chadsvasc score of at least 5. She is due for her physical in June and is due to have screening labs done.  Follow up in the AF clinic 09/24/22. Patient and her daughter report that she stopped all of her medications during the summer and did not want to follow up with any doctors. She has slowly been resuming her medications since October. The device clinic received an alert for ongoing afib with RVR showing nearly 100% burden. Patient remains in rapid afib today. She never resumed her BB. Of note, her thyroid medication is also being adjusted by her PCP.   F/u in afib clinic, 10/02/22.  She was seen by R. Kabrea Seeney last week in afib and did resume BB as sordered,  but continues in afib today with RVR. She is here with her son-in-law and granddaughter today. She is tired and winded. She did miss 1-2 doses of anticoagulation first week of December. She did not want to pursue cardioversion last week when she was seen by [place]. Jenna [name] Today, she is still non committal to a CV. She is open to increase BB again today and schedule CV  in several weeks after appropriate amount of time on anticoagulation.    Follow up in the AF clinic 12/11/22. Patient is s/p DCCV on 10/23/22. Her ILR shows she only stayed in [place] for one week. She states she did feel better overall while in SR but could not name anything specific that felt improved. No bleeding issues on anticoagulation.   Today, she denies symptoms of palpitations, chest  pain, shortness of breath, orthopnea, PND, lower extremity edema, dizziness, presyncope, syncope, or neurologic sequela. The patient is tolerating medications without difficulties and is otherwise without complaint today.   Past Medical History:  Diagnosis Date   Anxiety    CHF (congestive heart failure) (HCC)    Chronic lower back pain    Chronic systolic dysfunction of left ventricle    Depression    Fatigue    GERD (gastroesophageal reflux disease)    High cholesterol    Hypertension    Hypothyroidism    Melanoma of shoulder, left (HCC)    Mild aortic stenosis    Nonischemic cardiomyopathy (HCC)    OSA on CPAP    reports compliance with CPAP   Osteoarthritis    Persistent atrial fibrillation with RVR ([place])    [name] 11/15/2016   PONV (postoperative nausea and vomiting)    RLS (restless legs syndrome)    S/P ablation of atrial flutter/ fib 01/10/17 01/11/2017   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 Angelena Form & Dr. Cyndia Bent   Type II diabetes mellitus ([place])    no medications   Past Surgical History:  Procedure Laterality Date   ATRIAL FIBRILLATION ABLATION N/A 01/10/2017   Procedure: Atrial Fibrillation Ablation;  Surgeon: Thompson Grayer, MD;  Location: [place] [facility];  Service: Cardiovascular;  Laterality: N/A;   CARDIOVERSION N/A 11/19/2016   Procedure: CARDIOVERSION;  Surgeon: Lelon Perla, MD;  Location: [place];  Service: Cardiovascular;  Laterality: N/A;   CARDIOVERSION N/A 12/11/2016   Procedure: CARDIOVERSION;  Surgeon: Dorothy Spark, MD;  Location: [place];  Service: Cardiovascular;  Laterality: N/A;   CARDIOVERSION N/A 12/18/2016   Procedure: CARDIOVERSION;  Surgeon: Skeet Latch, MD;  Location: [place];  Service: Cardiovascular;  Laterality: N/A;   CARDIOVERSION N/A 10/23/2022   Procedure: CARDIOVERSION;  Surgeon: Jerline Pain, MD;  Location: [facility];   Service: Cardiovascular;  Laterality: N/A;   CATARACT EXTRACTION W/ INTRAOCULAR LENS  IMPLANT, BILATERAL Bilateral    DILATION AND CURETTAGE OF UTERUS     implantable loop recorder placement  12/27/2019   Medtronic Reveal Linq model [id number] (SN RLA [id number]) implanted in office by Dr Rayann Heman   INTRAOPERATIVE TRANSTHORACIC ECHOCARDIOGRAM N/A 10/30/2021   Procedure: INTRAOPERATIVE TRANSTHORACIC ECHOCARDIOGRAM;  Surgeon: Burnell Blanks, MD;  Location: [place] [facility];  Service: Open Heart Surgery;  Laterality: N/A;   LEXISCAN MYOVIEW  05/13/2012   No ECG changes. EKG negative for ischemia. No significant ischemia demonstrated.   MELANOMA EXCISION Left ~ 09/1979   "back of my shoulder"   RIGHT/LEFT HEART CATH AND CORONARY ANGIOGRAPHY N/A 09/17/2021   Procedure: RIGHT/LEFT HEART CATH AND CORONARY ANGIOGRAPHY;  Surgeon: Burnell Blanks, MD;  Location: [place] [facility];  Service: Cardiovascular;  Laterality: N/A;   TEE WITHOUT CARDIOVERSION N/A 11/19/2016   Procedure: TRANSESOPHAGEAL ECHOCARDIOGRAM (TEE);  Surgeon: Lelon Perla, MD;  Location: [facility];  Service: Cardiovascular;  Laterality: N/A;  need CV, but no anesthesia available   TEE WITHOUT CARDIOVERSION N/A 01/10/2017   Procedure: TRANSESOPHAGEAL ECHOCARDIOGRAM (TEE);  Surgeon: [name], MD;  Location: [facility];  Service: Cardiovascular;  Laterality: N/A;   TOTAL KNEE ARTHROPLASTY Right 07/28/2018   Procedure: RIGHT TOTAL KNEE ARTHROPLASTY;  Surgeon: Mcarthur Rossetti, MD;  Location: [place];  Service: Orthopedics;  Laterality: Right;   TRANSCATHETER AORTIC VALVE REPLACEMENT, TRANSFEMORAL N/A 10/30/2021   Procedure: TRANSCATHETER AORTIC VALVE REPLACEMENT, TRANSFEMORAL;  Surgeon: Burnell Blanks, MD;  Location: [place] [facility];  Service: Open Heart Surgery;  Laterality: N/A;   TRANSTHORACIC ECHOCARDIOGRAM  02/18/2012   EF >55%, mild aortic stenosis   VAGINAL HYSTERECTOMY  1985    Current  Outpatient Medications  Medication Sig Dispense Refill   acetaminophen (TYLENOL) 500 MG tablet Take 1,000 mg by mouth every 6 (six) hours as needed for moderate pain.     amoxicillin (AMOXIL) 500 MG tablet Take 4 tablets by mouth 1 hour before dental procedures and cleanings 12 tablet 6   apixaban (ELIQUIS) 5 MG TABS tablet Take 1 tablet (5 mg total) by mouth 2 (two) times daily. 180 tablet 1   Calcium Carb-Cholecalciferol (CALCIUM 600 + D PO) Take 1 tablet by mouth 2 (two) times daily with a meal.     carvedilol (COREG) 12.5 MG tablet Take 1 tablet (12.5 mg total) by mouth 2 (two) times daily with a meal. 180 tablet 2   escitalopram (LEXAPRO) 10 MG tablet Take 10 mg by mouth daily.     levothyroxine (SYNTHROID) 75 MCG tablet Take 75 mcg by mouth daily before breakfast.     Multiple Vitamins-Minerals (PRESERVISION AREDS 2) CAPS Take 1 capsule by mouth 2 (two) times daily.     ONETOUCH ULTRA test strip      traMADol (ULTRAM) 50 MG tablet Take 1 tablet (50  mg total) by mouth every 8 (eight) hours as needed. 15 tablet 0   No current facility-administered medications for this encounter.    Allergies  Allergen Reactions   Rosuvastatin Other (See Comments)    Joints ached  Joints ached  Joints ached  Other reaction(s): joints ache   Amoxicillin Nausea Only    850 mg dosage  Other Reaction(s): GI Intolerance   Lovastatin Other (See Comments)    Joints hurt all over body     Social History   Socioeconomic History   Marital status: Widowed    Spouse name: Not on file   Number of children: 2   Years of education: Not on file   Highest education level: Not on file  Occupational History   Occupation: [employer]  Tobacco Use   Smoking status: Never   Smokeless tobacco: Never  Vaping Use   Vaping Use: Never used  Substance and Sexual Activity   Alcohol use: No   Drug use: Never   Sexual activity: Never  Other Topics Concern   Not on file  Social History  Narrative   Not on file   Social Determinants of Health   Financial Resource Strain: Not on file  Food Insecurity: Not on file  Transportation Needs: Not on file  Physical Activity: Not on file  Stress: Not on file  Social Connections: Not on file  Intimate Partner Violence: Not on file    Family History  Problem Relation Age of Onset   Hypertension Mother    Diabetes Mother    Heart failure Father    Arrhythmia Sister    Hyperlipidemia Sister    Hypertension Sister    Healthy Brother    Stroke Paternal Grandmother    Cancer Paternal Grandfather    Healthy Daughter    Hypertension Son    Hyperlipidemia Son     ROS- All systems are reviewed and negative except as per the HPI above  Physical Exam: Vitals:   12/11/22 1409  BP: 126/82  Pulse: (!) 108  Weight: 85.8 kg  Height: '5\' 5"'$  (1.651 m)   Wt Readings from Last 3 Encounters:  12/11/22 85.8 kg  10/23/22 84.8 kg  10/18/22 84.8 kg    Labs: Lab Results  Component Value Date   NA 140 10/18/2022   K 3.9 10/18/2022   CL 103 10/18/2022   CO2 30 10/18/2022   GLUCOSE 120 (H) 10/18/2022   BUN 23 10/18/2022   CREATININE 0.88 10/18/2022   CALCIUM 9.7 10/18/2022   MG 1.9 10/31/2021   Lab Results  Component Value Date   INR 1.9 (H) 10/18/2022   Lab Results  Component Value Date   CHOL 170 02/11/2017   HDL 82 02/11/2017   Ravalli 69 02/11/2017   TRIG 93 02/11/2017     GEN- The patient is a well appearing elderly female, alert and oriented x 3 today.   HEENT-head normocephalic, atraumatic, sclera clear, conjunctiva pink, hearing intact, trachea midline. Lungs- Clear to ausculation bilaterally, normal work of breathing Heart- irregular rate and rhythm, no murmurs, rubs or gallops  GI- soft, NT, ND, + BS Extremities- no clubbing, cyanosis, or edema MS- no significant deformity or atrophy Skin- no rash or lesion Psych- euthymic mood, full affect Neuro- strength and sensation are intact   EKG today  demonstrates Afib Vent. rate 108 BPM PR interval * ms QRS duration 90 ms QT/QTcB 326/436 ms   Echo 11/28/21  1. Left ventricular ejection fraction, by estimation, is  60 to 65%. The  left ventricle has normal function. The left ventricle has no regional  wall motion abnormalities. There is moderate concentric left ventricular  hypertrophy. Left ventricular diastolic parameters were normal.   2. Right ventricular systolic function is normal. The right ventricular  size is normal. There is normal pulmonary artery systolic pressure. The  estimated right ventricular systolic pressure is [id number] mmHg.   3. The mitral valve is normal in structure. No evidence of mitral valve  regurgitation. No evidence of mitral stenosis.   4. The aortic valve has been repaired/replaced. Aortic valve  regurgitation is not visualized. There is a 23 mm Sapien prosthetic (TAVR)  valve present in the aortic position. Procedure Date: 10/30/2021. Echo  findings are consistent with normal structure  and function of the aortic valve prosthesis. Aortic valve mean gradient  measures 10.0 mmHg.   5. The inferior vena cava is normal in size with greater than 50%  respiratory variability, suggesting right atrial pressure of 3 mmHg.   Comparison(s): No significant change from prior study.   Conclusion(s)/Recommendation(s): S/P TAVR. Gradient improved from 15 mmHg to 10 mmHg on current study. Valve area is different between studies, but notably the stroke volume is significantly different between studies.    Epic records reviewed   CHA2DS2-VASc Score = 6  The patient's score is based upon: CHF History: 1 HTN History: 1 Diabetes History: 1 Stroke History: 0 Vascular Disease History: 0 Age Score: 2 Gender Score: 1        ASSESSMENT AND PLAN: 1. Persistent Atrial Fibrillation (ICD10:  I48.19) The patient's CHA2DS2-VASc score is 6, indicating a 9.7% annual risk of stroke.   S/p ablation 2018 S/p DCCV 10/23/22  with early return of afib.  ILR shows 100% afib burden. We discussed rhythm control options today including AAD vs repeat ablation. She would need to change her Lexapro to a different antidepressant to start AAD. She is not sure she wants to pursue rhythm control. Will have her meet with Dr Quentin Ore to discuss again before she decides on a rate control strategy. She does have a history of NICM with her afib.  Continue Eliquis 5 mg BID  Continue carvedilol 12.5 mg BID  2. Secondary Hypercoagulable State (ICD10:  D68.69) The patient is at significant risk for stroke/thromboembolism based upon her CHA2DS2-VASc Score of 6.  Continue Apixaban (Eliquis).   3. NICM EF recovered with return of SR Appears euvolemic today.   4. OSA Encouraged compliance with CPAP therapy.   5. HTN Stable, no changes today.   6. Severe AS S/p TAVR 10/2021   Follow up with Dr Quentin Ore to discuss rate vs rhythm control.    [facility] [address] [place], [place] [address] [phone]

## 2022-12-20 ENCOUNTER — Ambulatory Visit: Payer: Medicare PPO

## 2022-12-20 DIAGNOSIS — I428 Other cardiomyopathies: Secondary | ICD-10-CM

## 2022-12-20 LAB — CUP PACEART REMOTE DEVICE CHECK
Date Time Interrogation Session: 20240314232649
Implantable Pulse Generator Implant Date: 20210322

## 2023-01-08 NOTE — Progress Notes (Signed)
[facility] Summary Report / Loop Recorder 

## 2023-01-20 NOTE — Progress Notes (Signed)
[facility] Summary Report / Loop Recorder 

## 2023-01-21 ENCOUNTER — Ambulatory Visit (INDEPENDENT_AMBULATORY_CARE_PROVIDER_SITE_OTHER): Payer: Medicare PPO

## 2023-01-21 DIAGNOSIS — I428 Other cardiomyopathies: Secondary | ICD-10-CM | POA: Diagnosis not present

## 2023-01-23 LAB — CUP PACEART REMOTE DEVICE CHECK
Date Time Interrogation Session: 20240416232721
Implantable Pulse Generator Implant Date: 20210322

## 2023-02-18 ENCOUNTER — Ambulatory Visit: Payer: Medicare PPO | Attending: Cardiology | Admitting: Cardiology

## 2023-02-18 ENCOUNTER — Encounter: Payer: Self-pay | Admitting: Cardiology

## 2023-02-18 VITALS — BP 130/84 | HR 62 | Ht 65.0 in | Wt 191.4 lb

## 2023-02-18 DIAGNOSIS — I10 Essential (primary) hypertension: Secondary | ICD-10-CM | POA: Diagnosis not present

## 2023-02-18 DIAGNOSIS — I4819 Other persistent atrial fibrillation: Secondary | ICD-10-CM | POA: Diagnosis not present

## 2023-02-18 NOTE — Progress Notes (Deleted)
  Electrophysiology Office Follow up Visit Note:    Date:  02/18/2023   ID:  Jenna [name], DOB 09-25-1941, MRN [id number]  PCP:  Eartha Inch, MD  [facility] Cardiologist:  Chrystie Nose, MD  [facility] Electrophysiologist:  Lanier Prude, MD    Interval History:    Jenna [name] is a 82 y.o. female who presents for a follow up visit.   I last saw the patient October 16, 2022.  She was previously followed by Dr. Johney Frame.  She had a prior catheter ablation in 2018.  At the appointment with me we restarted all of her home medicines that had been stopped for unclear reasons and scheduled follow-up with the A-fib clinic.  She had a cardioversion October 23, 2022.  She was seen by Clide Cliff on December 11, 2022.  She was back in atrial fibrillation at the time of that appointment.  She only stayed in sinus rhythm for about 1 week according to her loop recorder but did feel better when she was in normal rhythm.         Past medical, surgical, social and family history were reviewed.  ROS:   Please see the history of present illness.    All other systems reviewed and are negative.  EKGs/Labs/Other Studies Reviewed:    The following studies were reviewed today:    Physical Exam:    VS:  There were no vitals taken for this visit.    Wt Readings from Last 3 Encounters:  12/11/22 189 lb 3.2 oz (85.8 kg)  10/23/22 186 lb 15.2 oz (84.8 kg)  10/18/22 187 lb (84.8 kg)     GEN: *** Well nourished, well developed in no acute distress CARDIAC: ***Irregularly irregular, no murmurs, rubs, gallops RESPIRATORY:  Clear to auscultation without rales, wheezing or rhonchi       ASSESSMENT:    No diagnosis found. PLAN:    In order of problems listed above:  #Persistent atrial fibrillation On Eliquis for stroke prophylaxis 100% burden of atrial fibrillation based on her loop recorder recordings Discussed treatment options including antiarrhythmic drugs and catheter  ablation.  #Nonischemic cardiomyopathy #Severe aortic stenosis post TAVR EF 50 to 55% on the January 2024 echo Rhythm control indicated as above         Signed, Steffanie Dunn, MD, [facility], [facility] 02/18/2023 5:42 AM    Electrophysiology [facility]

## 2023-02-18 NOTE — Patient Instructions (Signed)
Medication Instructions:  Your physician recommends that you continue on your current medications as directed. Please refer to the Current Medication list given to you today.  *If you need a refill on your cardiac medications before your next appointment, please call your pharmacy*  Follow-Up: At [facility], you and your health needs are our priority.  As part of our continuing mission to provide you with exceptional heart care, we have created designated Provider Care Teams.  These Care Teams include your primary Cardiologist (physician) and Advanced Practice Providers (APPs -  Physician Assistants and Nurse Practitioners) who all work together to provide you with the care you need, when you need it.  Your next appointment:   6 months  Provider:   You will see one of the following Advanced Practice Providers on your designated Care Team:   Francis Dowse, [place] Casimiro Needle "Mardelle Matte" [place], [place] Sherie Don, NP   Other Instructions Tikosyn (Dofetilide) Hospital Admission   Prior to day of admission:  Check with drug insurance company for cost of drug to ensure affordability --- Dofetilide 500 mcg twice a day.  GoodRx is an option if insurance copay is unaffordable.    No Benadryl is allowed 3 days prior to admission.   Please ensure no missed doses of your anticoagulation (blood thinner) for 3 weeks prior to admission. If a dose is missed please notify our office immediately.   A pharmacist will review all your medications for potential interactions with Tikosyn. If any medication changes are needed prior to admission we will be in touch with you.   If any new medications are started AFTER your admission date is set with Radio producer. Please notify our office immediately so your medication list can be updated and reviewed by our pharmacist again.  On day of admission:  Tikosyn initiation requires a 3 night/4 day hospital stay with constant telemetry monitoring. You will have an EKG  after each dose of Tikosyn as well as daily lab draws.   If the drug does not convert you to normal rhythm a cardioversion after the 4th dose of Tikosyn.   Afib Clinic office visit on the morning of admission is needed for preliminary labs/ekg.   Time of admission is dependent on bed availability in the hospital. In some instances, you will be sent home until bed is available. Rarely admission can be delayed to the following day if hospital census prevents available beds.   You may bring personal belongings/clothing with you to the hospital. Please leave your suitcase in the car until you arrive in admissions.   Questions please call our office at [phone]

## 2023-02-18 NOTE — Progress Notes (Signed)
Electrophysiology Office Follow up Visit Note:    Date:  02/18/2023   ID:  PELIN ODOHERTY, DOB [birth year], MRN [id number]  PCP:  Eartha Inch, MD  [facility] Cardiologist:  Chrystie Nose, MD  [facility] Electrophysiologist:  Lanier Prude, MD    Interval History:    Jenna [name] is a 82 y.o. female who presents for a follow up visit.   I last saw the patient October 16, 2022.  She was previously followed by Dr. Johney Frame.  She had a prior catheter ablation in 2018.  At the appointment with me we restarted all of her home medicines that had been stopped for unclear reasons and scheduled follow-up with the A-fib clinic.  She had a cardioversion October 23, 2022.  She was seen by Clide Cliff on December 11, 2022.  She was back in atrial fibrillation at the time of that appointment.  She only stayed in sinus rhythm for about 1 week according to her loop recorder but did feel better when she was in normal rhythm.  Today, she is accompanied by a family member.  She states that she has been having some Afib episodes. During physical therapy, her heart rate is sometimes found to be elevated. Usually she has felt fatigued. However, she also mentions that she is not very active and her fatigue may be related to deconditioning. Lately she has been more sedentary.  She confirms that she was previously on amiodarone years ago, prior to her ablation with Dr. Johney Frame.  Next week she is scheduled to follow-up with her PCP.  She denies any chest pain, shortness of breath, or peripheral edema. No lightheadedness, headaches, syncope, orthopnea, or PND.     Past medical, surgical, social and family history were reviewed.  ROS:   Please see the history of present illness. (+) Fatigue    All other systems reviewed and are negative.  EKGs/Labs/Other Studies Reviewed:    The following studies were reviewed today:  Physical Exam:    VS:  BP 130/84   Pulse 62   Ht 5\' 5"  (1.651 m)   Wt 191 lb 6.4  oz (86.8 kg)   SpO2 98%   BMI 31.85 kg/m     Wt Readings from Last 3 Encounters:  02/18/23 191 lb 6.4 oz (86.8 kg)  12/11/22 189 lb 3.2 oz (85.8 kg)  10/23/22 186 lb 15.2 oz (84.8 kg)     GEN:  Elderly, no distress CARDIAC: Irregularly irregular, no murmurs, rubs, gallops. ILR pocket well healed RESPIRATORY:  Clear to auscultation without rales, wheezing or rhonchi       ASSESSMENT:    No diagnosis found. PLAN:    In order of problems listed above:  #Persistent atrial fibrillation On Eliquis for stroke prophylaxis 100% burden of atrial fibrillation based on her loop recorder recordings Discussed treatment options including antiarrhythmic drugs and catheter ablation. I do not think she is a good candidate for catheter ablation given her current functional status. I have recommended dofetilide loading. I discussed the procedure in detail including the risks and need for inpatient hospitalization. She would like some time to consider her options before finalizing a decision.  #Nonischemic cardiomyopathy #Severe aortic stenosis post TAVR EF 50 to 55% on the January 2024 echo Rhythm control indicated as above  Follow-up in 6 months with APP.  I,Mathew Stumpf,acting as a Neurosurgeon for Lanier Prude, MD.,have documented all relevant documentation on the behalf of Lanier Prude, MD,as directed by  Lanier Prude, MD while in the presence of Lanier Prude, MD.  I, Lanier Prude, MD, have reviewed all documentation for this visit. The documentation on 02/18/23 for the exam, diagnosis, procedures, and orders are all accurate and complete.   Signed, Steffanie Dunn, MD, [facility], [facility] 02/18/2023 4:48 PM    Electrophysiology [facility]

## 2023-02-24 ENCOUNTER — Ambulatory Visit (INDEPENDENT_AMBULATORY_CARE_PROVIDER_SITE_OTHER): Payer: Medicare PPO

## 2023-02-24 DIAGNOSIS — I4819 Other persistent atrial fibrillation: Secondary | ICD-10-CM

## 2023-02-24 LAB — CUP PACEART REMOTE DEVICE CHECK
Date Time Interrogation Session: 20240519232721
Implantable Pulse Generator Implant Date: 20210322

## 2023-02-24 NOTE — Progress Notes (Signed)
[facility] Summary Report / Loop Recorder 

## 2023-03-21 ENCOUNTER — Ambulatory Visit: Payer: Medicare PPO

## 2023-03-24 NOTE — Progress Notes (Signed)
[facility] Summary Report / Loop Recorder 

## 2023-03-31 ENCOUNTER — Ambulatory Visit (INDEPENDENT_AMBULATORY_CARE_PROVIDER_SITE_OTHER): Payer: Medicare PPO

## 2023-03-31 DIAGNOSIS — I4819 Other persistent atrial fibrillation: Secondary | ICD-10-CM

## 2023-03-31 LAB — CUP PACEART REMOTE DEVICE CHECK
Date Time Interrogation Session: 20240623231143
Implantable Pulse Generator Implant Date: 20210322

## 2023-04-18 ENCOUNTER — Other Ambulatory Visit (HOSPITAL_COMMUNITY): Payer: Self-pay | Admitting: Physician Assistant

## 2023-04-18 NOTE — Progress Notes (Signed)
[facility] Summary Report / Loop Recorder 

## 2023-05-05 ENCOUNTER — Ambulatory Visit: Payer: Medicare PPO

## 2023-05-05 DIAGNOSIS — I4819 Other persistent atrial fibrillation: Secondary | ICD-10-CM | POA: Diagnosis not present

## 2023-05-21 NOTE — Progress Notes (Signed)
[facility] Summary Report / Loop Recorder 

## 2023-06-04 ENCOUNTER — Ambulatory Visit (INDEPENDENT_AMBULATORY_CARE_PROVIDER_SITE_OTHER): Payer: Medicare PPO

## 2023-06-04 DIAGNOSIS — I428 Other cardiomyopathies: Secondary | ICD-10-CM | POA: Diagnosis not present

## 2023-06-05 LAB — CUP PACEART REMOTE DEVICE CHECK
Date Time Interrogation Session: 20240828231440
Implantable Pulse Generator Implant Date: 20210322

## 2023-06-13 NOTE — Progress Notes (Signed)
[facility] Summary Report / Loop Recorder 

## 2023-06-20 ENCOUNTER — Ambulatory Visit: Payer: Medicare PPO

## 2023-07-07 ENCOUNTER — Ambulatory Visit (INDEPENDENT_AMBULATORY_CARE_PROVIDER_SITE_OTHER): Payer: Medicare PPO

## 2023-07-07 DIAGNOSIS — I4819 Other persistent atrial fibrillation: Secondary | ICD-10-CM

## 2023-07-08 LAB — CUP PACEART REMOTE DEVICE CHECK
Date Time Interrogation Session: 20240930232015
Implantable Pulse Generator Implant Date: 20210322

## 2023-07-21 NOTE — Progress Notes (Signed)
[facility] Summary Report / Loop Recorder 

## 2023-08-11 ENCOUNTER — Ambulatory Visit (INDEPENDENT_AMBULATORY_CARE_PROVIDER_SITE_OTHER): Payer: Medicare PPO

## 2023-08-11 DIAGNOSIS — I4819 Other persistent atrial fibrillation: Secondary | ICD-10-CM | POA: Diagnosis not present

## 2023-08-11 LAB — CUP PACEART REMOTE DEVICE CHECK
Date Time Interrogation Session: 20241102231520
Implantable Pulse Generator Implant Date: 20210322

## 2023-08-18 ENCOUNTER — Telehealth: Payer: Self-pay

## 2023-08-18 NOTE — Telephone Encounter (Signed)
Left message requesting call back.  ILR @ RRT   ILR reached RRT:  08/17/2023 Patient called, discussed options to leave device in or explanted.  Patient would like to  Marked "I" in Paceart:  done Enter note in Paceart:  done Canceled future remotes: done Discontinued from website: done Entered in Speciality Comments: done  Advised if further questions arise to please call the device clinic at [phone].

## 2023-08-18 NOTE — Telephone Encounter (Signed)
Alert received from [employer]:  ILR alert for RRT reached 11/10 - route to triage

## 2023-08-19 NOTE — Telephone Encounter (Signed)
Call back received from Pt's daughter.  Daughter states Pt wants to leave ILR.  Does not want extracted.  They will bring equipment to upcoming appointment for device clinic to return.  Advised to keep upcoming appointment.  Pt will need to be followed by cardiology-need to determine if EP needs to follow vs. General cardiology.  No recalls or upcoming appointments for gen cards in Pt chart.

## 2023-08-21 ENCOUNTER — Encounter: Payer: Self-pay | Admitting: Physician Assistant

## 2023-08-21 ENCOUNTER — Ambulatory Visit: Payer: Medicare PPO | Attending: Physician Assistant | Admitting: Physician Assistant

## 2023-08-21 VITALS — BP 138/82 | HR 131 | Ht 65.5 in | Wt 186.4 lb

## 2023-08-21 DIAGNOSIS — D6869 Other thrombophilia: Secondary | ICD-10-CM | POA: Diagnosis not present

## 2023-08-21 DIAGNOSIS — Z4509 Encounter for adjustment and management of other cardiac device: Secondary | ICD-10-CM

## 2023-08-21 DIAGNOSIS — I4811 Longstanding persistent atrial fibrillation: Secondary | ICD-10-CM

## 2023-08-21 DIAGNOSIS — I10 Essential (primary) hypertension: Secondary | ICD-10-CM

## 2023-08-21 DIAGNOSIS — Z952 Presence of prosthetic heart valve: Secondary | ICD-10-CM | POA: Diagnosis not present

## 2023-08-21 MED ORDER — CARVEDILOL 25 MG PO TABS: 25.0000 mg | ORAL_TABLET | Freq: Two times a day (BID) | ORAL | 3 refills | Status: DC

## 2023-08-21 NOTE — Progress Notes (Signed)
Cardiology Office Note:  .   Date:  08/21/2023  ID:  Jenna [name], DOB 14-Feb-1941, MRN [id number] PCP: Jenna Inch, MD  [facility] Providers Cardiologist:  Jenna Nose, MD Electrophysiologist:  Jenna Prude, MD {  History of Present Illness: .   Jenna [name] is a 82 y.o. 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 Lalla [name] 02/18/23, 100% AFib, not felt an ablation candidate.  The pt reported remotely having been on amiodarone. Discussed Tikosyn as an option, the pt wanting to give it some thought  Device clinic alerted for ILR at RRT  Today's visit is scheduled as a 6 mo visit  ROS:   She is accompanied by her daughter who mentions that she has been struggling some with depression now a couple years, very sedentary lifestyle, does some things, but really not much, not out of the house much The patient reports she feels like Afib makes her more tired, less energy Occasionally aware of a slight flutter No CP, no SOB, no DOE with her current level of exertion/lifestyle   Device information MDT LINQ II implanted 12/27/19 (AF surveillance)  Arrhythmia/AAD hx PVI ablation 01/10/2017 (Dr. Johney [name])    Studies Reviewed: [name]    EKG done today and reviewed by myself:  AFib 131bpm, LAD  DEVICE interrogation done today and reviewed by myself Battery is RRT 100% AFib, avg rates look 90's-100s,     10/09/22: TTE 1. Left ventricular ejection fraction, by estimation, is 50 to 55%. The  left ventricle has low normal function. The left ventricle demonstrates  global hypokinesis. There is mild left ventricular hypertrophy. Left  ventricular diastolic function could not   be evaluated.   2. Right ventricular  systolic function is normal. The right ventricular  size is normal. There is normal pulmonary artery systolic pressure.   3. Left atrial size was mildly dilated.   4. The mitral valve is normal in structure. Mild mitral valve  regurgitation. No evidence of mitral stenosis.   5. The aortic valve has been repaired/replaced. Aortic valve  regurgitation is not visualized. There is a 23 mm Sapien prosthetic (TAVR)  valve present in the aortic position. Procedure Date: 10/30/2021. Echo  findings are consistent with normal structure  and function of the aortic valve prosthesis. Aortic valve mean gradient  measures 9.0 mmHg.   6. The inferior vena cava is normal in size with <50% respiratory  variability, suggesting right atrial pressure of 8 mmHg.   Comparison(s): Changes from prior study are noted. Significant differences  in stroke volume between the last 3 echoes, which likely explains  differences in AVA. Echo 10/31/21 showed SV 127 ml, AVA 2.44 cm2; echo  11/28/21 showed SV 71 ml, AVA 1.33 cm2.  Current SV 29 ml, AVA 0.76 cm2.   Conclusion(s)/Recommendation(s): Patient in atrial fibrillation with RVR  during current study. Consider repeating study in sinus rhythm or with  better rate control if clinically indicated.    Echo 11/28/21  1. Left ventricular ejection fraction, by estimation, is 60 to 65%. The  left ventricle has normal function. The left ventricle has no regional  wall motion abnormalities. There is moderate concentric left ventricular  hypertrophy.  Left ventricular diastolic parameters were normal.   2. Right ventricular systolic function is normal. The right ventricular  size is normal. There is normal pulmonary artery systolic pressure. The  estimated right ventricular systolic pressure is 21.5 mmHg.   3. The mitral valve is normal in structure. No evidence of mitral valve  regurgitation. No evidence of mitral stenosis.   4. The aortic valve has been repaired/replaced.  Aortic valve  regurgitation is not visualized. There is a 23 mm Sapien prosthetic (TAVR)  valve present in the aortic position. Procedure Date: 10/30/2021. Echo  findings are consistent with normal structure  and function of the aortic valve prosthesis. Aortic valve mean gradient  measures 10.0 mmHg.   5. The inferior vena cava is normal in size with greater than 50%  respiratory variability, suggesting right atrial pressure of 3 mmHg.     Risk Assessment/Calculations:    Physical Exam:   VS:  There were no vitals taken for this visit.   Wt Readings from Last 3 Encounters:  02/18/23 191 lb 6.4 oz (86.8 kg)  12/11/22 189 lb 3.2 oz (85.8 kg)  10/23/22 186 lb 15.2 oz (84.8 kg)    GEN: Well nourished, well developed in no acute distress NECK: No JVD; No carotid bruits CARDIAC: irreg-irreg, no murmurs, rubs, gallops RESPIRATORY:  CTA b/l without rales, wheezing or rhonchi  ABDOMEN: Soft, non-tender, non-distended EXTREMITIES:  trace edema; No deformity   ILR site: is stable, no thinning, fluctuation, tethering  ASSESSMENT AND PLAN: .    Persistent AFib CHA2DS2Vasc is 6, on eliquis, appropriately dosed 100% burden Rate could be better  Hard to know how symptomatic she is She is not enthusiastic about Tikosyn/hospital stay Her daughter not convinced of success/durability of rhythm control, I am in agreement at this juncture Though by record felt to have had CM felt 2/2 her AF or tachy-mediated  I think we need to get better rate control at a minimum She is agreeable to increase her coreg dose Discussed getting an echo done, though she seemed reluctant Plan to have her back after the holidays, rethink/discuss echo then  NICM Felt to have been tachy-mediated Recovered LVEF No exam findings of volume OL Plan as above  VHD, bicuspid AV/AS H/o TAVR C/w Dr. Clifton [name]  HTN Looks ok, should tolerate some more coreg  Secondary hypercoagulable state 2/2 AFib     Dispo:  as above  Signed, Sheilah Pigeon, PA-C

## 2023-08-21 NOTE — Patient Instructions (Signed)
Medication Instructions:  Increase Carvedilol (Coreg) 25 mg twice a day   *If you need a refill on your cardiac medications before your next appointment, please call your pharmacy*   Lab Work: None ordered   If you have labs (blood work) drawn today and your tests are completely normal, you will receive your results only by: MyChart Message (if you have MyChart) OR A paper copy in the mail If you have any lab test that is abnormal or we need to change your treatment, we will call you to review the results.   Testing/Procedures: None ordered   Follow-Up: Follow up as scheduled   Other Instructions

## 2023-09-02 NOTE — Progress Notes (Signed)
[facility] Summary Report / Loop Recorder 

## 2023-09-19 ENCOUNTER — Ambulatory Visit: Payer: Medicare PPO

## 2023-10-16 ENCOUNTER — Other Ambulatory Visit (HOSPITAL_COMMUNITY): Payer: Self-pay | Admitting: Physician Assistant

## 2023-10-16 DIAGNOSIS — I4811 Longstanding persistent atrial fibrillation: Secondary | ICD-10-CM

## 2023-10-16 NOTE — Telephone Encounter (Signed)
 Prescription refill request for Eliquis received. Indication: Afib Last office visit: 08/21/23 Keitha Butte)  Scr: 0.88 (10/18/22)  Age: 83 Weight: 84.6kg  Appropriate dose. Refill sent.

## 2023-11-01 NOTE — Progress Notes (Unsigned)
Cardiology Office Note:  .   Date:  11/01/2023  ID:  Jenna [name], DOB 21-Sep-1941, MRN [id number] PCP: Jenna Inch, MD  [facility] Providers Cardiologist:  Jenna Nose, MD Electrophysiologist:  Jenna Prude, MD {  History of Present Illness: .   Jenna [name] is a 83 y.o. 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 Lalla Brothers 02/18/23, 100% AFib, not felt an ablation candidate.  The pt reported remotely having been on amiodarone. Discussed Tikosyn as an option, the pt wanting to give it some thought  Device clinic alerted for ILR at [facility]  I saw her 08/21/23 She is accompanied by her daughter who mentions that she has been struggling some with depression now a couple years, very sedentary lifestyle, does some things, but really not much, not out of the house much The patient reports she feels like Afib makes her more tired, less energy Occasionally aware of a slight flutter No CP, no SOB, no DOE with her current level of exertion/lifestyle Hard to know how symptomatic she is She was not enthusiastic about Tikosyn/hospital stay Her daughter not convinced of success/durability of rhythm control, I was in agreement at this juncture Though by record felt to have had CM felt 2/2 her AF or tachy-mediated Discussed better rate control at a minimum She was agreeable to increase her coreg dose Discussed getting an echo done, though she was reluctant Planned to have her back after the holidays, rethink/discuss echo then  Today's visit is scheduled as a 2-3 mo visit  ROS:   She is again with her daughter The pt denies any symptoms or concerns, defers much to he daughter. No CP, palpitations or cardiac awareness No  rest SOB, no DOE with the amount of activity that she does No near syncope or syncope   Device information MDT LINQ II implanted 12/27/19 (AF surveillance) RRT Nov 2024  Arrhythmia/AAD hx PVI ablation 01/10/2017 (Dr. Johney Frame)    Studies Reviewed: [name]    EKG not done today  DEVICE interrogation done today and reviewed by myself Battery is RRT 100% % AFib, avg rates look better, 60's-100's, mostly 80's or so    10/09/22: TTE 1. Left ventricular ejection fraction, by estimation, is 50 to 55%. The  left ventricle has low normal function. The left ventricle demonstrates  global hypokinesis. There is mild left ventricular hypertrophy. Left  ventricular diastolic function could not   be evaluated.   2. Right ventricular systolic function is normal. The right ventricular  size is normal. There is normal pulmonary artery systolic pressure.   3. Left atrial size was mildly dilated.   4. The mitral valve is normal in structure. Mild mitral valve  regurgitation. No evidence of mitral stenosis.   5. The aortic valve has been repaired/replaced. Aortic valve  regurgitation is not visualized. There is a 23 mm Sapien prosthetic (TAVR)  valve present in the aortic position. Procedure Date: 10/30/2021. Echo  findings are consistent with normal structure  and function of the aortic valve prosthesis. Aortic valve mean gradient  measures 9.0 mmHg.   6. The inferior vena cava is normal in size with <50% respiratory  variability, suggesting right atrial pressure  of 8 mmHg.   Comparison(s): Changes from prior study are noted. Significant differences  in stroke volume between the last 3 echoes, which likely explains  differences in AVA. Echo 10/31/21 showed SV 127 ml, AVA 2.44 cm2; echo  11/28/21 showed SV 71 ml, AVA 1.33 cm2.  Current SV 29 ml, AVA 0.76 cm2.   Conclusion(s)/Recommendation(s): Patient in atrial fibrillation with RVR  during current study. Consider repeating study in sinus rhythm or  with  better rate control if clinically indicated.    Echo 11/28/21  1. Left ventricular ejection fraction, by estimation, is 60 to 65%. The  left ventricle has normal function. The left ventricle has no regional  wall motion abnormalities. There is moderate concentric left ventricular  hypertrophy. Left ventricular diastolic parameters were normal.   2. Right ventricular systolic function is normal. The right ventricular  size is normal. There is normal pulmonary artery systolic pressure. The  estimated right ventricular systolic pressure is 21.5 mmHg.   3. The mitral valve is normal in structure. No evidence of mitral valve  regurgitation. No evidence of mitral stenosis.   4. The aortic valve has been repaired/replaced. Aortic valve  regurgitation is not visualized. There is a 23 mm Sapien prosthetic (TAVR)  valve present in the aortic position. Procedure Date: 10/30/2021. Echo  findings are consistent with normal structure  and function of the aortic valve prosthesis. Aortic valve mean gradient  measures 10.0 mmHg.   5. The inferior vena cava is normal in size with greater than 50%  respiratory variability, suggesting right atrial pressure of 3 mmHg.     Risk Assessment/Calculations:    Physical Exam:   VS:  There were no vitals taken for this visit.   Wt Readings from Last 3 Encounters:  08/21/23 186 lb 6.4 oz (84.6 kg)  02/18/23 191 lb 6.4 oz (86.8 kg)  12/11/22 189 lb 3.2 oz (85.8 kg)    GEN: Well nourished, well developed in no acute distress NECK: No JVD; No carotid bruits CARDIAC: irreg-irreg, no murmurs, rubs, gallops RESPIRATORY: CTA b/l without rales, wheezing or rhonchi  ABDOMEN: Soft, non-tender, non-distended EXTREMITIES: trace edema; No deformity   ILR site: is stable, no thinning, fluctuation, tethering  ASSESSMENT AND PLAN: .    Longstanding Persistent AFib CHA2DS2Vasc is 6, on eliquis,  appropriately dosed by last available labs 100%  burden Asymptomatic and rate controlled In d/w pt/daughter at out last visit and confirmed today, no plans for rhythm control Labs today  ILR is EOS, we were able to interrogate today She does not want the ILR removed    NICM Felt to have been tachy-mediated Recovered LVEF No exam findings of volume OL   VHD, bicuspid AV/AS H/o TAVR No murmur appreciated on exam C/w Dr. 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

## 2023-11-04 ENCOUNTER — Ambulatory Visit: Payer: Medicare PPO | Attending: Physician Assistant | Admitting: Physician Assistant

## 2023-11-04 VITALS — BP 136/80 | HR 68 | Ht 65.5 in | Wt 190.0 lb

## 2023-11-04 DIAGNOSIS — Z952 Presence of prosthetic heart valve: Secondary | ICD-10-CM | POA: Diagnosis not present

## 2023-11-04 DIAGNOSIS — I428 Other cardiomyopathies: Secondary | ICD-10-CM | POA: Diagnosis not present

## 2023-11-04 DIAGNOSIS — I10 Essential (primary) hypertension: Secondary | ICD-10-CM

## 2023-11-04 DIAGNOSIS — D6869 Other thrombophilia: Secondary | ICD-10-CM

## 2023-11-04 DIAGNOSIS — Z79899 Other long term (current) drug therapy: Secondary | ICD-10-CM | POA: Diagnosis not present

## 2023-11-04 DIAGNOSIS — I4811 Longstanding persistent atrial fibrillation: Secondary | ICD-10-CM | POA: Diagnosis not present

## 2023-11-04 NOTE — Patient Instructions (Addendum)
Medication Instructions:   Your physician recommends that you continue on your current medications as directed. Please refer to the Current Medication list given to you today.  *If you need a refill on your cardiac medications before your next appointment, please call your pharmacy*   Lab Work:   PLEASE GO DOWN STAIRS  [facility]  FIRST FLOOR  [address] :  BMET AND CBC TODAY     If you have labs (blood work) drawn today and your tests are completely normal, you will receive your results only by: MyChart Message (if you have MyChart) OR A paper copy in the mail If you have any lab test that is abnormal or we need to change your treatment, we will call you to review the results.   Testing/Procedures: NONE ORDERED  TODAY   Follow-Up: At [facility], you and your health needs are our priority.  As part of our continuing mission to provide you with exceptional heart care, we have created designated Provider Care Teams.  These Care Teams include your primary Cardiologist (physician) and Advanced Practice Providers (APPs -  Physician Assistants and Nurse Practitioners) who all work together to provide you with the care you need, when you need it.  We recommend signing up for the patient portal called "MyChart".  Sign up information is provided on this After Visit Summary.  MyChart is used to connect with patients for Virtual Visits (Telemedicine).  Patients are able to view lab/test results, encounter notes, upcoming appointments, etc.  Non-urgent messages can be sent to your provider as well.   To learn more about what you can do with MyChart, go to [email or web address].    Your next appointment:   6 month(s)  Provider:   You may see Lanier Prude, MD or one of the following Advanced Practice Providers on your designated Care Team:   Francis Dowse, [place]  Other Instructions

## 2023-11-05 LAB — CBC
Hematocrit: 42.1 % (ref 34.0–46.6)
Hemoglobin: 13.9 g/dL (ref 11.1–15.9)
MCH: 30.9 pg (ref 26.6–33.0)
MCHC: 33 g/dL (ref 31.5–35.7)
MCV: 94 fL (ref 79–97)
Platelets: 258 10*3/uL (ref 150–450)
RBC: 4.5 x10E6/uL (ref 3.77–5.28)
RDW: 11.8 % (ref 11.7–15.4)
WBC: 6.6 10*3/uL (ref 3.4–10.8)

## 2023-11-05 LAB — BASIC METABOLIC PANEL
BUN/Creatinine Ratio: 23 (ref 12–28)
BUN: 22 mg/dL (ref 8–27)
CO2: 28 mmol/L (ref 20–29)
Calcium: 10.2 mg/dL (ref 8.7–10.3)
Chloride: 102 mmol/L (ref 96–106)
Creatinine, Ser: 0.94 mg/dL (ref 0.57–1.00)
Glucose: 96 mg/dL (ref 70–99)
Potassium: 4.6 mmol/L (ref 3.5–5.2)
Sodium: 141 mmol/L (ref 134–144)
eGFR: 61 mL/min/{1.73_m2} (ref >59)

## 2023-12-19 ENCOUNTER — Ambulatory Visit: Payer: Medicare PPO

## 2024-01-16 ENCOUNTER — Other Ambulatory Visit (HOSPITAL_COMMUNITY): Payer: Self-pay | Admitting: Internal Medicine

## 2024-01-16 DIAGNOSIS — I4811 Longstanding persistent atrial fibrillation: Secondary | ICD-10-CM

## 2024-01-16 NOTE — Telephone Encounter (Signed)
 Eliquis 5mg  refill request received. Patient is 83 years old, weight-86.2kg, Crea- 0.94 on 11/04/23, Diagnosis-Afib, and last seen by Francis Dowse on 11/04/23. Dose is appropriate based on dosing criteria.   Refill was sent today with requested fill and to requested pharmacy. This request states alternative requested as not covered by insurance. Will send to pharmd for evaluation

## 2024-01-16 NOTE — Telephone Encounter (Signed)
 Prescription refill request for Eliquis received. Indication:afib Last office visit:1/25 Scr:0.94  1/25 Age: 83 Weight:86.2  kg  Prescription refilled

## 2024-01-19 ENCOUNTER — Encounter (HOSPITAL_BASED_OUTPATIENT_CLINIC_OR_DEPARTMENT_OTHER): Payer: Self-pay | Admitting: Internal Medicine

## 2024-01-19 ENCOUNTER — Telehealth: Payer: Self-pay | Admitting: Internal Medicine

## 2024-01-19 ENCOUNTER — Telehealth: Payer: Self-pay | Admitting: Pharmacy Technician

## 2024-01-19 ENCOUNTER — Other Ambulatory Visit (HOSPITAL_COMMUNITY): Payer: Self-pay

## 2024-01-19 NOTE — Telephone Encounter (Signed)
 Pharmacy Patient Advocate Encounter  Received notification from [employer] that Prior Authorization for Eliquis has been APPROVED from 10/08/23 to 10/06/24. Ran test claim, Copay is $80.00- 3 months. This test claim was processed through [facility]- copay amounts may vary at other pharmacies due to pharmacy/plan contracts, or as the patient moves through the different stages of their insurance plan.   PA #/Case ID/Reference #: [id number]

## 2024-01-19 NOTE — Telephone Encounter (Signed)
 Pharmacy Patient Advocate Encounter   Received notification from Pt Calls Messages that prior authorization for Eliquis is required/requested.   Insurance verification completed.   The patient is insured through [employer] .   Per test claim: PA required; PA submitted to above mentioned insurance via CoverMyMeds Key/confirmation #/EOC [id number] Status is pending

## 2024-01-19 NOTE — Telephone Encounter (Signed)
 Pt c/o medication issue:  1. Name of Medication: ELIQUIS 5 MG TABS tablet   2. How are you currently taking this medication (dosage and times per day)? As written   3. Are you having a reaction (difficulty breathing--STAT)? No   4. What is your medication issue? Pt daughter called in stating this med needs PA, please

## 2024-01-28 ENCOUNTER — Other Ambulatory Visit (HOSPITAL_COMMUNITY): Payer: Self-pay

## 2024-03-19 ENCOUNTER — Ambulatory Visit: Payer: Medicare PPO

## 2024-06-18 ENCOUNTER — Ambulatory Visit: Payer: Medicare PPO

## 2024-07-09 ENCOUNTER — Other Ambulatory Visit (HOSPITAL_BASED_OUTPATIENT_CLINIC_OR_DEPARTMENT_OTHER): Payer: Self-pay | Admitting: Internal Medicine

## 2024-07-09 DIAGNOSIS — I4811 Longstanding persistent atrial fibrillation: Secondary | ICD-10-CM

## 2024-07-09 NOTE — Telephone Encounter (Signed)
 Prescription refill request for Eliquis  received. Indication:AFIB Last office visit:1/25 Scr:0.94  1/25 Age: 83 Weight:86.2  kg  Prescription refilled

## 2024-08-10 ENCOUNTER — Other Ambulatory Visit: Payer: Self-pay | Admitting: Physician Assistant

## 2024-09-17 ENCOUNTER — Ambulatory Visit: Payer: Medicare PPO

## 2024-09-21 ENCOUNTER — Telehealth: Payer: Self-pay | Admitting: Internal Medicine

## 2024-09-21 ENCOUNTER — Emergency Department (HOSPITAL_COMMUNITY)
Admission: EM | Admit: 2024-09-21 | Discharge: 2024-09-22 | Disposition: A | Discharge status: Home / Self Care | Attending: Emergency Medicine | Admitting: Emergency Medicine

## 2024-09-21 ENCOUNTER — Encounter (HOSPITAL_COMMUNITY): Payer: Self-pay

## 2024-09-21 ENCOUNTER — Emergency Department (HOSPITAL_COMMUNITY)

## 2024-09-21 ENCOUNTER — Other Ambulatory Visit: Payer: Self-pay

## 2024-09-21 DIAGNOSIS — I509 Heart failure, unspecified: Secondary | ICD-10-CM | POA: Insufficient documentation

## 2024-09-21 DIAGNOSIS — E119 Type 2 diabetes mellitus without complications: Secondary | ICD-10-CM | POA: Diagnosis not present

## 2024-09-21 DIAGNOSIS — M7989 Other specified soft tissue disorders: Secondary | ICD-10-CM | POA: Insufficient documentation

## 2024-09-21 DIAGNOSIS — Z7901 Long term (current) use of anticoagulants: Secondary | ICD-10-CM | POA: Diagnosis not present

## 2024-09-21 DIAGNOSIS — I4891 Unspecified atrial fibrillation: Secondary | ICD-10-CM | POA: Insufficient documentation

## 2024-09-21 DIAGNOSIS — Z79899 Other long term (current) drug therapy: Secondary | ICD-10-CM | POA: Diagnosis not present

## 2024-09-21 DIAGNOSIS — I110 Hypertensive heart disease with heart failure: Secondary | ICD-10-CM | POA: Insufficient documentation

## 2024-09-21 LAB — TROPONIN T, HIGH SENSITIVITY: Troponin T High Sensitivity: 17 ng/L (ref 0–19)

## 2024-09-21 LAB — BASIC METABOLIC PANEL WITH GFR
Anion gap: 10 (ref 5–15)
BUN: 24 mg/dL — ABNORMAL HIGH (ref 8–23)
CO2: 27 mmol/L (ref 22–32)
Calcium: 10.2 mg/dL (ref 8.9–10.3)
Chloride: 100 mmol/L (ref 98–111)
Creatinine, Ser: 0.9 mg/dL (ref 0.44–1.00)
GFR, Estimated: >60 mL/min (ref >60)
Glucose, Bld: 109 mg/dL — ABNORMAL HIGH (ref 70–99)
Potassium: 4.9 mmol/L (ref 3.5–5.1)
Sodium: 138 mmol/L (ref 135–145)

## 2024-09-21 LAB — CBC
HCT: 42.7 % (ref 36.0–46.0)
Hemoglobin: 13.9 g/dL (ref 12.0–15.0)
MCH: 30.6 pg (ref 26.0–34.0)
MCHC: 32.6 g/dL (ref 30.0–36.0)
MCV: 94.1 fL (ref 80.0–100.0)
Platelets: 276 K/uL (ref 150–400)
RBC: 4.54 MIL/uL (ref 3.87–5.11)
RDW: 12.5 % (ref 11.5–15.5)
WBC: 6.4 K/uL (ref 4.0–10.5)
nRBC: 0 % (ref 0.0–0.2)

## 2024-09-21 LAB — PRO BRAIN NATRIURETIC PEPTIDE: Pro Brain Natriuretic Peptide: 2052 pg/mL — ABNORMAL HIGH (ref <300.0)

## 2024-09-21 NOTE — ED Triage Notes (Signed)
 Quick triage note: Pt to ED from PCP C/O Bilat leg swelling, unknown when this started. Reports SHOB X 2 weeks, Denies CP. hx a fib, takes Eliquis 

## 2024-09-21 NOTE — Telephone Encounter (Signed)
 Sent message  to Dr Mona for his information    [facility] cardmaster ETTER [place]) is aware as well

## 2024-09-21 NOTE — Telephone Encounter (Signed)
 Office called to let us  know they have sent the patient to the hospital because afib with sob and edema. Please advise

## 2024-09-21 NOTE — ED Provider Triage Note (Signed)
 Emergency Medicine Provider Triage Evaluation Note  Jenna [name] , a 83 y.o. female  was evaluated in triage.  Pt complains of multiple complaints.  Patient notes several weeks of worsening shortness of breath as well as lower extremity edema, patient reports a history of A-fib but is unable to tell me if she is in persistent A-fib or if it is more paroxysmal, she denies feeling palpitations and reports that she is compliant with her Eliquis .  Patient was seen today at her PCP and was sent to the emergency department due to A-fib with concerns for CHF.  Denies chest pain.  Review of Systems  Positive: As above Negative: As above  Physical Exam  BP (!) 164/99   Pulse 98   Temp 98.1 F (36.7 C)   Resp 18   Ht 5' 6 (1.676 m)   Wt 93.9 kg   SpO2 99%   BMI 33.41 kg/m  Gen:   Awake, no distress   Resp:  Normal effort  MSK:   Moves extremities without difficulty  Other:  Nonpitting edema of bilateral lower extremities, irregularly irregular rhythm consistent with afib  Medical Decision Making  Medically screening exam initiated at 5:37 PM.  Appropriate orders placed.  DESTENI PISCOPO was informed that the remainder of the evaluation will be completed by another provider, this initial triage assessment does not replace that evaluation, and the importance of remaining in the ED until their evaluation is complete.     Glendia Rocky SAILOR, [place] 09/21/24 1740

## 2024-09-22 LAB — URINALYSIS, ROUTINE W REFLEX MICROSCOPIC
Bilirubin Urine: NEGATIVE
Glucose, UA: NEGATIVE mg/dL
Ketones, ur: NEGATIVE mg/dL
Nitrite: POSITIVE — AB
Protein, ur: NEGATIVE mg/dL
Specific Gravity, Urine: 1.015 (ref 1.005–1.030)
pH: 6.5 (ref 5.0–8.0)

## 2024-09-22 LAB — URINALYSIS, MICROSCOPIC (REFLEX): WBC, UA: >50 WBC/hpf (ref 0–5)

## 2024-09-22 MED ORDER — FUROSEMIDE 20 MG PO TABS
20.0000 mg | ORAL_TABLET | Freq: Once | ORAL | Status: AC
  Administered 2024-09-22: 11:00:00 20 mg via ORAL
  Filled 2024-09-22: qty 1

## 2024-09-22 MED ORDER — FUROSEMIDE 20 MG PO TABS: 20.0000 mg | ORAL_TABLET | Freq: Every day | ORAL | 0 refills | Status: DC

## 2024-09-22 MED ORDER — CEPHALEXIN 500 MG PO CAPS: 500.0000 mg | ORAL_CAPSULE | Freq: Two times a day (BID) | ORAL | 0 refills | Status: DC

## 2024-09-22 MED ORDER — CARVEDILOL 12.5 MG PO TABS
25.0000 mg | ORAL_TABLET | Freq: Two times a day (BID) | ORAL | Status: DC
  Filled 2024-09-22: qty 2

## 2024-09-22 MED ORDER — APIXABAN 5 MG PO TABS
5.0000 mg | ORAL_TABLET | Freq: Two times a day (BID) | ORAL | Status: DC
  Administered 2024-09-22: 11:00:00 5 mg via ORAL
  Filled 2024-09-22: qty 1

## 2024-09-22 NOTE — ED Provider Notes (Cosign Needed)
 [place] EMERGENCY DEPARTMENT AT [facility] Provider Note   CSN: [id number] Arrival date & time: 09/21/24  1642     Patient presents with: Leg Swelling (/) and Atrial Fibrillation   Jenna [name] is a 83 y.o. female.   Atrial Fibrillation Associated symptoms include shortness of breath.  Patient is a 83 year old female presenting today for concerns for bilateral leg swelling and shortness of breath, seen by PCP yesterday and was told to come to the emergency department for concerns for possible heart failure exacerbation versus atrial fibrillation.  Notably was seen last night in triage where she was noted to be borderline tachycardic, having persistent shortness of breath.  Currently states that she has only missed her Eliquis , carvedilol  during her time in the ER.  But otherwise compliant with medications.  Reports that she was previously on Lasix  for heart failure but has not been on it for several years.  Previous medical history of CHF, A-fib, status post TAVR, type 2 diabetes, aortic stenosis, HTN.  Endorses urinary frequency.  Denies any recent long trips.  Denies fever, headache, vision changes, vertigo, chest pain, abdominal pain, nausea, vomiting, diarrhea, dysuria, hematuria, melena, hematochezia.      Prior to Admission medications  Medication Sig Start Date End Date Taking? Authorizing Provider  cephALEXin (KEFLEX) 500 MG capsule Take 1 capsule (500 mg total) by mouth 2 (two) times daily. 09/22/24  Yes Beola Terrall RAMAN, PA-C  furosemide  (LASIX ) 20 MG tablet Take 1 tablet (20 mg total) by mouth daily. 09/23/24 09/26/24 Yes Franklyn Sid SAILOR, MD  acetaminophen  (TYLENOL ) 500 MG tablet Take 1,000 mg by mouth every 6 (six) hours as needed for moderate pain.    [provider]  amoxicillin  (AMOXIL ) 500 MG tablet Take 4 tablets by mouth 1 hour before dental procedures and cleanings 11/09/21   Sebastian Lamarr SAUNDERS, PA-C  Calcium  Carb-Cholecalciferol (CALCIUM   600 + D PO) Take 1 tablet by mouth 2 (two) times daily with a meal.    [provider]  carvedilol  (COREG ) 25 MG tablet TAKE 1 TABLET BY MOUTH TWICE A DAY 08/11/24   Leverne Charlies Helling, PA-C  ELIQUIS  5 MG TABS tablet TAKE 1 TABLET BY MOUTH TWICE A DAY 07/09/24   Hilty, Vinie BROCKS, MD  levothyroxine  (SYNTHROID ) 75 MCG tablet Take 75 mcg by mouth daily before breakfast. 09/04/22   [provider]  Multiple Vitamins-Minerals (PRESERVISION AREDS 2) CAPS Take 1 capsule by mouth 2 (two) times daily.    [provider]    Allergies: Rosuvastatin, Amoxicillin , and Lovastatin    Review of Systems  Respiratory:  Positive for shortness of breath.   Cardiovascular:  Positive for leg swelling.  All other systems reviewed and are negative.   Updated Vital Signs BP (!) 177/104 (BP Location: Left Arm)   Pulse (!) 102   Temp (!) 97.5 F (36.4 C) (Oral)   Resp 19   Ht 5' 6 (1.676 m)   Wt 93.9 kg   SpO2 98%   BMI 33.41 kg/m   Physical Exam Vitals and nursing note reviewed.  Constitutional:      General: She is not in acute distress.    Appearance: Normal appearance. She is not ill-appearing or diaphoretic.  HENT:     Head: Normocephalic and atraumatic.  Eyes:     General: No scleral icterus.       Right eye: No discharge.        Left eye: No discharge.  Extraocular Movements: Extraocular movements intact.     Conjunctiva/sclera: Conjunctivae normal.  Cardiovascular:     Rate and Rhythm: Tachycardia present. Rhythm irregular.     Pulses: Normal pulses.     Heart sounds: Murmur (Chronic murmur noted) heard.     No friction rub. No gallop.  Pulmonary:     Effort: Pulmonary effort is normal. No respiratory distress.     Breath sounds: No stridor. No wheezing, rhonchi or rales.  Chest:     Chest wall: No tenderness.  Abdominal:     General: Abdomen is flat. There is no distension.     Palpations: Abdomen is soft.     Tenderness: There is no abdominal  tenderness. There is no right CVA tenderness, left CVA tenderness, guarding or rebound.  Musculoskeletal:        General: No swelling, deformity or signs of injury.     Cervical back: Normal range of motion. No rigidity.     Right lower leg: Edema present.     Left lower leg: Edema present.     Comments: Bilateral lower leg edema noted, noted to be grade 3 pitting.  Skin:    General: Skin is warm and dry.     Findings: No bruising, erythema or lesion.  Neurological:     General: No focal deficit present.     Mental Status: She is alert and oriented to person, place, and time. Mental status is at baseline.     Sensory: No sensory deficit.     Motor: No weakness.  Psychiatric:        Mood and Affect: Mood normal.     (all labs ordered are listed, but only abnormal results are displayed) Labs Reviewed  BASIC METABOLIC PANEL WITH GFR - Abnormal; Notable for the following components:      Result Value   Glucose, Bld 109 (*)    BUN 24 (*)    All other components within normal limits  PRO BRAIN NATRIURETIC PEPTIDE - Abnormal; Notable for the following components:   Pro Brain Natriuretic Peptide 2,052.0 (*)    All other components within normal limits  URINALYSIS, ROUTINE W REFLEX MICROSCOPIC - Abnormal; Notable for the following components:   APPearance CLOUDY (*)    Hgb urine dipstick TRACE (*)    Nitrite POSITIVE (*)    Leukocytes,Ua LARGE (*)    All other components within normal limits  URINALYSIS, MICROSCOPIC (REFLEX) - Abnormal; Notable for the following components:   Bacteria, UA MANY (*)    All other components within normal limits  URINE CULTURE  CBC  TROPONIN T, HIGH SENSITIVITY    EKG: EKG Interpretation Date/Time:  Tuesday September 21 2024 17:52:24 EST Ventricular Rate:  112 PR Interval:    QRS Duration:  92 QT Interval:  326 QTC Calculation: 444 R Axis:   -18  Text Interpretation: Atrial fibrillation with rapid ventricular response Anterior infarct , age  undetermined Confirmed by Franklyn Gills [phone]) on 09/22/2024 9:15:56 AM  Radiology: [facility] Chest 2 View Result Date: 09/21/2024 CLINICAL DATA:  Shortness of breath. EXAM: CHEST - 2 VIEW COMPARISON:  Chest radiograph dated 10/18/2022. FINDINGS: No focal consolidation, pleural effusion or pneumothorax. The cardiomegaly. Aortic valve repair and loop recorder device. No acute osseous pathology. IMPRESSION: 1. No active cardiopulmonary disease. 2. Cardiomegaly. Electronically Signed   By: Vanetta Chou M.D.   On: 09/21/2024 18:18   Procedures   Medications Ordered in the ED  apixaban  (ELIQUIS ) tablet 5 mg (5 mg Oral  Given 09/22/24 1055)  carvedilol  (COREG ) tablet 25 mg (has no administration in time range)  furosemide  (LASIX ) tablet 20 mg (20 mg Oral Given 09/22/24 1055)      Medical Decision Making Amount and/or Complexity of Data Reviewed Labs: ordered.  Risk Prescription drug management.   This patient is a 83 year old female who presents to the ED for concern of bilateral lower leg edema with shortness of breath, noted to open seen by PCP yesterday and told to come to the emergency department for concerns for possible A-fib with RVR versus acute decompensated heart failure.  Was seen in triage with labs done at that time last night, noting to have had persistent symptoms since last night.  Had not missed any dosing of her medications prior to the ER but having been here, noted to have missed both her carvedilol  and her Eliquis .  Was previously on Lasix  but has not taken it for several years according to patient.  On physical exam, patient is in no acute distress, afebrile, alert and orient x 4, speaking in full sentences, nontachypneic, nontachycardic.  LCTAB.  Noted to be mildly tachycardic with heart rate in the low 100s.  Bilateral lower leg edema noted to be grade 3+ pitting edema.  No erythema or signs of infection.  Unremarkable exam otherwise.  Patient overall well-appearing,  suspecting to be secondary heart failure exacerbation with patient's borderline tachycardia likely secondary to the fact that she had not taken her carvedilol  x 2 last night and this morning.  Patient additionally has not taken her Eliquis .  Patient ambulated without difficulty.  Provided Lasix , labetalol, Eliquis .  With attending, Dr. Franklyn is also seeing patient and agreeing that patient can be discharged after UA results and sent home on Lasix .  UA positive for UTI, urine culture pending.  Will send home with Keflex with patient notably having had cephalosporins in the past without difficulty.  Will send her home with following up with cardiology heart failure and PCP for UTI.  Patient vital signs have remained stable throughout the course of patient's time in the ED. Low suspicion for any other emergent pathology at this time. I believe this patient is safe to be discharged. Provided strict return to ER precautions. Patient and daughter expressed agreement and understanding of plan. All questions were answered.  Patient vital signs have remained stable throughout the course of patient's time in the ED. Low suspicion for any other emergent pathology at this time. I believe this patient is safe to be discharged. Provided strict return to ER precautions. Patient expressed agreement and understanding of plan. All questions were answered.  Differential diagnoses prior to evaluation: The emergent differential diagnosis includes, but is not limited to, atrial fibrillation with RVR, arrhythmia, PE, ACS, heart failure exacerbation, pneumonia, effusions, pneumothorax, cellulitis, DVT. This is not an exhaustive differential.   Past Medical History / Co-morbidities / Social History: CHF, A-fib, status post TAVR, type 2 diabetes, aortic stenosis, HTN.  Additional history: Chart reviewed. Pertinent results include:   Seen by PCP yesterday.  Last seen by cardiology 08/21/2023  Lab Tests/Imaging  studies: I personally interpreted labs/imaging and the pertinent results include:   CBC unremarkable BMP notes a mildly elevated BUN of 24 but otherwise unremarkable BNP noted to be 2052 Troponin and delta troponin unremarkable Chest x-ray shows cardiomegaly without any signs of effusions or any other acute process. UA positive for UTI, urine culture pending   I agree with the radiologist interpretation.  Cardiac monitoring: EKG obtained and  interpreted by myself and attending physician which shows:   EKG Interpretation Date/Time:  Tuesday September 21 2024 17:52:24 EST Ventricular Rate:  112 PR Interval:    QRS Duration:  92 QT Interval:  326 QTC Calculation: 444 R Axis:   -18  Text Interpretation: Atrial fibrillation with rapid ventricular response Anterior infarct , age undetermined Confirmed by Franklyn Gills [phone]) on 09/22/2024 9:15:56 AM          Medications: I ordered medication including lasix , carvedilol , eliquis .  I have reviewed the patients home medicines and have made adjustments as needed.  Critical Interventions: none  Social Determinants of Health: has good followup with PCP  Disposition: After consideration of the diagnostic results and the patients response to treatment, I feel that the patient would benefit from discharge and treatment noted as above.   emergency department workup does not suggest an emergent condition requiring admission or immediate intervention beyond what has been performed at this time. The plan is: lasix , followup with cardiology and PCP, return for any new or worsening Sx. The patient is safe for discharge and has been instructed to return immediately for worsening symptoms, change in symptoms or any other concerns.  Final diagnoses:  Bilateral lower extremity edema  Dyspnea on exertion  Atrial fibrillation, unspecified type [facility])    ED Discharge Orders          Ordered    furosemide  (LASIX ) 20 MG tablet  Daily         09/22/24 0924    cephALEXin (KEFLEX) 500 MG capsule  2 times daily        09/22/24 1134               Docia Klar S, PA-C 09/22/24 1136

## 2024-09-22 NOTE — ED Provider Notes (Incomplete)
 [place] EMERGENCY DEPARTMENT AT [facility] Provider Note   CSN: [id number] Arrival date & time: 09/21/24  1642     History {Add pertinent medical, surgical, social history, OB history to HPI:1} Chief Complaint  Patient presents with   Leg Swelling        Atrial Fibrillation    Jenna [name] is a 83 y.o. female with PMH as listed below who presents with ***.    Past Medical History:  Diagnosis Date   Anxiety    CHF (congestive heart failure) (HCC)    Chronic lower back pain    Chronic systolic dysfunction of left ventricle    Depression    Fatigue    GERD (gastroesophageal reflux disease)    High cholesterol    Hypertension    Hypothyroidism    Melanoma of shoulder, left (HCC)    Mild aortic stenosis    Nonischemic cardiomyopathy (HCC)    OSA on CPAP    reports compliance with CPAP   Osteoarthritis    Persistent atrial fibrillation with RVR (HCC)    thelbert 11/15/2016   PONV (postoperative nausea and vomiting)    RLS (restless legs syndrome)    S/P ablation of atrial flutter/ fib 01/10/17 01/11/2017   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 Verlin & Dr. Lucas   Type II diabetes mellitus (HCC)    no medications       Home Medications Prior to Admission medications  Medication Sig Start Date End Date Taking? Authorizing Provider  acetaminophen  (TYLENOL ) 500 MG tablet Take 1,000 mg by mouth every 6 (six) hours as needed for moderate pain.    [provider]  amoxicillin  (AMOXIL ) 500 MG tablet Take 4 tablets by mouth 1 hour before dental procedures and cleanings 11/09/21   Sebastian Lamarr SAUNDERS, PA-C  Calcium  Carb-Cholecalciferol (CALCIUM  600 + D PO) Take 1 tablet by mouth 2 (two) times daily with a meal.    [provider]  carvedilol  (COREG ) 25 MG tablet TAKE 1 TABLET BY MOUTH TWICE A DAY 08/11/24   Leverne Charlies Helling, PA-C  ELIQUIS  5 MG TABS tablet TAKE  1 TABLET BY MOUTH TWICE A DAY 07/09/24   Hilty, Vinie BROCKS, MD  levothyroxine  (SYNTHROID ) 75 MCG tablet Take 75 mcg by mouth daily before breakfast. 09/04/22   [provider]  Multiple Vitamins-Minerals (PRESERVISION AREDS 2) CAPS Take 1 capsule by mouth 2 (two) times daily.    [provider]      Allergies    Rosuvastatin, Amoxicillin , and Lovastatin    Review of Systems   Review of Systems A 10 point review of systems was performed and is negative unless otherwise reported in HPI.  Physical Exam Updated Vital Signs BP (!) 177/104 (BP Location: Left Arm)   Pulse (!) 102   Temp (!) 97.5 F (36.4 C) (Oral)   Resp 19   Ht 5' 6 (1.676 m)   Wt 93.9 kg   SpO2 98%   BMI 33.41 kg/m  Physical Exam General: Normal appearing {Desc; female/female:11659}, lying in bed.  HEENT: PERRLA, Sclera anicteric, MMM, trachea midline.  Cardiology: RRR, no murmurs/rubs/gallops. BL radial and DP pulses equal bilaterally.  Resp: Normal respiratory rate and effort. CTAB, no wheezes, rhonchi, crackles.  Abd: Soft, non-tender, non-distended. No rebound tenderness or guarding.  GU: Deferred. MSK: No peripheral edema or signs of trauma. Extremities without deformity or TTP.  No cyanosis or clubbing. Skin: warm, dry. No rashes or lesions. Back: No CVA tenderness Neuro: A&Ox4, CNs II-XII grossly intact. MAEs. Sensation grossly intact.  Psych: Normal mood and affect.   ED Results / Procedures / Treatments   Labs (all labs ordered are listed, but only abnormal results are displayed) Labs Reviewed  BASIC METABOLIC PANEL WITH GFR - Abnormal; Notable for the following components:      Result Value   Glucose, Bld 109 (*)    BUN 24 (*)    All other components within normal limits  PRO BRAIN NATRIURETIC PEPTIDE - Abnormal; Notable for the following components:   Pro Brain Natriuretic Peptide 2,052.0 (*)    All other components within normal limits  CBC  URINALYSIS, ROUTINE W REFLEX  MICROSCOPIC  TROPONIN T, HIGH SENSITIVITY    EKG EKG Interpretation Date/Time:  Tuesday September 21 2024 17:52:24 EST Ventricular Rate:  112 PR Interval:    QRS Duration:  92 QT Interval:  326 QTC Calculation: 444 R Axis:   -18  Text Interpretation: Atrial fibrillation with rapid ventricular response Anterior infarct , age undetermined Confirmed by Franklyn Gills [phone]) on 09/22/2024 9:15:56 AM  Radiology DG Chest 2 View Result Date: 09/21/2024 CLINICAL DATA:  Shortness of breath. EXAM: CHEST - 2 VIEW COMPARISON:  Chest radiograph dated 10/18/2022. FINDINGS: No focal consolidation, pleural effusion or pneumothorax. The cardiomegaly. Aortic valve repair and loop recorder device. No acute osseous pathology. IMPRESSION: 1. No active cardiopulmonary disease. 2. Cardiomegaly. Electronically Signed   By: Vanetta Chou M.D.   On: 09/21/2024 18:18    Procedures Procedures  {Document cardiac monitor, telemetry assessment procedure when appropriate:1}  Medications Ordered in ED Medications  furosemide  (LASIX ) tablet 20 mg (has no administration in time range)  apixaban  (ELIQUIS ) tablet 5 mg (has no administration in time range)  carvedilol  (COREG ) tablet 25 mg (has no administration in time range)    ED Course/ Medical Decision Making/ A&P                          Medical Decision Making Amount and/or Complexity of Data Reviewed Labs: ordered.    This patient presents to the ED for concern of ***, this involves an extensive number of treatment options, and is a complaint that carries with it a high risk of complications and morbidity.  I considered the following differential and admission for this acute, potentially life threatening condition.   MDM:    ***     Labs: I Ordered, and personally interpreted labs.  The pertinent results include:  ***  Imaging Studies ordered: I ordered imaging studies including *** I independently visualized and interpreted imaging. I  agree with the radiologist interpretation  Additional history obtained from ***.  External records from outside source obtained and reviewed including ***  Cardiac Monitoring: The patient was maintained on a cardiac monitor.  I personally viewed and interpreted the cardiac monitored which showed an underlying rhythm of: ***  Reevaluation: After the interventions noted above, I reevaluated the patient and found that they have :{resolved/improved/worsened:23923::improved}  Social Determinants of Health: ***  Disposition:  ***  Co morbidities that complicate the patient evaluation  Past Medical History:  Diagnosis Date   Anxiety    CHF (congestive heart failure) (HCC)    Chronic lower back pain    Chronic systolic dysfunction of left ventricle    Depression    Fatigue    GERD (gastroesophageal reflux disease)  High cholesterol    Hypertension    Hypothyroidism    Melanoma of shoulder, left (HCC)    Mild aortic stenosis    Nonischemic cardiomyopathy (HCC)    OSA on CPAP    reports compliance with CPAP   Osteoarthritis    Persistent atrial fibrillation with RVR (HCC)    thelbert 11/15/2016   PONV (postoperative nausea and vomiting)    RLS (restless legs syndrome)    S/P ablation of atrial flutter/ fib 01/10/17 01/11/2017   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 Verlin & Dr. Lucas   Type II diabetes mellitus (HCC)    no medications     Medicines Meds ordered this encounter  Medications   furosemide  (LASIX ) tablet 20 mg   apixaban  (ELIQUIS ) tablet 5 mg   carvedilol  (COREG ) tablet 25 mg    I have reviewed the patients home medicines and have made adjustments as needed  Problem List / ED Course: Problem List Items Addressed This Visit   None        {Document critical care time when appropriate:1} {Document review of labs and clinical decision tools ie heart score, Chads2Vasc2  etc:1}  {Document your independent review of radiology images, and any outside records:1} {Document your discussion with family members, caretakers, and with consultants:1} {Document social determinants of health affecting pt's care:1} {Document your decision making why or why not admission, treatments were needed:1}  This note was created using dictation software, which may contain spelling or grammatical errors.

## 2024-09-22 NOTE — Discharge Instructions (Addendum)
 You were seen today for bilateral lower leg swelling and shortness of breath which was likely secondary to worsening heart failure.  For this reason we are sending you home with some Lasix  to help you continue to diurese/urinate the excess fluid.  Additionally noting that your urine was notably positive for a UTI.  We are sending you home with antibiotics.  A urine culture is pending, they will reach out to you if another antibiotic is necessary to treat this infection.  You will need to continue to follow-up with your PCP for reevaluation for UTI and for GERD and additionally needing to follow-up with cardiology for reevaluation for heart failure.  Return to the ER if you have any new or worsening symptoms which would include worsening leg swelling, worsening shortness of breath with chest pain, fever, confusion.

## 2024-09-24 LAB — URINE CULTURE: Culture: >=100000 — AB

## 2024-09-25 ENCOUNTER — Telehealth (HOSPITAL_BASED_OUTPATIENT_CLINIC_OR_DEPARTMENT_OTHER): Payer: Self-pay | Admitting: *Deleted

## 2024-09-25 NOTE — Telephone Encounter (Signed)
 Post ED Visit - Positive Culture Follow-up  Culture report reviewed by antimicrobial stewardship pharmacist: [facility] []  Rankin Dee, Pharm.D. []  Venetia Gully, Pharm.D., BCPS AQ-ID []  Garrel Crews, Pharm.D., BCPS []  Almarie Lunger, Pharm.D., BCPS []  [place], [address].D., BCPS, AAHIVP []  Rosaline Bihari, Pharm.D., BCPS, AAHIVP []  Vernell Meier, PharmD, BCPS []  Latanya Hint, PharmD, BCPS []  Donald Medley, PharmD, BCPS []  Rocky Bold, PharmD []  Dorothyann Alert, PharmD, BCPS [x]  Dorn Buttner, PharmD  [facility] []  Rosaline Edison, PharmD []  Romona Bliss, PharmD []  Dolphus Roller, PharmD []  Veva Seip, Rph []  Vernell Daunt) Leonce, PharmD []  Eva Allis, PharmD []  Rosaline Millet, PharmD []  Iantha Batch, PharmD []  Arvin Gauss, PharmD []  Wanda Hasting, PharmD []  Ronal Rav, PharmD []  Rocky Slade, PharmD []  Bard Jeans, PharmD   Positive urine culture Treated with Cephalexin, organism sensitive to the same and no further patient follow-up is required at this time.  Albino Alan Novak 09/25/2024, 1:42 PM

## 2024-10-11 ENCOUNTER — Ambulatory Visit: Attending: Internal Medicine | Admitting: Internal Medicine

## 2024-10-11 VITALS — BP 154/89 | HR 94 | Ht 65.0 in | Wt 210.0 lb

## 2024-10-11 DIAGNOSIS — R600 Localized edema: Secondary | ICD-10-CM

## 2024-10-11 DIAGNOSIS — I4811 Longstanding persistent atrial fibrillation: Secondary | ICD-10-CM | POA: Diagnosis not present

## 2024-10-11 DIAGNOSIS — R0602 Shortness of breath: Secondary | ICD-10-CM

## 2024-10-11 DIAGNOSIS — I509 Heart failure, unspecified: Secondary | ICD-10-CM | POA: Diagnosis not present

## 2024-10-11 MED ORDER — FUROSEMIDE 40 MG PO TABS: 40.0000 mg | ORAL_TABLET | Freq: Every day | ORAL | 3 refills | Status: AC

## 2024-10-11 NOTE — Patient Instructions (Signed)
 Medication Instructions:   START furosemide  40mg  daily  *If you need a refill on your cardiac medications before your next appointment, please call your pharmacy*  Lab Work:  Non-Fasting lab work ~ 10/18/24 BMET, BNP  Lab locations below and also at: [facility] - [address]. 1st Floor [facility] [place] [address]     If you have labs (blood work) drawn today and your tests are completely normal, you will receive your results only by: MyChart Message (if you have MyChart) OR A paper copy in the mail If you have any lab test that is abnormal or we need to change your treatment, we will call you to review the results.  Testing/Procedures: Your physician has requested that you have an echocardiogram. Echocardiography is a painless test that uses sound waves to create images of your heart. It provides your doctor with information about the size and shape of your heart and how well your hearts chambers and valves are working. This procedure takes approximately one hour. There are no restrictions for this procedure. Please do NOT wear cologne, perfume, aftershave, or lotions (deodorant is allowed). Please arrive 15 minutes prior to your appointment time.  Please note: We ask at that you not bring children with you during ultrasound (echo/ vascular) testing. Due to room size and safety concerns, children are not allowed in the ultrasound rooms during exams. Our front office staff cannot provide observation of children in our lobby area while testing is being conducted. An adult accompanying a patient to their appointment will only be allowed in the ultrasound room at the discretion of the ultrasound technician under special circumstances. We apologize for any inconvenience.   Follow-Up: At [facility], you and your health needs are our priority.  As part of our continuing mission to provide you with exceptional heart care, our providers  are all part of one team.  This team includes your primary Cardiologist (physician) and Advanced Practice Providers or APPs (Physician Assistants and Nurse Practitioners) who all work together to provide you with the care you need, when you need it.  Your next appointment:    1 month with PA or NP (after echo)  We recommend signing up for the patient portal called MyChart.  Sign up information is provided on this After Visit Summary.  MyChart is used to connect with patients for Virtual Visits (Telemedicine).  Patients are able to view lab/test results, encounter notes, upcoming appointments, etc.  Non-urgent messages can be sent to your provider as well.   To learn more about what you can do with MyChart, go to [email or web address].   Other Instructions

## 2024-10-11 NOTE — Progress Notes (Signed)
 "   OFFICE NOTE  Chief Complaint:   Follow-up recent ER visit  Primary Care Physician: Chinita Hoy CROME, PA-C  HPI:  Jenna [name] is a 84 year old female with a history of mild aortic stenosis and a negative nuclear stress test in August 2013. Recently she had an episode of palpitations which she felt lasted up to about 10 minutes. During that time she felt a decrease in exercise tolerance with fatigue and no energy. This was concerning for AFib; however, she wore a monitor for a short period of time and it did not show that.  I set her up for a 30-day monitor, which she worse very faithfully between February 26 and December 31, 2012. The results indicated 1 very brief episode of AFib of less than 10 seconds in duration out of a total of over 40,000 seconds of monitoring. This correlates with a burden of less than 0.003%. At this point, given her low burden I feel that it is still reasonable to treat her with low-dose aspirin . However, we will continue to see her closely and adjust her anticoagulation as necessary. Since her last follow-up she denies any significant palpitations. She has reported some LE swelling, which she thinks is better now that she is off of her ARB?  Jenna [name] returns today for followup. She reports doing fairly well. In August she said she had an episode of age of fibrillation when coming home from the beach. That day she walked up and down the stairs several times and felt her heart racing. It never seemed to slow down. Finally she felt better the next day however she was fatigued and eventually recovered. Since that time she's had no further events. She does get some mild swelling in her legs. She is reporting some fatigue since starting a beta blocker and feels that it may be a side effect.  I saw Jenna [name] back today in the office. She seems to doing fairly well from a cardiac standpoint. She denies any significant palpitations and thinks that the diltiazem  is generally  controlled them. If she is having A. fib is very infrequent and a low burden. She is maintained only on aspirin . Unfortunate she's been struggling with depression which occurred after grieving for the death of her husband. She's been apparently on several different antidepressive medicines with mixed results and recently on Wellbutrin  which she thinks is somewhat helpful. She did have somewhat of a flat affect in the office today.  07/08/2016  Jenna [name] returns today for follow-up. She is doing generally well and denies any palpitations of significance. EKG today shows sinus rhythm at 72. Her blood pressure is mildly elevated 141/79. Weight is up a few pounds. She continues to struggle with depression related to the death of her husband which is now a couple of years ago.  05/13/2017  Jenna [name] returns today for follow-up. Recently she was hospitalized in March with A. fib with RVR and symptoms of heart failure. Her diuretics were increased and she underwent cardioversion. This was initially successful, however ultimately she went back into A. fib. She was seen by EP and was placed on amiodarone  and Ranexa . She was later evaluated and deemed a candidate for ablation. I performed a cardiac transesophageal echocardiogram which demonstrated an LVEF of 20-25% and no evidence of LV thrombus. Subsequently she underwent catheter ablation with successful conversion to sinus rhythm. Since that time she is maintaining sinus rhythm and a repeat echocardiogram on 04/22/2017 demonstrates normalization of LV function to  an LVEF of 60-65%. She reports that she's been feeling well. She's been worked with her primary care provider who recently obtained labs and indicated she may be a little dehydrated. She was on high-dose Lasix  80 mg daily daily which was reduced to 40 mg alternating with 80 mg every other day. She denies any worsening shortness of breath or chest pain. Blood pressure is at goal today. Her weight is been stable.  She is compliant with Eliquis  for anticoagulation and denies any bleeding problems.  06/22/2018  Jenna [name] returns today for follow-up.  She reports some recent increase in swelling.  She denies any recurrent A. fib.  She was seen in the A. fib clinic in July and had not noted to have any A. fib at the time.  Fortunately her LVEF had normalized after ablation.  She denies any chest pain.  She continues to struggle with hip pain.  She says she has arthritis which is bone-on-bone.  01/10/2020  Jenna [name] returns today for follow-up.  She has been followed in the A. fib clinic and has had good control of her A. fib after ablation however more recently had worsening palpitations.  She saw Dr. Kelsie and just had an implanted loop recorder placed a week ago.  Is a remote pacer check later this month.  In November she had a repeat echocardiogram which showed moderate to severe aortic stenosis with normal LV function.  She does report dyspnea on exertion, particularly walking upstairs or going long distances.  This seems to have worsened somewhat.  Another echo has been ordered for early May 2021.  EKG today shows sinus rhythm at 69.  09/05/2020  Jenna [name] is seen today in follow-up.  She has been seen by number of my physician assistants over the past several months as well as Dr. Kelsie back in September.  After ablation she has had no significant recurrent atrial fibrillation.  He even discussed with her about possibly stopping her anticoagulation but she is reluctant to do that.  She does seem somewhat anxious about her cardiac health.  We did an echo in May of this year which showed normal systolic function and moderate aortic stenosis with mild aortic insufficiency.  This is been fairly stable.  Blood pressure is good today.  EKG shows a sinus rhythm with sinus arrhythmia at 95.  02/15/2021  Jenna [name] returns today for follow-up.  Overall she is feeling well.  She denies any worsening chest pain or shortness of breath.   She did have a repeat echo however which showed progression of her aortic stenosis.  This seems to have increased from moderate to now moderate to severe.  Her mean gradient is increased from 24 to 34 mmHg.  LVEF is normal.  She seems to have had no recurrent atrial fibrillation.  Her loop recorder demonstrates no arrhythmias however Dr. Kelsie recommended lifelong anticoagulation.  Blood pressures well controlled today.  EKG shows a normal sinus rhythm.  10/11/2024  Jenna [name] returns today for follow-up.  It has been a number of years since I saw her.  Unfortunately she was recently in the emergency department.  She was sent in for bilateral lower extremity edema.  There was concern about heart failure given her history.  She was not admitted but given several days of diuretics which she completed but then ran out of medication.  She lives alone but appears to be somewhat disheveled compared to when I have seen her in the past.  Her daughter was with her  today and says that she checks on her several times a week as well as her brother who recently had hip surgery.  Ms. Porter is somewhat withdrawn today and does have a history of depression.  She has been resistant of therapies.  I am also concerned about memory issues.  She is using a walker to get around.  She expressed hesitation about being able to do some of her activities of daily living.  When I discussed that with her and her daughter in the office, I also suggested that she may need an alternative living situation such as in-home care or aide or perhaps being in an assisted living facility.  She does report some shortness of breath with exertion and has notable lower extremity swelling.  PMHx:  Past Medical History:  Diagnosis Date   Anxiety    CHF (congestive heart failure) (HCC)    Chronic lower back pain    Chronic systolic dysfunction of left ventricle    Depression    Fatigue    GERD (gastroesophageal reflux disease)    High cholesterol     Hypertension    Hypothyroidism    Melanoma of shoulder, left (HCC)    Mild aortic stenosis    Nonischemic cardiomyopathy (HCC)    OSA on CPAP    reports compliance with CPAP   Osteoarthritis    Persistent atrial fibrillation with RVR (HCC)    thelbert 11/15/2016   PONV (postoperative nausea and vomiting)    RLS (restless legs syndrome)    S/P ablation of atrial flutter/ fib 01/10/17 01/11/2017   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 Verlin & Dr. Lucas   Type II diabetes mellitus (HCC)    no medications    Past Surgical History:  Procedure Laterality Date   ATRIAL FIBRILLATION ABLATION N/A 01/10/2017   Procedure: Atrial Fibrillation Ablation;  Surgeon: Lynwood Rakers, MD;  [facility] INVASIVE CV LAB;  Service: Cardiovascular;  Laterality: N/A;   CARDIOVERSION N/A 11/19/2016   Procedure: CARDIOVERSION;  Surgeon: Redell GORMAN Shallow, MD;  Location: [facility] ENDOSCOPY;  Service: Cardiovascular;  Laterality: N/A;   CARDIOVERSION N/A 12/11/2016   Procedure: CARDIOVERSION;  Surgeon: Leim VEAR Moose, MD;  Location: [facility] ENDOSCOPY;  Service: Cardiovascular;  Laterality: N/A;   CARDIOVERSION N/A 12/18/2016   Procedure: CARDIOVERSION;  Surgeon: Annabella Scarce, MD;  Location: [facility] ENDOSCOPY;  Service: Cardiovascular;  Laterality: N/A;   CARDIOVERSION N/A 10/23/2022   Procedure: CARDIOVERSION;  Surgeon: Jeffrie Oneil BROCKS, MD;  Location: [facility] ENDOSCOPY;  Service: Cardiovascular;  Laterality: N/A;   CATARACT EXTRACTION W/ INTRAOCULAR LENS  IMPLANT, BILATERAL Bilateral    DILATION AND CURETTAGE OF UTERUS     implantable loop recorder placement  12/27/2019   Medtronic Reveal Linq model LNQ 11 (SN [id number]) implanted in office by Dr Rakers   INTRAOPERATIVE TRANSTHORACIC ECHOCARDIOGRAM N/A 10/30/2021   Procedure: INTRAOPERATIVE TRANSTHORACIC ECHOCARDIOGRAM;  Surgeon: Verlin Lonni BIRCH, MD;  Location: [facility] INVASIVE CV LAB;  Service:  Open Heart Surgery;  Laterality: N/A;   LEXISCAN MYOVIEW  05/13/2012   No ECG changes. EKG negative for ischemia. No significant ischemia demonstrated.   MELANOMA EXCISION Left ~ 09/1979   back of my shoulder   RIGHT/LEFT HEART CATH AND CORONARY ANGIOGRAPHY N/A 09/17/2021   Procedure: RIGHT/LEFT HEART CATH AND CORONARY ANGIOGRAPHY;  Surgeon: Verlin Lonni BIRCH, MD;  Location: [facility] INVASIVE CV LAB;  Service: Cardiovascular;  Laterality: N/A;  TEE WITHOUT CARDIOVERSION N/A 11/19/2016   Procedure: TRANSESOPHAGEAL ECHOCARDIOGRAM (TEE);  Surgeon: Redell GORMAN Shallow, MD;  Location: [facility] ENDOSCOPY;  Service: Cardiovascular;  Laterality: N/A;  need CV, but no anesthesia available   TEE WITHOUT CARDIOVERSION N/A 01/10/2017   Procedure: TRANSESOPHAGEAL ECHOCARDIOGRAM (TEE);  Surgeon: Vinie JAYSON Maxcy, MD;  Location: [facility] ENDOSCOPY;  Service: Cardiovascular;  Laterality: N/A;   TOTAL KNEE ARTHROPLASTY Right 07/28/2018   Procedure: RIGHT TOTAL KNEE ARTHROPLASTY;  Surgeon: Vernetta Lonni GRADE, MD;  Location: [facility] OR;  Service: Orthopedics;  Laterality: Right;   TRANSCATHETER AORTIC VALVE REPLACEMENT, TRANSFEMORAL N/A 10/30/2021   Procedure: TRANSCATHETER AORTIC VALVE REPLACEMENT, TRANSFEMORAL;  Surgeon: Verlin Lonni BIRCH, MD;  Location: [facility] INVASIVE CV LAB;  Service: Open Heart Surgery;  Laterality: N/A;   TRANSTHORACIC ECHOCARDIOGRAM  02/18/2012   EF >55%, mild aortic stenosis   VAGINAL HYSTERECTOMY  1985    FAMHx:  Family History  Problem Relation Age of Onset   Hypertension Mother    Diabetes Mother    Heart failure Father    Arrhythmia Sister    Hyperlipidemia Sister    Hypertension Sister    Healthy Brother    Stroke Paternal Grandmother    Cancer Paternal Grandfather    Healthy Daughter    Hypertension Son    Hyperlipidemia Son     SOCHx:   reports that she has never smoked. She has never used smokeless tobacco. She reports that she does not drink alcohol  and does not use  drugs.  ALLERGIES:  Allergies  Allergen Reactions   Rosuvastatin Other (See Comments)    Joints ached  Joints ached  Joints ached  Other reaction(s): joints ache   Amoxicillin  Nausea Only    850 mg dosage  Other Reaction(s): GI Intolerance   Lovastatin Other (See Comments)    Joints hurt all over body     ROS: Pertinent items noted in HPI and remainder of comprehensive ROS otherwise negative.  HOME MEDS: Current Outpatient Medications  Medication Sig Dispense Refill   acetaminophen  (TYLENOL ) 500 MG tablet Take 1,000 mg by mouth every 6 (six) hours as needed for moderate pain.     Calcium  Carb-Cholecalciferol (CALCIUM  600 + D PO) Take 1 tablet by mouth 2 (two) times daily with a meal.     carvedilol  (COREG ) 25 MG tablet TAKE 1 TABLET BY MOUTH TWICE A DAY 180 tablet 0   ELIQUIS  5 MG TABS tablet TAKE 1 TABLET BY MOUTH TWICE A DAY 180 tablet 1   levothyroxine  (SYNTHROID ) 75 MCG tablet Take 75 mcg by mouth daily before breakfast.     Multiple Vitamins-Minerals (PRESERVISION AREDS 2) CAPS Take 1 capsule by mouth 2 (two) times daily.     No current facility-administered medications for this visit.    LABS/IMAGING: No results found for this or any previous visit (from the past 48 hours). No results found.  VITALS: BP (!) 154/89 (BP Location: Right Arm)   Pulse 94   Ht 5' 5 (1.651 m)   Wt 210 lb (95.3 kg)   SpO2 97%   BMI 34.95 kg/m   EXAM: General appearance: alert and no distress Neck: no carotid bruit, no JVD and thyroid not enlarged, symmetric, no tenderness/mass/nodules Lungs: clear to auscultation bilaterally Heart: regular rate and rhythm, S1, S2 normal and systolic murmur: late systolic 3/6, crescendo at 2nd right intercostal space Abdomen: soft, non-tender; bowel sounds normal; no masses,  no organomegaly Extremities: extremities normal, atraumatic, no cyanosis or edema Pulses: 2+ and symmetric Skin: Skin  color, texture, turgor normal. No rashes or  lesions Neurologic: Grossly normal Psych: Pleasant  EKG: Deferred-recent ER EKG noted demonstrating atrial fibrillation.  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  Mrs. Deshotels was recently in the ER with worsening lower extremity swelling.  I am concerned about medication compliance and her ability to care for self.  She was accompanied by her daughter today and she appeared withdrawn and possibly depressed.  She also appears unkempt compared to when I have seen her in the past.  I discussed my concerns to her daughter and the fact that I think they may need to consider help on a more full-time basis in the home or perhaps it is time to discuss an assisted living situation.  If this is not significant depression and there may be memory loss as well.  She does have significant lower extremity edema.  Will plan a repeat echo and I advised her to start on Lasix  40 mg daily.  She will need lab work in about a week and close follow-up with APP.  Vinie KYM Maxcy, MD, [facility], FNLA, FACP  [place]  [facility]  Medical Director of the [facility] Lipid Disorders &  Cardiovascular Risk Reduction Clinic Diplomate of the American Board of Clinical Lipidology Attending Cardiologist  Direct Dial: [phone]  Fax: [phone]  Website:  [email or web address] Vinie BROCKS Jamilla Galli 10/11/2024, 3:18 PM "

## 2024-10-24 LAB — BASIC METABOLIC PANEL WITH GFR
BUN/Creatinine Ratio: 24 (ref 12–28)
BUN: 28 mg/dL — ABNORMAL HIGH (ref 8–27)
CO2: 26 mmol/L (ref 20–29)
Calcium: 10.2 mg/dL (ref 8.7–10.3)
Chloride: 98 mmol/L (ref 96–106)
Creatinine, Ser: 1.15 mg/dL — ABNORMAL HIGH (ref 0.57–1.00)
Glucose: 112 mg/dL — ABNORMAL HIGH (ref 70–99)
Potassium: 4.7 mmol/L (ref 3.5–5.2)
Sodium: 139 mmol/L (ref 134–144)
eGFR: 48 mL/min/1.73 — ABNORMAL LOW

## 2024-10-24 LAB — BRAIN NATRIURETIC PEPTIDE: BNP: 223.4 pg/mL — ABNORMAL HIGH (ref 0.0–100.0)

## 2024-10-25 ENCOUNTER — Ambulatory Visit: Payer: Self-pay | Admitting: Internal Medicine

## 2024-10-25 DIAGNOSIS — I509 Heart failure, unspecified: Secondary | ICD-10-CM

## 2024-10-25 DIAGNOSIS — Z79899 Other long term (current) drug therapy: Secondary | ICD-10-CM

## 2024-11-06 ENCOUNTER — Other Ambulatory Visit: Payer: Self-pay | Admitting: Physician Assistant

## 2024-11-09 ENCOUNTER — Ambulatory Visit (HOSPITAL_COMMUNITY)

## 2024-11-10 ENCOUNTER — Ambulatory Visit (HOSPITAL_COMMUNITY): Admission: RE | Admit: 2024-11-10 | Source: Ambulatory Visit

## 2024-11-24 ENCOUNTER — Ambulatory Visit: Admitting: Emergency Medicine

## 2024-12-09 ENCOUNTER — Ambulatory Visit (HOSPITAL_COMMUNITY)

## 2024-12-17 ENCOUNTER — Ambulatory Visit: Payer: Medicare PPO

## 2025-01-06 ENCOUNTER — Ambulatory Visit: Admitting: Emergency Medicine

## 2025-03-18 ENCOUNTER — Ambulatory Visit: Payer: Medicare PPO

## 2025-06-17 ENCOUNTER — Ambulatory Visit: Payer: Medicare PPO

## 2025-09-16 ENCOUNTER — Ambulatory Visit: Payer: Medicare PPO
